# Patient Record
Sex: Female | Born: 1970 | Race: Black or African American | Hispanic: No | Marital: Single | Smoking: Current every day smoker
Health system: Southern US, Community
[De-identification: ages and names within clinical notes are randomized; demographics above are authoritative.]

## PROBLEM LIST (undated history)

## (undated) DIAGNOSIS — A539 Syphilis, unspecified: Secondary | ICD-10-CM | POA: Insufficient documentation

## (undated) DIAGNOSIS — M545 Low back pain, unspecified: Secondary | ICD-10-CM

## (undated) DIAGNOSIS — M1990 Unspecified osteoarthritis, unspecified site: Secondary | ICD-10-CM

## (undated) DIAGNOSIS — K439 Ventral hernia without obstruction or gangrene: Secondary | ICD-10-CM

## (undated) DIAGNOSIS — R569 Unspecified convulsions: Secondary | ICD-10-CM

## (undated) DIAGNOSIS — E119 Type 2 diabetes mellitus without complications: Secondary | ICD-10-CM

## (undated) DIAGNOSIS — G8929 Other chronic pain: Secondary | ICD-10-CM

## (undated) DIAGNOSIS — F1290 Cannabis use, unspecified, uncomplicated: Secondary | ICD-10-CM

## (undated) DIAGNOSIS — Z8659 Personal history of other mental and behavioral disorders: Secondary | ICD-10-CM

## (undated) DIAGNOSIS — D649 Anemia, unspecified: Secondary | ICD-10-CM

## (undated) DIAGNOSIS — F319 Bipolar disorder, unspecified: Secondary | ICD-10-CM

## (undated) DIAGNOSIS — G894 Chronic pain syndrome: Secondary | ICD-10-CM

## (undated) DIAGNOSIS — Z720 Tobacco use: Secondary | ICD-10-CM

## (undated) DIAGNOSIS — L732 Hidradenitis suppurativa: Secondary | ICD-10-CM

## (undated) DIAGNOSIS — Z9151 Personal history of suicidal behavior: Secondary | ICD-10-CM

## (undated) DIAGNOSIS — K219 Gastro-esophageal reflux disease without esophagitis: Secondary | ICD-10-CM

## (undated) DIAGNOSIS — Z87442 Personal history of urinary calculi: Secondary | ICD-10-CM

## (undated) DIAGNOSIS — Z046 Encounter for general psychiatric examination, requested by authority: Secondary | ICD-10-CM

## (undated) HISTORY — DX: Other chronic pain: G89.29

## (undated) HISTORY — PX: TUBAL LIGATION: SHX77

## (undated) HISTORY — PX: HYDRADENITIS EXCISION: SHX5243

## (undated) HISTORY — PX: DILATION AND CURETTAGE OF UTERUS: SHX78

## (undated) HISTORY — DX: Low back pain, unspecified: M54.50

---

## 2014-02-16 ENCOUNTER — Emergency Department: Payer: Self-pay | Admitting: Emergency Medicine

## 2014-02-20 ENCOUNTER — Emergency Department: Payer: Self-pay | Admitting: Emergency Medicine

## 2014-03-02 ENCOUNTER — Emergency Department: Payer: Self-pay | Admitting: Emergency Medicine

## 2014-03-16 ENCOUNTER — Emergency Department: Payer: Self-pay | Admitting: Emergency Medicine

## 2014-03-19 ENCOUNTER — Emergency Department: Payer: Self-pay | Admitting: Emergency Medicine

## 2014-03-21 ENCOUNTER — Emergency Department: Payer: Self-pay | Admitting: Emergency Medicine

## 2014-03-29 ENCOUNTER — Emergency Department: Payer: Self-pay | Admitting: Emergency Medicine

## 2014-04-04 ENCOUNTER — Emergency Department: Payer: Self-pay | Admitting: Emergency Medicine

## 2014-04-19 ENCOUNTER — Emergency Department: Payer: Self-pay | Admitting: Emergency Medicine

## 2014-04-26 ENCOUNTER — Emergency Department: Payer: Self-pay | Admitting: Internal Medicine

## 2014-05-06 ENCOUNTER — Emergency Department: Payer: Self-pay | Admitting: Emergency Medicine

## 2014-06-24 ENCOUNTER — Emergency Department: Payer: Self-pay | Admitting: Emergency Medicine

## 2014-07-09 ENCOUNTER — Emergency Department: Payer: Self-pay | Admitting: Student

## 2014-07-28 ENCOUNTER — Emergency Department: Payer: Self-pay | Admitting: Emergency Medicine

## 2014-08-04 ENCOUNTER — Emergency Department: Payer: Self-pay | Admitting: Emergency Medicine

## 2014-08-24 ENCOUNTER — Emergency Department: Payer: Self-pay | Admitting: Internal Medicine

## 2014-08-28 LAB — WOUND CULTURE

## 2014-11-11 ENCOUNTER — Emergency Department: Payer: Self-pay | Admitting: Emergency Medicine

## 2014-12-02 ENCOUNTER — Emergency Department
Admit: 2014-12-02 | Disposition: A | Discharge status: Home / Self Care | Payer: Self-pay | Admitting: Emergency Medicine

## 2014-12-08 LAB — WOUND CULTURE

## 2014-12-22 ENCOUNTER — Emergency Department
Admit: 2014-12-22 | Disposition: A | Discharge status: Home / Self Care | Payer: Self-pay | Admitting: Emergency Medicine

## 2015-01-22 ENCOUNTER — Other Ambulatory Visit: Payer: Self-pay

## 2015-01-22 ENCOUNTER — Emergency Department
Admission: EM | Admit: 2015-01-22 | Discharge: 2015-01-22 | Disposition: A | Discharge status: Home / Self Care | Payer: Medicaid Other | Attending: Emergency Medicine | Admitting: Emergency Medicine

## 2015-01-22 DIAGNOSIS — R079 Chest pain, unspecified: Secondary | ICD-10-CM | POA: Diagnosis present

## 2015-01-22 LAB — CBC
HCT: 36.8 % (ref 35.0–47.0)
Hemoglobin: 12.1 g/dL (ref 12.0–16.0)
MCH: 28.9 pg (ref 26.0–34.0)
MCHC: 32.8 g/dL (ref 32.0–36.0)
MCV: 87.9 fL (ref 80.0–100.0)
PLATELETS: 256 10*3/uL (ref 150–440)
RBC: 4.19 MIL/uL (ref 3.80–5.20)
RDW: 14.1 % (ref 11.5–14.5)
WBC: 4.6 10*3/uL (ref 3.6–11.0)

## 2015-01-22 LAB — BASIC METABOLIC PANEL
ANION GAP: 6 (ref 5–15)
BUN: 21 mg/dL — ABNORMAL HIGH (ref 6–20)
CHLORIDE: 108 mmol/L (ref 101–111)
CO2: 27 mmol/L (ref 22–32)
Calcium: 9.3 mg/dL (ref 8.9–10.3)
Creatinine, Ser: 0.92 mg/dL (ref 0.44–1.00)
GFR calc Af Amer: >60 mL/min (ref >60)
GLUCOSE: 86 mg/dL (ref 65–99)
Potassium: 3.3 mmol/L — ABNORMAL LOW (ref 3.5–5.1)
Sodium: 141 mmol/L (ref 135–145)

## 2015-01-22 LAB — TROPONIN I: Troponin I: <0.03 ng/mL (ref <0.031)

## 2015-01-22 MED ORDER — ACETAMINOPHEN 325 MG PO TABS: 650.0000 mg | ORAL_TABLET | Freq: Once | ORAL | Status: DC

## 2015-01-22 MED ORDER — GI COCKTAIL ~~LOC~~: 30.0000 mL | Freq: Once | ORAL | Status: DC

## 2015-01-22 MED ORDER — GI COCKTAIL ~~LOC~~
ORAL | Status: AC
  Filled 2015-01-22: qty 30

## 2015-01-22 MED ORDER — ACETAMINOPHEN 325 MG PO TABS
ORAL_TABLET | ORAL | Status: AC
  Filled 2015-01-22: qty 2

## 2015-01-22 NOTE — ED Provider Notes (Signed)
[facility] Emergency Department Provider Note   ____________________________________________  Time seen: EMS arrival  I have reviewed the triage vital signs and the nursing notes.   HISTORY  Chief Complaint Chest Pain   History limited by: Not Limited   HPI Karina Anderson is a 44 y.o. female presents to the emergency department via EMS today because of chest pain. Patient states that this pain started upon awakening this morning. She states that it has been constant since then. The pain is located retrosternally. It is sharp in nature. Patient rates the pain as a 10.5 on the 0-10 scale. Patient denies any radiation. Patient denies any shortness of breath. Patient denies any diaphoresis.Patient refused aspiring by EMS.     No past medical history on file.  There are no active problems to display for this patient.   No past surgical history on file.  No current outpatient prescriptions on file.  Allergies Review of patient's allergies indicates not on file.  No family history on file.  Social History History  Substance Use Topics  . Smoking status: Not on file  . Smokeless tobacco: Not on file  . Alcohol Use: Not on file    Review of Systems  Constitutional: Negative for fever. Cardiovascular: Positive for chest pain. Respiratory: Negative for shortness of breath. Gastrointestinal: Negative for abdominal pain, vomiting and diarrhea. Genitourinary: Negative for dysuria. Musculoskeletal: Negative for back pain. Skin: Negative for rash. Neurological: Negative for headaches, focal weakness or numbness.  10-point ROS otherwise negative.  ____________________________________________   PHYSICAL EXAM:   Constitutional: Alert and oriented. Well appearing and in no distress. Eyes: Conjunctivae are normal. PERRL. Normal extraocular movements. ENT   Head: Normocephalic and atraumatic.   Nose: No congestion/rhinnorhea.    Mouth/Throat: Mucous membranes are moist.   Neck: No stridor. Hematological/Lymphatic/Immunilogical: No cervical lymphadenopathy. Cardiovascular: Normal rate, regular rhythm.  No murmurs, rubs, or gallops. Respiratory: Normal respiratory effort without tachypnea nor retractions. Breath sounds are clear and equal bilaterally. No wheezes/rales/rhonchi. Gastrointestinal: Soft and nontender. No distention. Genitourinary: Deferred Musculoskeletal: Normal range of motion in all extremities. No joint effusions.  No lower extremity tenderness nor edema. Neurologic:  Normal speech and language. No gross focal neurologic deficits are appreciated. Speech is normal.  Skin:  Skin is warm, dry and intact. No rash noted. Psychiatric: Mood and affect are normal. Speech and behavior are normal. Patient exhibits appropriate insight and judgment.  ____________________________________________    LABS (pertinent positives/negatives)  Labs Reviewed  BASIC METABOLIC PANEL - Abnormal; Notable for the following:    Potassium 3.3 (*)    BUN 21 (*)    All other components within normal limits  CBC  TROPONIN I     ____________________________________________   EKG  I, Phineas Semen, attending physician, personally viewed and interpreted this EKG  EKG Time: 1625 Rate: 56 Rhythm: Sinus bradycardia Axis: Normal Intervals: QTc 405 QRS: Normal ST changes: No ST elevation   ____________________________________________    RADIOLOGY  None  ____________________________________________   PROCEDURES  Procedure(s) performed: None  Critical Care performed: No  ____________________________________________   INITIAL IMPRESSION / ASSESSMENT AND PLAN / ED COURSE  Pertinent labs & imaging results that were available during my care of the patient were reviewed by me and considered in my medical decision making (see chart for details).  She presented today from EMS because of substernal chest  pain. On exam patient. Very comfortable. EKG without any concerning findings. Additionally blood work and physical exam without any concerning findings.  Patient did refuse multiple medications both by EMS as well as by myself. I tried to discuss with patient that these medications were intended to make her feel better. However patient was insistent that it was not coming from her stomach and her sopping us and that it was her heart. I tried to explain to patient the reassuring nature of her EKG and blood work.  ____________________________________________   FINAL CLINICAL IMPRESSION(S) / ED DIAGNOSES  Final diagnoses:  Chest pain, unspecified chest pain type     Phineas SemenGraydon Kito Cuffe, MD 01/22/15 1842

## 2015-01-22 NOTE — ED Notes (Signed)
In to give pt meds. Pt refused meds at this time. Pt keeps stating that its chest pain.  It was explained to pt that sometimes when you have acid reflux it can make your chest hurt. Pt still refused to take meds and started yelling at staff that we were going to let her died. Provider made aware that pt was refusing meds.

## 2015-01-22 NOTE — ED Notes (Signed)
Pt here by EMS with CP that started this am when she got out of bed.  Pt states that has had some vomiting.Pt states that the pain is better when she lefts her right arm over her head. Pt was placed on the monitor and pt is in NAD at this time. EMS attempted to give the pt ASA and pt refused.

## 2015-01-22 NOTE — Discharge Instructions (Signed)
Please seek medical attention for any high fevers, chest pain, shortness of breath, change in behavior, persistent vomiting, bloody stool or any other new or concerning symptoms. ° °Chest Pain (Nonspecific) °It is often hard to give a specific diagnosis for the cause of chest pain. There is always a chance that your pain could be related to something serious, such as a heart attack or a blood clot in the lungs. You need to follow up with your health care provider for further evaluation. °CAUSES  °· Heartburn. °· Pneumonia or bronchitis. °· Anxiety or stress. °· Inflammation around your heart (pericarditis) or lung (pleuritis or pleurisy). °· A blood clot in the lung. °· A collapsed lung (pneumothorax). It can develop suddenly on its own (spontaneous pneumothorax) or from trauma to the chest. °· Shingles infection (herpes zoster virus). °The chest wall is composed of bones, muscles, and cartilage. Any of these can be the source of the pain. °· The bones can be bruised by injury. °· The muscles or cartilage can be strained by coughing or overwork. °· The cartilage can be affected by inflammation and become sore (costochondritis). °DIAGNOSIS  °Lab tests or other studies may be needed to find the cause of your pain. Your health care provider may have you take a test called an ambulatory electrocardiogram (ECG). An ECG records your heartbeat patterns over a 24-hour period. You may also have other tests, such as: °· Transthoracic echocardiogram (TTE). During echocardiography, sound waves are used to evaluate how blood flows through your heart. °· Transesophageal echocardiogram (TEE). °· Cardiac monitoring. This allows your health care provider to monitor your heart rate and rhythm in real time. °· Holter monitor. This is a portable device that records your heartbeat and can help diagnose heart arrhythmias. It allows your health care provider to track your heart activity for several days, if needed. °· Stress tests by  exercise or by giving medicine that makes the heart beat faster. °TREATMENT  °· Treatment depends on what may be causing your chest pain. Treatment may include: °¨ Acid blockers for heartburn. °¨ Anti-inflammatory medicine. °¨ Pain medicine for inflammatory conditions. °¨ Antibiotics if an infection is present. °· You may be advised to change lifestyle habits. This includes stopping smoking and avoiding alcohol, caffeine, and chocolate. °· You may be advised to keep your head raised (elevated) when sleeping. This reduces the chance of acid going backward from your stomach into your esophagus. °Most of the time, nonspecific chest pain will improve within 2-3 days with rest and mild pain medicine.  °HOME CARE INSTRUCTIONS  °· If antibiotics were prescribed, take them as directed. Finish them even if you start to feel better. °· For the next few days, avoid physical activities that bring on chest pain. Continue physical activities as directed. °· Do not use any tobacco products, including cigarettes, chewing tobacco, or electronic cigarettes. °· Avoid drinking alcohol. °· Only take medicine as directed by your health care provider. °· Follow your health care provider's suggestions for further testing if your chest pain does not go away. °· Keep any follow-up appointments you made. If you do not go to an appointment, you could develop lasting (chronic) problems with pain. If there is any problem keeping an appointment, call to reschedule. °SEEK MEDICAL CARE IF:  °· Your chest pain does not go away, even after treatment. °· You have a rash with blisters on your chest. °· You have a fever. °SEEK IMMEDIATE MEDICAL CARE IF:  °· You have increased chest   pain or pain that spreads to your arm, neck, jaw, back, or abdomen. °· You have shortness of breath. °· You have an increasing cough, or you cough up blood. °· You have severe back or abdominal pain. °· You feel nauseous or vomit. °· You have severe weakness. °· You  faint. °· You have chills. °This is an emergency. Do not wait to see if the pain will go away. Get medical help at once. Call your local emergency services (911 in [place] Do not drive yourself to the hospital. °MAKE SURE YOU:  °· Understand these instructions. °· Will watch your condition. °· Will get help right away if you are not doing well or get worse. °Document Released: 05/25/2005 Document Revised: 08/20/2013 Document Reviewed: 03/20/2008 °[employer]® Patient Information ©2015 [employer]. This information is not intended to replace advice given to you by your health care provider. Make sure you discuss any questions you have with your health care provider. ° °

## 2015-01-22 NOTE — ED Notes (Signed)
Pt here by EMS with midsternal CP, see triage for details

## 2015-09-30 ENCOUNTER — Encounter: Payer: Self-pay | Admitting: Emergency Medicine

## 2015-09-30 ENCOUNTER — Emergency Department
Admission: EM | Admit: 2015-09-30 | Discharge: 2015-09-30 | Disposition: A | Discharge status: Home / Self Care | Payer: Medicaid Other | Attending: Emergency Medicine | Admitting: Emergency Medicine

## 2015-09-30 DIAGNOSIS — Z3202 Encounter for pregnancy test, result negative: Secondary | ICD-10-CM | POA: Diagnosis not present

## 2015-09-30 DIAGNOSIS — R1031 Right lower quadrant pain: Secondary | ICD-10-CM | POA: Diagnosis not present

## 2015-09-30 DIAGNOSIS — R110 Nausea: Secondary | ICD-10-CM | POA: Insufficient documentation

## 2015-09-30 HISTORY — DX: Unspecified convulsions: R56.9

## 2015-09-30 LAB — COMPREHENSIVE METABOLIC PANEL
ALK PHOS: 75 U/L (ref 38–126)
ALT: 14 U/L (ref 14–54)
AST: 16 U/L (ref 15–41)
Albumin: 4.1 g/dL (ref 3.5–5.0)
Anion gap: 6 (ref 5–15)
BUN: 15 mg/dL (ref 6–20)
CALCIUM: 9.2 mg/dL (ref 8.9–10.3)
CHLORIDE: 108 mmol/L (ref 101–111)
CO2: 27 mmol/L (ref 22–32)
CREATININE: 0.71 mg/dL (ref 0.44–1.00)
GFR calc Af Amer: >60 mL/min (ref >60)
Glucose, Bld: 92 mg/dL (ref 65–99)
Potassium: 3.4 mmol/L — ABNORMAL LOW (ref 3.5–5.1)
Sodium: 141 mmol/L (ref 135–145)
Total Bilirubin: 0.7 mg/dL (ref 0.3–1.2)
Total Protein: 7.5 g/dL (ref 6.5–8.1)

## 2015-09-30 LAB — CBC
HEMATOCRIT: 36.4 % (ref 35.0–47.0)
HEMOGLOBIN: 12.3 g/dL (ref 12.0–16.0)
MCH: 30.1 pg (ref 26.0–34.0)
MCHC: 33.7 g/dL (ref 32.0–36.0)
MCV: 89.2 fL (ref 80.0–100.0)
Platelets: 231 10*3/uL (ref 150–440)
RBC: 4.08 MIL/uL (ref 3.80–5.20)
RDW: 13.9 % (ref 11.5–14.5)
WBC: 4.5 10*3/uL (ref 3.6–11.0)

## 2015-09-30 LAB — URINALYSIS COMPLETE WITH MICROSCOPIC (ARMC ONLY)
Bilirubin Urine: NEGATIVE
Glucose, UA: NEGATIVE mg/dL
Ketones, ur: NEGATIVE mg/dL
NITRITE: NEGATIVE
PH: 5 (ref 5.0–8.0)
PROTEIN: NEGATIVE mg/dL
SPECIFIC GRAVITY, URINE: 1.019 (ref 1.005–1.030)

## 2015-09-30 LAB — LIPASE, BLOOD: Lipase: 15 U/L (ref 11–51)

## 2015-09-30 LAB — POCT PREGNANCY, URINE: Preg Test, Ur: NEGATIVE

## 2015-09-30 NOTE — ED Notes (Signed)
Pt called twice, no answer, pt left after triage

## 2015-09-30 NOTE — ED Notes (Signed)
Pt to ed with c/o right lower abd pain x 2 weeks, reports nausea, denies n/d.  lmp 2 weeks ago, last bm was 2 days ago and wnl.

## 2015-10-01 ENCOUNTER — Encounter: Payer: Self-pay | Admitting: Emergency Medicine

## 2015-10-01 ENCOUNTER — Emergency Department: Payer: Medicaid Other

## 2015-10-01 ENCOUNTER — Emergency Department
Admission: EM | Admit: 2015-10-01 | Discharge: 2015-10-01 | Disposition: A | Discharge status: Home / Self Care | Payer: Medicaid Other | Attending: Emergency Medicine | Admitting: Emergency Medicine

## 2015-10-01 DIAGNOSIS — R1031 Right lower quadrant pain: Secondary | ICD-10-CM | POA: Diagnosis present

## 2015-10-01 DIAGNOSIS — N23 Unspecified renal colic: Secondary | ICD-10-CM | POA: Diagnosis not present

## 2015-10-01 DIAGNOSIS — F17210 Nicotine dependence, cigarettes, uncomplicated: Secondary | ICD-10-CM | POA: Insufficient documentation

## 2015-10-01 DIAGNOSIS — Z79899 Other long term (current) drug therapy: Secondary | ICD-10-CM | POA: Insufficient documentation

## 2015-10-01 DIAGNOSIS — R109 Unspecified abdominal pain: Secondary | ICD-10-CM

## 2015-10-01 LAB — URINALYSIS COMPLETE WITH MICROSCOPIC (ARMC ONLY)
Bilirubin Urine: NEGATIVE
Glucose, UA: NEGATIVE mg/dL
HGB URINE DIPSTICK: NEGATIVE
Ketones, ur: NEGATIVE mg/dL
Nitrite: NEGATIVE
PROTEIN: NEGATIVE mg/dL
Specific Gravity, Urine: 1.024 (ref 1.005–1.030)
pH: 5 (ref 5.0–8.0)

## 2015-10-01 LAB — COMPREHENSIVE METABOLIC PANEL
ALT: 19 U/L (ref 14–54)
AST: 23 U/L (ref 15–41)
Albumin: 4.1 g/dL (ref 3.5–5.0)
Alkaline Phosphatase: 81 U/L (ref 38–126)
Anion gap: 5 (ref 5–15)
BUN: 14 mg/dL (ref 6–20)
CHLORIDE: 107 mmol/L (ref 101–111)
CO2: 27 mmol/L (ref 22–32)
Calcium: 9 mg/dL (ref 8.9–10.3)
Creatinine, Ser: 0.66 mg/dL (ref 0.44–1.00)
GFR calc Af Amer: >60 mL/min (ref >60)
GFR calc non Af Amer: >60 mL/min (ref >60)
GLUCOSE: 85 mg/dL (ref 65–99)
POTASSIUM: 3.7 mmol/L (ref 3.5–5.1)
SODIUM: 139 mmol/L (ref 135–145)
Total Bilirubin: 0.7 mg/dL (ref 0.3–1.2)
Total Protein: 7.7 g/dL (ref 6.5–8.1)

## 2015-10-01 LAB — CBC
HEMATOCRIT: 36.7 % (ref 35.0–47.0)
HEMOGLOBIN: 12.2 g/dL (ref 12.0–16.0)
MCH: 29.8 pg (ref 26.0–34.0)
MCHC: 33.3 g/dL (ref 32.0–36.0)
MCV: 89.4 fL (ref 80.0–100.0)
Platelets: 242 10*3/uL (ref 150–440)
RBC: 4.11 MIL/uL (ref 3.80–5.20)
RDW: 13.6 % (ref 11.5–14.5)
WBC: 4.7 10*3/uL (ref 3.6–11.0)

## 2015-10-01 LAB — LIPASE, BLOOD: LIPASE: 19 U/L (ref 11–51)

## 2015-10-01 MED ORDER — CIPROFLOXACIN HCL 500 MG PO TABS: 500.0000 mg | ORAL_TABLET | Freq: Two times a day (BID) | ORAL | Status: AC

## 2015-10-01 MED ORDER — ONDANSETRON HCL 4 MG/2ML IJ SOLN
4.0000 mg | Freq: Once | INTRAMUSCULAR | Status: AC
  Administered 2015-10-01: 4 mg via INTRAVENOUS
  Filled 2015-10-01: qty 2

## 2015-10-01 MED ORDER — KETOROLAC TROMETHAMINE 30 MG/ML IJ SOLN
30.0000 mg | Freq: Once | INTRAMUSCULAR | Status: AC
  Administered 2015-10-01: 30 mg via INTRAVENOUS
  Filled 2015-10-01: qty 1

## 2015-10-01 MED ORDER — ONDANSETRON HCL 4 MG PO TABS: 4.0000 mg | ORAL_TABLET | Freq: Three times a day (TID) | ORAL | Status: DC | PRN

## 2015-10-01 MED ORDER — HYDROCODONE-ACETAMINOPHEN 5-325 MG PO TABS: 1.0000 | ORAL_TABLET | Freq: Four times a day (QID) | ORAL | Status: DC | PRN

## 2015-10-01 NOTE — ED Notes (Signed)
Pt ambulatory to room with no difficulty or distress noted.

## 2015-10-01 NOTE — ED Notes (Signed)
Pt here with c/o right flank pain and RLQ pain that began about 3 weeks ago and worsening the past few days. Kidney stones in her family per pt. Was triaged here yesterday and LWBS due to wait time. Pt appears in no distress.

## 2015-10-01 NOTE — ED Provider Notes (Signed)
Time Seen: Approximately 0 950  I have reviewed the triage notes  Chief Complaint: Flank Pain and Abdominal Pain   History of Present Illness: Karina Anderson is a 45 y.o. female who states that she's had some right lower quadrant pain now for the past 3 weeks. She states at home she has difficulty finding a comfortable position mainly lies on her right side. She describes some nausea with no persistent vomiting. She denies any vaginal discharge or bleeding. Patient was here yesterday and had laboratory work performed and a urinalysis, etc. and left AGAINST MEDICAL ADVICE. The patient states her pain really hasn't changed in location or intensity over the last 24 hours and pain will radiate toward her right flank area. She denies any melena, hematochezia, diarrhea, constipation.  Past Medical History  Diagnosis Date  . Seizures (HCC)     There are no active problems to display for this patient.   History reviewed. No pertinent past surgical history.  History reviewed. No pertinent past surgical history.  Current Outpatient Rx  Name  Route  Sig  Dispense  Refill  . phenytoin (DILANTIN) 100 MG ER capsule   Oral   Take 1 capsule by mouth 3 (three) times daily.         . ciprofloxacin (CIPRO) 500 MG tablet   Oral   Take 1 tablet (500 mg total) by mouth 2 (two) times daily.   14 tablet   0   . HYDROcodone-acetaminophen (NORCO) 5-325 MG tablet   Oral   Take 1 tablet by mouth every 6 (six) hours as needed for moderate pain.   20 tablet   0   . ondansetron (ZOFRAN) 4 MG tablet   Oral   Take 1 tablet (4 mg total) by mouth every 8 (eight) hours as needed for nausea or vomiting.   21 tablet   0     Allergies:  Bactrim; Butorphanol tartrate; Food; Ibuprofen; Ketorolac; Lorazepam; Naproxen; Nsaids; and Prochlorperazine  Family History: No family history on file.  Social History: Social History  Substance Use Topics  . Smoking status: Current Every Day Smoker -- 0.50  packs/day    Types: Cigarettes  . Smokeless tobacco: None  . Alcohol Use: No     Review of Systems:   10 point review of systems was performed and was otherwise negative:  Constitutional: No fever Eyes: No visual disturbances ENT: No sore throat, ear pain Cardiac: No chest pain Respiratory: No shortness of breath, wheezing, or stridor Abdomen: Patient points mainly to the right lower quadrant and states the pain will go to the right flank region Endocrine: No weight loss, No night sweats Extremities: No peripheral edema, cyanosis Skin: No rashes, easy bruising Neurologic: No focal weakness, trouble with speech or swollowing Urologic: No dysuria, Hematuria, or urinary frequency   Physical Exam:  ED Triage Vitals  Enc Vitals Group     BP 10/01/15 0859 106/79 mmHg     Pulse Rate 10/01/15 0859 79     Resp 10/01/15 0859 18     Temp 10/01/15 0859 98 F (36.7 C)     Temp Source 10/01/15 0859 Oral     SpO2 10/01/15 0859 99 %     Weight 10/01/15 0859 145 lb (65.772 kg)     Height 10/01/15 0859  (1.651 m)     Head Cir --      Peak Flow --      Pain Score 10/01/15 0859 10     Pain  Loc --      Pain Edu? --      Excl. in GC? --     General: Awake , Alert , and Oriented times 3; GCS 15 Head: Normal cephalic , atraumatic Eyes: Pupils equal , round, reactive to light Nose/Throat: No nasal drainage, patent upper airway without erythema or exudate.  Neck: Supple, Full range of motion, No anterior adenopathy or palpable thyroid masses Lungs: Clear to ascultation without wheezes , rhonchi, or rales Heart: Regular rate, regular rhythm without murmurs , gallops , or rubs Abdomen: Soft, non tender without rebound, guarding , or rigidity; bowel sounds positive and symmetric in all 4 quadrants. No organomegaly .      Negative tenderness over McBurney's point, negative Murphy's sign  Extremities: 2 plus symmetric pulses. No edema, clubbing or cyanosis Neurologic: normal ambulation,  Motor symmetric without deficits, sensory intact Skin: warm, dry, no rashes   Labs:   All laboratory work was reviewed including any pertinent negatives or positives listed below:  Labs Reviewed  URINALYSIS COMPLETEWITH MICROSCOPIC ([facility] ONLY) - Abnormal; Notable for the following:    Color, Urine YELLOW (*)    APPearance CLEAR (*)    Leukocytes, UA TRACE (*)    Bacteria, UA RARE (*)    Squamous Epithelial / LPF 0-5 (*)    All other components within normal limits  LIPASE, BLOOD  COMPREHENSIVE METABOLIC PANEL  CBC   review laboratory work showed some findings consistent with urinary tract infection   Radiology: EXAM: CT ABDOMEN AND PELVIS WITHOUT CONTRAST  TECHNIQUE: Multidetector CT imaging of the abdomen and pelvis was performed following the standard protocol without IV contrast.  COMPARISON: None.  FINDINGS: The lung bases are free of acute infiltrate or sizable effusion.  The liver, gallbladder, spleen, adrenal glands and pancreas are within normal limits. The kidneys are well visualized and within normal limits. No renal calculi or obstructive changes are seen.  Changes of prior tubal ligation are noted. The bladder is decompressed. In the expected region of the distal right ureter there is a 3 mm calcification identified on image number 73 of series 2. This likely represents an intermittently obstructing distal right ureteral stone. Retrograde pyelography may be helpful for further evaluation. No free pelvic fluid is seen. The uterus is within normal limits. The appendix is not well visualized. No inflammatory changes to suggest appendicitis are seen. No bony abnormality is noted.  IMPRESSION: Changes suggestive of a a nonobstructing distal right ureteral stone. This may cause intermittent symptoms and further evaluation is recommended.  Nonvisualization of the appendix. No inflammatory changes are seen.      I personally reviewed the radiologic  studies    ED Course: Differential diagnosis includes but is not exclusive to ovarian cyst, ovarian torsion, acute appendicitis, urinary tract infection, endometriosis, bowel obstruction, colitis, renal colic, gastroenteritis, etc. Given the patient's current clinical presentation and objective findings I felt that the patient's pain was most likely from renal colic based on her CAT scan evaluation the fact that her pain is not really reproducible on physical exam. She received Toradol along with Zofran with symptomatic improvement. She was referred to urology unassigned. He started on oral antibiotics secondary to the right blood cells seen in the urine at this time.    Assessment: * Acute renal colic   Final Clinical Impression:  Final diagnoses:  Right flank pain     Plan:  Outpatient management Patient was advised to return immediately if condition worsens. Patient was advised  to follow up with their primary care physician or other specialized physicians involved in their outpatient care            Jennye Moccasin, MD 10/01/15 1644

## 2015-10-01 NOTE — ED Notes (Signed)
Pt gave urine and blood here yesterday.

## 2015-10-01 NOTE — ED Notes (Signed)
Pt to CT

## 2015-11-24 ENCOUNTER — Emergency Department: Payer: Medicaid Other

## 2015-11-24 ENCOUNTER — Emergency Department
Admission: EM | Admit: 2015-11-24 | Discharge: 2015-11-24 | Disposition: A | Discharge status: Home / Self Care | Payer: Medicaid Other | Attending: Emergency Medicine | Admitting: Emergency Medicine

## 2015-11-24 ENCOUNTER — Encounter: Payer: Self-pay | Admitting: Emergency Medicine

## 2015-11-24 DIAGNOSIS — Z79899 Other long term (current) drug therapy: Secondary | ICD-10-CM | POA: Insufficient documentation

## 2015-11-24 DIAGNOSIS — R109 Unspecified abdominal pain: Secondary | ICD-10-CM | POA: Diagnosis not present

## 2015-11-24 DIAGNOSIS — F17210 Nicotine dependence, cigarettes, uncomplicated: Secondary | ICD-10-CM | POA: Insufficient documentation

## 2015-11-24 HISTORY — DX: Type 2 diabetes mellitus without complications: E11.9

## 2015-11-24 LAB — CBC
HCT: 37.4 % (ref 35.0–47.0)
Hemoglobin: 12.4 g/dL (ref 12.0–16.0)
MCH: 29.5 pg (ref 26.0–34.0)
MCHC: 33.2 g/dL (ref 32.0–36.0)
MCV: 88.9 fL (ref 80.0–100.0)
PLATELETS: 251 10*3/uL (ref 150–440)
RBC: 4.21 MIL/uL (ref 3.80–5.20)
RDW: 13.1 % (ref 11.5–14.5)
WBC: 4.6 10*3/uL (ref 3.6–11.0)

## 2015-11-24 LAB — URINALYSIS COMPLETE WITH MICROSCOPIC (ARMC ONLY)
BACTERIA UA: NONE SEEN
SPECIFIC GRAVITY, URINE: 1.027 (ref 1.005–1.030)

## 2015-11-24 LAB — COMPREHENSIVE METABOLIC PANEL
ALT: 9 U/L — ABNORMAL LOW (ref 14–54)
ANION GAP: 4 — AB (ref 5–15)
AST: 14 U/L — AB (ref 15–41)
Albumin: 4 g/dL (ref 3.5–5.0)
Alkaline Phosphatase: 65 U/L (ref 38–126)
BILIRUBIN TOTAL: 0.6 mg/dL (ref 0.3–1.2)
BUN: 18 mg/dL (ref 6–20)
CHLORIDE: 109 mmol/L (ref 101–111)
CO2: 27 mmol/L (ref 22–32)
Calcium: 9.1 mg/dL (ref 8.9–10.3)
Creatinine, Ser: 0.83 mg/dL (ref 0.44–1.00)
GFR calc Af Amer: >60 mL/min (ref >60)
Glucose, Bld: 86 mg/dL (ref 65–99)
POTASSIUM: 3.3 mmol/L — AB (ref 3.5–5.1)
Sodium: 140 mmol/L (ref 135–145)
Total Protein: 7.5 g/dL (ref 6.5–8.1)

## 2015-11-24 MED ORDER — TRAMADOL HCL 50 MG PO TABS: 50.0000 mg | ORAL_TABLET | Freq: Four times a day (QID) | ORAL | Status: DC | PRN

## 2015-11-24 MED ORDER — DIATRIZOATE MEGLUMINE & SODIUM 66-10 % PO SOLN
15.0000 mL | Freq: Once | ORAL | Status: AC
  Administered 2015-11-24: 15 mL via ORAL

## 2015-11-24 MED ORDER — IOPAMIDOL (ISOVUE-300) INJECTION 61%
100.0000 mL | Freq: Once | INTRAVENOUS | Status: AC | PRN
  Administered 2015-11-24: 100 mL via INTRAVENOUS

## 2015-11-24 MED ORDER — SODIUM CHLORIDE 0.9 % IV BOLUS (SEPSIS)
1000.0000 mL | Freq: Once | INTRAVENOUS | Status: AC
  Administered 2015-11-24: 1000 mL via INTRAVENOUS

## 2015-11-24 MED ORDER — ONDANSETRON HCL 4 MG/2ML IJ SOLN
4.0000 mg | Freq: Once | INTRAMUSCULAR | Status: AC
  Administered 2015-11-24: 4 mg via INTRAVENOUS
  Filled 2015-11-24: qty 2

## 2015-11-24 MED ORDER — MORPHINE SULFATE (PF) 4 MG/ML IV SOLN
4.0000 mg | Freq: Once | INTRAVENOUS | Status: AC
  Administered 2015-11-24: 4 mg via INTRAVENOUS
  Filled 2015-11-24: qty 1

## 2015-11-24 NOTE — Discharge Instructions (Signed)
Flank Pain °Flank pain refers to pain that is located on the side of the body between the upper abdomen and the back. The pain may occur over a short period of time (acute) or may be long-term or reoccurring (chronic). It may be mild or severe. Flank pain can be caused by many things. °CAUSES  °Some of the more common causes of flank pain include: °· Muscle strains.   °· Muscle spasms.   °· A disease of your spine (vertebral disk disease).   °· A lung infection (pneumonia).   °· Fluid around your lungs (pulmonary edema).   °· A kidney infection.   °· Kidney stones.   °· A very painful skin rash caused by the chickenpox virus (shingles).   °· Gallbladder disease.   °HOME CARE INSTRUCTIONS  °Home care will depend on the cause of your pain. In general, °· Rest as directed by your caregiver. °· Drink enough fluids to keep your urine clear or pale yellow. °· Only take over-the-counter or prescription medicines as directed by your caregiver. Some medicines may help relieve the pain. °· Tell your caregiver about any changes in your pain. °· Follow up with your caregiver as directed. °SEEK IMMEDIATE MEDICAL CARE IF:  °· Your pain is not controlled with medicine.   °· You have new or worsening symptoms. °· Your pain increases.   °· You have abdominal pain.   °· You have shortness of breath.   °· You have persistent nausea or vomiting.   °· You have swelling in your abdomen.   °· You feel faint or pass out.   °· You have blood in your urine. °· You have a fever or persistent symptoms for more than 2-3 days. °· You have a fever and your symptoms suddenly get worse. °MAKE SURE YOU:  °· Understand these instructions. °· Will watch your condition. °· Will get help right away if you are not doing well or get worse. °  °This information is not intended to replace advice given to you by your health care provider. Make sure you discuss any questions you have with your health care provider. °  °Document Released: 10/06/2005 Document  Revised: 05/09/2012 Document Reviewed: 03/29/2012 °[employer] Interactive Patient Education ©2016 [employer] ° °

## 2015-11-24 NOTE — ED Provider Notes (Signed)
[facility] Emergency Department Provider Note  ____________________________________________  Time seen: 5:00 AM  I have reviewed the triage vital signs and the nursing notes.   HISTORY  Chief Complaint Flank Pain     HPI Karina Anderson is a 45 y.o. female resents with 10 out of 10 left flank pain 4 days intermittently. Patient states that she was diagnosed with kidney stone on the right side a month ago here at [facility] however started experiencing left flank pain 4 days ago. Patient denies any fever. Patient admits to hematuria however she is currently on her menses and is unclear if that's the source of the hematuria.     Past Medical History  Diagnosis Date  . Seizures (HCC)     There are no active problems to display for this patient.   History reviewed. No pertinent past surgical history.  Current Outpatient Rx  Name  Route  Sig  Dispense  Refill  . HYDROcodone-acetaminophen (NORCO) 5-325 MG tablet   Oral   Take 1 tablet by mouth every 6 (six) hours as needed for moderate pain.   20 tablet   0   . ondansetron (ZOFRAN) 4 MG tablet   Oral   Take 1 tablet (4 mg total) by mouth every 8 (eight) hours as needed for nausea or vomiting.   21 tablet   0   . phenytoin (DILANTIN) 100 MG ER capsule   Oral   Take 1 capsule by mouth 3 (three) times daily.           Allergies Bactrim; Butorphanol tartrate; Food; Ibuprofen; Ketorolac; Lorazepam; Naproxen; Nsaids; and Prochlorperazine  No family history on file.  Social History Social History  Substance Use Topics  . Smoking status: Current Every Day Smoker -- 0.50 packs/day    Types: Cigarettes  . Smokeless tobacco: None  . Alcohol Use: No    Review of Systems  Constitutional: Negative for fever. Eyes: Negative for visual changes. ENT: Negative for sore throat. Cardiovascular: Negative for chest pain. Respiratory: Negative for shortness of breath. Gastrointestinal:  Negative for abdominal pain, vomiting and diarrhea. Genitourinary: Negative for dysuria. Musculoskeletal: Negative for back pain.Positive for left flank pain Skin: Negative for rash. Neurological: Negative for headaches, focal weakness or numbness.    10-point ROS otherwise negative.  ____________________________________________   PHYSICAL EXAM:  VITAL SIGNS: ED Triage Vitals  Enc Vitals Group     BP 11/24/15 0503 110/86 mmHg     Pulse Rate 11/24/15 0503 63     Resp 11/24/15 0503 18     Temp 11/24/15 0503 98.4 F (36.9 C)     Temp Source 11/24/15 0503 Oral     SpO2 11/24/15 0503 100 %     Weight 11/24/15 0515 155 lb (70.308 kg)     Height 11/24/15 0515 5\' 5"  (1.651 m)     Head Cir --      Peak Flow --      Pain Score 11/24/15 0504 10     Pain Loc --      Pain Edu? --      Excl. in GC? --      Constitutional: Alert and oriented. Apparent discomfort Eyes: Conjunctivae are normal. PERRL. Normal extraocular movements. ENT   Head: Normocephalic and atraumatic.   Nose: No congestion/rhinnorhea.   Mouth/Throat: Mucous membranes are moist.   Neck: No stridor. Hematological/Lymphatic/Immunilogical: No cervical lymphadenopathy. Cardiovascular: Normal rate, regular rhythm. Normal and symmetric distal pulses are present in all extremities. No murmurs,  rubs, or gallops. Respiratory: Normal respiratory effort without tachypnea nor retractions. Breath sounds are clear and equal bilaterally. No wheezes/rales/rhonchi. Gastrointestinal: Soft and nontender. No distention. There is no CVA tenderness. Genitourinary: deferred Musculoskeletal: Nontender with normal range of motion in all extremities. No joint effusions.  No lower extremity tenderness nor edema. Neurologic:  Normal speech and language. No gross focal neurologic deficits are appreciated. Speech is normal.  Skin:  Skin is warm, dry and intact. No rash noted. Psychiatric: Mood and affect are normal. Speech and  behavior are normal. Patient exhibits appropriate insight and judgment.  ____________________________________________    LABS (pertinent positives/negatives)  Labs Reviewed  COMPREHENSIVE METABOLIC PANEL - Abnormal; Notable for the following:    Potassium 3.3 (*)    AST 14 (*)    ALT 9 (*)    Anion gap 4 (*)    All other components within normal limits  URINALYSIS COMPLETEWITH MICROSCOPIC ([facility] ONLY) - Abnormal; Notable for the following:    Color, Urine RED (*)    APPearance CLOUDY (*)    Glucose, UA   (*)    Value: TEST NOT REPORTED DUE TO COLOR INTERFERENCE OF URINE PIGMENT   Bilirubin Urine   (*)    Value: TEST NOT REPORTED DUE TO COLOR INTERFERENCE OF URINE PIGMENT   Ketones, ur   (*)    Value: TEST NOT REPORTED DUE TO COLOR INTERFERENCE OF URINE PIGMENT   Hgb urine dipstick   (*)    Value: TEST NOT REPORTED DUE TO COLOR INTERFERENCE OF URINE PIGMENT   Protein, ur   (*)    Value: TEST NOT REPORTED DUE TO COLOR INTERFERENCE OF URINE PIGMENT   Nitrite   (*)    Value: TEST NOT REPORTED DUE TO COLOR INTERFERENCE OF URINE PIGMENT   Leukocytes, UA   (*)    Value: TEST NOT REPORTED DUE TO COLOR INTERFERENCE OF URINE PIGMENT   Squamous Epithelial / LPF 6-30 (*)    All other components within normal limits  CBC       RADIOLOGY  DG Abd 1 View (Final result) Result time: 11/24/15 05:13:33   Final result by Rad Results In Interface (11/24/15 05:13:33)   Narrative:   CLINICAL DATA: Acute onset of left flank pain and hematuria. Initial encounter.  EXAM: ABDOMEN - 1 VIEW  COMPARISON: CT of the abdomen and pelvis performed 10/01/2015  FINDINGS: No definite left-sided ureteral stones are seen. The renal shadows are difficult to fully assess due to overlying structures. The visualized bowel gas pattern is grossly unremarkable. No free intra-abdominal air is seen, though evaluation for free air is limited on supine views. Bilateral tubal ligation clips are noted. No  acute osseous abnormalities are identified.  IMPRESSION: No obstructing ureteral stones seen. Unremarkable bowel gas pattern. No free intra-abdominal air seen.   Electronically Signed By: Roanna Raider M.D. On: 11/24/2015 05:13          INITIAL IMPRESSION / ASSESSMENT AND PLAN / ED COURSE  Pertinent labs & imaging results that were available during my care of the patient were reviewed by me and considered in my medical decision making (see chart for details).  Patient received IV morphine and Zofran for pain and antiemetic emergency departmentWith resolution of pain.CT scan revealed no etiology for the patient's left flank pain.  ____________________________________________   FINAL CLINICAL IMPRESSION(S) / ED DIAGNOSES  Final diagnoses:  Left flank pain      Darci Current, MD 11/24/15 [phone]

## 2015-11-24 NOTE — ED Notes (Signed)
Assisted pt up to bathroom and back into bed. Pt ambulated with steady gait. 

## 2015-11-24 NOTE — ED Notes (Signed)
Pt back from CT

## 2015-11-24 NOTE — ED Notes (Signed)
Patient transported to CT via stretcher.

## 2015-11-24 NOTE — ED Notes (Addendum)
Pt presents to ED via [facility] from home c/o left sided flank pain x 4 days, pt reports has dx with kidney stone x 1 month. Pt denies fever or chest pain. Pt reports is currently on menses. Pt denies any urinary symptoms. Pt tender to left side flank upon palpation. Pt alert and oriented x 4, no increased work in breathing noted, skin warm and dry. Call bell within reach.

## 2015-11-24 NOTE — ED Notes (Signed)
Patient transported to X-ray via stretcher 

## 2015-11-24 NOTE — ED Notes (Signed)
Pt reports finished drinking contrast. CT tech notified.

## 2015-11-24 NOTE — ED Notes (Signed)
Pt reports unable to provide urine specimen and this time.

## 2016-03-04 ENCOUNTER — Encounter: Payer: Self-pay | Admitting: Emergency Medicine

## 2016-03-04 ENCOUNTER — Emergency Department
Admission: EM | Admit: 2016-03-04 | Discharge: 2016-03-04 | Disposition: A | Discharge status: Home / Self Care | Payer: Medicaid Other | Attending: Emergency Medicine | Admitting: Emergency Medicine

## 2016-03-04 DIAGNOSIS — E119 Type 2 diabetes mellitus without complications: Secondary | ICD-10-CM | POA: Diagnosis not present

## 2016-03-04 DIAGNOSIS — Z8669 Personal history of other diseases of the nervous system and sense organs: Secondary | ICD-10-CM | POA: Diagnosis not present

## 2016-03-04 DIAGNOSIS — F17210 Nicotine dependence, cigarettes, uncomplicated: Secondary | ICD-10-CM | POA: Insufficient documentation

## 2016-03-04 DIAGNOSIS — N898 Other specified noninflammatory disorders of vagina: Secondary | ICD-10-CM | POA: Diagnosis not present

## 2016-03-04 DIAGNOSIS — R102 Pelvic and perineal pain: Secondary | ICD-10-CM | POA: Diagnosis present

## 2016-03-04 LAB — CHLAMYDIA/NGC RT PCR (ARMC ONLY)
Chlamydia Tr: NOT DETECTED
N GONORRHOEAE: NOT DETECTED

## 2016-03-04 LAB — URINALYSIS COMPLETE WITH MICROSCOPIC (ARMC ONLY)
Glucose, UA: NEGATIVE mg/dL
Hgb urine dipstick: NEGATIVE
Ketones, ur: NEGATIVE mg/dL
Leukocytes, UA: NEGATIVE
Nitrite: NEGATIVE
PH: 5 (ref 5.0–8.0)
Protein, ur: 30 mg/dL — AB
Specific Gravity, Urine: 1.024 (ref 1.005–1.030)

## 2016-03-04 LAB — POCT PREGNANCY, URINE: PREG TEST UR: NEGATIVE

## 2016-03-04 LAB — WET PREP, GENITAL
Clue Cells Wet Prep HPF POC: NONE SEEN
SPERM: NONE SEEN
YEAST WET PREP: NONE SEEN

## 2016-03-04 LAB — ABO/RH: ABO/RH(D): O POS

## 2016-03-04 LAB — HCG, QUANTITATIVE, PREGNANCY: hCG, Beta Chain, Quant, S: <1 m[IU]/mL (ref <5)

## 2016-03-04 NOTE — ED Provider Notes (Signed)
[facility] Emergency Department Provider Note   ____________________________________________    I have reviewed the triage vital signs and the nursing notes.   HISTORY  Chief Complaint Vaginal Pain     HPI Karina Anderson is a 45 y.o. female who presents with complaints of discomfort in her vagina and discharge. She tells me that she is here because she put a stone in her vagina and she wants it removed. She told the triage nurse that she was concerned that she is pregnant. She denies abdominal pain. No fevers or chills. No nausea or vomiting. No dysuria.   Past Medical History  Diagnosis Date  . Seizures (HCC)   . Diabetes mellitus without complication (HCC)     There are no active problems to display for this patient.   History reviewed. No pertinent past surgical history.  Current Outpatient Rx  Name  Route  Sig  Dispense  Refill  . HYDROcodone-acetaminophen (NORCO) 5-325 MG tablet   Oral   Take 1 tablet by mouth every 6 (six) hours as needed for moderate pain.   20 tablet   0   . ondansetron (ZOFRAN) 4 MG tablet   Oral   Take 1 tablet (4 mg total) by mouth every 8 (eight) hours as needed for nausea or vomiting.   21 tablet   0   . phenytoin (DILANTIN) 100 MG ER capsule   Oral   Take 1 capsule by mouth 3 (three) times daily.         . traMADol (ULTRAM) 50 MG tablet   Oral   Take 1 tablet (50 mg total) by mouth every 6 (six) hours as needed.   20 tablet   0     Allergies Bactrim; Butorphanol tartrate; Food; Ibuprofen; Ketorolac; Lorazepam; Naproxen; Nsaids; and Prochlorperazine  No family history on file.  Social History Social History  Substance Use Topics  . Smoking status: Current Every Day Smoker -- 0.50 packs/day    Types: Cigarettes  . Smokeless tobacco: None  . Alcohol Use: No    Review of Systems  Constitutional: No fever/chills Eyes: No visual changes. No discharge ENT: No sore  throat. Cardiovascular: Denies chest pain. Respiratory: Denies shortness of breath. Gastrointestinal: No abdominal pain.  No nausea, no vomiting.   Genitourinary: As above Musculoskeletal: Negative for back pain. Skin: Negative for rash. Neurological: Negative for headaches   10-point ROS otherwise negative.  ____________________________________________   PHYSICAL EXAM:  VITAL SIGNS: ED Triage Vitals  Enc Vitals Group     BP 03/04/16 1338 96/56 mmHg     Pulse Rate 03/04/16 1338 120     Resp 03/04/16 1338 18     Temp 03/04/16 1338 98 F (36.7 C)     Temp Source 03/04/16 1338 Oral     SpO2 03/04/16 1338 98 %     Weight 03/04/16 1338 145 lb (65.772 kg)     Height 03/04/16 1338 5\' 5"  (1.651 m)     Head Cir --      Peak Flow --      Pain Score 03/04/16 1338 9     Pain Loc --      Pain Edu? --      Excl. in GC? --    Constitutional: Alert and oriented. No acute distress Eyes: Conjunctivae are normal.  Head: Atraumatic.Normocephalic Nose: No congestion/rhinnorhea. Mouth/Throat: Mucous membranes are moist.    Cardiovascular: Normal rate, regular rhythm.  Respiratory: Normal respiratory effort.  No retractions. Gastrointestinal:  Soft and nontender. No distention.  No CVA tenderness. Genitourinary: Thick white discharge, no CMT, no foreign body Musculoskeletal: No lower extremity tenderness nor edema.  Warm and well perfused Neurologic:  Normal speech and language. No gross focal neurologic deficits are appreciated.  Skin:  Skin is warm, dry and intact. No rash noted. Psychiatric: Mood and affect are normal. Speech normal  ____________________________________________   LABS (all labs ordered are listed, but only abnormal results are displayed)  Labs Reviewed  URINALYSIS COMPLETEWITH MICROSCOPIC ([facility] ONLY) - Abnormal; Notable for the following:    Color, Urine AMBER (*)    APPearance HAZY (*)    Bilirubin Urine 1+ (*)    Protein, ur 30 (*)    Bacteria, UA RARE (*)     Squamous Epithelial / LPF 6-30 (*)    All other components within normal limits  HCG, QUANTITATIVE, PREGNANCY  POC URINE PREG, ED  POCT PREGNANCY, URINE  ABO/RH   ____________________________________________  EKG  None ____________________________________________  RADIOLOGY  None ____________________________________________   PROCEDURES  Procedure(s) performed: No    Critical Care performed:No ____________________________________________   INITIAL IMPRESSION / ASSESSMENT AND PLAN / ED COURSE  Pertinent labs & imaging results that were available during my care of the patient were reviewed by me and considered in my medical decision making (see chart for details).  Patient presents with possible foreign body in the vagina. On exam no foreign body found. We did send GC and Chlamydia/wet prep and informed the patient those tests were pending. The patient abruptly told the nurse that she needed to smoke and left even though she was informed that she would not be able to come back in ____________________________________________   FINAL CLINICAL IMPRESSION(S) / ED DIAGNOSES  Final diagnoses:  Vaginal discharge      NEW MEDICATIONS STARTED DURING THIS VISIT:  New Prescriptions   No medications on file     Note:  This document was prepared using Dragon voice recognition software and may include unintentional dictation errors.    Jene Everyobert Ruth Kovich, MD 03/04/16 1539

## 2016-03-04 NOTE — ED Notes (Addendum)
After pelvic exam, pt stands up and puts on her clothes. States that she is going outside to smoke. Informed patient that she cannot leave the treatment room until disposition is made if she wants to receive treatment at this time. Pt states, "yes I can,  I'll be back to get my medicines". Pt informed by this RN again that she cannot leave treatment room to go outside and smoke and then come back to her original room to continue treatment.  MD informed. Pt eloped.

## 2016-03-04 NOTE — ED Notes (Addendum)
Patient to ER for c/o "cervix pain" x few days. States last period was 1.5 months ago. Patient tachycardic in triage, just finished smoking prior to coming in. Patient believes she is pregnant.

## 2016-03-11 ENCOUNTER — Emergency Department
Admission: EM | Admit: 2016-03-11 | Discharge: 2016-03-11 | Disposition: A | Discharge status: Other Acute Inpatient Hospital | Payer: MEDICAID | Attending: Emergency Medicine | Admitting: Emergency Medicine

## 2016-03-11 ENCOUNTER — Encounter: Payer: Self-pay | Admitting: Medical Oncology

## 2016-03-11 ENCOUNTER — Inpatient Hospital Stay
Admit: 2016-03-11 | Discharge: 2016-03-29 | DRG: 885 | Disposition: A | Discharge status: Home / Self Care | Payer: MEDICAID | Source: Intra-hospital | Attending: Psychiatry | Admitting: Psychiatry

## 2016-03-11 DIAGNOSIS — Z888 Allergy status to other drugs, medicaments and biological substances status: Secondary | ICD-10-CM | POA: Diagnosis not present

## 2016-03-11 DIAGNOSIS — G4700 Insomnia, unspecified: Secondary | ICD-10-CM | POA: Diagnosis present

## 2016-03-11 DIAGNOSIS — Z046 Encounter for general psychiatric examination, requested by authority: Secondary | ICD-10-CM

## 2016-03-11 DIAGNOSIS — F17210 Nicotine dependence, cigarettes, uncomplicated: Secondary | ICD-10-CM | POA: Insufficient documentation

## 2016-03-11 DIAGNOSIS — R569 Unspecified convulsions: Secondary | ICD-10-CM | POA: Diagnosis present

## 2016-03-11 DIAGNOSIS — F17200 Nicotine dependence, unspecified, uncomplicated: Secondary | ICD-10-CM | POA: Diagnosis present

## 2016-03-11 DIAGNOSIS — Z79899 Other long term (current) drug therapy: Secondary | ICD-10-CM | POA: Diagnosis not present

## 2016-03-11 DIAGNOSIS — F312 Bipolar disorder, current episode manic severe with psychotic features: Secondary | ICD-10-CM | POA: Diagnosis present

## 2016-03-11 DIAGNOSIS — A599 Trichomoniasis, unspecified: Secondary | ICD-10-CM | POA: Diagnosis present

## 2016-03-11 DIAGNOSIS — E119 Type 2 diabetes mellitus without complications: Secondary | ICD-10-CM | POA: Insufficient documentation

## 2016-03-11 DIAGNOSIS — F29 Unspecified psychosis not due to a substance or known physiological condition: Secondary | ICD-10-CM

## 2016-03-11 DIAGNOSIS — Z8669 Personal history of other diseases of the nervous system and sense organs: Secondary | ICD-10-CM | POA: Insufficient documentation

## 2016-03-11 DIAGNOSIS — F1220 Cannabis dependence, uncomplicated: Secondary | ICD-10-CM | POA: Diagnosis present

## 2016-03-11 LAB — COMPREHENSIVE METABOLIC PANEL
ALT: 22 U/L (ref 14–54)
AST: 27 U/L (ref 15–41)
Albumin: 4.7 g/dL (ref 3.5–5.0)
Alkaline Phosphatase: 76 U/L (ref 38–126)
Anion gap: 11 (ref 5–15)
BILIRUBIN TOTAL: 0.9 mg/dL (ref 0.3–1.2)
BUN: 30 mg/dL — AB (ref 6–20)
CALCIUM: 9.8 mg/dL (ref 8.9–10.3)
CO2: 27 mmol/L (ref 22–32)
CREATININE: 1 mg/dL (ref 0.44–1.00)
Chloride: 102 mmol/L (ref 101–111)
Glucose, Bld: 98 mg/dL (ref 65–99)
Potassium: 3.1 mmol/L — ABNORMAL LOW (ref 3.5–5.1)
Sodium: 140 mmol/L (ref 135–145)
TOTAL PROTEIN: 8.3 g/dL — AB (ref 6.5–8.1)

## 2016-03-11 LAB — URINE DRUG SCREEN, QUALITATIVE (ARMC ONLY)
Amphetamines, Ur Screen: NOT DETECTED
BARBITURATES, UR SCREEN: NOT DETECTED
BENZODIAZEPINE, UR SCRN: NOT DETECTED
CANNABINOID 50 NG, UR ~~LOC~~: POSITIVE — AB
Cocaine Metabolite,Ur ~~LOC~~: NOT DETECTED
MDMA (ECSTASY) UR SCREEN: NOT DETECTED
Methadone Scn, Ur: NOT DETECTED
Opiate, Ur Screen: NOT DETECTED
PHENCYCLIDINE (PCP) UR S: NOT DETECTED
Tricyclic, Ur Screen: POSITIVE — AB

## 2016-03-11 LAB — CBC
HCT: 40.3 % (ref 35.0–47.0)
Hemoglobin: 13.5 g/dL (ref 12.0–16.0)
MCH: 29.6 pg (ref 26.0–34.0)
MCHC: 33.5 g/dL (ref 32.0–36.0)
MCV: 88.4 fL (ref 80.0–100.0)
PLATELETS: 258 10*3/uL (ref 150–440)
RBC: 4.56 MIL/uL (ref 3.80–5.20)
RDW: 13.6 % (ref 11.5–14.5)
WBC: 6.3 10*3/uL (ref 3.6–11.0)

## 2016-03-11 LAB — PREGNANCY, URINE: PREG TEST UR: NEGATIVE

## 2016-03-11 LAB — ACETAMINOPHEN LEVEL: Acetaminophen (Tylenol), Serum: <10 ug/mL — ABNORMAL LOW (ref 10–30)

## 2016-03-11 LAB — SALICYLATE LEVEL: Salicylate Lvl: <4 mg/dL (ref 2.8–30.0)

## 2016-03-11 LAB — ETHANOL

## 2016-03-11 MED ORDER — LORAZEPAM 2 MG PO TABS
2.0000 mg | ORAL_TABLET | ORAL | Status: AC
  Administered 2016-03-11: 2 mg via ORAL

## 2016-03-11 MED ORDER — RISPERIDONE 1 MG PO TABS
ORAL_TABLET | ORAL | Status: AC
  Administered 2016-03-11: 1 mg via ORAL
  Filled 2016-03-11: qty 1

## 2016-03-11 MED ORDER — LORAZEPAM 2 MG PO TABS
ORAL_TABLET | ORAL | Status: AC
  Administered 2016-03-11: 2 mg via ORAL
  Filled 2016-03-11: qty 1

## 2016-03-11 MED ORDER — METRONIDAZOLE 500 MG PO TABS
2000.0000 mg | ORAL_TABLET | Freq: Once | ORAL | Status: AC
Start: 2016-03-11 — End: 2016-03-11
  Administered 2016-03-11: 2000 mg via ORAL
  Filled 2016-03-11: qty 4

## 2016-03-11 MED ORDER — QUETIAPINE FUMARATE 200 MG PO TABS
200.0000 mg | ORAL_TABLET | Freq: Every day | ORAL | Status: DC
  Administered 2016-03-11: 200 mg via ORAL
  Filled 2016-03-11: qty 1

## 2016-03-11 MED ORDER — LORAZEPAM 2 MG PO TABS
2.0000 mg | ORAL_TABLET | ORAL | Status: DC | PRN
  Administered 2016-03-11: 2 mg via ORAL
  Filled 2016-03-11: qty 1

## 2016-03-11 MED ORDER — PHENYTOIN SODIUM EXTENDED 100 MG PO CAPS
300.0000 mg | ORAL_CAPSULE | Freq: Once | ORAL | Status: DC
  Filled 2016-03-11: qty 3

## 2016-03-11 MED ORDER — RISPERIDONE 1 MG PO TABS
1.0000 mg | ORAL_TABLET | ORAL | Status: AC
  Administered 2016-03-11: 1 mg via ORAL

## 2016-03-11 NOTE — ED Notes (Signed)

## 2016-03-11 NOTE — ED Notes (Signed)
Per Triage RN pt was IVC'd by boyfriend bc pt has been out of her psych meds and threw the microwave from the second story appt stating there were demons in it. Per boyfriend pts mental health has been declining over past week since she has ran out of meds.

## 2016-03-11 NOTE — Consult Note (Signed)
[facility] Face-to-Face Psychiatry Consult   Reason for Consult:  Consult for this 45 year old woman brought in by EMS and law enforcement because of bizarre behavior. Referring Physician:  Joni Anderson Patient Identification: Karina Anderson MRN:  [id number] Principal Diagnosis: Bipolar disorder, current episode manic severe with psychotic features [facility]) Diagnosis:   Patient Active Problem List   Diagnosis Date Noted  . Seizures ([place]) [R56.9] 03/11/2016  . Bipolar disorder, current episode manic severe with psychotic features ([place]) [F31.2] 03/11/2016  . Involuntary commitment [Z04.6] 03/11/2016    Total Time spent with patient: 1 hour  Subjective:   Karina Anderson is a 45 y.o. female patient admitted with "I've been real stressed out".  HPI:  This is a 45 year old woman who was brought to the hospital under involuntary commitment. Apparently law enforcement picked her up because she was behaving bizarrely and dangerously in public. Most dramatically throwing a microwave oven out of a window into the Karina Anderson. She was taken to [facility] and evaluated and found to be psychotic. On interview today the patient says that she's been stressed out for many days. She says she hasn't been eating for several days because she is fasting. Patient is somewhat disorganized about her symptoms but does admit that she's been hearing voices talk to her. Mood is described as happy but her affect is labile and agitated. She denies that she's been taking any medicine recently particularly denying that she's been taking her Dilantin. Patient says a lot of her stress comes from her relationship with her boyfriend but she is not very specific about it. She denies that she's been drinking but says that she does use marijuana regularly although she claims she hasn't had any in over 2 weeks. Patient shaved hair off her head in a very disorganized way. She tells me she did this because her boyfriend wanted her to be submissive, but the way she  describes it sounds like it might of been a hallucination. She denies that he actually said anything to her about it. Patient admits that she had suicidal thoughts as recently as about 3 or 4 weeks ago and that she tried to take an overdose of Tylenol at that time.  Social history: Hard to pin her down. She tells me she has her own house but that is a house she's only lived in for a few days and that it has no furniture and it. Not clear if she might be squatting somewhere. She says her daughters are living somewhere else. She really can't give me a clear history about it. Evidently she is on long-term disability.  Medical history: Patient reportedly has a history of seizures. She is not able to really give me much detail or described them. She is supposed to be taking Dilantin 300 mg a day. Diabetes is listed as one of her problems as well although her blood sugars currently are normal and she didn't say anything about it. Patient has had several visits to our emergency room over the past few months always with some kind of complaint about a bad feeling in her abdomen. Most recently she was here actually explicitly complaining that she had a stone inside her vagina. His examined and did not actually have any foreign body.  Substance abuse history: Reports that she does not drink. Says that she smokes pot regularly but hasn't had any in a couple weeks.  Past Psychiatric History: She admits that she had psychiatric treatment earlier in her life. She said about age 45 she saw  a psychiatrist and was on lithium for a while. She can't remember any details about it. She had 1 previous psychiatric hospitalization at age 45 but claims that she was not having any symptoms at that time.  Risk to Self: Is patient at risk for suicide?: No Risk to Others:   Prior Inpatient Therapy:   Prior Outpatient Therapy:    Past Medical History:  Past Medical History  Diagnosis Date  . Seizures ([place])   . Diabetes mellitus  without complication ([place])    History reviewed. No pertinent past surgical history. Family History: No family history on file. Family Psychiatric  History: Patient says her mother has a history of nervous breakdowns Social History:  History  Alcohol Use No     History  Drug Use No    Social History   Social History  . Marital Status: Single    Spouse Name: N/A  . Number of Children: N/A  . Years of Education: N/A   Social History Main Topics  . Smoking status: Current Every Day Smoker -- 0.50 packs/day    Types: Cigarettes  . Smokeless tobacco: None  . Alcohol Use: No  . Drug Use: No  . Sexual Activity: Not Asked   Other Topics Concern  . None   Social History Narrative   Additional Social History:    Allergies:   Allergies  Allergen Reactions  . Bactrim [Sulfamethoxazole-Trimethoprim] Hives  . Butorphanol Tartrate   . Food Other (See Comments)    Allergic to Citrus Fruits.  . Ibuprofen   . Ketorolac   . Lorazepam   . Naproxen   . Nsaids   . Prochlorperazine     Labs:  Results for orders placed or performed during the hospital encounter of 03/11/16 (from the past 48 hour(s))  Comprehensive metabolic panel     Status: Abnormal   Collection Time: 03/11/16  2:25 PM  Result Value Ref Range   Sodium 140 135 - 145 mmol/L   Potassium 3.1 (L) 3.5 - 5.1 mmol/L   Chloride 102 101 - 111 mmol/L   CO2 27 22 - 32 mmol/L   Glucose, Bld 98 65 - 99 mg/dL   BUN 30 (H) 6 - 20 mg/dL   Creatinine, Ser 1.00 0.44 - 1.00 mg/dL   Calcium 9.8 8.9 - 10.3 mg/dL   Total Protein 8.3 (H) 6.5 - 8.1 g/dL   Albumin 4.7 3.5 - 5.0 g/dL   AST 27 15 - 41 U/L   ALT 22 14 - 54 U/L   Alkaline Phosphatase 76 38 - 126 U/L   Total Bilirubin 0.9 0.3 - 1.2 mg/dL   GFR calc non Af Amer >60 >60 mL/min   GFR calc Af Amer >60 >60 mL/min    Comment: (NOTE) The eGFR has been calculated using the CKD EPI equation. This calculation has not been validated in all clinical situations. eGFR's  persistently <60 mL/min signify possible Chronic Kidney Disease.    Anion gap 11 5 - 15  Ethanol     Status: None   Collection Time: 03/11/16  2:25 PM  Result Value Ref Range   Alcohol, Ethyl (B) <5 <5 mg/dL    Comment:        LOWEST DETECTABLE LIMIT FOR SERUM ALCOHOL IS 5 mg/dL FOR MEDICAL PURPOSES ONLY   Salicylate level     Status: None   Collection Time: 03/11/16  2:25 PM  Result Value Ref Range   Salicylate Lvl <1.0 2.8 - 30.0 mg/dL  Acetaminophen  level     Status: Abnormal   Collection Time: 03/11/16  2:25 PM  Result Value Ref Range   Acetaminophen (Tylenol), Serum <10 (L) 10 - 30 ug/mL    Comment:        THERAPEUTIC CONCENTRATIONS VARY SIGNIFICANTLY. A RANGE OF 10-30 ug/mL MAY BE AN EFFECTIVE CONCENTRATION FOR MANY PATIENTS. HOWEVER, SOME ARE BEST TREATED AT CONCENTRATIONS OUTSIDE THIS RANGE. ACETAMINOPHEN CONCENTRATIONS >150 ug/mL AT 4 HOURS AFTER INGESTION AND >50 ug/mL AT 12 HOURS AFTER INGESTION ARE OFTEN ASSOCIATED WITH TOXIC REACTIONS.   cbc     Status: None   Collection Time: 03/11/16  2:25 PM  Result Value Ref Range   WBC 6.3 3.6 - 11.0 K/uL   RBC 4.56 3.80 - 5.20 MIL/uL   Hemoglobin 13.5 12.0 - 16.0 g/dL   HCT 40.3 35.0 - 47.0 %   MCV 88.4 80.0 - 100.0 fL   MCH 29.6 26.0 - 34.0 pg   MCHC 33.5 32.0 - 36.0 g/dL   RDW 13.6 11.5 - 14.5 %   Platelets 258 150 - 440 K/uL  Urine Drug Screen, Qualitative     Status: Abnormal   Collection Time: 03/11/16  2:41 PM  Result Value Ref Range   Tricyclic, Ur Screen POSITIVE (A) NONE DETECTED   Amphetamines, Ur Screen NONE DETECTED NONE DETECTED   MDMA (Ecstasy)Ur Screen NONE DETECTED NONE DETECTED   Cocaine Metabolite,Ur [place] NONE DETECTED NONE DETECTED   Opiate, Ur Screen NONE DETECTED NONE DETECTED   Phencyclidine (PCP) Ur S NONE DETECTED NONE DETECTED   Cannabinoid 50 Ng, Ur [place] POSITIVE (A) NONE DETECTED   Barbiturates, Ur Screen NONE DETECTED NONE DETECTED   Benzodiazepine, Ur Scrn NONE DETECTED NONE  DETECTED   Methadone Scn, Ur NONE DETECTED NONE DETECTED    Comment: (NOTE) 308  Tricyclics, urine               Cutoff 1000 ng/mL 200  Amphetamines, urine             Cutoff 1000 ng/mL 300  MDMA (Ecstasy), urine           Cutoff 500 ng/mL 400  Cocaine Metabolite, urine       Cutoff 300 ng/mL 500  Opiate, urine                   Cutoff 300 ng/mL 600  Phencyclidine (PCP), urine      Cutoff 25 ng/mL 700  Cannabinoid, urine              Cutoff 50 ng/mL 800  Barbiturates, urine             Cutoff 200 ng/mL 900  Benzodiazepine, urine           Cutoff 200 ng/mL 1000 Methadone, urine                Cutoff 300 ng/mL 1100 1200 The urine drug screen provides only a preliminary, unconfirmed 1300 analytical test result and should not be used for non-medical 1400 purposes. Clinical consideration and professional judgment should 1500 be applied to any positive drug screen result due to possible 1600 interfering substances. A more specific alternate chemical method 1700 must be used in order to obtain a confirmed analytical result.  1800 Gas chromato graphy / mass spectrometry (GC/MS) is the preferred 1900 confirmatory method.     Current Facility-Administered Medications  Medication Dose Route Frequency Provider Last Rate Last Dose  . LORazepam (ATIVAN) tablet 2 mg  2  mg Oral Q4H PRN Gonzella Lex, MD      . phenytoin (DILANTIN) ER capsule 300 mg  300 mg Oral Once Gonzella Lex, MD      . QUEtiapine (SEROQUEL) tablet 200 mg  200 mg Oral QHS Gonzella Lex, MD       Current Outpatient Prescriptions  Medication Sig Dispense Refill  . phenytoin (DILANTIN) 100 MG ER capsule Take 1 capsule by mouth 3 (three) times daily.      Musculoskeletal: Strength & Muscle Tone: within normal limits Gait & Station: normal Patient leans: N/A  Psychiatric Specialty Exam: Physical Exam  Nursing note and vitals reviewed. Constitutional: She appears well-developed and well-nourished.  HENT:  Head:  Normocephalic and atraumatic.  Eyes: Conjunctivae are normal. Pupils are equal, round, and reactive to light.  Neck: Normal range of motion.  Cardiovascular: Regular rhythm and normal heart sounds.   Respiratory: Effort normal. No respiratory distress.  GI: Soft.  Musculoskeletal: Normal range of motion.  Neurological: She is alert.  Skin: Skin is warm and dry.  Psychiatric: Her affect is angry, labile and inappropriate. Her speech is rapid and/or pressured. She is agitated and aggressive. Thought content is paranoid and delusional. Cognition and memory are impaired. She expresses impulsivity.    Review of Systems  Constitutional: Negative.   HENT: Negative.   Eyes: Negative.   Respiratory: Negative.   Cardiovascular: Negative.   Gastrointestinal: Negative.   Musculoskeletal: Negative.   Skin: Negative.   Neurological: Negative.   Psychiatric/Behavioral: Positive for hallucinations and substance abuse. Negative for depression, suicidal ideas and memory loss. The patient is nervous/anxious and has insomnia.     Blood pressure 97/76, pulse 111, temperature 98 F (36.7 C), temperature source Oral, resp. rate 18, height 5' 3"  (1.6 m), weight 54.432 kg (120 lb), last menstrual period 01/20/2016, SpO2 99 %.Body mass index is 21.26 kg/(m^2).  General Appearance: Disheveled  Eye Contact:  Minimal  Speech:  Garbled and Pressured  Volume:  Increased  Mood:  Irritable  Affect:  Inappropriate and Labile  Thought Process:  Disorganized  Orientation:  Negative  Thought Content:  Illogical, Hallucinations: Auditory, Paranoid Ideation and Tangential  Suicidal Thoughts:  Yes.  without intent/plan  Homicidal Thoughts:  No  Memory:  Immediate;   Good Recent;   Poor Remote;   Fair  Judgement:  Impaired  Insight:  Shallow  Psychomotor Activity:  Increased and Restlessness  Concentration:  Concentration: Poor  Recall:  Poor  Fund of Knowledge:  Fair  Language:  Fair  Akathisia:  No  Handed:   Right  AIMS (if indicated):     Assets:  Physical Health Resilience  ADL's:  Intact  Cognition:  Impaired,  Mild  Sleep:        Treatment Plan Summary: Daily contact with patient to assess and evaluate symptoms and progress in treatment, Medication management and Plan 45 year old woman who presents very psychotic. Confused and agitated. Rapid speech. Euphoric angry labile affect and mood. Seems to be paranoid and admits to some hallucinations. Also admits that she had suicidal ideation as recently as a few weeks ago. Seems to be off of her baseline. Hard to know exactly what to make of this without more detail but it seems to me most consistent with a bipolar manic episode. Patient will be admitted to the psychiatric ward. When necessary Ativan. Start Seroquel for bipolar disorder. 15 minute checks.  Disposition: Recommend psychiatric Inpatient admission when medically cleared. Supportive therapy provided about ongoing stressors.  Alethia Berthold, MD 03/11/2016 4:42 PM

## 2016-03-11 NOTE — ED Notes (Signed)
Patient asleep in room.  Meal tray left at bedside.

## 2016-03-11 NOTE — ED Provider Notes (Signed)
Discussed with Dr. Toni Amendlapacs after his evaluation. Tentative plan to hospitalize for inpatient behavioral medicine treatment.  Sharman CheekPhillip Campbell Kray, MD 03/11/16 [phone]

## 2016-03-11 NOTE — ED Notes (Signed)
Patient asleep in room. No noted distress or abnormal behavior. Will continue 15 minute checks and observation by security cameras for safety. 

## 2016-03-11 NOTE — ED Provider Notes (Signed)
[facility] Emergency Department Provider Note  Time seen: 2:19 PM  I have reviewed the triage vital signs and the nursing notes.   HISTORY  Chief Complaint Psychiatric Evaluation    HPI Karina Anderson is a 45 y.o. female with a past medical history of seizures, diabetes who presents to the emergency department under involuntary commitment from her psychiatrist. According to the report the patient has been acting psychotic, through the microwave out of a second-story window because there is demons within it.Patient was seen by [facility] today, she does admit to attempted suicide with Tylenol overdose 6 weeks ago but did not get evaluated at that time. Patient's only medical concern is vaginal discharge. She was seen in the emergency department 1 week ago diagnosed with trichomoniasis but [facility]. Patient denies any other medical complaints at this time. She states she has had thoughts of hurting herself but nothing recent.     Past Medical History  Diagnosis Date  . Seizures ([facility])   . Diabetes mellitus without complication ([facility])     There are no active problems to display for this patient.   History reviewed. No pertinent past surgical history.  Current Outpatient Rx  Name  Route  Sig  Dispense  Refill  . HYDROcodone-acetaminophen (NORCO) 5-325 MG tablet   Oral   Take 1 tablet by mouth every 6 (six) hours as needed for moderate pain.   20 tablet   0   . ondansetron (ZOFRAN) 4 MG tablet   Oral   Take 1 tablet (4 mg total) by mouth every 8 (eight) hours as needed for nausea or vomiting.   21 tablet   0   . phenytoin (DILANTIN) 100 MG ER capsule   Oral   Take 1 capsule by mouth 3 (three) times daily.         . traMADol (ULTRAM) 50 MG tablet   Oral   Take 1 tablet (50 mg total) by mouth every 6 (six) hours as needed.   20 tablet   0     Allergies Bactrim; Butorphanol  tartrate; Food; Ibuprofen; Ketorolac; Lorazepam; Naproxen; Nsaids; and Prochlorperazine  No family history on file.  Social History Social History  Substance Use Topics  . Smoking status: Current Every Day Smoker -- 0.50 packs/day    Types: Cigarettes  . Smokeless tobacco: None  . Alcohol Use: No    Review of Systems Constitutional: Negative for fever. Cardiovascular: Negative for chest pain. Respiratory: Negative for shortness of breath. Gastrointestinal: Negative for abdominal pain Musculoskeletal: Negative for back pain. Neurological: Negative for headache Psychiatric:Attempted suicide 6 weeks ago by Tylenol overdose, did not seek medical help. They SI. Denies any active plan. 10-point ROS otherwise negative.  ____________________________________________   PHYSICAL EXAM:  VITAL SIGNS: ED Triage Vitals  Enc Vitals Group     BP 03/11/16 1302 97/76 mmHg     Pulse Rate 03/11/16 1302 111     Resp 03/11/16 1302 18     Temp 03/11/16 1301 98 F (36.7 C)     Temp Source 03/11/16 1301 Oral     SpO2 03/11/16 1302 99 %     Weight 03/11/16 1302 120 lb (54.432 kg)     Height 03/11/16 1302 5\' 3"  (1.6 m)     Head Cir --      Peak Flow --      Pain Score 03/11/16 1302 9     Pain  Loc --      Pain Edu? --      Excl. in GC? --     Constitutional: Alert and oriented. Well appearing and in no distress. Eyes: Normal exam ENT   Head: Normocephalic and atraumatic   Mouth/Throat: Mucous membranes are moist. Cardiovascular: Normal rate, regular rhythm. No murmur Respiratory: Normal respiratory effort without tachypnea nor retractions. Breath sounds are clear  Gastrointestinal: Soft and nontender. No distention.   Musculoskeletal: Nontender with normal range of motion in all extremities. Neurologic:  Normal speech and language. No gross focal neurologic deficits Skin:  Skin is warm, dry and intact.  Psychiatric: Denies any active SI or  HI. ____________________________________________     INITIAL IMPRESSION / ASSESSMENT AND PLAN / ED COURSE  Pertinent labs & imaging results that were available during my care of the patient were reviewed by me and considered in my medical decision making (see chart for details).  Patient presents under involuntary commitment from [facility]. We will check labs, have psychiatry see and evaluate the patient in the emergency department. Patient does state vaginal discharge, she had a wet prep performed 7 days ago positive for trichomoniasis for which she was never treated as she eloped from the emergency department. We'll start the patient on Flagyl.  ____________________________________________   FINAL CLINICAL IMPRESSION(S) / ED DIAGNOSES  Psychosis SI   Minna Antis, MD 03/11/16 1427

## 2016-03-11 NOTE — ED Notes (Signed)

## 2016-03-11 NOTE — ED Notes (Signed)
[facility]. Verbally threatening staff. Incoherent at times. Patient very psychotic, unable to respond to any reality orientation. Patient did take some of STAT meds orally but currently will not take ordered Dilantin. Will reattempt when patient calmer. Patient refused to be escorted to a room. She is in the dayroom at present.  Maintained on 15 minute checks and observation by security camera for safety.

## 2016-03-11 NOTE — [facility] Counselor (Signed)
Sp[facility] with Amy, RN at [facility].  Per nurse's report, Pt unable to be assessed at this time due to acute psychosis.  Also reported that physician is admitting Pt.  Asked [facility] to pull consult.  EN [id number] 03/11/16

## 2016-03-11 NOTE — ED Notes (Signed)
Report was received from Amy H., RN; Pt. Verbalizes no complaints or distress; denies S.I./Hi. Presents with psychosis; refusing medications; Continue to monitor with 15 min. Monitoring.

## 2016-03-11 NOTE — ED Notes (Signed)
Pt reports that she was sent over from [facility]. Pt keeps reporting to RN that she has a "stone in her stomach". Pt denies SI at this time. Reports that she has had depression in the past but does not feel depressed at this time.

## 2016-03-12 DIAGNOSIS — F312 Bipolar disorder, current episode manic severe with psychotic features: Principal | ICD-10-CM

## 2016-03-12 LAB — LIPID PANEL
Cholesterol: 174 mg/dL (ref 0–200)
HDL: 37 mg/dL — ABNORMAL LOW (ref >40)
LDL CALC: 122 mg/dL — AB (ref 0–99)
Total CHOL/HDL Ratio: 4.7 RATIO
Triglycerides: 75 mg/dL (ref <150)
VLDL: 15 mg/dL (ref 0–40)

## 2016-03-12 LAB — HEMOGLOBIN A1C: Hgb A1c MFr Bld: 4.9 % (ref 4.0–6.0)

## 2016-03-12 LAB — TSH: TSH: 0.896 u[IU]/mL (ref 0.350–4.500)

## 2016-03-12 MED ORDER — ALUM & MAG HYDROXIDE-SIMETH 200-200-20 MG/5ML PO SUSP: 30.0000 mL | ORAL | Status: DC | PRN

## 2016-03-12 MED ORDER — PHENYTOIN SODIUM EXTENDED 100 MG PO CAPS
100.0000 mg | ORAL_CAPSULE | Freq: Three times a day (TID) | ORAL | Status: DC
  Administered 2016-03-12 – 2016-03-29 (×50): 100 mg via ORAL
  Filled 2016-03-12 (×51): qty 1

## 2016-03-12 MED ORDER — QUETIAPINE FUMARATE 200 MG PO TABS
200.0000 mg | ORAL_TABLET | Freq: Every day | ORAL | Status: DC
  Filled 2016-03-12: qty 1

## 2016-03-12 MED ORDER — LORAZEPAM 2 MG PO TABS
2.0000 mg | ORAL_TABLET | ORAL | Status: DC | PRN
Start: 2016-03-12 — End: 2016-03-14
  Filled 2016-03-12: qty 1

## 2016-03-12 MED ORDER — ENSURE ENLIVE PO LIQD
237.0000 mL | Freq: Two times a day (BID) | ORAL | Status: DC
  Administered 2016-03-12 – 2016-03-28 (×27): 237 mL via ORAL

## 2016-03-12 MED ORDER — MAGNESIUM HYDROXIDE 400 MG/5ML PO SUSP
30.0000 mL | Freq: Every day | ORAL | Status: DC | PRN
  Administered 2016-03-23: 30 mL via ORAL
  Filled 2016-03-12: qty 30

## 2016-03-12 MED ORDER — ACETAMINOPHEN 325 MG PO TABS
650.0000 mg | ORAL_TABLET | Freq: Four times a day (QID) | ORAL | Status: DC | PRN
  Administered 2016-03-13 – 2016-03-16 (×5): 650 mg via ORAL
  Filled 2016-03-12 (×6): qty 2

## 2016-03-12 MED ORDER — NICOTINE 21 MG/24HR TD PT24
21.0000 mg | MEDICATED_PATCH | Freq: Every day | TRANSDERMAL | Status: DC
  Filled 2016-03-12 (×4): qty 1

## 2016-03-12 NOTE — Progress Notes (Signed)
Patient  Is labile.  Became upset with Child psychotherapistsocial worker, demanding a copy of her IVC papers.  Copy given.  Patient then  Sat in main hallway and cried.   When approached patient, patient states "I am going to sue all of yall because you know this is wrong.  Refused to eat lunch.  Isolates to self to room.  When ventures outs asks "can I leave now" When told that she will not be able to leave begins to cry. Refused all medications except for dilantin.

## 2016-03-12 NOTE — Plan of Care (Signed)
Problem: Coping: Goal: Ability to identify and develop effective coping behavior will improve Outcome: Not Progressing Labile and crying.  Verbalizes that she does not need to be here.  Refuses any medications to help with anxiety.

## 2016-03-12 NOTE — Progress Notes (Signed)
NUTRITION ASSESSMENT  Pt identified as at risk on the Malnutrition Screen Tool  INTERVENTION: 1. Monitor intake and cater to pt preferences within diet 2. Supplements: recommend Ensure Enlive po BID, each supplement provides 350 kcal and 20 grams of protein   NUTRITION DIAGNOSIS: Unintentional weight loss related to sub-optimal intake as evidenced by pt report.   Goal: Pt to meet >/= 90% of their estimated nutrition needs.  Monitor:  PO intake  Assessment:    45 y.o. female admitted with bipolar psychotic features  Height: Ht Readings from Last 1 Encounters:  03/11/16 5\' 4"  (1.626 m)    Weight: Wt Readings from Last 1 Encounters:  03/11/16 124 lb (56.246 kg)    Weight Hx: Wt Readings from Last 10 Encounters:  03/11/16 124 lb (56.246 kg)  03/11/16 120 lb (54.432 kg)  03/04/16 145 lb (65.772 kg)  11/24/15 155 lb (70.308 kg)  10/01/15 145 lb (65.772 kg)  09/30/15 130 lb (58.968 kg)    BMI:  Body mass index is 21.27 kg/(m^2).   Estimated Nutritional Needs: Kcal: 25-30 kcal/kg Protein: > 1 gram protein/kg Fluid: 1 ml/kcal  Diet Order: Diet regular Room service appropriate?: Yes; Fluid consistency:: Thin Pt is also offered choice of unit snacks. Pt is eating as desired.   Lab results and medications reviewed.   Demar Shad B. Freida BusmanAllen, RD, LDN [phone] (pager) Weekend/On-Call pager [phone])

## 2016-03-12 NOTE — Plan of Care (Signed)
Problem: Coping: Goal: Ability to verbalize frustrations and anger appropriately will improve Outcome: Not Progressing Patient not able to verbalizee frustrations at this time [employer] RN

## 2016-03-12 NOTE — [facility] Group Notes (Signed)
[facility] LCSW Group Therapy  03/12/2016 2:35 PM  Type of Therapy:  Group Therapy  Participation Level:  Did Not Attend but was invited to attend.   Summary of Progress/Problems:  Emotional Regulation: Patients will identify both negative and positive emotions. They will discuss emotions they have difficulty regulating and how they impact their lives. Patients will be asked to identify healthy coping skills to combat unhealthy reactions to negative emotions.   Karolina Zamor G. Garnette CzechSampson MSW, LCSWA  03/12/2016, 2:35 PM

## 2016-03-12 NOTE — Tx Team (Signed)
Initial Interdisciplinary Treatment Plan   PATIENT STRESSORS: Significant other issues Financial children  PATIENT STRENGTHS: Easily redirectable    PROBLEM LIST: Problem List/Patient Goals  Date to be addressed Date deferred Reason deferred Estimated date of resolution  anxiety 03/12/16   03/20/16  depression 03/12/16   03/20/16  "go home" 03/12/16   03/20/16  "control anxiety" 03/12/16   03/20/16                                 DISCHARGE CRITERIA:   anxiety controlled Medication management  PRELIMINARY DISCHARGE PLAN: Attend aftercare/continuing care group Return to previous living arrangement  PATIENT/FAMIILY INVOLVEMENT: This treatment plan has been presented to and reviewed with the patient, [employer] Kissinger, and/or family member,   The patient and family have been given the opportunity to ask questions and make suggestions.  Hillery JacksChristine W Kamren Heintzelman 03/12/2016, 2:18 AM

## 2016-03-12 NOTE — [facility] Suicide Risk Assessment (Signed)
[facility] Admission Suicide Risk Assessment   Nursing information obtained from:  Patient Demographic factors:  Low socioeconomic status, Unemployed Current Mental Status:  Thoughts of violence towards others Loss Factors:  Loss of significant relationship, Financial problems / change in socioeconomic status Historical Factors:  Impulsivity Risk Reduction Factors:  Positive social support, Positive therapeutic relationship, Positive coping skills or problem solving skills  Total Time spent with patient: 1 hour Principal Problem: <principal problem not specified> Diagnosis:   Patient Active Problem List   Diagnosis Date Noted  . Bipolar affective disorder, manic, severe, with psychotic behavior (HCC) [F31.2] 03/12/2016  . Seizures (HCC) [R56.9] 03/11/2016  . Bipolar disorder, current episode manic severe with psychotic features (HCC) [F31.2] 03/11/2016  . Involuntary commitment [Z04.6] 03/11/2016   Subjective Data: Patient presented to the hospital after displaying bizarre agitated destructive behavior in public. She is psychotic and having manic agitation. She is not voicing any suicidal ideation. She does not have a known past history of suicide attempts. Mood is irritable.  Continued Clinical Symptoms:  Alcohol Use Disorder Identification Test Final Score (AUDIT): 0 The "Alcohol Use Disorders Identification Test", Guidelines for Use in Primary Care, Second Edition.  World Science writer [facility]). Score between 0-7:  no or low risk or alcohol related problems. Score between 8-15:  moderate risk of alcohol related problems. Score between 16-19:  high risk of alcohol related problems. Score 20 or above:  warrants further diagnostic evaluation for alcohol dependence and treatment.   CLINICAL FACTORS:   Bipolar Disorder:   Mixed State   Musculoskeletal: Strength & Muscle Tone: within normal limits Gait & Station: normal Patient leans: N/A  Psychiatric Specialty Exam: Physical Exam   Nursing note and vitals reviewed. Constitutional: She appears well-developed and well-nourished.  HENT:  Head: Normocephalic and atraumatic.  Eyes: Conjunctivae are normal. Pupils are equal, round, and reactive to light.  Neck: Normal range of motion.  Cardiovascular: Regular rhythm and normal heart sounds.   Respiratory: Effort normal. No respiratory distress.  GI: Soft.  Musculoskeletal: Normal range of motion.  Neurological: She is alert.  Skin: Skin is warm and dry.  Psychiatric: Her affect is labile and inappropriate. Her speech is delayed. She is withdrawn. Thought content is paranoid. Cognition and memory are impaired. She expresses impulsivity.    Review of Systems  Constitutional: Negative.   HENT: Negative.   Eyes: Negative.   Respiratory: Negative.   Cardiovascular: Negative.   Gastrointestinal: Negative.   Musculoskeletal: Negative.   Skin: Negative.   Neurological: Negative.   Psychiatric/Behavioral: Positive for depression and memory loss. Negative for suicidal ideas, hallucinations and substance abuse. The patient is nervous/anxious. The patient does not have insomnia.     Blood pressure 113/66, pulse 104, temperature 97.5 F (36.4 C), temperature source Oral, resp. rate 16, height  (1.626 m), weight 56.246 kg (124 lb), last menstrual period 03/11/2016, SpO2 100 %.Body mass index is 21.27 kg/(m^2).  General Appearance: Fairly Groomed  Eye Contact:  Minimal  Speech:  Blocked  Volume:  Decreased  Mood:  Depressed and Irritable  Affect:  Constricted  Thought Process:  Disorganized  Orientation:  Full (Time, Place, and Person)  Thought Content:  Illogical and Paranoid Ideation  Suicidal Thoughts:  No  Homicidal Thoughts:  No  Memory:  Immediate;   Good Recent;   Fair Remote;   Fair  Judgement:  Impaired  Insight:  Lacking  Psychomotor Activity:  Decreased  Concentration:  Concentration: Poor  Recall:  [employer] of  Knowledge:  Fair  Language:  Fair   Akathisia:  No  Handed:  Right  AIMS (if indicated):     Assets:  Physical Health Resilience  ADL's:  Intact  Cognition:  WNL  Sleep:  Number of Hours: 8      COGNITIVE FEATURES THAT CONTRIBUTE TO RISK:  Closed-mindedness    SUICIDE RISK:   Mild:  Suicidal ideation of limited frequency, intensity, duration, and specificity.  There are no identifiable plans, no associated intent, mild dysphoria and related symptoms, good self-control (both objective and subjective assessment), few other risk factors, and identifiable protective factors, including available and accessible social support.  PLAN OF CARE: Patient with what appears to be bipolar disorder mixed episode. Denies suicidal ideation. Irritable however and somewhat disorganized in her thinking. Continue 15 minute checks. Continue medicine for bipolar disorder. Engage in groups and activities. Social work is working with the patient to establish rapport so that we can establish a good outpatient plan after discharge  I certify that inpatient services furnished can reasonably be expected to improve the patient's condition.   Mordecai RasmussenJohn Clapacs, MD 03/12/2016, 6:09 PM
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
## 2016-03-12 NOTE — [facility] Group Notes (Signed)
[facility] LCSW Group Therapy  03/12/2016 1:22 PM  Type of Therapy:  Group Therapy  Participation Level:  Did Not Attend but pt was invited to attend group.  Summary of Progress/Problems:  Emotional Regulation: Patients will identify both negative and positive emotions. They will discuss emotions they have difficulty regulating and how they impact their lives. Patients will be asked to identify healthy coping skills to combat unhealthy reactions to negative emotions.   Arelia LongestAmaris G Orvella Digiulio MSW, LCSWA 03/12/2016, 1:22 PM
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
## 2016-03-12 NOTE — Progress Notes (Signed)
Patient is alert and disorganized, and agitated, patient did come out of room for breakfast and she did fill out her self inventory sheet partially, she admits that her anxiety/ and feeling hopeless is at a 10 level. Patient also reports that she finds it difficult to concentrate. Patient denies Si/hi, Patient wants to stay in her room, would not come out to talk with social worker, patient to be monitored and encouraged to join groups and activities.

## 2016-03-12 NOTE — Progress Notes (Signed)
D: Patient is a 45 y.o.  year-old female admitted to [facility] ambulatory without difficulty. Patient is  drowsy  but oriented upon admission. A: Admission assessment completed without difficulty. Skin and contraband assessment completed with Heather  with no skin abnormalities nor contraband found. Q.15 minute safety checks were implemented at the time of admission. Patient was oriented to the unit and escorted to room   323                                                                 R: Patient was non receptive  But somewhat cooperative with admission assessment. Patient contracts for safety on the unit at this time

## 2016-03-12 NOTE — Progress Notes (Signed)
Patient ID: Karina Anderson, female   DOB: [birth year], 45 y.o.   MRN: [id number]  [facility] [place] [facility] [place] a wellness check for the pt's daughters on 03/12/16. CSW spoke with pt's friend Karina Anderson and he reports the patient lives with her daughters. He reports the pt's daughters are 3212 and 44 years old. Officer Karina Anderson informed CSW the children were not at the provided address. CSW completed CPS report on 03/12/16. Officer Karina Anderson informed CSW on 03/13/2016, the children have been found and are now with a family friend. CSW provided the name and address of the family friend to Karina Anderson, Karina Anderson.  Karina Anderson MSW, LCSWA  03/13/2016 9:45 AM

## 2016-03-13 ENCOUNTER — Encounter: Payer: Self-pay | Admitting: Psychiatry

## 2016-03-13 DIAGNOSIS — F17200 Nicotine dependence, unspecified, uncomplicated: Secondary | ICD-10-CM | POA: Diagnosis present

## 2016-03-13 DIAGNOSIS — F1220 Cannabis dependence, uncomplicated: Secondary | ICD-10-CM | POA: Diagnosis present

## 2016-03-13 NOTE — [facility] Group Notes (Signed)
[facility] LCSW Group Therapy  03/13/2016 1:37 PM  Type of Therapy: Group Therapy  Participation Level: Minimal  Participation Quality: Attentive  Affect: Appropriate  Cognitive: Alert  Insight: Limited insight   Engagement in Therapy: Limited insight   Modes of Intervention: Activity, Discussion, Education, Socialization and Support  Summary of Progress/Problems: Mindfulness: Patient discussed mindfulness and relaxing techniques and why they are beneficial. Pt discussed ways to incorporate mindfulness in their lives. Pt practiced a mindfulness techique and discussed how it made them feel. Pt attended group and stayed the entire time. She was supportive of other group members. She enjoys writing poetry.   Daisy Floroandace L Tru Leopard MSW, LCSWA  03/13/2016, 1:37 PM

## 2016-03-13 NOTE — Progress Notes (Signed)
Patient ID: Karina Anderson, female   DOB: 03/13/1971, 45 y.o.   MRN: [id number]  Pt asked to speak with CSW after group. Pt was delusional stating she had the ability to levitate and she had healing powers. Pt's thinking was disorganized and would become tearful repeatedly and then happy. Pt was focused on being discharged today and stated she did not need to be in the hospital any longer. CSW informed pt that csw's do not authorize discharge and that she would need to discuss her discharge date with assigned doctor. Pt verbalized understanding of this. Pt had no further questions for CSW at this time. Pt was later seen crying/pacing in the hallway.   Shantay Sonn G. Garnette CzechSampson MSW, LCSWA 03/13/2016, 4:33 PM

## 2016-03-13 NOTE — Progress Notes (Signed)
Patient presents to medication room.  Pleasant, calm and cooperative.  Request tylenol for rectal pain.  Sitter taken away for present.  Will continue to assess.

## 2016-03-13 NOTE — Consult Note (Signed)
[facility] Face-to-Face Psychiatry Consult   Reason for Consult:  Consult for this 45 year old woman brought in by EMS and law enforcement because of bizarre behavior. Referring Physician:  Scotty Court Patient Identification: Karina Anderson MRN:  [id number] Principal Diagnosis: Bipolar affective disorder, manic, severe, with psychotic behavior (HCC) Diagnosis:   Patient Active Problem List   Diagnosis Date Noted  . Tobacco use disorder [F17.200] 03/13/2016  . Cannabis use disorder, moderate, dependence (HCC) [F12.20] 03/13/2016  . Bipolar affective disorder, manic, severe, with psychotic behavior (HCC) [F31.2] 03/12/2016  . Seizures (HCC) [R56.9] 03/11/2016  . Involuntary commitment [Z04.6] 03/11/2016    Total Time spent with patient: 1 hour  Subjective:   Karina Anderson is a 45 y.o. female patient admitted with "I've been real stressed out".  Follow-up for this 45 year old woman with unclear past psychiatric history presenting with psychosis. Patient interviewed. Chart reviewed. Patient herself continues to be somewhat oppositional and irritable. She has been refusing antipsychotic medication. She remains disorganized with speech and statements that are very difficult to follow. She is not threatening or violent. Insight remains poor. At least she is taking her Dilantin.  HPI:  This is a 45 year old woman who was brought to the hospital under involuntary commitment. Apparently law enforcement picked her up because she was behaving bizarrely and dangerously in public. Most dramatically throwing a microwave oven out of a window into the [place]. She was taken to [facility] and evaluated and found to be psychotic. On interview today the patient says that she's been stressed out for many days. She says she hasn't been eating for several days because she is fasting. Patient is somewhat disorganized about her symptoms but does admit that she's been hearing voices talk to her. Mood is described as happy but her affect is  labile and agitated. She denies that she's been taking any medicine recently particularly denying that she's been taking her Dilantin. Patient says a lot of her stress comes from her relationship with her boyfriend but she is not very specific about it. She denies that she's been drinking but says that she does use marijuana regularly although she claims she hasn't had any in over 2 weeks. Patient shaved hair off her head in a very disorganized way. She tells me she did this because her boyfriend wanted her to be submissive, but the way she describes it sounds like it might of been a hallucination. She denies that he actually said anything to her about it. Patient admits that she had suicidal thoughts as recently as about 3 or 4 weeks ago and that she tried to take an overdose of Tylenol at that time.  Social history: Hard to pin her down. She tells me she has her own house but that is a house she's only lived in for a few days and that it has no furniture and it. Not clear if she might be squatting somewhere. She says her daughters are living somewhere else. She really can't give me a clear history about it. Evidently she is on long-term disability.  Medical history: Patient reportedly has a history of seizures. She is not able to really give me much detail or described them. She is supposed to be taking Dilantin 300 mg a day. Diabetes is listed as one of her problems as well although her blood sugars currently are normal and she didn't say anything about it. Patient has had several visits to our emergency room over the past few months always with some kind of complaint about a  bad feeling in her abdomen. Most recently she was here actually explicitly complaining that she had a stone inside her vagina. His examined and did not actually have any foreign body.  Substance abuse history: Reports that she does not drink. Says that she smokes pot regularly but hasn't had any in a couple weeks.  Past Psychiatric  History: She admits that she had psychiatric treatment earlier in her life. She said about age 46 she saw a psychiatrist and was on lithium for a while. She can't remember any details about it. She had 1 previous psychiatric hospitalization at age 42 but claims that she was not having any symptoms at that time.  Risk to Self: Is patient at risk for suicide?: No What has been your use of drugs/alcohol within the last 12 months?: Denies use.  Risk to Others:   Prior Inpatient Therapy:   Prior Outpatient Therapy:    Past Medical History:  Past Medical History  Diagnosis Date  . Seizures (HCC)   . Diabetes mellitus without complication (HCC)    History reviewed. No pertinent past surgical history. Family History: History reviewed. No pertinent family history. Family Psychiatric  History: Patient says her mother has a history of nervous breakdowns Social History:  History  Alcohol Use No     History  Drug Use No    Social History   Social History  . Marital Status: Single    Spouse Name: N/A  . Number of Children: N/A  . Years of Education: N/A   Social History Main Topics  . Smoking status: Current Every Day Smoker -- 0.50 packs/day    Types: Cigarettes  . Smokeless tobacco: None  . Alcohol Use: No  . Drug Use: No  . Sexual Activity: Not Asked   Other Topics Concern  . None   Social History Narrative   Additional Social History:    Allergies:   Allergies  Allergen Reactions  . Bactrim [Sulfamethoxazole-Trimethoprim] Hives  . Butorphanol Tartrate   . Food Other (See Comments)    Allergic to Citrus Fruits.  . Ibuprofen   . Ketorolac   . Lorazepam   . Naproxen   . Nsaids   . Prochlorperazine     Labs:  Results for orders placed or performed during the hospital encounter of 03/11/16 (from the past 48 hour(s))  Hemoglobin A1c     Status: None   Collection Time: 03/12/16  6:58 AM  Result Value Ref Range   Hgb A1c MFr Bld 4.9 4.0 - 6.0 %  Lipid panel, fasting      Status: Abnormal   Collection Time: 03/12/16  6:58 AM  Result Value Ref Range   Cholesterol 174 0 - 200 mg/dL   Triglycerides 75 <161 mg/dL   HDL 37 (L) >09 mg/dL   Total CHOL/HDL Ratio 4.7 RATIO   VLDL 15 0 - 40 mg/dL   LDL Cholesterol 604 (H) 0 - 99 mg/dL    Comment:        Total Cholesterol/HDL:CHD Risk Coronary Heart Disease Risk Table                     Men   Women  1/2 Average Risk   3.4   3.3  Average Risk       5.0   4.4  2 X Average Risk   9.6   7.1  3 X Average Risk  23.4   11.0        Use the calculated  Patient Ratio above and the CHD Risk Table to determine the patient's CHD Risk.        ATP III CLASSIFICATION (LDL):  <100     mg/dL   Optimal  161-096  mg/dL   Near or Above                    Optimal  130-159  mg/dL   Borderline  045-409  mg/dL   High  >811     mg/dL   Very High   Prolactin     Status: Abnormal   Collection Time: 03/12/16  6:58 AM  Result Value Ref Range   Prolactin 50.4 (H) 4.8 - 23.3 ng/mL    Comment: (NOTE) Performed At: [facility] [address] [place], [place] [id number] Mila Homer MD OZ:[id number]   TSH     Status: None   Collection Time: 03/12/16  6:58 AM  Result Value Ref Range   TSH 0.896 0.350 - 4.500 uIU/mL    Current Facility-Administered Medications  Medication Dose Route Frequency Provider Last Rate Last Dose  . acetaminophen (TYLENOL) tablet 650 mg  650 mg Oral Q6H PRN Audery Amel, MD   650 mg at 03/13/16 0818  . alum & mag hydroxide-simeth (MAALOX/MYLANTA) 200-200-20 MG/5ML suspension 30 mL  30 mL Oral Q4H PRN Audery Amel, MD      . feeding supplement (ENSURE ENLIVE) (ENSURE ENLIVE) liquid 237 mL  237 mL Oral BID BM Jolanta B Pucilowska, MD   237 mL at 03/13/16 1400  . LORazepam (ATIVAN) tablet 2 mg  2 mg Oral Q4H PRN Audery Amel, MD      . magnesium hydroxide (MILK OF MAGNESIA) suspension 30 mL  30 mL Oral Daily PRN Audery Amel, MD      . nicotine (NICODERM CQ - dosed in mg/24 hours)  patch 21 mg  21 mg Transdermal Daily Audery Amel, MD   21 mg at 03/12/16 1915  . phenytoin (DILANTIN) ER capsule 100 mg  100 mg Oral TID Audery Amel, MD   100 mg at 03/13/16 1615  . QUEtiapine (SEROQUEL) tablet 200 mg  200 mg Oral QHS Audery Amel, MD   200 mg at 03/12/16 2200    Musculoskeletal: Strength & Muscle Tone: within normal limits Gait & Station: normal Patient leans: N/A  Psychiatric Specialty Exam: Physical Exam  Nursing note and vitals reviewed. Constitutional: She appears well-developed and well-nourished.  HENT:  Head: Normocephalic and atraumatic.  Eyes: Conjunctivae are normal. Pupils are equal, round, and reactive to light.  Neck: Normal range of motion.  Cardiovascular: Regular rhythm and normal heart sounds.   Respiratory: Effort normal. No respiratory distress.  GI: Soft.  Musculoskeletal: Normal range of motion.  Neurological: She is alert.  Skin: Skin is warm and dry.  Psychiatric: Her affect is angry, labile and inappropriate. Her speech is rapid and/or pressured. She is agitated and aggressive. Thought content is paranoid and delusional. Cognition and memory are impaired. She expresses impulsivity.    Review of Systems  Constitutional: Negative.   HENT: Negative.   Eyes: Negative.   Respiratory: Negative.   Cardiovascular: Negative.   Gastrointestinal: Negative.   Musculoskeletal: Negative.   Skin: Negative.   Neurological: Negative.   Psychiatric/Behavioral: Positive for hallucinations and substance abuse. Negative for depression, suicidal ideas and memory loss. The patient is nervous/anxious and has insomnia.     Blood pressure 122/76, pulse 97, temperature 97.9 F (36.6 C), temperature  source Axillary, resp. rate 16, height 5\' 4"  (1.626 m), weight 56.246 kg (124 lb), last menstrual period 03/11/2016, SpO2 100 %.Body mass index is 21.27 kg/(m^2).  General Appearance: Disheveled  Eye Contact:  Minimal  Speech:  Garbled and Pressured   Volume:  Increased  Mood:  Irritable  Affect:  Inappropriate and Labile  Thought Process:  Disorganized  Orientation:  Negative  Thought Content:  Illogical, Hallucinations: Auditory, Paranoid Ideation and Tangential  Suicidal Thoughts:  Yes.  without intent/plan  Homicidal Thoughts:  No  Memory:  Immediate;   Good Recent;   Poor Remote;   Fair  Judgement:  Impaired  Insight:  Shallow  Psychomotor Activity:  Increased and Restlessness  Concentration:  Concentration: Poor  Recall:  Poor  Fund of Knowledge:  Fair  Language:  Fair  Akathisia:  No  Handed:  Right  AIMS (if indicated):     Assets:  Physical Health Resilience  ADL's:  Intact  Cognition:  Impaired,  Mild  Sleep:  Number of Hours: 7.45     Treatment Plan Summary: Daily contact with patient to assess and evaluate symptoms and progress in treatment, Medication management and Plan Minimal changes far as psychosis ultimately she is not nearly as agitated today. Has not required any interventions for sedation. The good news is that her children have been located and are safe. Talk with her about medication management strongly encouraged her to take Seroquel tonight. Continue Dilantin which apparently she is compliant with.  Disposition: Recommend psychiatric Inpatient admission when medically cleared. Supportive therapy provided about ongoing stressors.  Mordecai RasmussenJohn Lindalee Huizinga, MD 03/13/2016 6:53 PM

## 2016-03-13 NOTE — Progress Notes (Signed)
0720 Patient in bed thrashing around.   Will not respond to voice.  Just keeps kicking legs and rolling from side to side.  All side rails up and pillows placed around bed for safety.  Sitter left at bed side.   0730 Dr. Toni Amendlapacs notified.  States to place sitter at bedside for now. [id number] patient up pacing the halls.  Still refusing to respond.   [id number] Patient sitting down to eat breakfast and when this writer checks on her she looks up and gives a smirk.

## 2016-03-13 NOTE — [facility] Suicide Risk Assessment (Signed)
[facility] INPATIENT:  Family/Significant Other Suicide Prevention Education  Suicide Prevention Education:  Education Completed;Rodrigo RanBobby Williams (friend) has been identified by the patient as the family member/significant other with whom the patient will be residing, and identified as the person(s) who will aid the patient in the event of a mental health crisis (suicidal ideations/suicide attempt).  With written consent from the patient, the family member/significant other has been provided the following suicide prevention education, prior to the and/or following the discharge of the patient.  The suicide prevention education provided includes the following:  Suicide risk factors  Suicide prevention and interventions  National Suicide Hotline telephone number  [facility] assessment telephone number  [facility] Emergency Assistance 911  [facility] and/or Residential Mobile Crisis Unit telephone number  Request made of family/significant other to:  Remove weapons (e.g., guns, rifles, knives), all items previously/currently identified as safety concern.    Remove drugs/medications (over-the-counter, prescriptions, illicit drugs), all items previously/currently identified as a safety concern.  The family member/significant other verbalizes understanding of the suicide prevention education information provided.  The family member/significant other agrees to remove the items of safety concern listed above.  Sempra EnergyCandace L Josemaria Brining MSW, LCSWA  03/13/2016, 4:21 PM

## 2016-03-14 LAB — PHENYTOIN LEVEL, FREE AND TOTAL
Phenytoin, Free: NOT DETECTED ug/mL (ref 1.0–2.0)
Phenytoin, Total: 4.1 ug/mL — ABNORMAL LOW (ref 10.0–20.0)

## 2016-03-14 MED ORDER — CLONAZEPAM 1 MG PO TABS
1.0000 mg | ORAL_TABLET | Freq: Three times a day (TID) | ORAL | Status: DC | PRN
  Administered 2016-03-14 – 2016-03-15 (×2): 1 mg via ORAL
  Filled 2016-03-14 (×2): qty 1

## 2016-03-14 MED ORDER — QUETIAPINE FUMARATE 200 MG PO TABS
400.0000 mg | ORAL_TABLET | Freq: Every day | ORAL | Status: DC
  Administered 2016-03-14 – 2016-03-15 (×2): 400 mg via ORAL
  Filled 2016-03-14 (×2): qty 2

## 2016-03-14 MED ORDER — HYDROXYZINE HCL 50 MG PO TABS
50.0000 mg | ORAL_TABLET | Freq: Four times a day (QID) | ORAL | Status: DC | PRN
  Administered 2016-03-14 – 2016-03-18 (×4): 50 mg via ORAL
  Filled 2016-03-14 (×5): qty 1

## 2016-03-14 MED ORDER — METRONIDAZOLE 500 MG PO TABS
250.0000 mg | ORAL_TABLET | Freq: Four times a day (QID) | ORAL | Status: DC
  Administered 2016-03-14 – 2016-03-18 (×13): 250 mg via ORAL
  Filled 2016-03-14 (×8): qty 1
  Filled 2016-03-14: qty 0.5
  Filled 2016-03-14 (×5): qty 1
  Filled 2016-03-14: qty 0.5

## 2016-03-14 MED ORDER — HALOPERIDOL 5 MG PO TABS
5.0000 mg | ORAL_TABLET | Freq: Four times a day (QID) | ORAL | Status: DC | PRN
  Administered 2016-03-14 – 2016-03-18 (×2): 5 mg via ORAL
  Filled 2016-03-14 (×3): qty 1

## 2016-03-14 NOTE — [facility] Group Notes (Signed)
[facility] Group Notes:  (Nursing/MHT/Case Management/Adjunct)  Date:  03/14/2016  Time:  4:11 AM  Type of Therapy:  Psychoeducational Skills  Participation Level:  Active  Participation Quality:  Appropriate, Redirectable, Sharing and Supportive  Affect:  Appropriate  Cognitive:  Appropriate  Insight:  Appropriate and Good  Engagement in Group:  Engaged and Supportive  Modes of Intervention:  Discussion, Socialization and Support  Summary of Progress/Problems:  Karina MilroyLaquanda Y Josiyah Anderson 03/14/2016, 4:11 AM

## 2016-03-14 NOTE — [facility] Group Notes (Addendum)
[facility] LCSW Group Therapy   03/14/2016 9:30am Type of Therapy: Group Therapy   Participation Level: Active   Participation Quality: Attentive, Sharing and Supportive   Affect: Flat   Cognitive: Alert and Oriented   Insight: Developing/Improving and Engaged   Engagement in Therapy: Developing/Improving and Engaged   Modes of Intervention: Clarification, Confrontation, Discussion, Education, Exploration,  Limit-setting, Orientation, Problem-solving, Rapport Building, Dance movement psychotherapisteality Testing, Socialization and Support   Summary of Progress/Problems: Pt identified obstacles faced currently and processed barriers involved in overcoming these obstacles. Pt identified steps necessary for overcoming these obstacles and explored motivation (internal and external) for facing these difficulties head on. Pt further identified one area of concern in their lives and chose a goal to focus on for today.  Pt shared that the pt has chosen to seek to returning to her children as a goal and that the pt was concerned about never being able to accomplish this.  Pt further shared that one barrier to the pt's goal is [facility] that [facility] the pt "to leave".  Pt presented as labile and reported that this would allow her to get out of the house, as a result, pt displayed a tendency to dominate group sharing and presented as agitated.  Pt did not share at length as the session progressed, but did respond to prompts from the CSW with yes or no answers.

## 2016-03-14 NOTE — Progress Notes (Signed)
D: Patient has been showing some bizarre behaviors. Easily redirectable. Denies SI/HI/AVH. Avertive behaviors. States she's ready to discharge. Attended wrap up group.  A: Medication given with education. Encouragement provided.  R: Patient was compliant with medication. She has remained calm and cooperative. Safety maintained with 15 min checks.

## 2016-03-14 NOTE — Plan of Care (Signed)
Problem: Education: Goal: Mental status will improve Outcome: Not Progressing Patient irritable, flat affect, became loud and teary this afternoon. No insight.

## 2016-03-14 NOTE — [facility] Group Notes (Signed)
[facility] Group Notes:  (Nursing/MHT/Case Management/Adjunct)  Date:  03/14/2016  Time:  3:38 PM  Type of Therapy:  Psychoeducational Skills  Participation Level:  Did Not Attend    Karina Farberamela M Moses Anderson 03/14/2016, 3:38 PM

## 2016-03-14 NOTE — Plan of Care (Signed)
Problem: Safety: Goal: Periods of time without injury will increase Outcome: Progressing Patient has remained without injury during this shift.    

## 2016-03-14 NOTE — Progress Notes (Signed)
Patient noted to be irritable, angry and with a flat affect. She attended some groups. This afternoon patient demanded to be discharged. MD notified of her demands. She continued to state that she wanted to sign her "72hour"(Pt is IVC) and leave. Staff talked to patient, offered PRN and reassured. She has no insight, is disorganized in thought, suspicious and paranoid. She yelled/wailed loudly and became verbally aggressive. PRN Hydroxyzine was administered at 1542, At 1642, Pt. noted to be calmer, with no loud demands being noted. Patient denied SI, AVH and contracted for safety. Continues on Q15 min observation for safety.

## 2016-03-14 NOTE — Progress Notes (Addendum)
[facility]  03/14/2016 11:38 AM Zella BallRobin Jividen  MRN:  [id number]  Subjective: [name] remains very disorganized, unreasonable, and suspicious. She is unable to provide basic information about her situation. Child protective services were informed as the patient did not know whereabouts of her children. They seem to be accompanied by her godmother now. Apparently living condition in her house are very poor as well. The patient remains paranoid. She believes that she has a stone sitting somewhere in her body. It is not in her vagina is she was checked in the emergency room. She does not believe that she has mental illness and has no intention to follow up with a psychiatrist or taking any medications following discharge. Reportedly she was taking Seroquel prior to admission.   Principal Problem: Bipolar affective disorder, manic, severe, with psychotic behavior (HCC) Diagnosis:   Patient Active Problem List   Diagnosis Date Noted  . Tobacco use disorder [F17.200] 03/13/2016  . Cannabis use disorder, moderate, dependence (HCC) [F12.20] 03/13/2016  . Bipolar affective disorder, manic, severe, with psychotic behavior (HCC) [F31.2] 03/12/2016  . Seizures (HCC) [R56.9] 03/11/2016  . Involuntary commitment [Z04.6] 03/11/2016   Total Time spent with patient: 20 minutes  Past Psychiatric History: Bipolar disorder. Past Medical History:  Past Medical History  Diagnosis Date  . Seizures (HCC)   . Diabetes mellitus without complication (HCC)    History reviewed. No pertinent past surgical history. Family History: History reviewed. No pertinent family history. Family Psychiatric  HistorSee H&P. Social History:  History  Alcohol Use No     History  Drug Use No    Social History   Social History  . Marital Status: Single    Spouse Name: N/A  . Number of Children: N/A  . Years of Education: N/A   Social History Main Topics  . Smoking status: Current Every Day Smoker -- 0.50  packs/day    Types: Cigarettes  . Smokeless tobacco: None  . Alcohol Use: No  . Drug Use: No  . Sexual Activity: Not Asked   Other Topics Concern  . None   Social History Narrative   Additional Social History:                         Sleep: Fair  Appetite:  Fair  Current Medications: Current Facility-Administered Medications  Medication Dose Route Frequency Provider Last Rate Last Dose  . acetaminophen (TYLENOL) tablet 650 mg  650 mg Oral Q6H PRN Audery AmelJohn T Clapacs, MD   650 mg at 03/13/16 2116  . alum & mag hydroxide-simeth (MAALOX/MYLANTA) 200-200-20 MG/5ML suspension 30 mL  30 mL Oral Q4H PRN Audery AmelJohn T Clapacs, MD      . feeding supplement (ENSURE ENLIVE) (ENSURE ENLIVE) liquid 237 mL  237 mL Oral BID BM Shahmeer Bunn B Ellajane Stong, MD   237 mL at 03/14/16 1017  . magnesium hydroxide (MILK OF MAGNESIA) suspension 30 mL  30 mL Oral Daily PRN Audery AmelJohn T Clapacs, MD      . nicotine (NICODERM CQ - dosed in mg/24 hours) patch 21 mg  21 mg Transdermal Daily Audery AmelJohn T Clapacs, MD   Stopped at 03/14/16 [phone]  . phenytoin (DILANTIN) ER capsule 100 mg  100 mg Oral TID Audery AmelJohn T Clapacs, MD   100 mg at 03/14/16 0857  . QUEtiapine (SEROQUEL) tablet 200 mg  200 mg Oral QHS Audery AmelJohn T Clapacs, MD   200 mg at 03/12/16 2200    Lab Results: No results  found for this or any previous visit (from the past 48 hour(s)).  Blood Alcohol level:  Lab Results  Component Value Date   ETH <5 03/11/2016    Metabolic Disorder Labs: Lab Results  Component Value Date   HGBA1C 4.9 03/12/2016   Lab Results  Component Value Date   PROLACTIN 50.4* 03/12/2016   Lab Results  Component Value Date   CHOL 174 03/12/2016   TRIG 75 03/12/2016   HDL 37* 03/12/2016   CHOLHDL 4.7 03/12/2016   VLDL 15 03/12/2016   LDLCALC 122* 03/12/2016    Physical Findings: AIMS: Facial and Oral Movements Muscles of Facial Expression: None, normal Lips and Perioral Area: None, normal Jaw: None, normal Tongue: None, normal,Extremity  Movements Upper (arms, wrists, hands, fingers): None, normal Lower (legs, knees, ankles, toes): None, normal, Trunk Movements Neck, shoulders, hips: None, normal, Overall Severity Severity of abnormal movements (highest score from questions above): None, normal Incapacitation due to abnormal movements: None, normal Patient's awareness of abnormal movements (rate only patient's report): No Awareness, Dental Status Current problems with teeth and/or dentures?: Yes Does patient usually wear dentures?: No  CIWA:    COWS:     Musculoskeletal: Strength & Muscle Tone: within normal limits Gait & Station: normal Patient leans: N/A  Psychiatric Specialty Exam: Physical Exam  Nursing note and vitals reviewed.   Review of Systems  Neurological: Positive for seizures.  Psychiatric/Behavioral: Positive for hallucinations and substance abuse.  All other systems reviewed and are negative.   Blood pressure 113/81, pulse 107, temperature 97.9 F (36.6 C), temperature source Axillary, resp. rate 16, height 5\' 4"  (1.626 m), weight 56.246 kg (124 lb), last menstrual period 03/11/2016, SpO2 100 %.Body mass index is 21.27 kg/(m^2).  General Appearance: Fairly Groomed  Eye Contact:  Good  Speech:  Clear and Coherent  Volume:  Normal  Mood:  Anxious  Affect:  Congruent  Thought Process:  Disorganized  Orientation:  Full (Time, Place, and Person)  Thought Content:  Delusions and Paranoid Ideation  Suicidal Thoughts:  No  Homicidal Thoughts:  No  Memory:  Immediate;   Fair Recent;   Fair Remote;   Fair  Judgement:  Impaired  Insight:  Lacking  Psychomotor Activity:  Normal  Concentration:  Concentration: Poor and Attention Span: Poor  Recall:  Poor  Fund of Knowledge:  Poor  Language:  Poor  Akathisia:  No  Handed:  Right  AIMS (if indicated):     Assets:  Communication Skills Desire for Improvement Financial Resources/Insurance Resilience Social Support  ADL's:  Intact  Cognition:   WNL  Sleep:  Number of Hours: 8.25     Treatment Plan Summary: Daily contact with patient to assess and evaluate symptoms and progress in treatment and Medication management   Ms. Batt is a 45 year old female with a history of psychosis and mood instability admitted for worsening of symptoms and bizarre dangerous behavior.   1. Agitation. This has resolved.  2. Mood. The patient was restarted on Seroquel for presumed bipolar disorder. Will increase to 400 mg nightly.  3. Insomnia. She is on trazodone.  4. Seizures. The patient has been treated with Dilantin. Will check Dilantin level in the morning.   5. Smoking. She refuses contact.  6. Substance abuse. The patient minimizes her problems and declines treatment.  7. Metabolic syndrome monitoring. Lipid profile, TSH, hemoglobin A1c are normal. Prolactin 50.4.  8. Social. CPS referral was made over the weekend by the Child psychotherapist.  9. Trichomonas infection. Will  start Metronizadol.  10. Disposition. To be established. Reportedly the patient and her children are homeless. The children are in the care on their godmother.    Kristine Linea, MD 03/14/2016, 11:38 AM

## 2016-03-14 NOTE — [facility] Group Notes (Signed)
[facility]   03/14/2016 1 PM  ?  Participation Quality: Alert, Appropriate and Oriented   Mood/Affect: Depressed and Flat   Thoughts of Suicide: Pt denies SI/HI   Will you contract for safety? Yes   Current AVH: Pt denies   Plan for Discharge/Comments: Pt attended discharge planning group and actively participated in group. CSW provided pt with today's workbook. Pt reports her plan is to see and help her children, but pt soon bcame angry and left the room due to her anger at not being discharged.  Transportation Means: Pt reports access to transportation   Supports: No supports mentioned at this time     Dorothe PeaJonathan F. Shaye Lagace, MSW, LCSWA, LCAS

## 2016-03-15 MED ORDER — QUETIAPINE FUMARATE 200 MG PO TABS
200.0000 mg | ORAL_TABLET | Freq: Three times a day (TID) | ORAL | Status: DC | PRN
  Filled 2016-03-15: qty 1

## 2016-03-15 NOTE — [facility] Group Notes (Signed)
[facility] Group Notes:  (Nursing/MHT/Case Management/Adjunct)  Date:  03/15/2016  Time:  3:23 AM  Type of Therapy:  Group Therapy  Participation Level:  Did Not Attend   Summary of Progress/Problems:  Veva Holesshley Imani Mayrani Khamis 03/15/2016, 3:23 AM

## 2016-03-15 NOTE — Plan of Care (Signed)
Problem: Health Behavior/Discharge Planning: Goal: Ability to make decisions will improve Outcome: Progressing Patient has decided to start taking her medication.
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
## 2016-03-15 NOTE — [facility] Group Notes (Signed)
[facility] LCSW Group Therapy   03/15/2016 9:1693m  Type of Therapy: Group Therapy   Participation Level: Active   Participation Quality: Attentive, Sharing and Supportive   Affect: Appropriate  Cognitive: Alert and Oriented   Insight: Developing/Improving and Engaged   Engagement in Therapy: Developing/Improving and Engaged   Modes of Intervention: Clarification, Confrontation, Discussion, Education, Exploration,  Limit-setting, Orientation, Problem-solving, Rapport Building, Dance movement psychotherapisteality Testing, Socialization and Support  Summary of Progress/Problems: The topic for group therapy was feelings about diagnosis. Pt actively participated in group discussion on their past and current diagnosis and how they feel towards this. Pt also identified how society and family members judge them, based on their diagnosis as well as stereotypes and stigmas. Pt shared the pt's diagnosis is depression and that this was scary to the pt.  Pt shared the pt did not know what plan to take to treat the pt's diagnosis, except "just go home".  Pt was polite and cooperative with the CSW and other group members and focused and attentive to the topics discussed and the sharing of others, but occasionally became agitated and would raise her voice.    Dorothe PeaJonathan F. Kiyra Slaubaugh, LCSWA, LCAS

## 2016-03-15 NOTE — Plan of Care (Signed)
Problem: Health Behavior/Discharge Planning: Goal: Compliance with therapeutic regimen will improve Outcome: Progressing Patient was compliant with with Flagyl
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
## 2016-03-15 NOTE — Plan of Care (Signed)
Problem: Coping: Goal: Ability to cope will improve Outcome: Not Progressing Patient demanding to be discharged. Unable to utilize coping skills.

## 2016-03-15 NOTE — Progress Notes (Signed)
[facility] MD Progress Note  03/15/2016 10:27 AM Karina Anderson  MRN:  [id number]  Subjective:  Ms. Loberg is much more pleasant polite today. She was able to participate in the interview. Yesterday she was agitated loud and difficult to redirect. She was angry and demanding immediate discharge. Child protective services are involved in her case. Her children are in the care of the neighbor until she gets better. She accepts some medications and seems to tolerate them well. She refused Vistaril and metronidazole yesterday. She started participating in programming.  Principal Problem: Bipolar affective disorder, manic, severe, with psychotic behavior (HCC) Diagnosis:   Patient Active Problem List   Diagnosis Date Noted  . Tobacco use disorder [F17.200] 03/13/2016  . Cannabis use disorder, moderate, dependence (HCC) [F12.20] 03/13/2016  . Bipolar affective disorder, manic, severe, with psychotic behavior (HCC) [F31.2] 03/12/2016  . Seizures (HCC) [R56.9] 03/11/2016  . Involuntary commitment [Z04.6] 03/11/2016   Total Time spent with patient: 20 minutes  Past Psychiatric History: Bipolar disorder.  Past Medical History:  Past Medical History  Diagnosis Date  . Seizures (HCC)   . Diabetes mellitus without complication (HCC)    History reviewed. No pertinent past surgical history. Family History: History reviewed. No pertinent family history. Family Psychiatric  History: See H&P. Social History:  History  Alcohol Use No     History  Drug Use No    Social History   Social History  . Marital Status: Single    Spouse Name: N/A  . Number of Children: N/A  . Years of Education: N/A   Social History Main Topics  . Smoking status: Current Every Day Smoker -- 0.50 packs/day    Types: Cigarettes  . Smokeless tobacco: None  . Alcohol Use: No  . Drug Use: No  . Sexual Activity: Not Asked   Other Topics Concern  . None   Social History Narrative   Additional Social History:                          Sleep: Fair  Appetite:  Fair  Current Medications: Current Facility-Administered Medications  Medication Dose Route Frequency Provider Last Rate Last Dose  . acetaminophen (TYLENOL) tablet 650 mg  650 mg Oral Q6H PRN Audery Amel, MD   650 mg at 03/14/16 1542  . alum & mag hydroxide-simeth (MAALOX/MYLANTA) 200-200-20 MG/5ML suspension 30 mL  30 mL Oral Q4H PRN Audery Amel, MD      . clonazePAM (KLONOPIN) tablet 1 mg  1 mg Oral TID PRN Shari Prows, MD   1 mg at 03/14/16 1742  . feeding supplement (ENSURE ENLIVE) (ENSURE ENLIVE) liquid 237 mL  237 mL Oral BID BM Therman Hughlett B Rooney Gladwin, MD   237 mL at 03/14/16 1017  . haloperidol (HALDOL) tablet 5 mg  5 mg Oral Q6H PRN Shari Prows, MD   5 mg at 03/14/16 2024  . hydrOXYzine (ATARAX/VISTARIL) tablet 50 mg  50 mg Oral Q6H PRN Sharie Amorin B Tennyson Kallen, MD   50 mg at 03/14/16 1542  . magnesium hydroxide (MILK OF MAGNESIA) suspension 30 mL  30 mL Oral Daily PRN Audery Amel, MD      . metroNIDAZOLE (FLAGYL) tablet 250 mg  250 mg Oral Q6H Deztiny Sarra B Bret Vanessen, MD   250 mg at 03/15/16 0644  . nicotine (NICODERM CQ - dosed in mg/24 hours) patch 21 mg  21 mg Transdermal Daily Audery Amel, MD   Stopped at 03/14/16  [id number]  . phenytoin (DILANTIN) ER capsule 100 mg  100 mg Oral TID Audery AmelJohn T Clapacs, MD   100 mg at 03/15/16 0848  . QUEtiapine (SEROQUEL) tablet 400 mg  400 mg Oral QHS Shari ProwsJolanta B Amaura Authier, MD   400 mg at 03/14/16 2115    Lab Results: No results found for this or any previous visit (from the past 48 hour(s)).  Blood Alcohol level:  Lab Results  Component Value Date   ETH <5 03/11/2016    Metabolic Disorder Labs: Lab Results  Component Value Date   HGBA1C 4.9 03/12/2016   Lab Results  Component Value Date   PROLACTIN 50.4* 03/12/2016   Lab Results  Component Value Date   CHOL 174 03/12/2016   TRIG 75 03/12/2016   HDL 37* 03/12/2016   CHOLHDL 4.7 03/12/2016   VLDL 15 03/12/2016    LDLCALC 122* 03/12/2016    Physical Findings: AIMS: Facial and Oral Movements Muscles of Facial Expression: None, normal Lips and Perioral Area: None, normal Jaw: None, normal Tongue: None, normal,Extremity Movements Upper (arms, wrists, hands, fingers): None, normal Lower (legs, knees, ankles, toes): None, normal, Trunk Movements Neck, shoulders, hips: None, normal, Overall Severity Severity of abnormal movements (highest score from questions above): None, normal Incapacitation due to abnormal movements: None, normal Patient's awareness of abnormal movements (rate only patient's report): No Awareness, Dental Status Current problems with teeth and/or dentures?: Yes Does patient usually wear dentures?: No  CIWA:    COWS:     Musculoskeletal: Strength & Muscle Tone: within normal limits Gait & Station: normal Patient leans: N/A  Psychiatric Specialty Exam: Physical Exam  Nursing note and vitals reviewed.   Review of Systems  Psychiatric/Behavioral: Positive for hallucinations and substance abuse.  All other systems reviewed and are negative.   Blood pressure 85/66, pulse 105, temperature 98 F (36.7 C), temperature source Oral, resp. rate 16, height 5\' 4"  (1.626 m), weight 56.246 kg (124 lb), last menstrual period 03/11/2016, SpO2 100 %.Body mass index is 21.27 kg/(m^2).  General Appearance: Fairly Groomed  Eye Contact:  Minimal  Speech:  Clear and Coherent  Volume:  Normal  Mood:  Dysphoric  Affect:  Blunt  Thought Process:  Disorganized  Orientation:  Full (Time, Place, and Person)  Thought Content:  Delusions and Paranoid Ideation  Suicidal Thoughts:  No  Homicidal Thoughts:  No  Memory:  Immediate;   Fair Recent;   Fair Remote;   Fair  Judgement:  Impaired  Insight:  Lacking  Psychomotor Activity:  Decreased  Concentration:  Concentration: Fair and Attention Span: Fair  Recall:  [employer]Fair  Fund of Knowledge:  Fair  Language:  Fair  Akathisia:  No  Handed:  Right   AIMS (if indicated):     Assets:  Communication Skills Desire for Improvement Financial Resources/Insurance Housing Physical Health Resilience Social Support  ADL's:  Intact  Cognition:  WNL  Sleep:  Number of Hours: 7.5     Treatment Plan Summary: Daily contact with patient to assess and evaluate symptoms and progress in treatment and Medication management   Ms. Baskins is a 45 year old female with a history of psychosis and mood instability admitted for worsening of symptoms and bizarre dangerous behavior.   1. Agitation. This has resolved.  2. Mood. The patient was restarted on Seroquel for presumed bipolar disorder. We increased to 400 mg nightly.  3. Insomnia. She is on trazodone.  4. Seizures. The patient has been treated with Dilantin. Dilantin level is low at 5.3.  5. Smoking. She refuses a patch.   6. Substance abuse. The patient minimizes her problems and declines treatment.  7. Metabolic syndrome monitoring. Lipid profile, TSH, hemoglobin A1c are normal. Prolactin 50.4.  8. Social. CPS referral was made over the weekend by the Child psychotherapist. SW spoke with Stage manager.  9. Trichomonas infection. Will start Metronizadol.  10. Disposition. To be established. Reportedly the patient and her children are homeless. The children are in the care on their godmother.   Kristine Linea, MD 03/15/2016, 10:27 AM

## 2016-03-15 NOTE — Progress Notes (Signed)
Patient was quiet this morning and was seen in her room and milieu. She took her medications without any issues this morning. However at about, 3:15p.m, Patient began to get anxious, angry and kept following this writer behind demanding to be discharged. She began asking to get her Dilantin so that she could leave this place. "Give me my Dilantin so that I can go." At one point she sat on the floor outside the med room waiting. "Why are you doing this, you are doing me wrong, this place is making me crazy." Patient talked to, reassured, redirected, provided support and PRN medication(Klonopin 1mg ) PO administered at 1450 with minimal effect.

## 2016-03-15 NOTE — [facility] Group Notes (Signed)
[facility] Group Notes:  (Nursing/MHT/Case Management/Adjunct)  Date:  03/15/2016  Time:  11:20 PM  Type of Therapy:  Psychoeducational Skills  Participation Level:  Active  Participation Quality:  Appropriate  Affect:  Appropriate  Cognitive:  Appropriate  Insight:  Appropriate  Engagement in Group:  Engaged  Modes of Intervention:  Discussion, Education and Support  Summary of Progress/Problems:  Karina Anderson 03/15/2016, 11:20 PM

## 2016-03-15 NOTE — [facility] Group Notes (Signed)
Goals Group Date/Time: 03/15/16 9am Type of Therapy and Topic: Group Therapy: Goals Group: SMART Goals   Participation Level: Moderate  Description of Group:    The purpose of a daily goals group is to assist and guide patients in setting recovery/wellness-related goals. The objective is to set goals as they relate to the crisis in which they were admitted. Patients will be using SMART goal modalities to set measurable goals. Characteristics of realistic goals will be discussed and patients will be assisted in setting and processing how one will reach their goal. Facilitator will also assist patients in applying interventions and coping skills learned in psycho-education groups to the SMART goal and process how one will achieve defined goal.   Therapeutic Goals:   -Patients will develop and document one goal related to or their crisis in which brought them into treatment.  -Patients will be guided by LCSW using SMART goal setting modality in how to set a measurable, attainable, realistic and time sensitive goal.  -Patients will process barriers in reaching goal.  -Patients will process interventions in how to overcome and successful in reaching goal.   Patient's Goal: Pt shared that the pt's goal is to discharge home and spend time with her children.  Pt also repeated she wants to "teach her children right".  Pt was reporting she does hope to discharge sooner and soon became agitated and began to raise her voice. Pt was easily re-directable and cooperative with the CSW and other group members and focused and attentive to the topics discussed and the sharing of others.  Pt seems to have made great improvement during group, as evidenced by a pleasant affect when focusing on the sharing of others, answering when prompted and pt presents as increasingly energetic, as compared to previous sessions.    Therapeutic Modalities:  Motivational Interviewing  ChiropractorCognitive Behavioral Therapy  Crisis  Intervention Model  SMART goals setting   Dorothe PeaJonathan F. Zubair Lofton, LCSWA, LCAS

## 2016-03-15 NOTE — Progress Notes (Signed)
D: Patient has been somewhat irritable this evening. She is fixated on discharge. She did apologize though for irritability. Easily redirectable. Denies SI/HI/AVH. Attended group. Denies pain.  A: Medication given with education. Encouragement provided.  R: Patient was compliant with medication. She has remained calm and cooperative. Safety maintained with 15 min checks.

## 2016-03-16 MED ORDER — QUETIAPINE FUMARATE 200 MG PO TABS
600.0000 mg | ORAL_TABLET | Freq: Every day | ORAL | Status: DC
  Administered 2016-03-16: 600 mg via ORAL
  Filled 2016-03-16: qty 3

## 2016-03-16 NOTE — Tx Team (Signed)
Interdisciplinary Treatment Plan Update (Adult)  Date:  03/15/2016 Time Reviewed:  4:49 PM  Progress in Treatment: Attending groups: Yes. Participating in groups:  Yes. Taking medication as prescribed:  Yes. Tolerating medication:  Yes. Family/Significant othe contact made:  Yes, individual(s) contacted:   Reita ClicheBobby, God-father Patient understands diagnosis:  No. Discussing patient identified problems/goals with staff:  Yes. Medical problems stabilized or resolved:  Yes. Denies suicidal/homicidal ideation: Yes. Issues/concerns per patient self-inventory:  Yes. Other:  New problem(s) identified: No, Describe:     Discharge Plan or Barriers:TBD, CSW assessing.  Reason for Continuation of Hospitalization: Anxiety Delusions  Mania Medication stabilization Other; describe Labile Disorganization  Comments:45 year old woman presented to the emergency room after being picked up by law enforcement in bizarre behavior in public. She was throwing a microwave oven out into the [place]. She appeared to be disorganized and bizarre in her thinking. On evaluation in the emergency room she continued to be disorganized with labile mood and difficulty explaining her recent situation. Denied suicidal ideation. She was aggressive at times when she first presented. No clear specific known stress. Minimizes the use of drugs and alcohol saying she hasn't had any marijuana recently. Not currently getting any outpatient treatment.  Child Protective Services are now involved with her 2 daughters.  Estimated length of stay:3-4 days  New goal(s):  Review of initial/current patient goals per problem list:     Attendees: Patient:  Karina Anderson 7/19/20174:49 PM  Family:   7/19/20174:49 PM  Physician:  Kristine LineaJolanta Pucilowska 7/19/20174:49 PM  Nursing:   Hulan AmatoGwen Farrish, RN 7/19/20174:49 PM  Case Manager:   7/19/20174:49 PM  Counselor:  Jake SharkSara Sabina Beavers, LCSW 7/19/20174:49 PM  Other:   7/19/20174:49 PM  Other:    7/19/20174:49 PM  Other:   7/19/20174:49 PM  Other:  7/19/20174:49 PM  Other:  7/19/20174:49 PM  Other:  7/19/20174:49 PM  Other:  7/19/20174:49 PM  Other:  7/19/20174:49 PM  Other:  7/19/20174:49 PM  Other:   7/19/20174:49 PM   Scribe for Treatment Team:   Glennon MacLaws, Evelette Hollern P, 03/16/2016, 4:49 PM, MSW, LCSW

## 2016-03-16 NOTE — [facility] Group Notes (Signed)
[facility] Group Notes:  (Nursing/MHT/Case Management/Adjunct)  Date:  03/16/2016  Time:  4:06 PM  Type of Therapy:  Psychoeducational Skills  Participation Level:  Active  Participation Quality:  Attentive  Affect:  Angry  Cognitive:  Alert  Insight:  None  Engagement in Group:  Engaged  Modes of Intervention:  Exploration  Summary of Progress/Problems:  Mayra NeerJackie L Chavonne Sforza 03/16/2016, 4:06 PM

## 2016-03-16 NOTE — Progress Notes (Signed)
Patient is labile and delusional.  DSS came in to see her today to discuss her children.  Patient told the DSS worker that the staff harasses her in the evening and practices witchcraft and that is way she has outburst during the evening.  Reported to this writer "I just want to go home.  Dr. Jennet MaduroPucilowska informed and Dr. Demetrius CharityP went to talk with patient. After conversation with doctor patient could be heard at the nurses station crying.  Medication compliant.  Did not drink ensures today brought both back and asked that they be placed in the refrigerator.  Safety maintained.

## 2016-03-16 NOTE — Progress Notes (Signed)
[facility] MD Progress Note  03/16/2016 9:37 AM Karina Anderson  MRN:  [id number]  Subjective:  Karina Anderson remains paranoid and disorganized. She is usually pleasant in the morning but last night she was yelling, screaming, slamming doors. She refused when necessary Haldol or Seroquel. Agitation was not obtained with clonazepam or Vistaril. She accepts Dilantin and Seroquel at night. Today she is Ready to go. She seems to be unable to participate in discharge planning. Her 2 teenage children are in the custody of child protective services for the moment. The patient must show reasonable behavior before discharge.  Principal Problem: Bipolar affective disorder, manic, severe, with psychotic behavior (HCC) Diagnosis:   Patient Active Problem List   Diagnosis Date Noted  . Tobacco use disorder [F17.200] 03/13/2016  . Cannabis use disorder, moderate, dependence (HCC) [F12.20] 03/13/2016  . Bipolar affective disorder, manic, severe, with psychotic behavior (HCC) [F31.2] 03/12/2016  . Seizures (HCC) [R56.9] 03/11/2016  . Involuntary commitment [Z04.6] 03/11/2016   Total Time spent with patient: 20 minutes  Past Psychiatric History: Bipolar disorder.  Past Medical History:  Past Medical History  Diagnosis Date  . Seizures (HCC)   . Diabetes mellitus without complication (HCC)    History reviewed. No pertinent past surgical history. Family History: History reviewed. No pertinent family history. Family Psychiatric  History: See H&P. Social History:  History  Alcohol Use No     History  Drug Use No    Social History   Social History  . Marital Status: Single    Spouse Name: N/A  . Number of Children: N/A  . Years of Education: N/A   Social History Main Topics  . Smoking status: Current Every Day Smoker -- 0.50 packs/day    Types: Cigarettes  . Smokeless tobacco: None  . Alcohol Use: No  . Drug Use: No  . Sexual Activity: Not Asked   Other Topics Concern  . None   Social History  Narrative   Additional Social History:                         Sleep: Fair  Appetite:  Fair  Current Medications: Current Facility-Administered Medications  Medication Dose Route Frequency Provider Last Rate Last Dose  . acetaminophen (TYLENOL) tablet 650 mg  650 mg Oral Q6H PRN Audery Amel, MD   650 mg at 03/16/16 0158  . alum & mag hydroxide-simeth (MAALOX/MYLANTA) 200-200-20 MG/5ML suspension 30 mL  30 mL Oral Q4H PRN Audery Amel, MD      . clonazePAM (KLONOPIN) tablet 1 mg  1 mg Oral TID PRN Shari Prows, MD   1 mg at 03/15/16 1451  . feeding supplement (ENSURE ENLIVE) (ENSURE ENLIVE) liquid 237 mL  237 mL Oral BID BM Nadyne Gariepy B Etienne Mowers, MD   237 mL at 03/15/16 1450  . haloperidol (HALDOL) tablet 5 mg  5 mg Oral Q6H PRN Shari Prows, MD   5 mg at 03/14/16 2024  . hydrOXYzine (ATARAX/VISTARIL) tablet 50 mg  50 mg Oral Q6H PRN Shari Prows, MD   50 mg at 03/16/16 0157  . magnesium hydroxide (MILK OF MAGNESIA) suspension 30 mL  30 mL Oral Daily PRN Audery Amel, MD      . metroNIDAZOLE (FLAGYL) tablet 250 mg  250 mg Oral Q6H Zelia Yzaguirre B Rosa Wyly, MD   250 mg at 03/16/16 0603  . nicotine (NICODERM CQ - dosed in mg/24 hours) patch 21 mg  21 mg Transdermal Daily  Audery AmelJohn T Clapacs, MD   Stopped at 03/14/16 [phone]  . phenytoin (DILANTIN) ER capsule 100 mg  100 mg Oral TID Audery AmelJohn T Clapacs, MD   100 mg at 03/15/16 2144  . QUEtiapine (SEROQUEL) tablet 200 mg  200 mg Oral TID PRN Tierany Appleby B Shaasia Odle, MD      . QUEtiapine (SEROQUEL) tablet 400 mg  400 mg Oral QHS Cassy Sprowl B Romin Divita, MD   400 mg at 03/15/16 2144    Lab Results: No results found for this or any previous visit (from the past 48 hour(s)).  Blood Alcohol level:  Lab Results  Component Value Date   ETH <5 03/11/2016    Metabolic Disorder Labs: Lab Results  Component Value Date   HGBA1C 4.9 03/12/2016   Lab Results  Component Value Date   PROLACTIN 50.4* 03/12/2016   Lab Results   Component Value Date   CHOL 174 03/12/2016   TRIG 75 03/12/2016   HDL 37* 03/12/2016   CHOLHDL 4.7 03/12/2016   VLDL 15 03/12/2016   LDLCALC 122* 03/12/2016    Physical Findings: AIMS: Facial and Oral Movements Muscles of Facial Expression: None, normal Lips and Perioral Area: None, normal Jaw: None, normal Tongue: None, normal,Extremity Movements Upper (arms, wrists, hands, fingers): None, normal Lower (legs, knees, ankles, toes): None, normal, Trunk Movements Neck, shoulders, hips: None, normal, Overall Severity Severity of abnormal movements (highest score from questions above): None, normal Incapacitation due to abnormal movements: None, normal Patient's awareness of abnormal movements (rate only patient's report): No Awareness, Dental Status Current problems with teeth and/or dentures?: Yes Does patient usually wear dentures?: No  CIWA:    COWS:     Musculoskeletal: Strength & Muscle Tone: within normal limits Gait & Station: normal Patient leans: N/A  Psychiatric Specialty Exam: Physical Exam  Nursing note and vitals reviewed.   Review of Systems  Psychiatric/Behavioral: Positive for hallucinations.  All other systems reviewed and are negative.   Blood pressure 108/80, pulse 95, temperature 98 F (36.7 C), temperature source Oral, resp. rate 18, height 5\' 4"  (1.626 m), weight 56.246 kg (124 lb), last menstrual period 03/11/2016, SpO2 100 %.Body mass index is 21.27 kg/(m^2).  General Appearance: Casual  Eye Contact:  Good  Speech:  Clear and Coherent  Volume:  Normal  Mood:  Angry, Dysphoric and Irritable  Affect:  Congruent  Thought Process:  Disorganized  Orientation:  Other:  To person and place only.  Thought Content:  Illogical, Delusions and Paranoid Ideation  Suicidal Thoughts:  No  Homicidal Thoughts:  No  Memory:  Immediate;   Poor Recent;   Poor Remote;   Poor  Judgement:  Poor  Insight:  Lacking  Psychomotor Activity:  Increased   Concentration:  Concentration: Poor and Attention Span: Poor  Recall:  Poor  Fund of Knowledge:  Poor  Language:  Poor  Akathisia:  No  Handed:  Right  AIMS (if indicated):     Assets:  Communication Skills Desire for Improvement Financial Resources/Insurance Housing Physical Health Resilience Social Support  ADL's:  Intact  Cognition:  WNL  Sleep:  Number of Hours: 6.25     Treatment Plan Summary: Daily contact with patient to assess and evaluate symptoms and progress in treatment and Medication management   Ms. Furry is a 45 year old female with a history of psychosis and mood instability admitted for worsening of symptoms and bizarre dangerous behavior.   1. Agitation. Seroquel as needed is available.  2. Mood. The patient was restarted on  Seroquel for presumed bipolar disorder. We will further increase Seroquel to 600 mg nightly.  3. Insomnia. She is on trazodone.  4. Seizures. The patient has been treated with Dilantin. Dilantin level is low at 5.3.   5. Smoking. She refuses a patch.   6. Substance abuse. The patient minimizes her problems and declines treatment.  7. Metabolic syndrome monitoring. Lipid profile, TSH, hemoglobin A1c are normal. Prolactin 50.4.  8. Social. CPS referral was made over the weekend by the Child psychotherapist. SW spoke with Stage manager.  9. Trichomonas infection. Will start Metronizadol.  10. Disposition. To be established. Reportedly the patient and her children are homeless. The children are in the care on their godmother.   Kristine Linea, MD 03/16/2016, 9:37 AM

## 2016-03-17 MED ORDER — QUETIAPINE FUMARATE 200 MG PO TABS
800.0000 mg | ORAL_TABLET | Freq: Every day | ORAL | Status: DC
Start: 2016-03-17 — End: 2016-03-29
  Administered 2016-03-17 – 2016-03-28 (×12): 800 mg via ORAL
  Filled 2016-03-17 (×2): qty 4
  Filled 2016-03-17: qty 2
  Filled 2016-03-17 (×11): qty 4

## 2016-03-17 NOTE — [facility] Group Notes (Signed)
[facility] LCSW Group Therapy  03/17/2016 1:14 PM  Type of Therapy:  Group Therapy  Participation Level:  Active  Participation Quality:  Intrusive, Monopolizing.   Affect:  Irritable  Cognitive:  Alert  Insight:  Improving  Engagement in Therapy:  Improving  Modes of Intervention:  Discussion, Education, Socialization and Support  Summary of Progress/Problems: Balance in life: Patients will discuss the concept of balance and how it looks and feels to be unbalanced. Pt will identify areas in their life that is unbalanced and ways to become more balanced.  Pt attended group and stayed the entire time. She discussed wanting to be discharged and not understanding why she is in the hospital. She states "you are causing my baby to have panic attacks everyday. I can sue for that. I have a good memory." She describes balance as taking care of her children and having a career.   Karina Anderson L Yanai Hobson MSW, LCSWA  03/17/2016, 1:14 PM

## 2016-03-17 NOTE — Plan of Care (Signed)
Problem: Coping: Goal: Ability to verbalize feelings will improve Outcome: Not Progressing Pt forwards little in regards to feelings.

## 2016-03-17 NOTE — Progress Notes (Signed)
Labile, delusional, argumentative, demanding. Requires frequent redirection.  Fixated on going home.  Requested to sign 72 hour form. When informed that she could not sign because she was under IVC became agitated and started yelling that she needed to get out of here.  Support offered.  Safety maintained.

## 2016-03-17 NOTE — Progress Notes (Addendum)
[facility] MD Progress Note  03/17/2016 8:31 AM Karina Anderson  MRN:  [id number]  Subjective:  Karina Anderson is pleasant and polite today but was rather agitated yesterday during the meeting with the CPS social worker. We have learned that the patient had been evicted from her apartment and no longer may return there. Social services were unable to identify any family members who could take her children responsibility. The chances are that CPS will seek guardianship of the children. The patient was rather upset about it yesterday. She was also disorganized and accusing [place] of performing witchcraft in the evening. This morning she is placid but also unconcerned about the whole situation. She believes that she can talk to her landlord and get her apartment back. She was able to talk to one of her daughters on the phone yesterday. She does not believe that the kids will be taken away. She accepts Seroquel in the evening and seems to tolerate it well. I tried to have a discussion with her about injectable antipsychotic to improve compliance and increase her chances of keeping her family to get her but the patient does not have enough insight to worry about it. She refused injectable antipsychotic.  Principal Problem: Bipolar affective disorder, manic, severe, with psychotic behavior (HCC) Diagnosis:   Patient Active Problem List   Diagnosis Date Noted  . Tobacco use disorder [F17.200] 03/13/2016  . Cannabis use disorder, moderate, dependence (HCC) [F12.20] 03/13/2016  . Bipolar affective disorder, manic, severe, with psychotic behavior (HCC) [F31.2] 03/12/2016  . Seizures (HCC) [R56.9] 03/11/2016  . Involuntary commitment [Z04.6] 03/11/2016   Total Time spent with patient: 20 minutes  Past Psychiatric History: Bipolar disorder.  Past Medical History:  Past Medical History  Diagnosis Date  . Seizures (HCC)   . Diabetes mellitus without complication (HCC)    History reviewed. No pertinent past surgical  history. Family History: History reviewed. No pertinent family history. Family Psychiatric  History: None reported. Social History:  History  Alcohol Use No     History  Drug Use No    Social History   Social History  . Marital Status: Single    Spouse Name: N/A  . Number of Children: N/A  . Years of Education: N/A   Social History Main Topics  . Smoking status: Current Every Day Smoker -- 0.50 packs/day    Types: Cigarettes  . Smokeless tobacco: None  . Alcohol Use: No  . Drug Use: No  . Sexual Activity: Not Asked   Other Topics Concern  . None   Social History Narrative   Additional Social History:                         Sleep: Fair  Appetite:  Fair  Current Medications: Current Facility-Administered Medications  Medication Dose Route Frequency Provider Last Rate Last Dose  . acetaminophen (TYLENOL) tablet 650 mg  650 mg Oral Q6H PRN Audery Amel, MD   650 mg at 03/16/16 2035  . alum & mag hydroxide-simeth (MAALOX/MYLANTA) 200-200-20 MG/5ML suspension 30 mL  30 mL Oral Q4H PRN Audery Amel, MD      . clonazePAM (KLONOPIN) tablet 1 mg  1 mg Oral TID PRN Shari Prows, MD   1 mg at 03/15/16 1451  . feeding supplement (ENSURE ENLIVE) (ENSURE ENLIVE) liquid 237 mL  237 mL Oral BID BM Shanyla [name], MD   237 mL at 03/15/16 1450  . haloperidol (HALDOL) tablet 5 mg  5 mg Oral Q6H PRN Shari Prows, MD   5 mg at 03/14/16 2024  . hydrOXYzine (ATARAX/VISTARIL) tablet 50 mg  50 mg Oral Q6H PRN Shari Prows, MD   50 mg at 03/16/16 0157  . magnesium hydroxide (MILK OF MAGNESIA) suspension 30 mL  30 mL Oral Daily PRN Audery Amel, MD      . metroNIDAZOLE (FLAGYL) tablet 250 mg  250 mg Oral Q6H [name] B Bodee Lafoe, MD   250 mg at 03/17/16 0649  . nicotine (NICODERM CQ - dosed in mg/24 hours) patch 21 mg  21 mg Transdermal Daily Audery Amel, MD   Stopped at 03/14/16 [phone]  . phenytoin (DILANTIN) ER capsule 100 mg  100 mg Oral TID  Audery Amel, MD   100 mg at 03/16/16 2210  . QUEtiapine (SEROQUEL) tablet 200 mg  200 mg Oral TID PRN [name] B Tal Neer, MD      . QUEtiapine (SEROQUEL) tablet 600 mg  600 mg Oral QHS Bishop Vanderwerf B Orvella Digiulio, MD   600 mg at 03/16/16 2210    Lab Results: No results found for this or any previous visit (from the past 48 hour(s)).  Blood Alcohol level:  Lab Results  Component Value Date   ETH <5 03/11/2016    Metabolic Disorder Labs: Lab Results  Component Value Date   HGBA1C 4.9 03/12/2016   Lab Results  Component Value Date   PROLACTIN 50.4* 03/12/2016   Lab Results  Component Value Date   CHOL 174 03/12/2016   TRIG 75 03/12/2016   HDL 37* 03/12/2016   CHOLHDL 4.7 03/12/2016   VLDL 15 03/12/2016   LDLCALC 122* 03/12/2016    Physical Findings: AIMS: Facial and Oral Movements Muscles of Facial Expression: None, normal Lips and Perioral Area: None, normal Jaw: None, normal Tongue: None, normal,Extremity Movements Upper (arms, wrists, hands, fingers): None, normal Lower (legs, knees, ankles, toes): None, normal, Trunk Movements Neck, shoulders, hips: None, normal, Overall Severity Severity of abnormal movements (highest score from questions above): None, normal Incapacitation due to abnormal movements: None, normal Patient's awareness of abnormal movements (rate only patient's report): No Awareness, Dental Status Current problems with teeth and/or dentures?: Yes Does patient usually wear dentures?: No  CIWA:    COWS:     Musculoskeletal: Strength & Muscle Tone: within normal limits Gait & Station: normal Patient leans: N/A  Psychiatric Specialty Exam: Physical Exam  Nursing note and vitals reviewed.   Review of Systems  Psychiatric/Behavioral: Positive for hallucinations.  All other systems reviewed and are negative.   Blood pressure 108/85, pulse 104, temperature 98 F (36.7 C), temperature source Oral, resp. rate 18, height  (1.626 m), weight 56.246  kg (124 lb), last menstrual period 03/11/2016, SpO2 100 %.Body mass index is 21.27 kg/(m^2).  General Appearance: Casual  Eye Contact:  Good  Speech:  Pressured  Volume:  Increased  Mood:  Angry, Dysphoric and Irritable  Affect:  Labile  Thought Process:  Disorganized  Orientation:  Other:  To person and place only.  Thought Content:  Delusions and Paranoid Ideation  Suicidal Thoughts:  No  Homicidal Thoughts:  No  Memory:  Immediate;   Poor Recent;   Poor Remote;   Poor  Judgement:  Poor  Insight:  Lacking  Psychomotor Activity:  Increased  Concentration:  Concentration: Poor and Attention Span: Poor  Recall:  Poor  Fund of Knowledge:  Poor  Language:  Fair  Akathisia:  No  Handed:  Right  AIMS (if indicated):     Assets:  Communication Skills Desire for Improvement Financial Resources/Insurance Physical Health Resilience  ADL's:  Intact  Cognition:  WNL  Sleep:  Number of Hours: 7     Treatment Plan Summary: Daily contact with patient to assess and evaluate symptoms and progress in treatment and Medication management   Karina Anderson is a 45 year old female with a history of psychosis and mood instability admitted for worsening of symptoms and bizarre dangerous behavior.   1. Agitation. Seroquel as needed is available.  2. Mood. The patient was restarted on Seroquel for presumed bipolar disorder. We will continue to increase Seroquel to 800 mg nightly.  3. Insomnia. She is on trazodone.  4. Seizures. The patient has been treated with Dilantin. Dilantin level is low at 5.3.   5. Smoking. She refuses a patch and asks for a cigarette.  6. Substance abuse. The patient minimizes her problems and declines treatment.  7. Metabolic syndrome monitoring. Lipid profile, TSH, hemoglobin A1c are normal. Prolactin 50.4.  8. Social. CPS referral was made. CPS worker visited with the patient last night to inform her about eviction and kids situation. The patient is  unconcerend.  9. Trichomonas infection. Will start Metronizadol.  10. Disposition. To be established. Reportedly the patient and her children are homeless. The children are in the care of relatives.  [facility]  Kristine LineaJolanta Garek Schuneman, MD 03/17/2016, 8:31 AM

## 2016-03-17 NOTE — Progress Notes (Signed)
D: Observed pt walking the hallway. Patient alert and oriented x4. Patient denies SI/HI/AVH. Pt affect is labile. Pt stated her day was "ok." Pt at first overly pleasant, than got very irritable talking about going home and "they can't keep me here...it's illegal.", and then pt was very pleasant again. Pt denied feeling anxious and depressed. Pt had no complaints.  A: Offered active listening and support. Provided therapeutic communication. Administered scheduled medications.  R: Pt cooperative. Pt medication compliant. Pt did not want to get up for 12am flagyl dose. Will continue Q15 min. checks. Safety maintained.

## 2016-03-17 NOTE — Tx Team (Signed)
Interdisciplinary Treatment Plan Update (Adult)  Date:  03/17/2016 Time Reviewed:  3:51 PM  Progress in Treatment: Attending groups: Yes. Participating in groups:  Yes. Taking medication as prescribed:  No. Tolerating medication:    Family/Significant othe contact made:  Yes, individual(s) contacted:  Jearld Adjutant Patient understands diagnosis:  No. Discussing patient identified problems/goals with staff:  Yes. Medical problems stabilized or resolved:  Yes. Denies suicidal/homicidal ideation: No. Issues/concerns per patient self-inventory:  No. Other:  New problem(s) identified: N/A  Discharge Plan or Barriers: Pt is currently unstable and is unable to engage in discharge planning. CSW submitted application for [facility].   Reason for Continuation of Hospitalization: Aggression Anxiety Delusions  Depression Mania Medication stabilization  Comments: N/A  Estimated length of stay: 7 days  New goal(s): N/A  Review of initial/current patient goals per problem list:   1.? Goal(s): Patient will participate in aftercare plan o Met:? No o Target date: at discharge  o As evidenced by: Patient will participate within aftercare plan AEB aftercare provider and housing plan at discharge being identified.  ? 2.? Goal (s): Patient will exhibit decreased depressive symptoms and suicidal ideations. o Met:? No o ?Target date: at discharge  o As evidenced by: Patient will utilize self rating of depression at 3 or below and demonstrate decreased signs of depression or be deemed stable for discharge by MD.  ? 3.? Goal(s): Patient will demonstrate decreased signs and symptoms of anxiety. o Met:? No o Target date: at discharge  o As evidenced by: Patient will utilize self rating of anxiety at 3 or below and demonstrated decreased signs of anxiety, or be deemed stable for discharge by MD   5.? Goal (s): Patient will demonstrate decreased symptoms of Mania. o Met:?No  o ?Target date: at  discharge  o As evidenced by: Patient will not endorse signs of mania or be deemed stable for discharge by MD.    Attendees: Patient:  Karina Anderson 7/20/20173:51 PM  Family:   7/20/20173:51 PM  Physician:  Dr. Bary Leriche, MD 7/20/20173:51 PM  Nursing:   Victorino Sparrow. Belenda Cruise, RN 7/20/20173:51 PM  Case Manager:   7/20/20173:51 PM  Counselor:   7/20/20173:51 PM  Other:  Anthoney Harada, MSW, LCSWA 7/20/20173:51 PM  Other:   7/20/20173:51 PM  Other:   7/20/20173:51 PM  Other:  7/20/20173:51 PM  Other:  7/20/20173:51 PM  Other:  7/20/20173:51 PM  Other:  7/20/20173:51 PM  Other:  7/20/20173:51 PM  Other:  7/20/20173:51 PM  Other:   7/20/20173:51 PM   Scribe for Treatment Team:    Jolaine Click, MSW, LCSWA  03/17/2016, 3:51 PM

## 2016-03-18 NOTE — Progress Notes (Signed)
Pt denies any SI/HI/AVH. Attended evening group. Appropriate with staff and peers during interaction. Affect slightly irritable. Forwards little. States that she doesn't think it is right that "they are keeping me here". Denies pain. Voices no additional concerns at this time. Safety maintained. Will continue to monitor.

## 2016-03-18 NOTE — [facility] Group Notes (Signed)
[facility] Group Notes:  (Nursing/MHT/Case Management/Adjunct)  Date:  03/18/2016  Time:  2:51 AM  Type of Therapy:  Psychoeducational Skills  Participation Level:  Active  Participation Quality:  Appropriate, Attentive, Sharing and Supportive  Affect:  Appropriate  Cognitive:  Alert and Appropriate  Insight:  Appropriate  Engagement in Group:  Engaged and Supportive  Modes of Intervention:  Discussion, Socialization and Support  Summary of Progress/Problems:  Chancy MilroyLaquanda Y Maxyne Derocher 03/18/2016, 2:51 AM

## 2016-03-18 NOTE — Progress Notes (Signed)
D: Patient is alert and oriented on the unit this shift. Patient attended   groups today. Patient denies suicidal ideation, homicidal ideation, auditory or visual hallucinations at the present time.  A: Scheduled medications are administered to patient as per MD orders. Emotional support and encouragement are provided. Patient is maintained on q.15 minute safety checks. Patient is informed to notify staff with questions or concerns. R: No adverse medication reactions are noted. Patient is cooperative with medication administration and treatment plan today. Patient is receptive,  Anxious about d/c tomorrow and cooperative on the unit at this time. Patient does not  Interact  with others on the unit this shift. Patient contracts for safety at this time. Patient remains safe at this time. Anxiety 6/10, depression 2/10

## 2016-03-18 NOTE — Progress Notes (Signed)
Patient ID: Karina Anderson, female   DOB: 1971/05/23, 45 y.o.   MRN: [id number]  CSW spoke with Kar[facility] from [facility]. Pt was placed on wait list on 03/17/16.   Daisy FloroCandace L Katara Griner MSW, LCSWA  03/18/2016 10:37 AM

## 2016-03-18 NOTE — Progress Notes (Signed)
[facility] Progress Note  03/18/2016 12:13 PM Zella BallRobin Vitullo  MRN:  [id number]  Subjective:  Ms. Casper HarrisonStreet is a history of bipolar illness admitted in a manic psychotic episode. The patient reportedly lost her house and CPS is looking into taking over the guardianship of her 2 daughters. The patient has no insight into mental illness. Moreover, she does not believe that she is homeless or that she is about to lose her kids. She accepts medications and participates in programming but is very volatile, especially with her housing and parenting situation is discussed.  Today again the patient started very politely but quickly flew off the handle when the details of her situation were discussed. She believes that she is doing everything she needs to be discharged as she is compliant with medication in programming. She believes that she is here voluntarily  but she is incorrect. She wants to be discharged immediately to pick up her kids and go back home. This clearly a delusional.  Principal Problem: Bipolar affective disorder, manic, severe, with psychotic behavior (HCC) Diagnosis:   Patient Active Problem List   Diagnosis Date Noted  . Tobacco use disorder [F17.200] 03/13/2016  . Cannabis use disorder, moderate, dependence (HCC) [F12.20] 03/13/2016  . Bipolar affective disorder, manic, severe, with psychotic behavior (HCC) [F31.2] 03/12/2016  . Seizures (HCC) [R56.9] 03/11/2016  . Involuntary commitment [Z04.6] 03/11/2016   Total Time spent with patient: 20 minutes  Past Psychiatric History: Bipolar disorder.  Past Medical History:  Past Medical History  Diagnosis Date  . Seizures (HCC)   . Diabetes mellitus without complication (HCC)    History reviewed. No pertinent past surgical history. Family History: History reviewed. No pertinent family history. Family Psychiatric  History: Unknown. Social History:  History  Alcohol Use No     History  Drug Use No    Social History   Social History   . Marital Status: Single    Spouse Name: N/A  . Number of Children: N/A  . Years of Education: N/A   Social History Main Topics  . Smoking status: Current Every Day Smoker -- 0.50 packs/day    Types: Cigarettes  . Smokeless tobacco: None  . Alcohol Use: No  . Drug Use: No  . Sexual Activity: Not Asked   Other Topics Concern  . None   Social History Narrative   Additional Social History:                         Sleep: Fair  Appetite:  Fair  Current Medications: Current Facility-Administered Medications  Medication Dose Route Frequency Provider Last Rate Last Dose  . acetaminophen (TYLENOL) tablet 650 mg  650 mg Oral Q6H PRN Audery AmelJohn T Clapacs, MD   650 mg at 03/16/16 2035  . alum & mag hydroxide-simeth (MAALOX/MYLANTA) 200-200-20 MG/5ML suspension 30 mL  30 mL Oral Q4H PRN Audery AmelJohn T Clapacs, MD      . clonazePAM (KLONOPIN) tablet 1 mg  1 mg Oral TID PRN Shari ProwsJolanta B Lowanda Cashaw, MD   1 mg at 03/15/16 1451  . feeding supplement (ENSURE ENLIVE) (ENSURE ENLIVE) liquid 237 mL  237 mL Oral BID BM Avianah Pellman B Bearl Talarico, MD   237 mL at 03/18/16 1020  . haloperidol (HALDOL) tablet 5 mg  5 mg Oral Q6H PRN Shari ProwsJolanta B Leita Lindbloom, MD   5 mg at 03/14/16 2024  . hydrOXYzine (ATARAX/VISTARIL) tablet 50 mg  50 mg Oral Q6H PRN Orenthal Debski B Clydene Burack, MD   50 mg  at 03/16/16 0157  . magnesium hydroxide (MILK OF MAGNESIA) suspension 30 mL  30 mL Oral Daily PRN Audery Amel, MD      . metroNIDAZOLE (FLAGYL) tablet 250 mg  250 mg Oral Q6H Keasia Dubose B Gedalia Mcmillon, MD   250 mg at 03/18/16 0700  . phenytoin (DILANTIN) ER capsule 100 mg  100 mg Oral TID Audery Amel, MD   100 mg at 03/18/16 0811  . QUEtiapine (SEROQUEL) tablet 200 mg  200 mg Oral TID PRN Arval Brandstetter B Eryc Bodey, MD      . QUEtiapine (SEROQUEL) tablet 800 mg  800 mg Oral QHS Ernestine Langworthy B Pascal Stiggers, MD   800 mg at 03/17/16 2126    Lab Results: No results found for this or any previous visit (from the past 48 hour(s)).  Blood Alcohol level:   Lab Results  Component Value Date   ETH <5 03/11/2016    Metabolic Disorder Labs: Lab Results  Component Value Date   HGBA1C 4.9 03/12/2016   Lab Results  Component Value Date   PROLACTIN 50.4* 03/12/2016   Lab Results  Component Value Date   CHOL 174 03/12/2016   TRIG 75 03/12/2016   HDL 37* 03/12/2016   CHOLHDL 4.7 03/12/2016   VLDL 15 03/12/2016   LDLCALC 122* 03/12/2016    Physical Findings: AIMS: Facial and Oral Movements Muscles of Facial Expression: None, normal Lips and Perioral Area: None, normal Jaw: None, normal Tongue: None, normal,Extremity Movements Upper (arms, wrists, hands, fingers): None, normal Lower (legs, knees, ankles, toes): None, normal, Trunk Movements Neck, shoulders, hips: None, normal, Overall Severity Severity of abnormal movements (highest score from questions above): None, normal Incapacitation due to abnormal movements: None, normal Patient's awareness of abnormal movements (rate only patient's report): No Awareness, Dental Status Current problems with teeth and/or dentures?: Yes Does patient usually wear dentures?: No  CIWA:    COWS:     Musculoskeletal: Strength & Muscle Tone: within normal limits Gait & Station: normal Patient leans: N/A  Psychiatric Specialty Exam: Physical Exam  Nursing note and vitals reviewed.   Review of Systems  Psychiatric/Behavioral: Positive for hallucinations.  All other systems reviewed and are negative.   Blood pressure 100/74, pulse 106, temperature 98 F (36.7 C), temperature source Oral, resp. rate 18, height  (1.626 m), weight 56.246 kg (124 lb), last menstrual period 03/11/2016, SpO2 99 %.Body mass index is 21.27 kg/(m^2).  General Appearance: Casual  Eye Contact:  Good  Speech:  Pressured  Volume:  Increased  Mood:  Angry, Dysphoric and Irritable  Affect:  Labile  Thought Process:  Disorganized  Orientation:  Other:  To person and place only.  Thought Content:  Illogical,  Delusions and Paranoid Ideation  Suicidal Thoughts:  No  Homicidal Thoughts:  No  Memory:  Immediate;   Poor Recent;   Poor Remote;   Poor  Judgement:  Poor  Insight:  Lacking  Psychomotor Activity:  Increased  Concentration:  Concentration: Poor and Attention Span: Poor  Recall:  Poor  Fund of Knowledge:  Poor  Language:  Fair  Akathisia:  No  Handed:  Right  AIMS (if indicated):     Assets:  Communication Skills Desire for Improvement Financial Resources/Insurance Physical Health Resilience Social Support  ADL's:  Intact  Cognition:  WNL  Sleep:  Number of Hours: 7.15     Treatment Plan Summary: Daily contact with patient to assess and evaluate symptoms and progress in treatment and Medication management   Ms. Rybolt is  a 45 year old female with a history of psychosis and mood instability admitted for worsening of symptoms and bizarre dangerous behavior.   1. Agitation. Seroquel as needed is available.  2. Mood. The patient was restarted on Seroquel for presumed bipolar disorder. She is taking 800 mg of Seroquel nightly.  3. Insomnia. Resolved with Seroquel.  4. Seizures. The patient has been treated with Dilantin. Dilantin level is low at 5.3.   5. Smoking. She refuses a patch and asks for a cigarette.  6. Substance abuse. The patient minimizes her problems and declines treatment.  7. Metabolic syndrome monitoring. Lipid profile, TSH, hemoglobin A1c are normal. Prolactin 50.4.  8. Social. CPS referral was made. CPS worker visited with the patient in the hospital to inform her about eviction and kid's situation. The patient is unconcerend.  9. Trichomonas infection. She completed Metronizadol course.  10. Disposition. To be established. Reportedly the patient and her children are homeless. The children are in the care of relatives. We made referral to [facility].  Kristine Linea, MD 03/18/2016, 12:13 PM

## 2016-03-18 NOTE — Plan of Care (Signed)
Problem: Coping: Goal: Ability to verbalize frustrations and anger appropriately will improve Outcome: Progressing Patient able to verbalize frustrations and use coping skills learned here [place] RN

## 2016-03-18 NOTE — [facility] Group Notes (Signed)
[facility] LCSW Group Therapy  03/18/2016 2:57 PM  Type of Therapy:  Group Therapy  Participation Level:  Active  Participation Quality:  Appropriate  Affect:  Anxious  Cognitive:  Disorganized  Insight:  Lacking  Engagement in Therapy:  Engaged  Modes of Intervention:  Discussion, Education and Support  Summary of Progress/Problems:Feelings around Relapse. Group members discussed the meaning of relapse and shared personal stories of relapse, how it affected them and others, and how they perceived themselves during this time. Group members were encouraged to identify triggers, warning signs and coping skills used when facing the possibility of relapse. Social supports were discussed and explored in detail. Pt was disorganized and had difficulty providing insight related to the topic. Pt needed some redirection for disrupting peers while they were sharing.    Latifa Noble G. Garnette CzechSampson MSW, LCSWA 03/18/2016, 2:57 PM

## 2016-03-18 NOTE — Progress Notes (Signed)
D:  Pt has been guarded and isolative today, irritable during interaction, denies SI/HI/AVH, displays some bizarre behaviors such as only will go through one set of the double doors even if taking that route is a longer way, she will not walk through the double doors that are close to the medication room so she walks all the way around the nurses station to get to the yellow hall where her room is located.     A:  Emotional support provided, Encouraged pt to continue with treatment plan and attend all group activities, q15 min checks maintained for safety.  R:  Pt is minimizing her need to be here, focused on discharge, going to groups,irritable but med compliant and otherwise cooperative with staff and other patients on the unit.

## 2016-03-19 NOTE — [facility] Group Notes (Signed)
[facility] LCSW Group Therapy  03/19/2016 3:33 PM  Type of Therapy:  Group Therapy  Participation Level:  Minimal  Participation Quality:  Intrusive   Affect:  Appropriate  Cognitive:  Alert  Insight:  Limited  Engagement in Therapy:  Limited  Modes of Intervention:  Discussion, Education, Socialization and Support  Summary of Progress/Problems: Feelings around Relapse. Group members discussed the meaning of relapse and shared personal stories of relapse, how it affected them and others, and how they perceived themselves during this time. Group members were encouraged to identify triggers, warning signs and coping skills used when facing the possibility of relapse. Social supports were discussed and explored in detail. Pt attended group and stayed the entire time. She was preoccupied about returning to her house where she believes her children are. She states the friend who they were staying with "wispered things in CPS." She was easily redirectable.    Daisy Floro Lelaina Oatis MSW, LCSWA  03/19/2016, 3:33 PM

## 2016-03-19 NOTE — Progress Notes (Signed)
Karina Anderson cooperative with treatment, she interacted well with staff and peers. She was compliant on shift with medications. Patient did state that she wanted to be discharged. No behavioral issues or abnormal behaviors to report on shift at this time.

## 2016-03-19 NOTE — Progress Notes (Signed)
[facility] MD Progress Note  03/19/2016 5:40 PM Karina Anderson  MRN:  [id number]  Subjective:  Patient is a 45 year old female with history of bipolar disorder admitted in a manic psychotic episode. The patient reportedly lost her house and CPS is looking into taking over the guardianship of her 2 daughters. The patient has no insight into mental illness. She remains delusional and was manic during the interview. Her speech was running it was difficult to understand her. She was talking about her kids and continues to have bizarre behavior. She has been compliant with her medications. She derails very quickly when talking about her children and it becomes difficult to follow her during the interview. She remains focused on her discharge planning as well.   Principal Problem: Bipolar affective disorder, manic, severe, with psychotic behavior (HCC) Diagnosis:   Patient Active Problem List   Diagnosis Date Noted  . Tobacco use disorder [F17.200] 03/13/2016  . Cannabis use disorder, moderate, dependence (HCC) [F12.20] 03/13/2016  . Bipolar affective disorder, manic, severe, with psychotic behavior (HCC) [F31.2] 03/12/2016  . Seizures (HCC) [R56.9] 03/11/2016  . Involuntary commitment [Z04.6] 03/11/2016   Total Time spent with patient: 20 minutes  Past Psychiatric History: Bipolar disorder.  Past Medical History:  Past Medical History  Diagnosis Date  . Seizures (HCC)   . Diabetes mellitus without complication (HCC)    History reviewed. No pertinent past surgical history. Family History: History reviewed. No pertinent family history. Family Psychiatric  History: Unknown. Social History:  History  Alcohol Use No     History  Drug Use No    Social History   Social History  . Marital Status: Single    Spouse Name: N/A  . Number of Children: N/A  . Years of Education: N/A   Social History Main Topics  . Smoking status: Current Every Day Smoker -- 0.50 packs/day    Types: Cigarettes  .  Smokeless tobacco: None  . Alcohol Use: No  . Drug Use: No  . Sexual Activity: Not Asked   Other Topics Concern  . None   Social History Narrative   Additional Social History:                         Sleep: Fair  Appetite:  Fair  Current Medications: Current Facility-Administered Medications  Medication Dose Route Frequency Provider Last Rate Last Dose  . acetaminophen (TYLENOL) tablet 650 mg  650 mg Oral Q6H PRN Audery Amel, MD   650 mg at 03/16/16 2035  . alum & mag hydroxide-simeth (MAALOX/MYLANTA) 200-200-20 MG/5ML suspension 30 mL  30 mL Oral Q4H PRN Audery Amel, MD      . clonazePAM (KLONOPIN) tablet 1 mg  1 mg Oral TID PRN Shari Prows, MD   1 mg at 03/15/16 1451  . feeding supplement (ENSURE ENLIVE) (ENSURE ENLIVE) liquid 237 mL  237 mL Oral BID BM Jolanta B Pucilowska, MD   237 mL at 03/19/16 1327  . haloperidol (HALDOL) tablet 5 mg  5 mg Oral Q6H PRN Shari Prows, MD   5 mg at 03/18/16 2059  . hydrOXYzine (ATARAX/VISTARIL) tablet 50 mg  50 mg Oral Q6H PRN Shari Prows, MD   50 mg at 03/18/16 2059  . magnesium hydroxide (MILK OF MAGNESIA) suspension 30 mL  30 mL Oral Daily PRN Audery Amel, MD      . phenytoin (DILANTIN) ER capsule 100 mg  100 mg Oral TID Jackquline Denmark  Clapacs, MD   100 mg at 03/19/16 1556  . QUEtiapine (SEROQUEL) tablet 200 mg  200 mg Oral TID PRN Jolanta B Pucilowska, MD      . QUEtiapine (SEROQUEL) tablet 800 mg  800 mg Oral QHS Jolanta B Pucilowska, MD   800 mg at 03/18/16 2059    Lab Results: No results found for this or any previous visit (from the past 48 hour(s)).  Blood Alcohol level:  Lab Results  Component Value Date   ETH <5 03/11/2016    Metabolic Disorder Labs: Lab Results  Component Value Date   HGBA1C 4.9 03/12/2016   Lab Results  Component Value Date   PROLACTIN 50.4* 03/12/2016   Lab Results  Component Value Date   CHOL 174 03/12/2016   TRIG 75 03/12/2016   HDL 37* 03/12/2016   CHOLHDL  4.7 03/12/2016   VLDL 15 03/12/2016   LDLCALC 122* 03/12/2016    Physical Findings: AIMS: Facial and Oral Movements Muscles of Facial Expression: None, normal Lips and Perioral Area: None, normal Jaw: None, normal Tongue: None, normal,Extremity Movements Upper (arms, wrists, hands, fingers): None, normal Lower (legs, knees, ankles, toes): None, normal, Trunk Movements Neck, shoulders, hips: None, normal, Overall Severity Severity of abnormal movements (highest score from questions above): None, normal Incapacitation due to abnormal movements: None, normal Patient's awareness of abnormal movements (rate only patient's report): No Awareness, Dental Status Current problems with teeth and/or dentures?: No Does patient usually wear dentures?: No  CIWA:    COWS:     Musculoskeletal: Strength & Muscle Tone: within normal limits Gait & Station: normal Patient leans: N/A  Psychiatric Specialty Exam: Physical Exam  Nursing note and vitals reviewed.   Review of Systems  Psychiatric/Behavioral: Positive for depression and hallucinations.  All other systems reviewed and are negative.   Blood pressure 104/79, pulse 91, temperature 98.3 F (36.8 C), temperature source Oral, resp. rate 18, height 5\' 4"  (1.626 m), weight 124 lb (56.246 kg), last menstrual period 03/11/2016, SpO2 99 %.Body mass index is 21.27 kg/(m^2).  General Appearance: Casual  Eye Contact:  Good  Speech:  Pressured  Volume:  Increased  Mood:  Angry, Dysphoric and Irritable  Affect:  Labile  Thought Process:  Disorganized  Orientation:  Other:  To person and place only.  Thought Content:  Illogical, Delusions and Paranoid Ideation  Suicidal Thoughts:  No  Homicidal Thoughts:  No  Memory:  Immediate;   Poor Recent;   Poor Remote;   Poor  Judgement:  Poor  Insight:  Lacking  Psychomotor Activity:  Increased  Concentration:  Concentration: Poor and Attention Span: Poor  Recall:  Poor  Fund of Knowledge:  Poor   Language:  Fair  Akathisia:  No  Handed:  Right  AIMS (if indicated):     Assets:  Communication Skills Desire for Improvement Financial Resources/Insurance Physical Health Resilience Social Support  ADL's:  Intact  Cognition:  WNL  Sleep:  Number of Hours: 7.45     Treatment Plan Summary: Daily contact with patient to assess and evaluate symptoms and progress in treatment and Medication management   Ms. Harten is a 45 year old female with a history of psychosis and mood instability admitted for worsening of symptoms and bizarre dangerous behavior.   1. Agitation. Seroquel as needed is available.  2. Mood. The patient was restarted on Seroquel for presumed bipolar disorder. She is taking 800 mg of Seroquel nightly.  3. Insomnia. Resolved with Seroquel.  4. Seizures. The patient has been  treated with Dilantin. Dilantin level is low at 5.3.   5. Smoking. She refuses a patch and asks for a cigarette.  6. Substance abuse. The patient minimizes her problems and declines treatment.  7. Metabolic syndrome monitoring. Lipid profile, TSH, hemoglobin A1c are normal. Prolactin 50.4.  8. Social. CPS referral was made. CPS worker visited with the patient in the hospital to inform her about eviction and kid's situation. The patient is unconcerend.  9. Trichomonas infection. She completed Metronizadol course.  10. Disposition. To be established. Reportedly the patient and her children are homeless. The children are in the care of relatives. We made referral to [facility].  Brandy Hale, MD 03/19/2016, 5:40 PM

## 2016-03-19 NOTE — Progress Notes (Signed)
D:  Pt did not fill out the self inventory sheet today, isolates in room, forwards little, irritable affect, going to some groups with minimal participation, minimizing need to be here, med compliant.   A:  Emotional support provided, Encouraged pt to continue with treatment plan and attend all group activities, q15 min checks maintained for safety.  R:  Pt is not receptive, going to some groups.

## 2016-03-19 NOTE — Plan of Care (Signed)
Problem: Coping: Goal: Ability to identify and develop effective coping behavior will improve Outcome: Progressing Patient able to verbalize frustrations and use coping skills at this time [facility] RN

## 2016-03-20 NOTE — Progress Notes (Signed)
Karina Anderson  03/20/2016 7:10 PM Karina Anderson  MRN:  [id number]  Subjective:  Patient is a 45 year old female with history of bipolar disorder admitted in a manic psychotic episode.Pt  was seen for follow-up. She continues to have poor insight into her mental illness. She remains delusional .The patient reportedly lost her house and CPS is looking into taking over the guardianship of her 2 daughters. The patient has no insight into mental illness. She remains delusional and was manic during the interview. Her speech was running it was difficult to understand her. She was talking about her kids and continues to have bizarre behavior. She has been compliant with her medications. She derails very quickly when talking about her children and it becomes difficult to follow her during the interview. She remains focused on her discharge planning as well.   Principal Problem: Bipolar affective disorder, manic, severe, with psychotic behavior (HCC) Diagnosis:   Patient Active Problem List   Diagnosis Date Noted  . Tobacco use disorder [F17.200] 03/13/2016  . Cannabis use disorder, moderate, dependence (HCC) [F12.20] 03/13/2016  . Bipolar affective disorder, manic, severe, with psychotic behavior (HCC) [F31.2] 03/12/2016  . Seizures (HCC) [R56.9] 03/11/2016  . Involuntary commitment [Z04.6] 03/11/2016   Total Time spent with patient: 20 minutes  Past Psychiatric History: Bipolar disorder.  Past Medical History:  Past Medical History:  Diagnosis Date  . Diabetes mellitus without complication (HCC)   . Seizures (HCC)    History reviewed. No pertinent surgical history. Family History: History reviewed. No pertinent family history. Family Psychiatric  History: Unknown. Social History:  History  Alcohol Use No     History  Drug Use No    Social History   Social History  . Marital status: Single    Spouse name: N/A  . Number of children: N/A  . Years of education: N/A   Social  History Main Topics  . Smoking status: Current Every Day Smoker    Packs/day: 0.50    Types: Cigarettes  . Smokeless tobacco: None  . Alcohol use No  . Drug use: No  . Sexual activity: Not Asked   Other Topics Concern  . None   Social History Narrative  . None   Additional Social History:                         Sleep: Fair  Appetite:  Fair  Current Medications: Current Facility-Administered Medications  Medication Dose Route Frequency Provider Last Rate Last Dose  . acetaminophen (TYLENOL) tablet 650 mg  650 mg Oral Q6H PRN Audery Amel, MD   650 mg at 03/16/16 2035  . alum & mag hydroxide-simeth (MAALOX/MYLANTA) 200-200-20 MG/5ML suspension 30 mL  30 mL Oral Q4H PRN Audery Amel, MD      . clonazePAM (KLONOPIN) tablet 1 mg  1 mg Oral TID PRN Shari Prows, MD   1 mg at 03/15/16 1451  . feeding supplement (ENSURE ENLIVE) (ENSURE ENLIVE) liquid 237 mL  237 mL Oral BID BM Jolanta B Pucilowska, MD   237 mL at 03/20/16 1400  . haloperidol (HALDOL) tablet 5 mg  5 mg Oral Q6H PRN Shari Prows, MD   5 mg at 03/18/16 2059  . hydrOXYzine (ATARAX/VISTARIL) tablet 50 mg  50 mg Oral Q6H PRN Shari Prows, MD   50 mg at 03/18/16 2059  . magnesium hydroxide (MILK OF MAGNESIA) suspension 30 mL  30 mL Oral Daily PRN Jonny Ruiz  T Clapacs, MD      . phenytoin (DILANTIN) ER capsule 100 mg  100 mg Oral TID Audery Amel, MD   100 mg at 03/20/16 1601  . QUEtiapine (SEROQUEL) tablet 200 mg  200 mg Oral TID PRN Jolanta B Pucilowska, MD      . QUEtiapine (SEROQUEL) tablet 800 mg  800 mg Oral QHS Jolanta B Pucilowska, MD   800 mg at 03/19/16 2137    Lab Results: No results found for this or any previous visit (from the past 48 hour(s)).  Blood Alcohol level:  Lab Results  Component Value Date   ETH <5 03/11/2016    Metabolic Disorder Labs: Lab Results  Component Value Date   HGBA1C 4.9 03/12/2016   Lab Results  Component Value Date   PROLACTIN 50.4 (H)  03/12/2016   Lab Results  Component Value Date   CHOL 174 03/12/2016   TRIG 75 03/12/2016   HDL 37 (L) 03/12/2016   CHOLHDL 4.7 03/12/2016   VLDL 15 03/12/2016   LDLCALC 122 (H) 03/12/2016    Physical Findings: AIMS: Facial and Oral Movements Muscles of Facial Expression: None, normal Lips and Perioral Area: None, normal Jaw: None, normal Tongue: None, normal,Extremity Movements Upper (arms, wrists, hands, fingers): None, normal Lower (legs, knees, ankles, toes): None, normal, Trunk Movements Neck, shoulders, hips: None, normal, Overall Severity Severity of abnormal movements (highest score from questions above): None, normal Incapacitation due to abnormal movements: None, normal Patient's awareness of abnormal movements (rate only patient's report): No Awareness, Dental Status Current problems with teeth and/or dentures?: No Does patient usually wear dentures?: No  CIWA:    COWS:     Musculoskeletal: Strength & Muscle Tone: within normal limits Gait & Station: normal Patient leans: N/A  Psychiatric Specialty Exam: Physical Exam  Nursing Anderson and vitals reviewed.   Review of Systems  Psychiatric/Behavioral: Positive for depression and hallucinations.  All other systems reviewed and are negative.   Blood pressure 106/78, pulse 96, temperature 97.8 F (36.6 C), temperature source Oral, resp. rate 18, height 5\' 4"  (1.626 m), weight 124 lb (56.2 kg), last menstrual period 03/11/2016, SpO2 99 %.Body mass index is 21.28 kg/m.  General Appearance: Casual  Eye Contact:  Good  Speech:  Pressured  Volume:  Increased  Mood:  Angry, Dysphoric and Irritable  Affect:  Labile  Thought Process:  Disorganized  Orientation:  Other:  To person and place only.  Thought Content:  Illogical, Delusions and Paranoid Ideation  Suicidal Thoughts:  No  Homicidal Thoughts:  No  Memory:  Immediate;   Poor Recent;   Poor Remote;   Poor  Judgement:  Poor  Insight:  Lacking  Psychomotor  Activity:  Increased  Concentration:  Concentration: Poor and Attention Span: Poor  Recall:  Poor  Fund of Knowledge:  Poor  Language:  Fair  Akathisia:  No  Handed:  Right  AIMS (if indicated):     Assets:  Communication Skills Desire for Improvement Financial Resources/Insurance Physical Health Resilience Social Support  ADL's:  Intact  Cognition:  WNL  Sleep:  Number of Hours: 6.75     Treatment Plan Summary: Daily contact with patient to assess and evaluate symptoms and progress in treatment and Medication management   Ms. Kirby is a 45 year old female with a history of psychosis and mood instability admitted for worsening of symptoms and bizarre dangerous behavior.   1. Agitation. Seroquel as needed is available.  2. Mood. The patient was restarted on Seroquel  for presumed bipolar disorder. She is taking 800 mg of Seroquel nightly.  3. Insomnia. Resolved with Seroquel.  4. Seizures. The patient has been treated with Dilantin. Dilantin level is low at 5.3.   5. Smoking. She refuses a patch and asks for a cigarette.  6. Substance abuse. The patient minimizes her problems and declines treatment.  7. Metabolic syndrome monitoring. Lipid profile, TSH, hemoglobin A1c are normal. Prolactin 50.4.  8. Social. CPS referral was made. CPS worker visited with the patient in the hospital to inform her about eviction and kid's situation. The patient is unconcerend.  9. Trichomonas infection. She completed Metronizadol course.  10. Disposition. To be established. Reportedly the patient and her children are homeless. The children are in the care of relatives. We made referral to [facility].  Brandy Hale, MD 03/20/2016, 7:10 PM

## 2016-03-20 NOTE — Plan of Care (Signed)
Problem: Education: Goal: Emotional status will improve Outcome: Progressing Pt pleasant and cooperative. No irritability noted.

## 2016-03-20 NOTE — [facility] Group Notes (Signed)
[facility] LCSW Group Therapy  03/20/2016 4:03 PM  Type of Therapy:  Group Therapy  Participation Level:  Active  Participation Quality:  Attentive and Sharing  Affect:  Labile and Tearful  Cognitive:  Disorganized  Insight:  Limited  Engagement in Therapy:  Engaged  Modes of Intervention:  Discussion, Education and Support  Summary of Progress/Problems: Patients defined and discussed healthy coping skills. Patients identified healthy coping skills they would like to try during hospitalization and after discharge. CSW offered insight to varying coping skills that may have been new to patients such as practicing mindfulness. Pt was sharing during group and engaged in the discussion. Pt became disorganized in her thinking after group and became extremely angry with both CSW's in the room concerning her discharge, returning to her home (although pt is evicted), and seeing her children. CSW explained to pt that CSW's do not authorize/confirm discharge dates and that she would need to discuss her specific discharge date with her doctor. Pt threaten CSW and starting yelling stating she wanted to see her doctor immediately or something bad was going to happen. CSW attempted to redirect conversation and provide validation to pt about her feelings. Pt became more angry when security stood by the door. Pt stated she had a court date on the 27th and that her brother is the one who needs help not her and that her being involuntary is illegal. Pt was unwilling to listen to CSW and left the room during the conversation and was heard loudly crying in her room.   Aliannah Holstrom G. Garnette Czech MSW, LCSWA 03/20/2016, 4:17 PM

## 2016-03-20 NOTE — Progress Notes (Signed)
Patient ID: Oretha Ellis, female   DOB: 05/13/1971, 45 y.o.   MRN: [id number]  Pt became disorganized in her thinking after group and became extremely angry with both CSW's in the room concerning her discharge, returning to her home (although pt is evicted), and seeing her children. CSW explained to pt that CSW's do not authorize/confirm discharge dates and that she would need to discuss her specific discharge date with her doctor. Pt threaten CSW and starting yelling stating she wanted to see her doctor immediately or something bad was going to happen. CSW attempted to redirect conversation and provide validation to pt about her feelings. Pt became more angry when security stood by the door. Pt stated she had a court date on the 27th and that her brother is the one who needs help not her and that her being involuntary is illegal. Pt was unwilling to listen to CSW and left the room during the conversation and was heard loudly crying in her room.   Luie Laneve G. Garnette Czech MSW, [facility] 03/20/2016 4:19 PM

## 2016-03-20 NOTE — Progress Notes (Signed)
D: Pt was pleasant this morning during med pass. No irritability noted. Reports wants to go home to get her children. Per SW, pt reports that  Dr told her to see SW about discharge plans. Pt later came looking for phone book to look up lawyers. A: Encouragement and support offered. Encouraged patient to attend group and verbalize feelings. q 15 min checks maintained for safety. R: Pt receptive and remains safe on unit with q 15 min checks.

## 2016-03-20 NOTE — [facility] Group Notes (Signed)
[facility] Group Notes:  (Nursing/MHT/Case Management/Adjunct)  Date:  03/20/2016  Time:  11:53 PM  Type of Therapy:  Group Therapy  Participation Level:  Active  Participation Quality:  Appropriate  Affect:  Appropriate  Cognitive:  Appropriate  Insight:  Appropriate  Engagement in Group:  Engaged  Modes of Intervention:  Discussion  Summary of Progress/Problems: Pt is very concerned about her issues with custody of her children. Pt stated that she needs a lawyer and will be talking to her social worker about this issue.   Veva Holes 03/20/2016, 11:53 PM

## 2016-03-21 NOTE — [facility] Group Notes (Signed)
[facility] Group Notes:  (Nursing/MHT/Case Management/Adjunct)  Date:  03/21/2016  Time:  11:27 PM  Type of Therapy:  Evening Wrap-up Group  Participation Level:  Active  Participation Quality:  Appropriate and Attentive  Affect:  Appropriate  Cognitive:  Alert and Appropriate  Insight:  Appropriate, Good and Improving  Engagement in Group:  Developing/Improving and Engaged  Modes of Intervention:  Activity  Summary of Progress/Problems: Patient very pleasant and engaged in outside activity.  Nikitas Davtyan Nanta Claritza July 03/21/2016, 11:27 PM

## 2016-03-21 NOTE — Progress Notes (Signed)
[facility] MD Progress Note  03/21/2016 10:24 AM [place]  MRN:  [id number]  Subjective:  Ms. Lins denies any symptoms of depression, anxiety, or psychosis and demands to be discharged. Her children are now safe with a family member. They visited this weekend and apparently the visit went well. The patient is able to hold a superficial conversation but has not been able as of yet to participate in discharge planning. She insists that she will move back to the house that we know she had been evicted from. She gets agitated and threatening when her plan is challenged. She disclosed that she has a court date on the 27th which we were unaware of. We will check with child protective services social worker. Today for the first time she did agree to follow-up with mental health provider N met with Sherrian Divers, [facility] representative. She accepts medications and tolerates it well. She participates in programming and in her own view she is doing everything that is required of her to be discharged. She believes that we keep her in the hospital illegally  Principal Problem: Bipolar affective disorder, manic, severe, with psychotic behavior ([place]) Diagnosis:   Patient Active Problem List   Diagnosis Date Noted  . Tobacco use disorder [F17.200] 03/13/2016  . Cannabis use disorder, moderate, dependence ([place]) [F12.20] 03/13/2016  . Bipolar affective disorder, manic, severe, with psychotic behavior ([place]) [F31.2] 03/12/2016  . Seizures ([place]) [R56.9] 03/11/2016  . Involuntary commitment [Z04.6] 03/11/2016   Total Time spent with patient: 20 minutes  Past Psychiatric History: Bipolar disorder.  Past Medical History:  Past Medical History:  Diagnosis Date  . Diabetes mellitus without complication ([place])   . Seizures ([place])    History reviewed. No pertinent surgical history. Family History: History reviewed. No pertinent family history. Family Psychiatric  History: Unknown. Social History:  History  Alcohol Use  No     History  Drug Use No    Social History   Social History  . Marital status: Single    Spouse name: N/A  . Number of children: N/A  . Years of education: N/A   Social History Main Topics  . Smoking status: Current Every Day Smoker    Packs/day: 0.50    Types: Cigarettes  . Smokeless tobacco: None  . Alcohol use No  . Drug use: No  . Sexual activity: Not Asked   Other Topics Concern  . None   Social History Narrative  . None   Additional Social History:                         Sleep: Fair  Appetite:  Fair  Current Medications: Current Facility-Administered Medications  Medication Dose Route Frequency Provider Last Rate Last Dose  . acetaminophen (TYLENOL) tablet 650 mg  650 mg Oral Q6H PRN Gonzella Lex, MD   650 mg at 03/16/16 2035  . alum & mag hydroxide-simeth (MAALOX/MYLANTA) 200-200-20 MG/5ML suspension 30 mL  30 mL Oral Q4H PRN Gonzella Lex, MD      . clonazePAM (KLONOPIN) tablet 1 mg  1 mg Oral TID PRN Clovis Fredrickson, MD   1 mg at 03/15/16 1451  . feeding supplement (ENSURE ENLIVE) (ENSURE ENLIVE) liquid 237 mL  237 mL Oral BID BM Kynzi Levay B Dalilah Curlin, MD   237 mL at 03/21/16 0819  . haloperidol (HALDOL) tablet 5 mg  5 mg Oral Q6H PRN Clovis Fredrickson, MD   5 mg at 03/18/16 2059  .  hydrOXYzine (ATARAX/VISTARIL) tablet 50 mg  50 mg Oral Q6H PRN Clovis Fredrickson, MD   50 mg at 03/18/16 2059  . magnesium hydroxide (MILK OF MAGNESIA) suspension 30 mL  30 mL Oral Daily PRN Gonzella Lex, MD      . phenytoin (DILANTIN) ER capsule 100 mg  100 mg Oral TID Gonzella Lex, MD   100 mg at 03/21/16 0817  . QUEtiapine (SEROQUEL) tablet 200 mg  200 mg Oral TID PRN Alem Fahl B Antoinne Spadaccini, MD      . QUEtiapine (SEROQUEL) tablet 800 mg  800 mg Oral QHS Debrina Kizer B Carmine Carrozza, MD   800 mg at 03/20/16 2149    Lab Results: No results found for this or any previous visit (from the past 15 hour(s)).  Blood Alcohol level:  Lab Results  Component Value  Date   ETH <5 67/61/9509    Metabolic Disorder Labs: Lab Results  Component Value Date   HGBA1C 4.9 03/12/2016   Lab Results  Component Value Date   PROLACTIN 50.4 (H) 03/12/2016   Lab Results  Component Value Date   CHOL 174 03/12/2016   TRIG 75 03/12/2016   HDL 37 (L) 03/12/2016   CHOLHDL 4.7 03/12/2016   VLDL 15 03/12/2016   LDLCALC 122 (H) 03/12/2016    Physical Findings: AIMS: Facial and Oral Movements Muscles of Facial Expression: None, normal Lips and Perioral Area: None, normal Jaw: None, normal Tongue: None, normal,Extremity Movements Upper (arms, wrists, hands, fingers): None, normal Lower (legs, knees, ankles, toes): None, normal, Trunk Movements Neck, shoulders, hips: None, normal, Overall Severity Severity of abnormal movements (highest score from questions above): None, normal Incapacitation due to abnormal movements: None, normal Patient's awareness of abnormal movements (rate only patient's report): No Awareness, Dental Status Current problems with teeth and/or dentures?: No Does patient usually wear dentures?: No  CIWA:    COWS:     Musculoskeletal: Strength & Muscle Tone: within normal limits Gait & Station: normal Patient leans: N/A  Psychiatric Specialty Exam: Physical Exam  Nursing note and vitals reviewed.   Review of Systems  Psychiatric/Behavioral: Positive for hallucinations.  All other systems reviewed and are negative.   Blood pressure 106/81, pulse 98, temperature 98.1 F (36.7 C), temperature source Oral, resp. rate 18, height _0  (1.626 m), weight 56.2 kg (124 lb), last menstrual period 03/11/2016, SpO2 99 %.Body mass index is 21.28 kg/m.  General Appearance: Casual  Eye Contact:  Good  Speech:  Clear and Coherent  Volume:  Increased  Mood:  Angry, Dysphoric and Irritable  Affect:  Inappropriate and Labile  Thought Process:  Disorganized  Orientation:  Full (Time, Place, and Person)  Thought Content:  Delusions and  Paranoid Ideation  Suicidal Thoughts:  No  Homicidal Thoughts:  No  Memory:  Immediate;   Fair Recent;   Fair Remote;   Fair  Judgement:  Poor  Insight:  Lacking  Psychomotor Activity:  Normal  Concentration:  Concentration: Fair and Attention Span: Fair  Recall:  [employer] of Knowledge:  Fair  Language:  Fair  Akathisia:  No  Handed:  Right  AIMS (if indicated):     Assets:  Communication Skills Desire for Improvement Financial Resources/Insurance Physical Health Resilience Social Support  ADL's:  Intact  Cognition:  WNL  Sleep:  Number of Hours: 7.15     Treatment Plan Summary: Daily contact with patient to assess and evaluate symptoms and progress in treatment and Medication management   Ms. Falkner is  a 45 year old female with a history of psychosis and mood instability admitted for worsening of symptoms and bizarre dangerous behavior.   1. Agitation. Seroquel as needed is available.  2. Mood. The patient was restarted on Seroquel for mood stabilization and psychosis.   3. Insomnia. Resolved with Seroquel.  4. Seizures. The patient has been treated with Dilantin. Dilantin level is low at 5.3.   5. Smoking. She refuses a patch and asks for a cigarette.  6. Substance abuse. The patient minimizes her problems and declines treatment.  7. Metabolic syndrome monitoring. Lipid profile, TSH, hemoglobin A1c are normal. Prolactin 50.4.  8. Social. CPS referral was made. CPS worker visited with the patient in the hospital to inform her about eviction and kid's situation. The patient is unconcerend.  9. Trichomonas infection. She completed Metronizadol course.  10. Disposition. To be established. Reportedly the patient and her children are homeless. The children are in the care of relatives. We made referral to [facility].  Orson Slick, MD 03/21/2016, 10:24 AM

## 2016-03-21 NOTE — Progress Notes (Signed)
D: Pt denies SI/HI/AVH. Pt is pleasant and cooperative. Pt stated she feels better after visitation with her children. Patient appears less anxious, thoughts are less disorganized, no bizarre behavior noted. Patient  is interacting with peers and staff appropriately.  A: Pt was offered support and encouragement. Pt was given scheduled medications. Pt was encouraged to attend groups. Q 15 minute checks were done for safety.  R:Pt attends groups and interacts well with peers and staff. Pt is taking medication. Pt has no complaints.Pt receptive to treatment and safety maintained on unit.

## 2016-03-21 NOTE — Progress Notes (Signed)
D:  Denies SI/HI/AVH, pleasant during interaction, guarded and minimal interaction, brightens on approach     A:  Emotional support provided, Encouraged pt to continue with treatment plan and attend all group activities, q15 min checks maintained for safety.  R:  Pt is going to groups, pleasant and cooperative with staff and other patients on the unit.

## 2016-03-21 NOTE — Plan of Care (Signed)
Problem: Coping: Goal: Ability to verbalize frustrations and anger appropriately will improve Outcome: Progressing Patient verbalize anger to staff.

## 2016-03-21 NOTE — [facility] Group Notes (Signed)
[facility] LCSW Group Therapy   03/21/2016 9:30am Type of Therapy: Group Therapy   Participation Level: Active   Participation Quality: Attentive, Sharing and Supportive   Affect: Depressed and Flat   Cognitive: Alert and Oriented   Insight: Developing/Improving and Engaged   Engagement in Therapy: Developing/Improving and Engaged   Modes of Intervention: Clarification, Confrontation, Discussion, Education, Exploration,  Limit-setting, Orientation, Problem-solving, Rapport Building, Dance movement psychotherapist, Socialization and Support   Summary of Progress/Problems: Pt identified obstacles faced currently and processed barriers involved in overcoming these obstacles. Pt identified steps necessary for overcoming these obstacles and explored motivation (internal and external) for facing these difficulties head on. Pt further identified one area of concern in their lives and chose a goal to focus on for today. Pt identified unregulated emotions as an obstacle. She stated that not many people take the time out to learn her for who she really is and sometimes causes her to look like the "bad" person. She believes that building a positive relationship with her children and getting her responsibilities together will help her overcome obstacles she faces on a daily basis.  Hampton Abbot, LCSWA

## 2016-03-22 NOTE — Plan of Care (Signed)
Problem: Coping: Goal: Ability to identify and develop effective coping behavior will improve Outcome: Progressing Patient cooperative with plan of care. Therapy groups encouraged to learn and initiate coping skills.

## 2016-03-22 NOTE — Tx Team (Signed)
Interdisciplinary Treatment Plan Update (Adult)  Date:  03/22/2016 Time Reviewed:  2:26 PM Progress in Treatment: Attending groups: Yes. Participating in groups:  Yes. Taking medication as prescribed:  No. Tolerating medication:    Family/Significant othe contact made:  Yes, individual(s) contacted:  Jearld Adjutant Patient understands diagnosis:  No. Discussing patient identified problems/goals with staff:  Yes. Medical problems stabilized or resolved:  Yes. Denies suicidal/homicidal ideation: No. Issues/concerns per patient self-inventory:  No. Other:  New problem(s) identified: N/A  Discharge Plan or Barriers: Pt insight into discharge planning is improving some. She has agreed to follow up with [facility] in [place].  Her living situation is unclear at this time.  She will also be working with CPS in regards to the wellbeing of her children.  Reason for Continuation of Hospitalization: Aggression Anxiety Delusions  Depression Mania Medication stabilization  Comments: N/A  Estimated length of stay: 3-5 days  New goal(s): N/A  Review of initial/current patient goals per problem list:   1.? Goal(s): Patient will participate in aftercare plan ? Met:? No ? Target date: at discharge  ? As evidenced by: Patient will participate within aftercare plan AEB aftercare provider and housing plan at discharge being identified.  ? 2.? Goal (s): Patient will exhibit decreased depressive symptoms and suicidal ideations. ? Met:? No ? ?Target date: at discharge  ? As evidenced by: Patient will utilize self rating of depression at 3 or below and demonstrate decreased signs of depression or be deemed stable for discharge by MD.  ? 3.? Goal(s): Patient will demonstrate decreased signs and symptoms of anxiety. ? Met:? No ? Target date: at discharge  ? As evidenced by: Patient will utilize self rating of anxiety at 3 or below and demonstrated decreased signs of anxiety, or be deemed  stable for discharge by MD   5.? Goal (s): Patient will demonstrate decreased symptoms of Mania. ? Met:?No  ? ?Target date: at discharge  ? As evidenced by: Patient will not endorse signs of mania or be deemed stable for discharge by MD.     Attendees: Patient:  Karina Anderson 7/25/20172:26 PM  Family:   7/25/20172:26 PM  Physician:  Orson Slick 7/25/20172:26 PM  Nursing:   Polly Cobia, RN 7/25/20172:26 PM  Case Manager:   7/25/20172:26 PM  Counselor:  Dossie Arbour, LCSW 7/25/20172:26 PM  Other:   7/25/20172:26 PM  Other:   7/25/20172:26 PM  Other:   7/25/20172:26 PM  Other:  7/25/20172:26 PM  Other:  7/25/20172:26 PM  Other:  7/25/20172:26 PM  Other:  7/25/20172:26 PM  Other:  7/25/20172:26 PM  Other:  7/25/20172:26 PM  Other:   7/25/20172:26 PM   Scribe for Treatment Team:   August Saucer, 03/22/2016, 2:26 PM, MSW, LCSW

## 2016-03-22 NOTE — Progress Notes (Signed)
Patient ID: Karina Anderson, female   DOB: 04/21/1971, 45 y.o.   MRN: [id number] Talked with Pt's godmother- Karina Anderson, [phone] she doesn't want Pt to live with her at this time.  CPS Social worker WARD said that she would have to be connected to outpatient Mental health services before she could be in same house with kids anyway.  She needs to find another place to live.  She has agreed to follow up with Lorella Nimrod at [facility] where she would likely qualify for [employer].

## 2016-03-22 NOTE — Plan of Care (Signed)
Problem: Coping: Goal: Ability to demonstrate self-control will improve Outcome: Progressing Patient able  To  demonstrate self control at this time on unit [place]

## 2016-03-22 NOTE — Progress Notes (Signed)
Patient with appropriate affect, cooperative behavior with meals, meds and plan of care. No SI/HI/AVH at this time. Patient states she is interested in working on her discharge plan of care. MD into assess and to discuss ideas patient may have rt discharge plan of care. Patient is interested in speaking with Lorella Nimrod rt discharge. Patient with few resources and limited support people rt discharge. Therapy groups encouraged to learn and initiate coping skills. Safety maintained.

## 2016-03-22 NOTE — Progress Notes (Addendum)
[facility] MD Progress Note  03/22/2016 1:58 PM [place]  MRN:  [id number]  Subjective:  Ms. Maenza met with the treatment team today. She denies any symptoms of depression, anxiety, or psychosis. She is compliant with medications in programming on the unit. She is however still unable to participate in discharge planning. She is still delusional and insists that she will go back to her apartment for which she takes $700. According to information provided by the police and child protective services, the patient was evicted from the apartment. We are concerned that if discharged too soon and unreasonable, the patient will loose custody of her children to DSS.  Principal Problem: Bipolar affective disorder, manic, severe, with psychotic behavior ([place]) Diagnosis:   Patient Active Problem List   Diagnosis Date Noted  . Tobacco use disorder [F17.200] 03/13/2016  . Cannabis use disorder, moderate, dependence ([place]) [F12.20] 03/13/2016  . Bipolar affective disorder, manic, severe, with psychotic behavior ([place]) [F31.2] 03/12/2016  . Seizures ([place]) [R56.9] 03/11/2016  . Involuntary commitment [Z04.6] 03/11/2016   Total Time spent with patient: 20 minutes  Past Psychiatric History: Bipolar disorder.  Past Medical History:  Past Medical History:  Diagnosis Date  . Diabetes mellitus without complication ([place])   . Seizures ([place])    History reviewed. No pertinent surgical history. Family History: History reviewed. No pertinent family history. Family Psychiatric  History: See H&P. Social History:  History  Alcohol Use No     History  Drug Use No    Social History   Social History  . Marital status: Single    Spouse name: N/A  . Number of children: N/A  . Years of education: N/A   Social History Main Topics  . Smoking status: Current Every Day Smoker    Packs/day: 0.50    Types: Cigarettes  . Smokeless tobacco: None  . Alcohol use No  . Drug use: No  . Sexual activity: Not Asked    Other Topics Concern  . None   Social History Narrative  . None   Additional Social History:                         Sleep: Fair  Appetite:  Fair  Current Medications: Current Facility-Administered Medications  Medication Dose Route Frequency Provider Last Rate Last Dose  . acetaminophen (TYLENOL) tablet 650 mg  650 mg Oral Q6H PRN Gonzella Lex, MD   650 mg at 03/16/16 2035  . alum & mag hydroxide-simeth (MAALOX/MYLANTA) 200-200-20 MG/5ML suspension 30 mL  30 mL Oral Q4H PRN Gonzella Lex, MD      . clonazePAM (KLONOPIN) tablet 1 mg  1 mg Oral TID PRN Clovis Fredrickson, MD   1 mg at 03/15/16 1451  . feeding supplement (ENSURE ENLIVE) (ENSURE ENLIVE) liquid 237 mL  237 mL Oral BID BM Jolanta B Pucilowska, MD   237 mL at 03/22/16 1029  . haloperidol (HALDOL) tablet 5 mg  5 mg Oral Q6H PRN Clovis Fredrickson, MD   5 mg at 03/18/16 2059  . hydrOXYzine (ATARAX/VISTARIL) tablet 50 mg  50 mg Oral Q6H PRN Clovis Fredrickson, MD   50 mg at 03/18/16 2059  . magnesium hydroxide (MILK OF MAGNESIA) suspension 30 mL  30 mL Oral Daily PRN Gonzella Lex, MD      . phenytoin (DILANTIN) ER capsule 100 mg  100 mg Oral TID Gonzella Lex, MD   100 mg at 03/22/16 0841  .  QUEtiapine (SEROQUEL) tablet 200 mg  200 mg Oral TID PRN Jolanta B Pucilowska, MD      . QUEtiapine (SEROQUEL) tablet 800 mg  800 mg Oral QHS Jolanta B Pucilowska, MD   800 mg at 03/21/16 2204    Lab Results: No results found for this or any previous visit (from the past 2 hour(s)).  Blood Alcohol level:  Lab Results  Component Value Date   ETH <5 19/62/2297    Metabolic Disorder Labs: Lab Results  Component Value Date   HGBA1C 4.9 03/12/2016   Lab Results  Component Value Date   PROLACTIN 50.4 (H) 03/12/2016   Lab Results  Component Value Date   CHOL 174 03/12/2016   TRIG 75 03/12/2016   HDL 37 (L) 03/12/2016   CHOLHDL 4.7 03/12/2016   VLDL 15 03/12/2016   LDLCALC 122 (H) 03/12/2016     Physical Findings: AIMS: Facial and Oral Movements Muscles of Facial Expression: None, normal Lips and Perioral Area: None, normal Jaw: None, normal Tongue: None, normal,Extremity Movements Upper (arms, wrists, hands, fingers): None, normal Lower (legs, knees, ankles, toes): None, normal, Trunk Movements Neck, shoulders, hips: None, normal, Overall Severity Severity of abnormal movements (highest score from questions above): None, normal Incapacitation due to abnormal movements: None, normal Patient's awareness of abnormal movements (rate only patient's report): No Awareness, Dental Status Current problems with teeth and/or dentures?: No Does patient usually wear dentures?: No  CIWA:    COWS:     Musculoskeletal: Strength & Muscle Tone: within normal limits Gait & Station: normal Patient leans: N/A  Psychiatric Specialty Exam: Physical Exam  Nursing note and vitals reviewed.   Review of Systems  Psychiatric/Behavioral: Positive for hallucinations.  All other systems reviewed and are negative.   Blood pressure 110/79, pulse 87, temperature 98 F (36.7 C), temperature source Oral, resp. rate 18, height '5\' 4"'$  (1.626 m), weight 56.2 kg (124 lb), last menstrual period 03/11/2016, SpO2 99 %.Body mass index is 21.28 kg/m.  General Appearance: Casual  Eye Contact:  Good  Speech:  Clear and Coherent  Volume:  Normal  Mood:  Anxious  Affect:  Appropriate  Thought Process:  Disorganized  Orientation:  Full (Time, Place, and Person)  Thought Content:  Delusions and Paranoid Ideation  Suicidal Thoughts:  No  Homicidal Thoughts:  No  Memory:  Immediate;   Fair Recent;   Fair Remote;   Fair  Judgement:  Poor  Insight:  Lacking  Psychomotor Activity:  Normal  Concentration:  Concentration: Fair and Attention Span: Fair  Recall:  [employer] of Knowledge:  Fair  Language:  Fair  Akathisia:  No  Handed:  Right  AIMS (if indicated):     Assets:  Communication Skills Desire  for Improvement Financial Resources/Insurance Physical Health Resilience Social Support  ADL's:  Intact  Cognition:  WNL  Sleep:  Number of Hours: 7.15     Treatment Plan Summary: Daily contact with patient to assess and evaluate symptoms and progress in treatment and Medication management   Ms. Balles is a 45 year old female with a history of psychosis and mood instability admitted for worsening of symptoms and bizarre, dangerous behavior.   1. Agitation. Seroquel as needed is available.  2. Mood. The patient was restarted on Seroquel for mood stabilization and psychosis.   3. Insomnia. Resolved with Seroquel.  4. Seizures. The patient has been treated with Dilantin. Dilantin level is low at 5.3.   5. Smoking. She refuses a patch and asks for  a cigarette.  6. Substance abuse. The patient minimizes her problems and declines treatment.  7. Metabolic syndrome monitoring. Lipid profile, TSH, hemoglobin A1c are normal. Prolactin 50.4.  8. Social. CPS referral was made. CPS worker visited with the patient in the hospital to inform her about eviction and kid's situation. The patient still does not appreciate the severity of her situation.  9. Trichomonas infection. She completed Metronizadol course.  10. Disposition. To be established. Reportedly the patient and her children are homeless. The children are in the care of relatives with CPS involvement. [facility].  Orson Slick, MD 03/22/2016, 2:36 PM

## 2016-03-22 NOTE — Progress Notes (Signed)
D: Patient is alert and oriented on the unit this shift. Patient attended and   participated in groups today. Patient denies suicidal ideation, homicidal ideation, auditory or visual hallucinations at the present time.  A: Scheduled medications are administered to patient as per MD orders. Emotional support and encouragement are provided. Patient is maintained on q.15 minute safety checks. Patient is informed to notify staff with questions or concerns. R: No adverse medication reactions are noted. Patient is cooperative with medication administration and treatment plan today. Patient is  calm and cooperative on the unit at this time. Patient interacts  with others on the unit this shift. Patient contracts for safety at this time. Patient remains safe at this time. ANXIETY 3/10 DEPRESSION 4/10

## 2016-03-23 NOTE — Progress Notes (Addendum)
Patient ID: Karina Anderson, female   DOB: 12/11/1970, 45 y.o.   MRN: [id number]  Child Protective Service (CPS) worker Kira Ward met with pt to discuss updates on her active case with CPS. Mrs. Ward informed pt that there was two options as of now that CPS will explore for placement for her and her kids. The residence  that the children currently are staying ([name] (Godmother)) will not allow mother to stay in the home. Pt expressed understanding of this.    G.  MSW, LCSWA 03/23/2016 3:47 PM    CSW met with pt to discuss discharge options and if pt would like follow-up care for mental health. Pt became argumentative with CSW as evidenced by yelling, making threatening statements "If I am still in here on my dead mother's birthday, everyone is going to get it!". CSW attempted to redirect pt however pt became tearful and stated "I am going to sue everyone in this place". CSW told pt she would speak with her again at another time when pt was more calm. Pt walked out of the room. CSW will continue to work with  to determine safe discharge plan for pt.     G.  MSW, LCSWA 03/23/2016 3:59 PM 

## 2016-03-23 NOTE — [facility] Group Notes (Signed)
[facility] Group Notes:  (Nursing/MHT/Case Management/Adjunct)  Date:  03/23/2016  Time:  6:58 AM  Type of Therapy:  Group Therapy  Participation Level:  Active  Participation Quality:  Appropriate  Affect:  Appropriate  Cognitive:  Appropriate  Insight:  Appropriate  Engagement in Group:  Engaged  Modes of Intervention:  Activity  Summary of Progress/Problems:  Karina Anderson 03/23/2016, 6:58 AM

## 2016-03-23 NOTE — [facility] Group Notes (Signed)
[facility] LCSW Group Therapy   03/23/2016  9:30 am   Type of Therapy: Group Therapy   Participation Level: Active   Participation Quality: Attentive, Sharing and Supportive   Affect: Appropriate  Cognitive: Alert and Oriented   Insight: Developing/Improving and Engaged   Engagement in Therapy: Developing/Improving and Engaged   Modes of Intervention: Clarification, Confrontation, Discussion, Education, Exploration, Limit-setting, Orientation, Problem-solving, Rapport Building, Dance movement psychotherapist, Socialization and Support   Summary of Progress/Problems: The topic for group today was emotional regulation. This group focused on both positive and negative emotion identification and allowed  group members to process ways to identify feelings, regulate negative emotions, and find healthy ways to manage internal/external emotions. Group members were asked to reflect on a time when their reaction to an emotion led to a negative outcome and explored how alternative responses using emotion regulation would have benefited them. Group members were also asked to discuss a time when emotion regulation was utilized when a negative emotion was experienced. Pt reported a healthy coping mechanism the pt uses to cope in a crisis is dancing to music that the pt can enjoy the words to at the time of the pt's crisis.  The pt reported that using the pt's primary coping mechanism assists the pt in getting her "emotions out".  Pt reported that the pt's primary goal is successfully begin a "spiritual journey as an evangelical".  Pt seems to have made great improvement during group, as evidenced by sharing properly, sharing at length only when prompted to and pt presents as happy and increasingly energetic, as compared to previous sessions. Pt was polite and cooperative with the CSW and other group members and focused and attentive to the topics discussed and the sharing of others.       Dorothe Pea. Annaelle Kasel, MSW, LCSWA,  LCAS

## 2016-03-23 NOTE — Progress Notes (Addendum)
[facility] MD Progress Note  03/23/2016 2:24 PM [place]  MRN:  [id number]  Subjective:  Ms. Dromgoole is rather upset today. She met with her CPS social worker will inform her that she may not go back to her apartment or Ms. Jimmye Norman currently keeps the children. No satisfactory discharge plan has been arranged for. The patient still insists on going back to her apartment that she was evicted from an is no longer available. She accepts medications and tolerates them well. She is able to hold a brief conversation but when asked about details of her discharge plan she loses her composure and becomes disorganized.  Principal Problem: Bipolar affective disorder, manic, severe, with psychotic behavior ([place]) Diagnosis:   Patient Active Problem List   Diagnosis Date Noted  . Cannabis use disorder, moderate, dependence ([place]) [F12.20] 03/13/2016    Priority: Medium  . Involuntary commitment [Z04.6] 03/11/2016    Priority: Medium  . Tobacco use disorder [F17.200] 03/13/2016  . Bipolar affective disorder, manic, severe, with psychotic behavior ([place]) [F31.2] 03/12/2016  . Seizures ([place]) [R56.9] 03/11/2016   Total Time spent with patient: 20 minutes  Past Psychiatric History: Bipolar disorder.  Past Medical History:  Past Medical History:  Diagnosis Date  . Diabetes mellitus without complication ([place])   . Seizures ([place])    History reviewed. No pertinent surgical history. Family History: History reviewed. No pertinent family history. Family Psychiatric  History: See H&P. Social History:  History  Alcohol Use No     History  Drug Use No    Social History   Social History  . Marital status: Single    Spouse name: N/A  . Number of children: N/A  . Years of education: N/A   Social History Main Topics  . Smoking status: Current Every Day Smoker    Packs/day: 0.50    Types: Cigarettes  . Smokeless tobacco: None  . Alcohol use No  . Drug use: No  . Sexual activity: Not Asked   Other  Topics Concern  . None   Social History Narrative  . None   Additional Social History:                         Sleep: Fair  Appetite:  Fair  Current Medications: Current Facility-Administered Medications  Medication Dose Route Frequency Provider Last Rate Last Dose  . acetaminophen (TYLENOL) tablet 650 mg  650 mg Oral Q6H PRN Gonzella Lex, MD   650 mg at 03/16/16 2035  . alum & mag hydroxide-simeth (MAALOX/MYLANTA) 200-200-20 MG/5ML suspension 30 mL  30 mL Oral Q4H PRN Gonzella Lex, MD      . clonazePAM (KLONOPIN) tablet 1 mg  1 mg Oral TID PRN Clovis Fredrickson, MD   1 mg at 03/15/16 1451  . feeding supplement (ENSURE ENLIVE) (ENSURE ENLIVE) liquid 237 mL  237 mL Oral BID BM Wynema Garoutte B Ameka Krigbaum, MD   237 mL at 03/23/16 1400  . haloperidol (HALDOL) tablet 5 mg  5 mg Oral Q6H PRN Clovis Fredrickson, MD   5 mg at 03/18/16 2059  . hydrOXYzine (ATARAX/VISTARIL) tablet 50 mg  50 mg Oral Q6H PRN Clovis Fredrickson, MD   50 mg at 03/18/16 2059  . magnesium hydroxide (MILK OF MAGNESIA) suspension 30 mL  30 mL Oral Daily PRN Gonzella Lex, MD      . phenytoin (DILANTIN) ER capsule 100 mg  100 mg Oral TID Gonzella Lex, MD  100 mg at 03/23/16 0934  . QUEtiapine (SEROQUEL) tablet 200 mg  200 mg Oral TID PRN Jacalyn Biggs B Jaquise Faux, MD      . QUEtiapine (SEROQUEL) tablet 800 mg  800 mg Oral QHS Atari Novick B Lynisha Osuch, MD   800 mg at 03/22/16 2158    Lab Results: No results found for this or any previous visit (from the past 48 hour(s)).  Blood Alcohol level:  Lab Results  Component Value Date   ETH <5 21/22/4825    Metabolic Disorder Labs: Lab Results  Component Value Date   HGBA1C 4.9 03/12/2016   Lab Results  Component Value Date   PROLACTIN 50.4 (H) 03/12/2016   Lab Results  Component Value Date   CHOL 174 03/12/2016   TRIG 75 03/12/2016   HDL 37 (L) 03/12/2016   CHOLHDL 4.7 03/12/2016   VLDL 15 03/12/2016   LDLCALC 122 (H) 03/12/2016    Physical  Findings: AIMS: Facial and Oral Movements Muscles of Facial Expression: None, normal Lips and Perioral Area: None, normal Jaw: None, normal Tongue: None, normal,Extremity Movements Upper (arms, wrists, hands, fingers): None, normal Lower (legs, knees, ankles, toes): None, normal, Trunk Movements Neck, shoulders, hips: None, normal, Overall Severity Severity of abnormal movements (highest score from questions above): None, normal Incapacitation due to abnormal movements: None, normal Patient's awareness of abnormal movements (rate only patient's report): No Awareness, Dental Status Current problems with teeth and/or dentures?: No Does patient usually wear dentures?: No  CIWA:    COWS:     Musculoskeletal: Strength & Muscle Tone: within normal limits Gait & Station: normal Patient leans: N/A  Psychiatric Specialty Exam: Physical Exam  Nursing note and vitals reviewed.   Review of Systems  Psychiatric/Behavioral: Positive for hallucinations.  All other systems reviewed and are negative.   Blood pressure 110/82, pulse 94, temperature 98.4 F (36.9 C), resp. rate 18, height 5' 4"  (1.626 m), weight 56.2 kg (124 lb), last menstrual period 03/11/2016, SpO2 99 %.Body mass index is 21.28 kg/m.  General Appearance: Casual  Eye Contact:  Good  Speech:  Clear and Coherent  Volume:  Normal  Mood:  Anxious  Affect:  Labile  Thought Process:  Disorganized  Orientation:  Full (Time, Place, and Person)  Thought Content:  Delusions and Paranoid Ideation  Suicidal Thoughts:  No  Homicidal Thoughts:  No  Memory:  Immediate;   Fair Recent;   Fair Remote;   Fair  Judgement:  Poor  Insight:  Lacking  Psychomotor Activity:  Normal  Concentration:  Concentration: Fair and Attention Span: Fair  Recall:  [employer] of Knowledge:  Fair  Language:  Fair  Akathisia:  No  Handed:  Right  AIMS (if indicated):     Assets:  Communication Skills Desire for Improvement Financial  Resources/Insurance Physical Health Resilience Social Support  ADL's:  Intact  Cognition:  WNL  Sleep:  Number of Hours: 7.3     Treatment Plan Summary: Daily contact with patient to assess and evaluate symptoms and progress in treatment and Medication management   Ms. Helfman is a 45 year old female with a history of psychosis and mood instability admitted for worsening of symptoms and bizarre, dangerous behavior.   1. Agitation. Seroquel as needed is available.  2. Mood. The patient was restarted on Seroquel for mood stabilization and psychosis.   3. Insomnia. Resolved with Seroquel.  4. Seizures. The patient has been treated with Dilantin. Dilantin level is low at 5.3.   5. Smoking. She refuses a  patch and asks for a cigarette.  6. Substance abuse. The patient minimizes her problems and declines treatment.  7. Metabolic syndrome monitoring. Lipid profile, TSH, hemoglobin A1c are normal. Prolactin 50.4.  8. Social. CPS referral was made. Another meeting with [place] worker completed. CPS will try to contact family/friends to try to find housing for the patient and her 2 children. The patient still does not appreciate the severity of her situation.  9. Trichomonas infection. She completed Metronizadol course.  10. Disposition. To be established. Reportedly the patient and her children are homeless. The children are in the care of relatives with CPS involvement. We made referral to [facility].  I certify that the services received since the previous certification/recertification were and continue to be medically necessary as the treatment provided can be reasonably expected to improve the patient's condition; the medical record documents that the services furnished were intensive treatment services or their equivalent services, and this patient continues to need, on a daily basis, active treatment furnished directly by or requiring the supervision of inpatient psychiatric  personnel.    Orson Slick, MD 03/23/2016, 2:24 PM

## 2016-03-23 NOTE — Progress Notes (Signed)
D:  Pleasant and cooperative this shift.  Denies SI/HI/AVH.  Noted at times to be responding to internal stimuli.  Up in dayroom dancing with peers.  A: Support and encouragement offered.  Safety maintained.  Scheduled medications given.  R:  Medication and group compliant. Safety maintained.

## 2016-03-23 NOTE — Progress Notes (Signed)
D: Patient is alert and oriented on the unit this shift. Patient attended  in groups today. Patient denies suicidal ideation, homicidal ideation, auditory or visual hallucinations at the present time.  A: Scheduled medications are administered to patient as per MD orders. Emotional support and encouragement are provided. Patient is maintained on q.15 minute safety checks. Patient is informed to notify staff with questions or concerns. R: No adverse medication reactions are noted. Patient is cooperative with medication administration and treatment plan today. Patient is angry on phone with relatives but cooperative on the unit at this time. Patient interacts   with others on the unit this shift. Patient contracts for safety at this time. Patient remains safe at this time. Anxiety 7/10 Depression 2/10

## 2016-03-23 NOTE — Plan of Care (Signed)
Problem: Coping: Goal: Ability to verbalize frustrations and anger appropriately will improve Outcome: Progressing Patient able to verbalize frustrations at this time [place] RN   

## 2016-03-23 NOTE — Progress Notes (Signed)
D: Pt denies SI/HI/AVH. Pt is pleasant and cooperative. Pt forwards little, but is very cooperative upon approach. Pt will talk if engaged.   A: Pt was offered support and encouragement. Pt was given scheduled medications. Pt was encourage to attend groups. Q 15 minute checks were done for safety.   R:Pt attends groups and interacts well with peers and staff. Pt is taking medication. Pt has no complaints.Pt receptive to treatment and safety maintained on unit.

## 2016-03-24 NOTE — Tx Team (Signed)
Interdisciplinary Treatment Plan Update (Adult)  Date:  03/24/2016 Time Reviewed:  3:12 PM  Progress in Treatment: Attending groups: Yes. Participating in groups:  Yes. Taking medication as prescribed:  Yes. Tolerating medication:  Yes. Family/Significant othe contact made:  Yes, individual(s) contacted:  Jearld Adjutant [phone] Patient understands diagnosis:  No. Discussing patient identified problems/goals with staff:  No. Medical problems stabilized or resolved:  Yes. Denies suicidal/homicidal ideation: Yes. Issues/concerns per patient self-inventory:  No. Other:  New problem(s) identified: .n/a  Discharge Plan or Barriers: Pt has not been able to participate in her discharge planning, as evidenced by, threatening staff, becoming argumentative, and no insight into her current living situation. Pt still thinks she can return to a home that she was evicted from prior to discharge.   Reason for Continuation of Hospitalization: Anxiety Delusions  Depression Medication stabilization  Comments: n/a  Estimated length of stay: 3 to 5 days  New goal(s):  n/a  Review of initial/current patient goals per problem list:   1.? Goal(s): Patient will participate in aftercare plan o Met: No?  o Target date: at discharge  o As evidenced by: Patient will participate within aftercare plan AEB aftercare provider and housing plan at discharge being identified.  ? 2.? Goal (s): Patient will exhibit decreased depressive symptoms and suicidal ideations. o Met: No?  o ?Target date: at discharge  o As evidenced by: Patient will utilize self rating of depression at 3 or below and demonstrate decreased signs of depression or be deemed stable for discharge by MD.  ? 3.? Goal(s): Patient will demonstrate decreased signs and symptoms of anxiety. o Met: No?  o Target date: at discharge  o As evidenced by: Patient will utilize self rating of anxiety at 3 or below and demonstrated decreased signs  of anxiety, or be deemed stable for discharge by MD   4.? Goal (s): Patient will demonstrate decreased symptoms of Mania. o Met:?No  o ?Target date: at discharge  o As evidenced by: Patient will not endorse signs of mania or be deemed stable for discharge by MD.    Attendees: Patient:  Karina Anderson 7/27/20173:12 PM  Family:   7/27/20173:12 PM  Physician:  Dr. Bary Leriche, MD 7/27/20173:12 PM  Nursing:   Elige Radon, RN 7/27/20173:12 PM  Case Manager:   7/27/20173:12 PM  Counselor:   7/27/20173:12 PM  Other:  Lear Ng. Claybon Jabs MSW, [place] 7/27/20173:12 PM  Other:   7/27/20173:12 PM  Other:   7/27/20173:12 PM  Other:  7/27/20173:12 PM  Other:  7/27/20173:12 PM  Other:  7/27/20173:12 PM  Other:  7/27/20173:12 PM  Other:  7/27/20173:12 PM  Other:  7/27/20173:12 PM  Other:   7/27/20173:12 PM   Scribe for Treatment Team:   Lear Ng. [place], [place]  03/24/2016, 3:19 PM

## 2016-03-24 NOTE — Progress Notes (Signed)
[facility] MD Progress Note  03/24/2016 2:37 PM [place]  MRN:  [id number]  Subjective:  Ms. Knights immensely immediate discharge but she is still disorganized, paranoid and delusional. She met with CPS caseworker yesterday but is not really able to absorb all the information provided. She will be allowed to be with her children and stay at her godfather's place after the first of the month and when CPS approves. There is no other safe discharge plan. The patient demands to be discharge immediately so she can visit her mother's grave on Sunday. This is very noble but rather unrealistic as she has no way to get there. She accepts medications, tolerates them well, and participates in programming. She is still rather volatile and irritable.  Principal Problem: Bipolar affective disorder, manic, severe, with psychotic behavior ([place]) Diagnosis:   Patient Active Problem List   Diagnosis Date Noted  . Cannabis use disorder, moderate, dependence ([place]) [F12.20] 03/13/2016    Priority: Medium  . Involuntary commitment [Z04.6] 03/11/2016    Priority: Medium  . Tobacco use disorder [F17.200] 03/13/2016  . Bipolar affective disorder, manic, severe, with psychotic behavior ([place]) [F31.2] 03/12/2016  . Seizures ([place]) [R56.9] 03/11/2016   Total Time spent with patient: 20 minutes  Past Psychiatric History: Bipolar disorder.  Past Medical History:  Past Medical History:  Diagnosis Date  . Diabetes mellitus without complication ([place])   . Seizures ([place])    History reviewed. No pertinent surgical history. Family History: History reviewed. No pertinent family history. Family Psychiatric  History: See H&P. Social History:  History  Alcohol Use No     History  Drug Use No    Social History   Social History  . Marital status: Single    Spouse name: N/A  . Number of children: N/A  . Years of education: N/A   Social History Main Topics  . Smoking status: Current Every Day Smoker    Packs/day: 0.50     Types: Cigarettes  . Smokeless tobacco: None  . Alcohol use No  . Drug use: No  . Sexual activity: Not Asked   Other Topics Concern  . None   Social History Narrative  . None   Additional Social History:                         Sleep: Fair  Appetite:  Fair  Current Medications: Current Facility-Administered Medications  Medication Dose Route Frequency Provider Last Rate Last Dose  . acetaminophen (TYLENOL) tablet 650 mg  650 mg Oral Q6H PRN Gonzella Lex, MD   650 mg at 03/16/16 2035  . alum & mag hydroxide-simeth (MAALOX/MYLANTA) 200-200-20 MG/5ML suspension 30 mL  30 mL Oral Q4H PRN Gonzella Lex, MD      . clonazePAM (KLONOPIN) tablet 1 mg  1 mg Oral TID PRN Clovis Fredrickson, MD   1 mg at 03/15/16 1451  . feeding supplement (ENSURE ENLIVE) (ENSURE ENLIVE) liquid 237 mL  237 mL Oral BID BM Jolanta B Pucilowska, MD   237 mL at 03/24/16 1000  . haloperidol (HALDOL) tablet 5 mg  5 mg Oral Q6H PRN Clovis Fredrickson, MD   5 mg at 03/18/16 2059  . hydrOXYzine (ATARAX/VISTARIL) tablet 50 mg  50 mg Oral Q6H PRN Clovis Fredrickson, MD   50 mg at 03/18/16 2059  . magnesium hydroxide (MILK OF MAGNESIA) suspension 30 mL  30 mL Oral Daily PRN Gonzella Lex, MD   30 mL  at 03/23/16 2200  . phenytoin (DILANTIN) ER capsule 100 mg  100 mg Oral TID Gonzella Lex, MD   100 mg at 03/24/16 0902  . QUEtiapine (SEROQUEL) tablet 200 mg  200 mg Oral TID PRN Jolanta B Pucilowska, MD      . QUEtiapine (SEROQUEL) tablet 800 mg  800 mg Oral QHS Jolanta B Pucilowska, MD   800 mg at 03/23/16 2158    Lab Results: No results found for this or any previous visit (from the past 48 hour(s)).  Blood Alcohol level:  Lab Results  Component Value Date   ETH <5 71/01/2693    Metabolic Disorder Labs: Lab Results  Component Value Date   HGBA1C 4.9 03/12/2016   Lab Results  Component Value Date   PROLACTIN 50.4 (H) 03/12/2016   Lab Results  Component Value Date   CHOL 174  03/12/2016   TRIG 75 03/12/2016   HDL 37 (L) 03/12/2016   CHOLHDL 4.7 03/12/2016   VLDL 15 03/12/2016   LDLCALC 122 (H) 03/12/2016    Physical Findings: AIMS: Facial and Oral Movements Muscles of Facial Expression: None, normal Lips and Perioral Area: None, normal Jaw: None, normal Tongue: None, normal,Extremity Movements Upper (arms, wrists, hands, fingers): None, normal Lower (legs, knees, ankles, toes): None, normal, Trunk Movements Neck, shoulders, hips: None, normal, Overall Severity Severity of abnormal movements (highest score from questions above): None, normal Incapacitation due to abnormal movements: None, normal Patient's awareness of abnormal movements (rate only patient's report): No Awareness, Dental Status Current problems with teeth and/or dentures?: No Does patient usually wear dentures?: No  CIWA:    COWS:     Musculoskeletal: Strength & Muscle Tone: within normal limits Gait & Station: normal Patient leans: N/A  Psychiatric Specialty Exam: Physical Exam  Nursing note and vitals reviewed.   Review of Systems  Psychiatric/Behavioral: Positive for hallucinations.  All other systems reviewed and are negative.   Blood pressure 109/76, pulse 95, temperature 97.9 F (36.6 C), temperature source Oral, resp. rate 18, height 5' 4"  (1.626 m), weight 56.2 kg (124 lb), last menstrual period 03/11/2016, SpO2 99 %.Body mass index is 21.28 kg/m.  General Appearance: Casual  Eye Contact:  Good  Speech:  Pressured  Volume:  Increased  Mood:  Angry, Anxious, Dysphoric and Irritable  Affect:  Congruent  Thought Process:  Disorganized  Orientation:  Full (Time, Place, and Person)  Thought Content:  Delusions and Paranoid Ideation  Suicidal Thoughts:  No  Homicidal Thoughts:  No  Memory:  Immediate;   Fair Recent;   Fair Remote;   Fair  Judgement:  Poor  Insight:  Lacking  Psychomotor Activity:  Increased  Concentration:  Concentration: Fair and Attention Span:  Fair  Recall:  [employer] of Knowledge:  Fair  Language:  Fair  Akathisia:  No  Handed:  Right  AIMS (if indicated):     Assets:  Communication Skills Desire for Improvement Financial Resources/Insurance Physical Health Resilience Social Support  ADL's:  Intact  Cognition:  WNL  Sleep:  Number of Hours: 6.45     Treatment Plan Summary: Daily contact with patient to assess and evaluate symptoms and progress in treatment and Medication management   Ms. Mcenery is a 45 year old female with a history of psychosis and mood instability admitted for worsening of symptoms and bizarre,dangerous behavior.   1. Agitation. Seroquel as needed is available.  2. Mood. The patient was restarted on Seroquel for mood stabilization and psychosis.   3. Insomnia.  Resolved with Seroquel.  4. Seizures. The patient has been treated with Dilantin. Dilantin level is low at 5.3.   5. Smoking. She refuses a patch and asks for a cigarette.  6. Substance abuse. The patient minimizes her problems and declines treatment.  7. Metabolic syndrome monitoring. Lipid profile, TSH, hemoglobin A1c are normal. Prolactin 50.4.  8. Social. CPS referral was made. Another meeting with [place] worker completed. CPS will try to contact family/friends to try to find housing for the patient and her 2 children. The patient still does not appreciate the severity of her situation.  9. Trichomonas infection. She completed Metronizadol course.  10. Disposition. To be established. She might be discharged to [facility] with Mr. Jearld Adjutant with CPS approval no sooner then beginning of August.  Orson Slick, MD 03/24/2016, 2:37 PM

## 2016-03-24 NOTE — [facility] Group Notes (Signed)
Goals Group Date/Time: 03/24/2016 9:00 AM Type of Therapy and Topic: Group Therapy: Goals Group: SMART Goals   Participation Level: Moderate  Description of Group:    The purpose of a daily goals group is to assist and guide patients in setting recovery/wellness-related goals. The objective is to set goals as they relate to the crisis in which they were admitted. Patients will be using SMART goal modalities to set measurable goals. Characteristics of realistic goals will be discussed and patients will be assisted in setting and processing how one will reach their goal. Facilitator will also assist patients in applying interventions and coping skills learned in psycho-education groups to the SMART goal and process how one will achieve defined goal.   Therapeutic Goals:   -Patients will develop and document one goal related to or their crisis in which brought them into treatment.  -Patients will be guided by LCSW using SMART goal setting modality in how to set a measurable, attainable, realistic and time sensitive goal.  -Patients will process barriers in reaching goal.  -Patients will process interventions in how to overcome and successful in reaching goal.   Patient's Goal:  Pt shared the pt's goal is to return to school.  Pt shared the pt's primary motivation for achieving the pt's goal is the welfare of the pt's children.  Pt was polite and cooperative with the CSW and other group members and focused and attentive to the topics discussed and the sharing of others.  Pt did, at times, begin extensive monologues with thoughts that were disconnected from one another and did begin to present as angry as evidenced by the pt's voice rising to higher pitches and tempos, but the pt was easily redirectable.  Otherwise the pt was polite and cooperative with the CSW and other group members and focused and attentive to the topics discussed and the sharing of others.    Therapeutic Modalities:  Motivational  Interviewing  Research officer, political party  SMART goals setting   Dorothe Pea. Ashiah Karpowicz, LCSWA, LCAS

## 2016-03-24 NOTE — Progress Notes (Signed)
D:  Pleasant and cooperative.  Denies SI/HI/AVH.  Fixated on talking to her landlord.  Verbalizes that he is doing is not right and that he owes her some money back.   A:  Support and encouragement offered.  Q 15 minute rounding done.  Scheduled medications given. R:  Medication and group compliant.  Safety maintained.

## 2016-03-24 NOTE — [facility] Group Notes (Signed)
[facility] LCSW Group Therapy   03/24/2016 9:30 am   Type of Therapy: Group Therapy   Participation Level: Active   Participation Quality: Attentive, Sharing and Supportive   Affect: Appropriate   Cognitive: Alert and Oriented   Insight: Developing/Improving and Engaged   Engagement in Therapy: Developing/Improving and Engaged   Modes of Intervention: Clarification, Confrontation, Discussion, Education, Exploration, Limit-setting, Orientation, Problem-solving, Rapport Building, Dance movement psychotherapist, Socialization and Support   Summary of Progress/Problems: The topic for group was balance in life. Today's group focused on defining balance in one's own words, identifying things that can knock one off balance, and exploring healthy ways to maintain balance in life. Group members were asked to provide an example of a time when they felt off balance, describe how they handled that situation, and process healthier ways to regain balance in the future. Group members were asked to share the most important tool for maintaining balance that they learned while at [facility] and how they plan to apply this method after discharge. Pt reported that for the pt attaining balance in the pt's life meant showing compassion and finding yourself in service, at the present.  Pt shared that on one occasion the pt was hospitalized by her father and as a result, the pt felt "out of balance".  Pt shared that the pt's primary tool for achieving balance is to focus on the welfare of her children.  Pt was polite and cooperative with the CSW and other group members and focused and attentive to the topics discussed and the sharing of others.  Pt seems to have made great improvement during group, as evidenced by sharing in moderation sharing on the topics discussed, and only sharing at length when prompted and pt presents a happy and increasingly energetic, as compared to previous sessions.  CSW actively validated the pt's opinion and provided  feedback. Pt did present as agitated at times, but was easily redirectable.

## 2016-03-24 NOTE — [facility] Group Notes (Signed)
[facility] Group Notes:  (Nursing/MHT/Case Management/Adjunct)  Date:  03/24/2016  Time:  3:30 AM  Type of Therapy:  Group Therapy  Participation Level:  Active  Participation Quality:  Appropriate  Affect:  Appropriate  Cognitive:  Appropriate  Insight:  Appropriate  Engagement in Group:  Engaged  Modes of Intervention:  n/a  Summary of Progress/Problems:  Karina Anderson 03/24/2016, 3:30 AM

## 2016-03-25 NOTE — Progress Notes (Signed)
[facility] MD Progress Note  03/25/2016 5:58 PM Karina Anderson  MRN:  [id number]  Subjective:    Karina Anderson is a 45 year old female with history of bipolar disorder admitted in a manic psychotic, disorganized episode. During current episode she lost her housing and boyfriend. CPS is involved as her kids became homeless when she came to the hospital. They are now in the family member. The patient is a low to get her kids back if she finding adequate living arrangement. Per CPS worker report the patient will be able to move with her friend Reggie in August as long as she agrees to his terms.   Today the patient has been very pleasant with me and understands that she will not be discharged on Sunday in order to go to her mother's grave on her mother's birthday. She denies any symptoms of depression anxiety or psychosis. She has no somatic complaints. She takes medications as prescribed and participates in programming. However when this CPS worker came by again to meet with the patient she seemed not to understand the conditions of her discharge and getting her children back. She became very upset, loud, and argumentative. Her thoughts were disorganized again and she was unable to keep her cool. We worry that if this behavior continues in the community the patient will lose custody of her children.  Principal Problem: Bipolar affective disorder, manic, severe, with psychotic behavior (HCC) Diagnosis:   Patient Active Problem List   Diagnosis Date Noted  . Cannabis use disorder, moderate, dependence (HCC) [F12.20] 03/13/2016    Priority: Medium  . Involuntary commitment [Z04.6] 03/11/2016    Priority: Medium  . Tobacco use disorder [F17.200] 03/13/2016  . Bipolar affective disorder, manic, severe, with psychotic behavior (HCC) [F31.2] 03/12/2016  . Seizures (HCC) [R56.9] 03/11/2016   Total Time spent with patient: 20 minutes  Past Psychiatric History: Bipolar disorder.  Past Medical History:  Past  Medical History:  Diagnosis Date  . Diabetes mellitus without complication (HCC)   . Seizures (HCC)    History reviewed. No pertinent surgical history. Family History: History reviewed. No pertinent family history. Family Psychiatric  History: See H&P. Social History:  History  Alcohol Use No     History  Drug Use No    Social History   Social History  . Marital status: Single    Spouse name: N/A  . Number of children: N/A  . Years of education: N/A   Social History Main Topics  . Smoking status: Current Every Day Smoker    Packs/day: 0.50    Types: Cigarettes  . Smokeless tobacco: None  . Alcohol use No  . Drug use: No  . Sexual activity: Not Asked   Other Topics Concern  . None   Social History Narrative  . None   Additional Social History:                         Sleep: Fair  Appetite:  Fair  Current Medications: Current Facility-Administered Medications  Medication Dose Route Frequency Provider Last Rate Last Dose  . acetaminophen (TYLENOL) tablet 650 mg  650 mg Oral Q6H PRN Audery Amel, MD   650 mg at 03/16/16 2035  . alum & mag hydroxide-simeth (MAALOX/MYLANTA) 200-200-20 MG/5ML suspension 30 mL  30 mL Oral Q4H PRN Audery Amel, MD      . clonazePAM (KLONOPIN) tablet 1 mg  1 mg Oral TID PRN Shari Prows, MD   1 mg  at 03/15/16 1451  . feeding supplement (ENSURE ENLIVE) (ENSURE ENLIVE) liquid 237 mL  237 mL Oral BID BM Consetta Cosner B Elma Shands, MD   237 mL at 03/25/16 1400  . haloperidol (HALDOL) tablet 5 mg  5 mg Oral Q6H PRN Shari Prows, MD   5 mg at 03/18/16 2059  . hydrOXYzine (ATARAX/VISTARIL) tablet 50 mg  50 mg Oral Q6H PRN Shari Prows, MD   50 mg at 03/18/16 2059  . magnesium hydroxide (MILK OF MAGNESIA) suspension 30 mL  30 mL Oral Daily PRN Audery Amel, MD   30 mL at 03/23/16 2200  . phenytoin (DILANTIN) ER capsule 100 mg  100 mg Oral TID Audery Amel, MD   100 mg at 03/25/16 1741  . QUEtiapine (SEROQUEL)  tablet 200 mg  200 mg Oral TID PRN Marijayne Rauth B Kyren Knick, MD      . QUEtiapine (SEROQUEL) tablet 800 mg  800 mg Oral QHS Corinthia Helmers B Remy Voiles, MD   800 mg at 03/24/16 2131    Lab Results: No results found for this or any previous visit (from the past 48 hour(s)).  Blood Alcohol level:  Lab Results  Component Value Date   ETH <5 03/11/2016    Metabolic Disorder Labs: Lab Results  Component Value Date   HGBA1C 4.9 03/12/2016   Lab Results  Component Value Date   PROLACTIN 50.4 (H) 03/12/2016   Lab Results  Component Value Date   CHOL 174 03/12/2016   TRIG 75 03/12/2016   HDL 37 (L) 03/12/2016   CHOLHDL 4.7 03/12/2016   VLDL 15 03/12/2016   LDLCALC 122 (H) 03/12/2016    Physical Findings: AIMS: Facial and Oral Movements Muscles of Facial Expression: None, normal Lips and Perioral Area: None, normal Jaw: None, normal Tongue: None, normal,Extremity Movements Upper (arms, wrists, hands, fingers): None, normal Lower (legs, knees, ankles, toes): None, normal, Trunk Movements Neck, shoulders, hips: None, normal, Overall Severity Severity of abnormal movements (highest score from questions above): None, normal Incapacitation due to abnormal movements: None, normal Patient's awareness of abnormal movements (rate only patient's report): No Awareness, Dental Status Current problems with teeth and/or dentures?: No Does patient usually wear dentures?: No  CIWA:    COWS:     Musculoskeletal: Strength & Muscle Tone: within normal limits Gait & Station: normal Patient leans: N/A  Psychiatric Specialty Exam: Physical Exam  Nursing note and vitals reviewed.   Review of Systems  Psychiatric/Behavioral: Positive for hallucinations.  All other systems reviewed and are negative.   Blood pressure 101/71, pulse 81, temperature 98.2 F (36.8 C), resp. rate 18, height  (1.626 m), weight 56.2 kg (124 lb), last menstrual period 03/11/2016, SpO2 99 %.Body mass index is 21.28 kg/m.   General Appearance: Casual  Eye Contact:  Good  Speech:  Pressured  Volume:  Increased  Mood:  Angry, Anxious, Dysphoric and Irritable  Affect:  Congruent  Thought Process:  Disorganized  Orientation:  Full (Time, Place, and Person)  Thought Content:  Delusions and Paranoid Ideation  Suicidal Thoughts:  No  Homicidal Thoughts:  No  Memory:  Immediate;   Fair Recent;   Fair Remote;   Fair  Judgement:  Poor  Insight:  Lacking  Psychomotor Activity:  Increased  Concentration:  Concentration: Fair and Attention Span: Fair  Recall:  [employer] of Knowledge:  Fair  Language:  Fair  Akathisia:  No  Handed:  Right  AIMS (if indicated):     Assets:  Communication  Skills Desire for Improvement Financial Resources/Insurance Physical Health Resilience Social Support  ADL's:  Intact  Cognition:  WNL  Sleep:  Number of Hours: 6     Treatment Plan Summary: Daily contact with patient to assess and evaluate symptoms and progress in treatment and Medication management   Karina Anderson is a 45 year old female with a history of psychosis and mood instability admitted for worsening of symptoms and bizarre,dangerous behavior.   1. Agitation. Seroquel as needed is available.  2. Mood. The patient was restarted on Seroquel for mood stabilization and psychosis.   3. Insomnia. Resolved with Seroquel.  4. Seizures. The patient has been treated with Dilantin. Dilantin level was low at 5.3.   5. Smoking. She refuses a patch and asks for a cigarette.  6. Substance abuse. The patient minimizes her problems and declines treatment.  7. Metabolic syndrome monitoring. Lipid profile, TSH, hemoglobin A1c are normal. Prolactin 50.4.  8. Social. CPS referral was made. Another meeting with CPS worker completed. CPS identified a friends who could provide housing for the patient and her 2 children. The patient still does not appreciate the severity of her situation.  9. Trichomonas infection.  She completed Metronizadol course.  10. Disposition. To be established. She might be discharged with Reggie with CPS approval no sooner then beginning of August.  Kristine Linea, MD 03/25/2016, 5:58 PM

## 2016-03-25 NOTE — Progress Notes (Signed)
Patient easily irritated.  Labile.   Became upset when her room was accidentally cleaned as a d/c room.  Refuses to listen to what she does not want to hear.   Safety maintained.

## 2016-03-25 NOTE — Progress Notes (Signed)
Patient ID: Karina Anderson, female   DOB: 12/22/1970, 45 y.o.   MRN: [id number]  CSW met with pt and CPS case worker Karina Anderson [phone]. Karina Anderson attempted to explain to pt that Mr. Karina Anderson (friend of Karina Anderson) would agree to take both pt and her kids in his home if pt agreed to take medications and not to damage property. Pt stated "I refuse to stay anywhere without my damn kids". Both Karina Anderson and CSW attempted to redirect pt to inform her that this was good news. Pt became increasingly argumentative as evidenced by stating "I'm not agreeing to nothing, I'm about to sue everybody!" Pt would not let CSW or Karina Anderson explain anything further and did not have insight to the situation. Pt stated to Karina Anderson, "I need another damn social worker because this heifer ain't doing nothing, she don't even have kids". CSW attempted to explain to pt the process of planning a safe discharge for pt and have appropriate follow-up for aftercare. Pt stated to Karina Anderson "I want to get the keys to my apartment so my kids can stay with me". Karina Anderson informed pt that her keys were in her belongings on the unit". Karina Anderson also explained that due to pt not having any lights, food, running water, furniture, pt would not be able to stay there with her kids. Pt began to yell and stated "I do what the hell I want, yall can't tell me what to do!" CSW and Karina Anderson attempted to redirect pt and talk about planning a safe discharge however pt became increasingly loud and began clenching her fist. CSW determined at that time to end the conversation and let Karina Anderson off the unit for safety. CSW reported situation to nursing staff.   Karina Anderson G. [place], [place] 03/25/2016 2:42 PM

## 2016-03-25 NOTE — [facility] Group Notes (Signed)
[facility] LCSW Group Therapy   03/25/2016 9:30 AM   Type of Therapy: Group Therapy   Participation Level: Active   Participation Quality: Attentive, Sharing and Supportive   Affect: Appropriate   Cognitive: Alert and Oriented   Insight: Developing/Improving and Engaged   Engagement in Therapy: Developing/Improving and Engaged   Modes of Intervention: Clarification, Confrontation, Discussion, Education, Exploration, Limit-setting, Orientation, Problem-solving, Rapport Building, Dance movement psychotherapist, Socialization and Support   Summary of Progress/Problems: The topic for today was feelings about relapse. Pt discussed what relapse prevention is to them and identified triggers that they are on the path to relapse. Pt processed their feeling towards relapse and was able to relate to peers. Pt discussed coping skills that can be used for relapse prevention. Pt reported that for the pt relapse means "moving backwards", or "same as before". Pt shared that one means she uses to prevent relapse is to stay away from "disrespectful people" and at times to "write poetry" in order to cope with thoughts of "being hurt by another person'.  Pt shares that she feels better about relapse after sharing about it in group.  Pt seems to have made great improvement during group, as evidenced by increased moderate sharing, sharing at length only when prompted and pt presents as more happy and positive, as compared to previous sessions.  CSW actively validated the pt's opinion and provided feedback.      Dorothe Pea. Eternity Dexter, MSW, LCSWA, LCAS

## 2016-03-25 NOTE — Plan of Care (Signed)
Problem: Education: Goal: Emotional status will improve Outcome: Progressing D: Observed pt in dayroom interacting with peers. Patient alert and oriented x4. Patient denies SI/HI/AVH. Pt affect is appropriate. Pt stated her mood is "very good." Pt rated depression 0/10 and anxiety 0/10. Pt talked about being hopeful, stating "tomorrow will be a better day...looking forward to seeing my girls." Pt had no complaints. A: Offered active listening and support. Provided therapeutic communication. Administered scheduled medications.  R: Pt pleasant and cooperative. Pt medication compliant. Will continue Q15 min. checks. Safety maintained.

## 2016-03-26 NOTE — Progress Notes (Signed)
[facility] MD Progress Note  03/26/2016 4:58 PM Christie Ernster  MRN:  [id number]  Subjective:    Ms. Reuter is a 45 year old female with history of bipolar disorder admitted in a manic psychotic, disorganized episode. During current episode she lost her housing and boyfriend. CPS is involved as her kids became homeless when she came to the hospital. They are now in the family member. The patient is a low to get her kids back if she finding adequate living arrangement.  Patient was focus on having the SW here help her to get furniture for her house.  She says she feels the SW here does not want to help her.  Other than that she denies depression, anxiety, problems with appetite, energy or concentration.  Denies SI, HI or hallucinations.  Denies SE from meds or physical complaints.    Principal Problem: Bipolar affective disorder, manic, severe, with psychotic behavior (HCC) Diagnosis:   Patient Active Problem List   Diagnosis Date Noted  . Tobacco use disorder [F17.200] 03/13/2016  . Cannabis use disorder, moderate, dependence (HCC) [F12.20] 03/13/2016  . Bipolar affective disorder, manic, severe, with psychotic behavior (HCC) [F31.2] 03/12/2016  . Seizures (HCC) [R56.9] 03/11/2016  . Involuntary commitment [Z04.6] 03/11/2016   Total Time spent with patient: 20 minutes  Past Psychiatric History: Bipolar disorder.  Past Medical History:  Past Medical History:  Diagnosis Date  . Diabetes mellitus without complication (HCC)   . Seizures (HCC)    History reviewed. No pertinent surgical history. Family History: History reviewed. No pertinent family history. Family Psychiatric  History: See H&P. Social History:  History  Alcohol Use No     History  Drug Use No    Social History   Social History  . Marital status: Single    Spouse name: N/A  . Number of children: N/A  . Years of education: N/A   Social History Main Topics  . Smoking status: Current Every Day Smoker    Packs/day:  0.50    Types: Cigarettes  . Smokeless tobacco: None  . Alcohol use No  . Drug use: No  . Sexual activity: Not Asked   Other Topics Concern  . None   Social History Narrative  . None   Additional Social History:                         Sleep: Fair  Appetite:  Fair  Current Medications: Current Facility-Administered Medications  Medication Dose Route Frequency Provider Last Rate Last Dose  . acetaminophen (TYLENOL) tablet 650 mg  650 mg Oral Q6H PRN Audery Amel, MD   650 mg at 03/16/16 2035  . alum & mag hydroxide-simeth (MAALOX/MYLANTA) 200-200-20 MG/5ML suspension 30 mL  30 mL Oral Q4H PRN Audery Amel, MD      . clonazePAM (KLONOPIN) tablet 1 mg  1 mg Oral TID PRN Shari Prows, MD   1 mg at 03/15/16 1451  . feeding supplement (ENSURE ENLIVE) (ENSURE ENLIVE) liquid 237 mL  237 mL Oral BID BM Jolanta B Pucilowska, MD   237 mL at 03/26/16 1432  . haloperidol (HALDOL) tablet 5 mg  5 mg Oral Q6H PRN Shari Prows, MD   5 mg at 03/18/16 2059  . hydrOXYzine (ATARAX/VISTARIL) tablet 50 mg  50 mg Oral Q6H PRN Shari Prows, MD   50 mg at 03/18/16 2059  . magnesium hydroxide (MILK OF MAGNESIA) suspension 30 mL  30 mL Oral Daily PRN Jonny Ruiz  T Clapacs, MD   30 mL at 03/23/16 2200  . phenytoin (DILANTIN) ER capsule 100 mg  100 mg Oral TID Audery Amel, MD   100 mg at 03/26/16 1558  . QUEtiapine (SEROQUEL) tablet 200 mg  200 mg Oral TID PRN Jolanta B Pucilowska, MD      . QUEtiapine (SEROQUEL) tablet 800 mg  800 mg Oral QHS Jolanta B Pucilowska, MD   800 mg at 03/25/16 2210    Lab Results: No results found for this or any previous visit (from the past 48 hour(s)).  Blood Alcohol level:  Lab Results  Component Value Date   ETH <5 03/11/2016    Metabolic Disorder Labs: Lab Results  Component Value Date   HGBA1C 4.9 03/12/2016   Lab Results  Component Value Date   PROLACTIN 50.4 (H) 03/12/2016   Lab Results  Component Value Date   CHOL 174  03/12/2016   TRIG 75 03/12/2016   HDL 37 (L) 03/12/2016   CHOLHDL 4.7 03/12/2016   VLDL 15 03/12/2016   LDLCALC 122 (H) 03/12/2016    Physical Findings: AIMS: Facial and Oral Movements Muscles of Facial Expression: None, normal Lips and Perioral Area: None, normal Jaw: None, normal Tongue: None, normal,Extremity Movements Upper (arms, wrists, hands, fingers): None, normal Lower (legs, knees, ankles, toes): None, normal, Trunk Movements Neck, shoulders, hips: None, normal, Overall Severity Severity of abnormal movements (highest score from questions above): None, normal Incapacitation due to abnormal movements: None, normal Patient's awareness of abnormal movements (rate only patient's report): No Awareness, Dental Status Current problems with teeth and/or dentures?: No Does patient usually wear dentures?: No  CIWA:    COWS:     Musculoskeletal: Strength & Muscle Tone: within normal limits Gait & Station: normal Patient leans: N/A  Psychiatric Specialty Exam: Physical Exam  Nursing note and vitals reviewed. Constitutional: She is oriented to person, place, and time. She appears well-developed and well-nourished.  HENT:  Head: Normocephalic and atraumatic.  Eyes: EOM are normal.  Neck: Normal range of motion.  Respiratory: Effort normal.  Neurological: She is alert and oriented to person, place, and time.    Review of Systems  Constitutional: Negative.   HENT: Negative.   Eyes: Negative.   Respiratory: Negative.   Cardiovascular: Negative.   Gastrointestinal: Negative.   Genitourinary: Negative.   Musculoskeletal: Negative.   Skin: Negative.   Neurological: Negative.   Endo/Heme/Allergies: Negative.   Psychiatric/Behavioral: Negative.   All other systems reviewed and are negative.   Blood pressure 105/73, pulse 89, temperature 98 F (36.7 C), resp. rate 18, height 5\' 4"  (1.626 m), weight 56.2 kg (124 lb), last menstrual period 03/11/2016, SpO2 99 %.Body mass  index is 21.28 kg/m.  General Appearance: Casual  Eye Contact:  Good  Speech:  Pressured  Volume:  Increased  Mood:  Angry, Anxious, Dysphoric and Irritable  Affect:  Congruent  Thought Process:  Disorganized  Orientation:  Full (Time, Place, and Person)  Thought Content:  Delusions and Paranoid Ideation  Suicidal Thoughts:  No  Homicidal Thoughts:  No  Memory:  Immediate;   Fair Recent;   Fair Remote;   Fair  Judgement:  Poor  Insight:  Lacking  Psychomotor Activity:  Increased  Concentration:  Concentration: Fair and Attention Span: Fair  Recall:  [employer] of Knowledge:  Fair  Language:  Fair  Akathisia:  No  Handed:  Right  AIMS (if indicated):     Assets:  Communication Skills Desire for Improvement  Financial Resources/Insurance Physical Health Resilience Social Support  ADL's:  Intact  Cognition:  WNL  Sleep:  Number of Hours: 5.5     Treatment Plan Summary: Daily contact with patient to assess and evaluate symptoms and progress in treatment and Medication management   Ms. Moorhouse is a 45 year old female with a history of psychosis and mood instability admitted for worsening of symptoms and bizarre,dangerous behavior.   Improving.  No changes will be made today.  Stable.  1. Agitation. Seroquel as needed is available.  2. Mood. The patient was restarted on Seroquel for mood stabilization and psychosis.   3. Insomnia. Resolved with Seroquel.  4. Seizures. The patient has been treated with Dilantin. Dilantin level was low at 5.3.   5. Smoking. She refuses a patch and asks for a cigarette.  6. Substance abuse. The patient minimizes her problems and declines treatment.  7. Metabolic syndrome monitoring. Lipid profile, TSH, hemoglobin A1c are normal. Prolactin 50.4.  8. Social. CPS referral was made. Another meeting with CPS worker completed. CPS identified a friends who could provide housing for the patient and her 2 children. The patient still  does not appreciate the severity of her situation.  9. Trichomonas infection. She completed Metronizadol course.  10. Disposition. To be established. She might be discharged with Reggie with CPS approval no sooner then beginning of August.  Jimmy Footman, MD 03/26/2016, 4:58 PM

## 2016-03-26 NOTE — [facility] Group Notes (Signed)
[facility] Group Notes:  (Nursing/MHT/Case Management/Adjunct)  Date:  03/26/2016  Time:  9:28 PM  Type of Therapy:  Evening Wrap-up Group  Participation Level:  Active  Participation Quality:  Appropriate and Attentive  Affect:  Appropriate  Cognitive:  Alert and Appropriate  Insight:  Appropriate, Good and Improving  Engagement in Group:  Engaged and Improving  Modes of Intervention:  Activity  Summary of Progress/Problems:  Tomasita Morrow 03/26/2016, 9:28 PM

## 2016-03-26 NOTE — Progress Notes (Signed)
Patient ID: Karina Anderson, female   DOB: December 06, 1970, 45 y.o.   MRN: [id number]   CSW met with pt after group on today. Pt stated "we" intentionally are keeping her in the hospital for her mother's birthday on tomorrow. CSW stated to pt that she is still in the hospital for her safety and to ensure pt has a safe discharge plan. Pt stated she did not understand why she was still here. CSW explained to pt that pt would need to work with her doctor and CSW to determine discharge plan which up until now, pt has been defensive when talking about aftercare and discharge planning. Pt stated she needs to improve her listening skills. Pt stated she wanted to return to her home with no lights, furniture, or food. CSW reviewed with pt the plan that pt discussed with CPS worker Karina Anderson on 03/25/16. Pt stated she would be willing to live with Mr. Karina Anderson until her housing is situated. Pt will be meeting with CPS worker, Mr. Karina Anderson, and CSW to discuss living arrangement with pt and kids.CSW informed pt that the CSW role is to help pt and connect her with the resources and services that she needs. Pt stated understanding and had no further questions for CSW at this time.   Koi Yarbro G. Claybon Jabs MSW, [place] 03/26/2016 4:08 PM

## 2016-03-26 NOTE — [facility] Group Notes (Signed)
[facility] LCSW Group Therapy  03/26/2016 3:25 PM  Type of Therapy:  Group Therapy  Participation Level:  Active  Participation Quality:  Sharing  Affect:  Resistant  Cognitive:  Disorganized  Insight:  Defensive  Engagement in Therapy:  Defensive, Engaged and Off Topic  Modes of Intervention:  Activity, Discussion, Education, Reality Testing and Support  Summary of Progress/Problems: Self care: Patients discussed self care and how it impacts them. Patients were asked to define self care in their own words. They discussed what aspects in their lives has influenced their self care. Patients also discussed self care in the areas of self regulation/control, hygiene/appearance, sleep/relaxation, healthy leisure, healthy eating habits, exercise, inner peace/spirituality, self improvement, sobriety, and health management. They were challenged to identify changes that are needed in order to improve self care. Pt was sharing during group but needed constant redirection to let other speak without constant interruption from her. Pt was redirectable but would continue to interrupt others. Pt monopolized the group and would not let others speak towards the end of group. Pt became disorganized and start talking about different stories unrelated to group discussion. CSW attempted to redirect pt however pt continued to talk over CSW. CSW told pt that if she needed to discuss anything further with CSW she could do so after group. Pt stated understanding of this. CSW spoke with pt after group. Please see following progress note.   Kwabena Strutz G. Garnette Czech MSW, LCSWA 03/26/2016, 3:32 PM

## 2016-03-26 NOTE — Progress Notes (Signed)
Pt has been pleasant and cooperative. Pt talked about events that lead to hospitalization. Pt states she is looking forward to being discharged Mon. Or Tue.. Pt denies SI and A/V hallucinations. Pt has been active on the unit.

## 2016-03-26 NOTE — Plan of Care (Signed)
Problem: Coping: Goal: Ability to verbalize feelings will improve Outcome: Progressing Patient is verbalizing feelings.

## 2016-03-26 NOTE — Progress Notes (Signed)
D: Pt denies SI/HI/AVH. Pt is pleasant and cooperative, less irritable. Pt appears less anxious and she is interacting with peers and staff appropriately. Patient's  thoughts are less disorganized, no bizarre behavior or loud out A: Pt was offered support and encouragement. Pt was given scheduled medications. Pt was encouraged to attend groups. Q 15 minute checks were done for safety.  R:Pt attends groups and interacts well with peers and staff. Pt is taking medication. Pt has no complaints.Pt receptive to treatment and safety maintained on unit.

## 2016-03-27 NOTE — [facility] Group Notes (Signed)
[facility] LCSW Group Therapy  03/27/2016 3:07 PM  Type of Therapy:  Group Therapy  Participation Level:  Active  Participation Quality:  Appropriate, Sharing and Supportive  Affect:  Appropriate  Cognitive:  Alert  Insight:  Developing/Improving and Improving  Engagement in Therapy:  Engaged  Modes of Intervention:  Activity, Discussion, Education, Reality Testing and Support  Summary of Progress/Problems: Self-responsibility/accountability- Patients discussed self responsibility/accountability  and how it impacts them. Patients were asked to define these concepts in their own words. They discussed taking ownership of their actions and the challenges they have with taking accountability for their self. CSW introduced "YOU vs. I" statements and explained how "I" statements identifies how they feel about the situation without being threatening or offensive to others and could help improve their communication with others. Examples were provided of each. They were challenged to identify changes that are needed in order to improve self responsibility/accountability. CSW provided inspirational quotes that focused on the patients taking accountability for actions both good and bad. Patients were asked to read the quotes out loud and share their thoughts with the group about their quote. Pt was more calm than yesterday during group. Pt did not disrupt her peers and was supportive to peers who shared difficult experiences. Pt was willing to read her quote out loud and share her thoughts on the quote. Pt's thoughts were more clear and was able to have insight on not blaming othersfor her mistakes and decisions. CSW provided support and encouragement to pt to continue making progress towards her goals.   Garvis Downum G. Garnette Czech MSW, LCSWA 03/27/2016, 3:12 PM

## 2016-03-27 NOTE — Plan of Care (Signed)
Problem: Activity: Goal: Interest or engagement in leisure activities will improve Outcome: Progressing Pt socialized in dayroom and attended evening wrap up group.

## 2016-03-27 NOTE — Progress Notes (Signed)
Pt denies any SI/HI/AVH. Attended evening group. Pt guarded but pleasant with Clinical research associate during interaction. Appropriate with staff and peers. Medication compliant. Visible socializing in milieu. Denies pain and voices no additional concerns at this time. Will continue to monitor.

## 2016-03-27 NOTE — Plan of Care (Signed)
Problem: Coping: Goal: Ability to verbalize frustrations and anger appropriately will improve Pt has been less verbal this period

## 2016-03-27 NOTE — [facility] Group Notes (Signed)
[facility] Group Notes:  (Nursing/MHT/Case Management/Adjunct)  Date:  03/27/2016  Time:  10:34 PM  Type of Therapy:  Psychoeducational Skills  Participation Level:  Active  Participation Quality:  Appropriate, Attentive, Sharing and Supportive  Affect:  Appropriate  Cognitive:  Appropriate  Insight:  Appropriate  Engagement in Group:  Engaged and Supportive  Modes of Intervention:  Discussion, Socialization and Support  Summary of Progress/Problems:  Karina Anderson 03/27/2016, 10:34 PM

## 2016-03-27 NOTE — Progress Notes (Signed)
[facility] MD Progress Note  03/27/2016 11:02 AM [place]  MRN:  [id number]  Subjective:    Karina Anderson is a 45 year old female with history of bipolar disorder admitted in a manic psychotic, disorganized episode. During current episode she lost her housing and boyfriend. CPS is involved as her kids became homeless when she came to the hospital. They are now in the family member. The patient is a low to get her kids back if she finding adequate living arrangement.  7/29 Patient was focus on having the SW here help her to get furniture for her house.  She says she feels the SW here does not want to help her.  Other than that she denies depression, anxiety, problems with appetite, energy or concentration.  Denies SI, HI or hallucinations.  Denies SE from meds or physical complaints.  7/30 today the patient says she is doing well. She denies problems with mood, appetite, energy, and sleep or concentration, denies suicidality, homicidality or having auditory or visual hallucinations. She denies side effects from medications. She denies any physical complaints. Says that HER-2 children 16 and 12 are coming to visit her today. She says today's is sad today because is her mother's birthday who passed away a few years ago. Patient says that she was planning on not leaving her room as she wanted to be on her own.    Principal Problem: Bipolar affective disorder, manic, severe, with psychotic behavior ([place]) Diagnosis:   Patient Active Problem List   Diagnosis Date Noted  . Tobacco use disorder [F17.200] 03/13/2016  . Cannabis use disorder, moderate, dependence ([place]) [F12.20] 03/13/2016  . Bipolar affective disorder, manic, severe, with psychotic behavior ([place]) [F31.2] 03/12/2016  . Seizures ([place]) [R56.9] 03/11/2016  . Involuntary commitment [Z04.6] 03/11/2016   Total Time spent with patient: 20 minutes  Past Psychiatric History: Bipolar disorder.  Past Medical History:  Past Medical History:   Diagnosis Date  . Diabetes mellitus without complication ([place])   . Seizures ([place])    History reviewed. No pertinent surgical history. Family History: History reviewed. No pertinent family history. Family Psychiatric  History: See H&P. Social History:  History  Alcohol Use No     History  Drug Use No    Social History   Social History  . Marital status: Single    Spouse name: N/A  . Number of children: N/A  . Years of education: N/A   Social History Main Topics  . Smoking status: Current Every Day Smoker    Packs/day: 0.50    Types: Cigarettes  . Smokeless tobacco: None  . Alcohol use No  . Drug use: No  . Sexual activity: Not Asked   Other Topics Concern  . None   Social History Narrative  . None   Additional Social History:                         Sleep: Fair  Appetite:  Fair  Current Medications: Current Facility-Administered Medications  Medication Dose Route Frequency Provider Last Rate Last Dose  . acetaminophen (TYLENOL) tablet 650 mg  650 mg Oral Q6H PRN Gonzella Lex, MD   650 mg at 03/16/16 2035  . alum & mag hydroxide-simeth (MAALOX/MYLANTA) 200-200-20 MG/5ML suspension 30 mL  30 mL Oral Q4H PRN Gonzella Lex, MD      . clonazePAM (KLONOPIN) tablet 1 mg  1 mg Oral TID PRN Clovis Fredrickson, MD   1 mg at 03/15/16 1451  .  feeding supplement (ENSURE ENLIVE) (ENSURE ENLIVE) liquid 237 mL  237 mL Oral BID BM Jolanta B Pucilowska, MD   237 mL at 03/27/16 0906  . haloperidol (HALDOL) tablet 5 mg  5 mg Oral Q6H PRN Clovis Fredrickson, MD   5 mg at 03/18/16 2059  . hydrOXYzine (ATARAX/VISTARIL) tablet 50 mg  50 mg Oral Q6H PRN Clovis Fredrickson, MD   50 mg at 03/18/16 2059  . magnesium hydroxide (MILK OF MAGNESIA) suspension 30 mL  30 mL Oral Daily PRN Gonzella Lex, MD   30 mL at 03/23/16 2200  . phenytoin (DILANTIN) ER capsule 100 mg  100 mg Oral TID Gonzella Lex, MD   100 mg at 03/27/16 0907  . QUEtiapine (SEROQUEL) tablet 200 mg  200  mg Oral TID PRN Jolanta B Pucilowska, MD      . QUEtiapine (SEROQUEL) tablet 800 mg  800 mg Oral QHS Jolanta B Pucilowska, MD   800 mg at 03/26/16 2146    Lab Results: No results found for this or any previous visit (from the past 48 hour(s)).  Blood Alcohol level:  Lab Results  Component Value Date   ETH <5 78/24/2353    Metabolic Disorder Labs: Lab Results  Component Value Date   HGBA1C 4.9 03/12/2016   Lab Results  Component Value Date   PROLACTIN 50.4 (H) 03/12/2016   Lab Results  Component Value Date   CHOL 174 03/12/2016   TRIG 75 03/12/2016   HDL 37 (L) 03/12/2016   CHOLHDL 4.7 03/12/2016   VLDL 15 03/12/2016   LDLCALC 122 (H) 03/12/2016    Physical Findings: AIMS: Facial and Oral Movements Muscles of Facial Expression: None, normal Lips and Perioral Area: None, normal Jaw: None, normal Tongue: None, normal,Extremity Movements Upper (arms, wrists, hands, fingers): None, normal Lower (legs, knees, ankles, toes): None, normal, Trunk Movements Neck, shoulders, hips: None, normal, Overall Severity Severity of abnormal movements (highest score from questions above): None, normal Incapacitation due to abnormal movements: None, normal Patient's awareness of abnormal movements (rate only patient's report): No Awareness, Dental Status Current problems with teeth and/or dentures?: No Does patient usually wear dentures?: No  CIWA:    COWS:     Musculoskeletal: Strength & Muscle Tone: within normal limits Gait & Station: normal Patient leans: N/A  Psychiatric Specialty Exam: Physical Exam  Nursing note and vitals reviewed. Constitutional: She is oriented to person, place, and time. She appears well-developed and well-nourished.  HENT:  Head: Normocephalic and atraumatic.  Eyes: EOM are normal.  Neck: Normal range of motion.  Respiratory: Effort normal.  Neurological: She is alert and oriented to person, place, and time.    Review of Systems  Constitutional:  Negative.   HENT: Negative.   Eyes: Negative.   Respiratory: Negative.   Cardiovascular: Negative.   Gastrointestinal: Negative.   Genitourinary: Negative.   Musculoskeletal: Negative.   Skin: Negative.   Neurological: Negative.   Endo/Heme/Allergies: Negative.   Psychiatric/Behavioral: Negative.   All other systems reviewed and are negative.   Blood pressure 106/81, pulse 87, temperature 98 F (36.7 C), temperature source Oral, resp. rate 18, height _0  (1.626 m), weight 56.2 kg (124 lb), last menstrual period 03/11/2016, SpO2 99 %.Body mass index is 21.28 kg/m.  General Appearance: Casual  Eye Contact:  Good  Speech:  Pressured  Volume:  Increased  Mood:  Angry, Anxious, Dysphoric and Irritable  Affect:  Congruent  Thought Process:  Disorganized  Orientation:  Full (Time, Place,  and Person)  Thought Content:  Delusions and Paranoid Ideation  Suicidal Thoughts:  No  Homicidal Thoughts:  No  Memory:  Immediate;   Fair Recent;   Fair Remote;   Fair  Judgement:  Poor  Insight:  Lacking  Psychomotor Activity:  Increased  Concentration:  Concentration: Fair and Attention Span: Fair  Recall:  [employer] of Knowledge:  Fair  Language:  Fair  Akathisia:  No  Handed:  Right  AIMS (if indicated):     Assets:  Communication Skills Desire for Improvement Financial Resources/Insurance Physical Health Resilience Social Support  ADL's:  Intact  Cognition:  WNL  Sleep:  Number of Hours: 6.3     Treatment Plan Summary: Daily contact with patient to assess and evaluate symptoms and progress in treatment and Medication management   Karina Anderson is a 45 year old female with a history of psychosis and mood instability admitted for worsening of symptoms and bizarre,dangerous behavior.   Per nursing staff there has been no events overnight. She continues to comply with medications and has not displayed aggression or agitation. I will not make any changes of her medications  today.  1. Agitation. Seroquel as needed is available.  2. Mood. The patient was restarted on Seroquel for mood stabilization and psychosis.   3. Insomnia. Resolved with Seroquel.  4. Seizures. The patient has been treated with Dilantin. Dilantin level was low at 5.3.   5. Smoking. She refuses a patch and asks for a cigarette.  6. Substance abuse. The patient minimizes her problems and declines treatment.  7. Metabolic syndrome monitoring. Lipid profile, TSH, hemoglobin A1c are normal. Prolactin 50.4.  8. Social. CPS referral was made. Another meeting with [place] worker completed. CPS identified a friends who could provide housing for the patient and her 2 children. The patient still does not appreciate the severity of her situation.  9. Trichomonas infection. She completed Metronizadol course.  10. Disposition. To be established. She might be discharged with [facility]gie with CPS approval no sooner then beginning of August.  Hildred Priest, MD 03/27/2016, 11:02 AM

## 2016-03-28 MED ORDER — QUETIAPINE FUMARATE 400 MG PO TABS: 800.0000 mg | ORAL_TABLET | Freq: Every day | ORAL | 1 refills | Status: DC

## 2016-03-28 MED ORDER — HYDROXYZINE HCL 50 MG PO TABS: 50.0000 mg | ORAL_TABLET | Freq: Four times a day (QID) | ORAL | 1 refills | Status: DC | PRN

## 2016-03-28 MED ORDER — PHENYTOIN SODIUM EXTENDED 100 MG PO CAPS: 100.0000 mg | ORAL_CAPSULE | Freq: Three times a day (TID) | ORAL | 1 refills | Status: DC

## 2016-03-28 NOTE — Progress Notes (Signed)
Pt denies SI/HI/AVH. Stated her daughter got upset with her during her visit tonight and she felt like the person who was watching her was "trying to turn my kids against me". States she is looking forward to discharge on Monday or Tuesday. Medication compliant and attended evening group.  Voices no additional concerns at this time. Safety maintained. Will continue to monitor.

## 2016-03-28 NOTE — Progress Notes (Signed)
D: Pleasant and cooperative with care. Depressed, sad, flat affect. Denies all.  A: Encouragement and support offered.  R:Pt receptive and remains safe on unit with q 15 min checks.

## 2016-03-28 NOTE — Progress Notes (Signed)
D:Patient has been pleasant in the milieu. Denies SI/HI/AVH. Attended group. Denies pain.  A: Medication given with education. Encouragement provided.  R: Patient was compliant with medication. She has remained calm and cooperative. Safety maintained with 15 min checks

## 2016-03-28 NOTE — Plan of Care (Signed)
Problem: Safety: Goal: Ability to remain free from injury will improve Outcome: Progressing Patient has remained free from injury during this shift.   

## 2016-03-28 NOTE — [facility] Group Notes (Signed)
[facility] Group Notes:  (Nursing/MHT/Case Management/Adjunct)  Date:  03/28/2016  Time:  4:11 PM  Type of Therapy:  Psychoeducational Skills  Participation Level:  Active  Participation Quality:  Appropriate  Affect:  Appropriate  Cognitive:  Appropriate  Insight:  Appropriate  Engagement in Group:  Engaged and Supportive  Modes of Intervention:  Discussion, Socialization and Support  Summary of Progress/Problems:  Lynelle Smoke Manjinder Breau 03/28/2016, 4:11 PM

## 2016-03-28 NOTE — [facility] Group Notes (Signed)
[facility] LCSW Group Therapy   03/28/2016 1pm Type of Therapy: Group Therapy   Participation Level: Active   Participation Quality: Attentive, Sharing and Supportive   Affect: Depressed and Flat   Cognitive: Alert and Oriented   Insight: Developing/Improving and Engaged   Engagement in Therapy: Developing/Improving and Engaged   Modes of Intervention: Clarification, Confrontation, Discussion, Education, Exploration,  Limit-setting, Orientation, Problem-solving, Rapport Building, Dance movement psychotherapist, Socialization and Support   Summary of Progress/Problems: Pt identified obstacles faced currently and processed barriers involved in overcoming these obstacles. Pt identified steps necessary for overcoming these obstacles and explored motivation (internal and external) for facing these difficulties head on. Pt further identified one area of concern in their lives and chose a goal to focus on for today. Pt at times dominated group sharing and did not stay focused on the topic when sharing, but was easily redirectable.  Pt identified that the pt's primary goal was to decide her future for her and her children.  Pt identified a barrier to that goal was conflict with others.  Pt shared that the pt intended to talk about important issues and practice meditation.  Pt was polite and cooperative with the CSW and other group members and focused and attentive to the topics discussed and the sharing of others.   Dorothe Pea. Kamalani Mastro, LCSWA, LCAS

## 2016-03-28 NOTE — Plan of Care (Signed)
Problem: Nutrition: Goal: Adequate nutrition will be maintained Outcome: Progressing Patient eating meals and drinking ensure

## 2016-03-28 NOTE — Progress Notes (Signed)
[facility] MD Progress Note  03/28/2016 11:34 AM Kristian Hildebrant  MRN:  [id number]  Subjective:  Ms. Dawkins reports no problems today. She is anxiously awaiting discharge tomorrow following a meeting with CPS worker and her new landlord Reggie. Unfortunately the patient is also telling me that she is in the process of decorating her rented house from which she was evicted. She denies any symptoms of depression, anxiety, or psychosis. She is not suicidal or homicidal. She's been talking to her children over the phone. There are no somatic complaints. She accepts Seroquel and tolerates it well. She will follow up with [facility] for medication management and therapy.  Principal Problem: Bipolar affective disorder, manic, severe, with psychotic behavior ([facility]) Diagnosis:   Patient Active Problem List   Diagnosis Date Noted  . Cannabis use disorder, moderate, dependence ([facility]) [F12.20] 03/13/2016    Priority: Medium  . Involuntary commitment [Z04.6] 03/11/2016    Priority: Medium  . Tobacco use disorder [F17.200] 03/13/2016  . Bipolar affective disorder, manic, severe, with psychotic behavior ([facility]) [F31.2] 03/12/2016  . Seizures ([facility]) [R56.9] 03/11/2016   Total Time spent with patient: 20 minutes  Past Psychiatric History: Bipolar disorder.  Past Medical History:  Past Medical History:  Diagnosis Date  . Diabetes mellitus without complication ([facility])   . Seizures ([facility])    History reviewed. No pertinent surgical history. Family History: History reviewed. No pertinent family history. Family Psychiatric  History: None reported. Social History:  History  Alcohol Use No     History  Drug Use No    Social History   Social History  . Marital status: Single    Spouse name: N/A  . Number of children: N/A  . Years of education: N/A   Social History Main Topics  . Smoking status: Current Every Day Smoker    Packs/day: 0.50    Types: Cigarettes  . Smokeless tobacco: None  . Alcohol use No  . Drug use:  No  . Sexual activity: Not Asked   Other Topics Concern  . None   Social History Narrative  . None   Additional Social History:                         Sleep: Fair  Appetite:  Fair  Current Medications: Current Facility-Administered Medications  Medication Dose Route Frequency Provider Last Rate Last Dose  . acetaminophen (TYLENOL) tablet 650 mg  650 mg Oral Q6H PRN Audery Amel, MD   650 mg at 03/16/16 2035  . alum & mag hydroxide-simeth (MAALOX/MYLANTA) 200-200-20 MG/5ML suspension 30 mL  30 mL Oral Q4H PRN Audery Amel, MD      . clonazePAM (KLONOPIN) tablet 1 mg  1 mg Oral TID PRN Shari Prows, MD   1 mg at 03/15/16 1451  . feeding supplement (ENSURE ENLIVE) (ENSURE ENLIVE) liquid 237 mL  237 mL Oral BID BM Camdon Saetern B Tida Saner, MD   237 mL at 03/28/16 1000  . haloperidol (HALDOL) tablet 5 mg  5 mg Oral Q6H PRN Shari Prows, MD   5 mg at 03/18/16 2059  . hydrOXYzine (ATARAX/VISTARIL) tablet 50 mg  50 mg Oral Q6H PRN Shari Prows, MD   50 mg at 03/18/16 2059  . magnesium hydroxide (MILK OF MAGNESIA) suspension 30 mL  30 mL Oral Daily PRN Audery Amel, MD   30 mL at 03/23/16 2200  . phenytoin (DILANTIN) ER capsule 100 mg  100 mg Oral TID Jonny Ruiz  T Clapacs, MD   100 mg at 03/28/16 0847  . QUEtiapine (SEROQUEL) tablet 200 mg  200 mg Oral TID PRN Shari Prows, MD      . QUEtiapine (SEROQUEL) tablet 800 mg  800 mg Oral QHS Charlena Haub B Gautham Hewins, MD   800 mg at 03/27/16 2155    Lab Results: No results found for this or any previous visit (from the past 48 hour(s)).  Blood Alcohol level:  Lab Results  Component Value Date   ETH <5 03/11/2016    Metabolic Disorder Labs: Lab Results  Component Value Date   HGBA1C 4.9 03/12/2016   Lab Results  Component Value Date   PROLACTIN 50.4 (H) 03/12/2016   Lab Results  Component Value Date   CHOL 174 03/12/2016   TRIG 75 03/12/2016   HDL 37 (L) 03/12/2016   CHOLHDL 4.7 03/12/2016    VLDL 15 03/12/2016   LDLCALC 122 (H) 03/12/2016    Physical Findings: AIMS: Facial and Oral Movements Muscles of Facial Expression: None, normal Lips and Perioral Area: None, normal Jaw: None, normal Tongue: None, normal,Extremity Movements Upper (arms, wrists, hands, fingers): None, normal Lower (legs, knees, ankles, toes): None, normal, Trunk Movements Neck, shoulders, hips: None, normal, Overall Severity Severity of abnormal movements (highest score from questions above): None, normal Incapacitation due to abnormal movements: None, normal Patient's awareness of abnormal movements (rate only patient's report): No Awareness, Dental Status Current problems with teeth and/or dentures?: No Does patient usually wear dentures?: No  CIWA:    COWS:     Musculoskeletal: Strength & Muscle Tone: within normal limits Gait & Station: normal Patient leans: N/A  Psychiatric Specialty Exam: Physical Exam  Nursing note and vitals reviewed.   Review of Systems  Psychiatric/Behavioral: The patient is nervous/anxious.   All other systems reviewed and are negative.   Blood pressure 101/76, pulse 87, temperature 98 F (36.7 C), temperature source Oral, resp. rate 18, height  (1.626 m), weight 56.2 kg (124 lb), last menstrual period 03/11/2016, SpO2 99 %.Body mass index is 21.28 kg/m.  General Appearance: Casual  Eye Contact:  Good  Speech:  Clear and Coherent  Volume:  Normal  Mood:  Anxious  Affect:  Appropriate  Thought Process:  Goal Directed  Orientation:  Full (Time, Place, and Person)  Thought Content:  WDL  Suicidal Thoughts:  No  Homicidal Thoughts:  No  Memory:  Immediate;   Fair Recent;   Fair Remote;   Fair  Judgement:  Impaired  Insight:  Shallow  Psychomotor Activity:  Normal  Concentration:  Concentration: Fair and Attention Span: Fair  Recall:  [employer] of Knowledge:  Fair  Language:  Fair  Akathisia:  No  Handed:  Right  AIMS (if indicated):     Assets:   Communication Skills Desire for Improvement Financial Resources/Insurance Physical Health Resilience Social Support  ADL's:  Intact  Cognition:  WNL  Sleep:  Number of Hours: 7     Treatment Plan Summary: Daily contact with patient to assess and evaluate symptoms and progress in treatment and Medication management   Ms. Glaze is a 45 year old female with a history of psychosis and mood instability admitted for worsening of symptoms and bizarre,dangerous behavior.   Per nursing staff there has been no events overnight. She continues to comply with medications and has not displayed aggression or agitation. I will not make any changes of her medications today.  1. Agitation. Seroquel as needed is available.  2. Mood. The  patient was restarted on Seroquel for mood stabilization and psychosis.   3. Insomnia. Resolved with Seroquel.  4. Seizures. The patient has been treated with Dilantin. Dilantin level was low at 5.3.   5. Smoking. She refuses a patch and asks for a cigarette.  6. Substance abuse. The patient minimizes her problems and declines treatment.  7. Metabolic syndrome monitoring. Lipid profile, TSH, hemoglobin A1c are normal. Prolactin 50.4.  8. Social. CPS referral was made. Another meeting with CPS worker completed. CPS identified a friend who could provide housing for the patient and her 2 children.  9. Trichomonas infection. She completed Metronizadol course.  10. Disposition. To be established. She will be discharged tomorrow with Reggie with CPS approval. She will follow up with [facility].    Kristine Linea, MD 03/28/2016, 11:34 AM

## 2016-03-28 NOTE — [facility] Group Notes (Signed)
[facility] Group Notes:  (Nursing/MHT/Case Management/Adjunct)  Date:  03/28/2016  Time:  9:11 PM  Type of Therapy:  Group Therapy  Participation Level:  Active  Participation Quality:  Appropriate  Affect:  Appropriate  Cognitive:  Appropriate  Insight:  Appropriate  Engagement in Group:  Engaged  Modes of Intervention:  Discussion  Summary of Progress/Problems:  Karina Anderson 03/28/2016, 9:11 PM

## 2016-03-29 MED ORDER — PHENYTOIN SODIUM EXTENDED 100 MG PO CAPS: 100.0000 mg | ORAL_CAPSULE | Freq: Three times a day (TID) | ORAL | 1 refills | Status: DC

## 2016-03-29 MED ORDER — QUETIAPINE FUMARATE 400 MG PO TABS: 800.0000 mg | ORAL_TABLET | Freq: Every day | ORAL | 1 refills | Status: DC

## 2016-03-29 MED ORDER — HYDROXYZINE HCL 50 MG PO TABS: 50.0000 mg | ORAL_TABLET | Freq: Four times a day (QID) | ORAL | 1 refills | Status: DC | PRN

## 2016-03-29 NOTE — Progress Notes (Signed)
Patient with appropriate affect, cooperative behavior with meals, meds and plan of care. No SI/HI at this time. Minimal interaction with peers. Verbalizes needs appropriately with staff. Patient working with SW rt today's discharge and to discharge when discharge plan and transportation. Safety maintained.One on one with patient to discuss coping skills rt managing stressors. Patient verbalizes understanding of use of coping skills and of importance of taking meds and attending follow up care.

## 2016-03-29 NOTE — Progress Notes (Signed)
  [facility] Adult Case Management Discharge Plan :  Will you be returning to the same living situation after discharge:  No. At discharge, do you have transportation home?: Yes,    Do you have the ability to pay for your medications: Yes,     Release of information consent forms completed and in the chart;  Patient's signature needed at discharge.  Patient to Follow up at: Follow-up Information    [facility]. Go on 04/06/2016.   Why:  7:00 am.  Meet Peer Support Specialist Unk Pinto at [facility].  Keep in touch with him at [phone]  for assistance with initial visit to begin medication management and appropriate therapy or community support team. Contact information: [address] [place] [address] [phone] FAX          Next level of care provider has access to [facility] Link:no  Safety Planning and Suicide Prevention discussed: Yes,     Have you used any form of tobacco in the last 30 days? (Cigarettes, Smokeless Tobacco, Cigars, and/or Pipes): No  Has patient been referred to the Quitline?: N/A patient is not a smoker  Patient has been referred for addiction treatment: Yes  Sakina Briones, Cleda Daub, MSW, LCSW 03/29/2016, 4:34 PM

## 2016-03-29 NOTE — Discharge Summary (Signed)
Physician Discharge Summary Note  Patient:  Karina Anderson is an 45 y.o., female MRN:  [id number] DOB:  [birth year] Patient phone:  [phone] (home)  Patient address:   [address]  [place] [address],  Total Time spent with patient: 30 minutes  Date of Admission:  03/11/2016 Date of Discharge: 03/29/2016  Reason for Admission:  Psychotic break.  History of Present Illness: 45 year old woman presented to the emergency room after being picked up by law enforcement in bizarre behavior in public. She was throwing a microwave oven out into the [place]. She appeared to be disorganized and bizarre in her thinking. On evaluation in the emergency room she continued to be disorganized with labile mood and difficulty explaining her recent situation. Denied suicidal ideation. She was aggressive at times when she first presented. No clear specific known stress. Minimizes the use of drugs and alcohol saying she hasn't had any marijuana recently. Not currently getting any outpatient treatment  Associated Signs/Symptoms: Depression Symptoms:  anhedonia, psychomotor agitation, difficulty concentrating, (Hypo) Manic Symptoms:  Distractibility, Impulsivity, Irritable Mood, Anxiety Symptoms:  Nonspecific Psychotic Symptoms:  Ideas of Reference, Paranoia, PTSD Symptoms: Negative  Past Psychiatric History: Patient will admit to having had mental health treatment years ago as a youngster. Some hint that she may have had psychiatric treatment intermittently. Patient has not apparently been in any outpatient treatment anytime recently.  Family psychistric family. None reported.  Social history. The patient was apparently evicted from the house she rented recently for herself and her two children. CPS became involved and the children are sfe with a family member. The patient and one of her daughters receive disability. The patient has medicaid.  Principal Problem: Bipolar affective disorder, manic, severe, with  psychotic behavior [facility]) Discharge Diagnoses: Patient Active Problem List   Diagnosis Date Noted  . Cannabis use disorder, moderate, dependence (HCC) [F12.20] 03/13/2016    Priority: Medium  . Involuntary commitment [Z04.6] 03/11/2016    Priority: Medium  . Tobacco use disorder [F17.200] 03/13/2016  . Bipolar affective disorder, manic, severe, with psychotic behavior (HCC) [F31.2] 03/12/2016  . Seizures (HCC) [R56.9] 03/11/2016    Past Medical History:  Past Medical History:  Diagnosis Date  . Diabetes mellitus without complication (HCC)   . Seizures (HCC)    History reviewed. No pertinent surgical history. Family History: History reviewed. No pertinent family history.  Social History:  History  Alcohol Use No     History  Drug Use No    Social History   Social History  . Marital status: Single    Spouse name: N/A  . Number of children: N/A  . Years of education: N/A   Social History Main Topics  . Smoking status: Current Every Day Smoker    Packs/day: 0.50    Types: Cigarettes  . Smokeless tobacco: None  . Alcohol use No  . Drug use: No  . Sexual activity: Not Asked   Other Topics Concern  . None   Social History Narrative  . None    Hospital Course:    Ms. Mcdougall is a 45 year old female with a history of psychosis and mood instability admitted for worsening of symptoms and bizarre,dangerous behavior.   1. Agitation. Resolved.   2. Mood. The patient was restarted on Seroquel for mood stabilization and psychosis.   3. Insomnia. Resolved with Seroquel.  4. Seizures. The patient was restarted on Dilantin. Dilantin level was initially low at 5.3.   5. Smoking. She refuses a patch and asks for a  cigarette.  6. Substance abuse. The patient minimizes her problems and declines treatment.  7. Metabolic syndrome monitoring. Lipid profile, TSH, hemoglobin A1c are normal. Prolactin 50.4.  8. Social. CPS referral was made. Several meetings with CPS  worker were completed. CPS identified a friend who could provide housing for the patient and her 2 children.  9. Trichomonas infection. She completed Metronizadol course.  10. Disposition. She was discharged with a friend, Reggie, with CPS approval. She will follow up with [facility].   Physical Findings: AIMS: Facial and Oral Movements Muscles of Facial Expression: None, normal Lips and Perioral Area: None, normal Jaw: None, normal Tongue: None, normal,Extremity Movements Upper (arms, wrists, hands, fingers): None, normal Lower (legs, knees, ankles, toes): None, normal, Trunk Movements Neck, shoulders, hips: None, normal, Overall Severity Severity of abnormal movements (highest score from questions above): None, normal Incapacitation due to abnormal movements: None, normal Patient's awareness of abnormal movements (rate only patient's report): No Awareness, Dental Status Current problems with teeth and/or dentures?: No Does patient usually wear dentures?: No  CIWA:    COWS:     Musculoskeletal: Strength & Muscle Tone: within normal limits Gait & Station: normal Patient leans: N/A  Psychiatric Specialty Exam: Physical Exam  Nursing note and vitals reviewed.   Review of Systems  Psychiatric/Behavioral: The patient is nervous/anxious.     Blood pressure 97/69, pulse 89, temperature 98.1 F (36.7 C), temperature source Oral, resp. rate 18, height 5\' 4"  (1.626 m), weight 56.2 kg (124 lb), last menstrual period 03/11/2016, SpO2 99 %.Body mass index is 21.28 kg/m.  See SRA.                                                  Sleep:  Number of Hours: 6     Have you used any form of tobacco in the last 30 days? (Cigarettes, Smokeless Tobacco, Cigars, and/or Pipes): No  Has this patient used any form of tobacco in the last 30 days? (Cigarettes, Smokeless Tobacco, Cigars, and/or Pipes) Yes, Yes, A prescription for an FDA-approved tobacco cessation medication was  offered at discharge and the patient refused  Blood Alcohol level:  Lab Results  Component Value Date   [facility] <5 03/11/2016    Metabolic Disorder Labs:  Lab Results  Component Value Date   HGBA1C 4.9 03/12/2016   Lab Results  Component Value Date   PROLACTIN 50.4 (H) 03/12/2016   Lab Results  Component Value Date   CHOL 174 03/12/2016   TRIG 75 03/12/2016   HDL 37 (L) 03/12/2016   CHOLHDL 4.7 03/12/2016   VLDL 15 03/12/2016   LDLCALC 122 (H) 03/12/2016    See Psychiatric Specialty Exam and Suicide Risk Assessment completed by Attending Physician prior to discharge.  Discharge destination:  Home  Is patient on multiple antipsychotic therapies at discharge:  No   Has Patient had three or more failed trials of antipsychotic monotherapy by history:  No  Recommended Plan for Multiple Antipsychotic Therapies: NA  Discharge Instructions    Diet - low sodium heart healthy    Complete by:  As directed   Increase activity slowly    Complete by:  As directed       Medication List    TAKE these medications     Indication  hydrOXYzine 50 MG tablet Commonly known as:  ATARAX/VISTARIL Take 1  tablet (50 mg total) by mouth every 6 (six) hours as needed for anxiety.  Indication:  Anxiety Neurosis   phenytoin 100 MG ER capsule Commonly known as:  DILANTIN Take 1 capsule (100 mg total) by mouth 3 (three) times daily.  Indication:  Seizure   QUEtiapine 400 MG tablet Commonly known as:  SEROQUEL Take 2 tablets (800 mg total) by mouth at bedtime.  Indication:  Manic Phase of Manic-Depression        Follow-up recommendations:  Activity:  as tolerated. Diet:  low sodium heart healthy. Other:  keep follow up appointment.  Comments:    Signed: Kristine Linea, MD 03/29/2016, 9:06 AM

## 2016-03-29 NOTE — Progress Notes (Signed)
Recreation Therapy Notes  Date: 08.01.17 Time: 9:30 am Location: Craft Room  Group Topic: Self-expression  Goal Area(s) Addresses:  Patient will identify one color per emotion listed on wheel. Patient will verbalize benefit of using art as a means of self-expression. Patient will verbalize one emotion experienced during session. Patient will be educated on other forms of self-expression.  Behavioral Response: Attentive, Interactive  Intervention: Emotion Wheel  Activity: Patients were given an Emotion Wheel worksheet and instructed to pick a color for each emotion that was listed on the wheel.  Education:LRT educated patients on other forms of self-expression.  Education Outcome: Acknowledges education/In group clarification offered  Clinical Observations/Feedback: Patient completed activity by picking a color for each emotion. Patient contributed to group discussion by stating what colors she picked for each emotion and why, how it felt to see her emotions in color, and what emotions she felt while she was participating in the activity. Patient talked about her daughters and how she is training them to help them and how one has more marks than the other.  Jacquelynn Cree, LRT/CTRS 03/29/2016 10:32 AM

## 2016-03-29 NOTE — Tx Team (Signed)
Interdisciplinary Treatment Plan Update (Adult)  Date:  03/29/2016 Time Reviewed:  4:35 PM  Review of initial/current patient goals per problem list:   1.? Goal(s): Patient will participate in aftercare plan ? Met: YES ? Target date: at discharge  ? As evidenced by: Patient will participate within aftercare plan AEB aftercare provider and housing plan at discharge being identified.  ? 2.? Goal (s): Patient will exhibit decreased depressive symptoms and suicidal ideations. ? Met: YES ? ?Target date: at discharge  ? As evidenced by: Patient will utilize self rating of depression at 3 or below and demonstrate decreased signs of depression or be deemed stable for discharge by MD.  ? 3.? Goal(s): Patient will demonstrate decreased signs and symptoms of anxiety. ? Met: YES  ? Target date: at discharge  ? As evidenced by: Patient will utilize self rating of anxiety at 3 or below and demonstrated decreased signs of anxiety, or be deemed stable for discharge by MD   4.? Goal (s): Patient will demonstrate decreased symptoms of Mania. ? Met:YES ? ?Target date: at discharge  ? As evidenced by: Patient will not endorse signs of mania or be deemed stable for discharge by MD.   Attendees: Patient:  Karina Anderson 8/1/20174:35 PM  Family:   8/1/20174:35 PM  Physician:  Orson Slick 8/1/20174:35 PM  Nursing:   Carolynn Sayers, RN 8/1/20174:35 PM  Case Manager:   8/1/20174:35 PM  Counselor:  Dossie Arbour, LCSW 8/1/20174:35 PM  Other:  Everitt Amber, LRT 8/1/20174:35 PM  Other:   8/1/20174:35 PM  Other:   8/1/20174:35 PM  Other:  8/1/20174:35 PM  Other:  8/1/20174:35 PM  Other:  8/1/20174:35 PM  Other:  8/1/20174:35 PM  Other:  8/1/20174:35 PM  Other:  8/1/20174:35 PM  Other:   8/1/20174:35 PM   Scribe for Treatment Team:   August Saucer, 03/29/2016, 4:35 PM, MSW, LCSW

## 2016-03-29 NOTE — Progress Notes (Signed)
Patient discharged at this time to friend Reggie for transport in private car. Verbalizes all belongings returned. Acknowledges all belongings, Lorrin Mais, have been returned. Safety maintained.

## 2016-03-29 NOTE — [facility] Group Notes (Signed)
Goals Group Date/Time: 03/29/2016 9:00 AM Type of Therapy and Topic: Group Therapy: Goals Group: SMART Goals   Participation Level: Moderate  Description of Group:    The purpose of a daily goals group is to assist and guide patients in setting recovery/wellness-related goals. The objective is to set goals as they relate to the crisis in which they were admitted. Patients will be using SMART goal modalities to set measurable goals. Characteristics of realistic goals will be discussed and patients will be assisted in setting and processing how one will reach their goal. Facilitator will also assist patients in applying interventions and coping skills learned in psycho-education groups to the SMART goal and process how one will achieve defined goal.   Therapeutic Goals:   -Patients will develop and document one goal related to or their crisis in which brought them into treatment.  -Patients will be guided by LCSW using SMART goal setting modality in how to set a measurable, attainable, realistic and time sensitive goal.  -Patients will process barriers in reaching goal.  -Patients will process interventions in how to overcome and successful in reaching goal.   Patient's Goal :Pt shared that the pt's primary goal is to "get my children with their situation right".  Pt presented as agitated as evidenced by the pt's voice rising in pitch and tempo when the pt began to discuss the pt's children.  The pt displayed a tendency to share off topic and her sharing always returned to her voicing her frustration in situations regarding her children and a "significant other".  After the pt's initial share the pt did not share at length, only due to redirection from the CSW.  Pt was a vague historian. Pt was polite and cooperative with the CSW and other group members, however and remained focused and attentive to the topics discussed and the sharing of others, despite multiple attempts to dominate group sharing.    CSW actively validated the pt's opinion and provided feedback.   Therapeutic Modalities:  Motivational Interviewing  Research officer, political party  SMART goals setting   Dorothe Pea. Maylani Embree, LCSWA, LCAS

## 2016-03-29 NOTE — [facility] Suicide Risk Assessment (Signed)
[facility] Discharge Suicide Risk Assessment   Principal Problem: Bipolar affective disorder, manic, severe, with psychotic behavior [facility]) Discharge Diagnoses:  Patient Active Problem List   Diagnosis Date Noted  . Cannabis use disorder, moderate, dependence (HCC) [F12.20] 03/13/2016    Priority: Medium  . Involuntary commitment [Z04.6] 03/11/2016    Priority: Medium  . Tobacco use disorder [F17.200] 03/13/2016  . Bipolar affective disorder, manic, severe, with psychotic behavior (HCC) [F31.2] 03/12/2016  . Seizures (HCC) [R56.9] 03/11/2016    Total Time spent with patient: 30 minutes  Musculoskeletal: Strength & Muscle Tone: within normal limits Gait & Station: normal Patient leans: N/A  Psychiatric Specialty Exam: Review of Systems  Psychiatric/Behavioral: The patient is nervous/anxious.   All other systems reviewed and are negative.   Blood pressure 97/69, pulse 89, temperature 98.1 F (36.7 C), temperature source Oral, resp. rate 18, height 5\' 4"  (1.626 m), weight 56.2 kg (124 lb), last menstrual period 03/11/2016, SpO2 99 %.Body mass index is 21.28 kg/m.  General Appearance: Casual  Eye Contact::  Good  Speech:  Clear and Coherent409  Volume:  Normal  Mood:  Anxious  Affect:  Appropriate  Thought Process:  Goal Directed  Orientation:  Full (Time, Place, and Person)  Thought Content:  WDL  Suicidal Thoughts:  No  Homicidal Thoughts:  No  Memory:  Immediate;   Fair Recent;   Fair Remote;   Fair  Judgement:  Impaired  Insight:  Shallow  Psychomotor Activity:  Normal  Concentration:  Fair  Recall:  [employer] of Knowledge:Fair  Language: Fair  Akathisia:  No  Handed:  Right  AIMS (if indicated):     Assets:  Communication Skills Desire for Improvement Financial Resources/Insurance Physical Health Resilience Social Support  Sleep:  Number of Hours: 6  Cognition: WNL  ADL's:  Intact   Mental Status Per Nursing Assessment::   On Admission:  Thoughts of  violence towards others  Demographic Factors:  Low socioeconomic status  Loss Factors: Financial problems/change in socioeconomic status  Historical Factors: Impulsivity  Risk Reduction Factors:   Responsible for children under 29 years of age, Sense of responsibility to family, Living with another person, especially a relative, Positive social support and Positive therapeutic relationship  Continued Clinical Symptoms:  Bipolar Disorder:   Depressive phase  Cognitive Features That Contribute To Risk:  None    Suicide Risk:  Minimal: No identifiable suicidal ideation.  Patients presenting with no risk factors but with morbid ruminations; may be classified as minimal risk based on the severity of the depressive symptoms    Plan Of Care/Follow-up recommendations:  Activity:  as tolerated. Diet:  low sodium heart healthy. Other:  kep follow up appointments.  Kristine Linea, MD 03/29/2016, 9:06 AM

## 2016-04-26 LAB — PROLACTIN: PROLACTIN: 50.4 ng/mL — AB (ref 4.8–23.3)

## 2016-05-19 ENCOUNTER — Emergency Department
Admission: EM | Admit: 2016-05-19 | Discharge: 2016-05-19 | Disposition: A | Discharge status: Home / Self Care | Payer: Medicaid Other | Attending: Emergency Medicine | Admitting: Emergency Medicine

## 2016-05-19 ENCOUNTER — Encounter: Payer: Self-pay | Admitting: Emergency Medicine

## 2016-05-19 DIAGNOSIS — N390 Urinary tract infection, site not specified: Secondary | ICD-10-CM | POA: Diagnosis not present

## 2016-05-19 DIAGNOSIS — F17210 Nicotine dependence, cigarettes, uncomplicated: Secondary | ICD-10-CM | POA: Insufficient documentation

## 2016-05-19 DIAGNOSIS — E119 Type 2 diabetes mellitus without complications: Secondary | ICD-10-CM | POA: Insufficient documentation

## 2016-05-19 DIAGNOSIS — R300 Dysuria: Secondary | ICD-10-CM | POA: Diagnosis present

## 2016-05-19 LAB — URINALYSIS COMPLETE WITH MICROSCOPIC (ARMC ONLY)
BILIRUBIN URINE: NEGATIVE
GLUCOSE, UA: NEGATIVE mg/dL
KETONES UR: NEGATIVE mg/dL
Nitrite: NEGATIVE
PROTEIN: 30 mg/dL — AB
Specific Gravity, Urine: 1.02 (ref 1.005–1.030)
pH: 6 (ref 5.0–8.0)

## 2016-05-19 MED ORDER — CEPHALEXIN 500 MG PO CAPS: 500.0000 mg | ORAL_CAPSULE | Freq: Two times a day (BID) | ORAL | 0 refills | Status: AC

## 2016-05-19 MED ORDER — PHENAZOPYRIDINE HCL 200 MG PO TABS: 200.0000 mg | ORAL_TABLET | Freq: Three times a day (TID) | ORAL | 0 refills | Status: DC | PRN

## 2016-05-19 MED ORDER — CEPHALEXIN 500 MG PO CAPS
500.0000 mg | ORAL_CAPSULE | Freq: Once | ORAL | Status: AC
  Administered 2016-05-19: 500 mg via ORAL
  Filled 2016-05-19: qty 1

## 2016-05-19 MED ORDER — PHENAZOPYRIDINE HCL 200 MG PO TABS
200.0000 mg | ORAL_TABLET | Freq: Once | ORAL | Status: AC
  Administered 2016-05-19: 200 mg via ORAL
  Filled 2016-05-19: qty 1

## 2016-05-19 NOTE — ED Triage Notes (Signed)
Pt with burning on urination with frequency and abdominal cramping with some diarrhea.  Pt states history of UTI's.

## 2016-05-19 NOTE — ED Notes (Addendum)
Pt in via triage, states, "I have a UTI."  Pt with hx of the same, reports burning and urgency with urination x one week.  Pt denies any blood in urine.  Pt also reports lower abdominal pain and diarrhea x one week.  Pt A/Ox4, no immediate distress noted at this time.

## 2016-05-19 NOTE — Discharge Instructions (Signed)
Take the prescription antibiotic as directed until all pills are gone. Take the pain medicine for bladder spasm as needed. Continue to increase fluid intake including cranberry juice and water. [facility]

## 2016-05-19 NOTE — ED Provider Notes (Signed)
[facility] Emergency Department Provider Note ____________________________________________  Time seen: 1258  I have reviewed the triage vital signs and the nursing notes.  HISTORY  Chief Complaint  Dysuria and Abdominal Pain  HPI Karina Anderson is a 45 y.o. female presents to the ED for evaluation of dysuria and urinary frequency. The patient gives a history of recurrent UTIs. She denies any frank hematuria, retention, or flank pain. She describes some mild pelvic discomfort associated with urination. She denies any nausea, vomiting, or severe abdominal pain.   Past Medical History:  Diagnosis Date  . Diabetes mellitus without complication (HCC)   . Seizures [facility])     Patient Active Problem List   Diagnosis Date Noted  . Tobacco use disorder 03/13/2016  . Cannabis use disorder, moderate, dependence (HCC) 03/13/2016  . Bipolar affective disorder, manic, severe, with psychotic behavior (HCC) 03/12/2016  . Seizures (HCC) 03/11/2016  . Involuntary commitment 03/11/2016    History reviewed. No pertinent surgical history.  Prior to Admission medications   Medication Sig Start Date End Date Taking? Authorizing Provider  cephALEXin (KEFLEX) 500 MG capsule Take 1 capsule (500 mg total) by mouth 2 (two) times daily. 05/19/16 05/26/16  Ksean Vale V Bacon Noely Kuhnle, PA-C  hydrOXYzine (ATARAX/VISTARIL) 50 MG tablet Take 1 tablet (50 mg total) by mouth every 6 (six) hours as needed for anxiety. 03/29/16   Shari Prows, MD  phenazopyridine (PYRIDIUM) 200 MG tablet Take 1 tablet (200 mg total) by mouth 3 (three) times daily as needed for pain. 05/19/16   Arlie Posch V Bacon Safiyyah Vasconez, PA-C  phenytoin (DILANTIN) 100 MG ER capsule Take 1 capsule (100 mg total) by mouth 3 (three) times daily. 03/29/16   Shari Prows, MD  QUEtiapine (SEROQUEL) 400 MG tablet Take 2 tablets (800 mg total) by mouth at bedtime. 03/29/16   Shari Prows, MD    Allergies Bactrim  [sulfamethoxazole-trimethoprim]; Butorphanol tartrate; Food; Ibuprofen; Ketorolac; Lorazepam; Naproxen; Nsaids; and Prochlorperazine  No family history on file.  Social History Social History  Substance Use Topics  . Smoking status: Current Every Day Smoker    Packs/day: 0.50    Types: Cigarettes  . Smokeless tobacco: Not on file  . Alcohol use No   Review of Systems  Constitutional: Negative for fever. Gastrointestinal: Negative for abdominal pain, vomiting and diarrhea. Genitourinary: Positive for dysuria & frequency. Denies hematuria, retention. Musculoskeletal: Negative for back pain. Skin: Negative for rash. Neurological: Negative for headaches, focal weakness or numbness. ____________________________________________  PHYSICAL EXAM:  VITAL SIGNS: ED Triage Vitals  Enc Vitals Group     BP 05/19/16 1055 102/78     Pulse Rate 05/19/16 1055 90     Resp 05/19/16 1055 17     Temp 05/19/16 1055 98.2 F (36.8 C)     Temp Source 05/19/16 1055 Oral     SpO2 05/19/16 1055 96 %     Weight 05/19/16 1056 155 lb (70.3 kg)     Height 05/19/16 1056 5\' 5"  (1.651 m)     Head Circumference --      Peak Flow --      Pain Score 05/19/16 1113 10     Pain Loc --      Pain Edu? --      Excl. in GC? --     Constitutional: Alert and oriented. Well appearing and in no distress. Head: Normocephalic and atraumatic. Cardiovascular: Normal rate, regular rhythm.  Respiratory: Normal respiratory effort. No wheezes/rales/rhonchi. Gastrointestinal: Soft and nontender. No distention,  rebound, guarding, or CVA tenderness. GU: defered Musculoskeletal: Nontender with normal range of motion in all extremities.  Psychiatric: Mood and affect are normal. Patient exhibits appropriate insight and judgment. ____________________________________________   LABS (pertinent positives/negatives) Labs Reviewed  URINALYSIS COMPLETEWITH MICROSCOPIC ([facility] ONLY) - Abnormal; Notable for the following:        Result Value   Color, Urine YELLOW (*)    APPearance CLOUDY (*)    Hgb urine dipstick 1+ (*)    Protein, ur 30 (*)    Leukocytes, UA 1+ (*)    Bacteria, UA RARE (*)    Squamous Epithelial / LPF 6-30 (*)    All other components within normal limits  URINE CULTURE  ____________________________________________  PROCEDURES  Keflex 500 mg PO Pyridium 200 mg PO ____________________________________________  INITIAL IMPRESSION / ASSESSMENT AND PLAN / ED COURSE  Patient with an acute lower urinary tract infection. She is discharged with a prescription for Pyridium and Keflex dose as directed. She will continue to increase fluid intake including cranberry juice and water, and dosing about as prescribed. She will [facility] for [facility] management. Urine culture is pending at the time of discharge.  Clinical Course   ____________________________________________  FINAL CLINICAL IMPRESSION(S) / ED DIAGNOSES  Final diagnoses:  UTI (lower urinary tract infection)      Lissa HoardJenise V Bacon Juliane Guest, PA-C 05/19/16 1423    Jene Everyobert Kinner, MD 05/19/16 1500

## 2016-05-20 LAB — URINE CULTURE: SPECIAL REQUESTS: NORMAL

## 2016-05-31 LAB — PHENYTOIN LEVEL, FREE AND TOTAL
Phenytoin, Free: 0.6 ug/mL — ABNORMAL LOW (ref 1.0–2.0)
Phenytoin, Total: 5.3 ug/mL — ABNORMAL LOW (ref 10.0–20.0)

## 2016-10-25 ENCOUNTER — Other Ambulatory Visit: Payer: Self-pay | Admitting: Psychiatry

## 2016-10-27 ENCOUNTER — Encounter: Payer: Self-pay | Admitting: Emergency Medicine

## 2016-10-27 ENCOUNTER — Emergency Department
Admission: EM | Admit: 2016-10-27 | Discharge: 2016-10-27 | Disposition: A | Discharge status: Home / Self Care | Payer: Medicaid Other | Attending: Emergency Medicine | Admitting: Emergency Medicine

## 2016-10-27 ENCOUNTER — Emergency Department: Payer: Medicaid Other

## 2016-10-27 DIAGNOSIS — E119 Type 2 diabetes mellitus without complications: Secondary | ICD-10-CM | POA: Insufficient documentation

## 2016-10-27 DIAGNOSIS — M79674 Pain in right toe(s): Secondary | ICD-10-CM | POA: Diagnosis not present

## 2016-10-27 DIAGNOSIS — F17210 Nicotine dependence, cigarettes, uncomplicated: Secondary | ICD-10-CM | POA: Insufficient documentation

## 2016-10-27 MED ORDER — ONDANSETRON HCL 4 MG PO TABS: 4.0000 mg | ORAL_TABLET | Freq: Two times a day (BID) | ORAL | 1 refills | Status: AC

## 2016-10-27 MED ORDER — NAPROXEN 500 MG PO TBEC: 500.0000 mg | DELAYED_RELEASE_TABLET | Freq: Two times a day (BID) | ORAL | 0 refills | Status: AC

## 2016-10-27 MED ORDER — NAPROXEN 500 MG PO TABS
500.0000 mg | ORAL_TABLET | Freq: Once | ORAL | Status: AC
  Administered 2016-10-27: 500 mg via ORAL
  Filled 2016-10-27: qty 1

## 2016-10-27 MED ORDER — ONDANSETRON 4 MG PO TBDP
4.0000 mg | ORAL_TABLET | Freq: Once | ORAL | Status: AC
  Administered 2016-10-27: 4 mg via ORAL
  Filled 2016-10-27: qty 1

## 2016-10-27 NOTE — ED Notes (Signed)

## 2016-10-27 NOTE — ED Notes (Signed)
Pt reports having right big toe pain for the past week. Swelling noted. Warmth noted. Pt able to move foot and toe but reports increased pain when doing so. No Hx of Gout.

## 2016-10-27 NOTE — ED Triage Notes (Signed)
Started having right great toe pain a week ago. Denies injury. Ambulatory to triage without difficulty.

## 2016-10-27 NOTE — ED Provider Notes (Signed)
[facility] Emergency Department Provider Note  ____________________________________________  Time seen: Approximately 4:40 PM  I have reviewed the triage vital signs and the nursing notes.   HISTORY  Chief Complaint Toe Pain    HPI Karina Anderson is a 46 y.o. female with a history of diabetes presents to the emergency department with right great toe pain for the past week. Patient denies trauma to the right great toe. Patient denies prior surgeries to the right great toe. Patient states that right great toe pain is 10 out of 10 in intensity and throbbing, worse with ambulation. Patient has not been diagnosed with gout in the past. Patient states that she does not drink alcohol but she smokes daily. Patient's diet is not high in red meat. Patient states that she has been soaking her right foot in hot water. Patient states that pain returns as soon as "the water dries". No other alleviating measures have been attempted. Patient denies a history of septic joints. She has been afebrile. Patient denies chest pain, chest tightness, shortness of breath, abdominal pain, nausea and vomiting.      Past Medical History:  Diagnosis Date  . Diabetes mellitus without complication (HCC)   . Seizures [facility](HCC)     Patient Active Problem List   Diagnosis Date Noted  . Tobacco use disorder 03/13/2016  . Cannabis use disorder, moderate, dependence (HCC) 03/13/2016  . Bipolar affective disorder, manic, severe, with psychotic behavior (HCC) 03/12/2016  . Seizures (HCC) 03/11/2016  . Involuntary commitment 03/11/2016    History reviewed. No pertinent surgical history.  Prior to Admission medications   Medication Sig Start Date End Date Taking? Authorizing Provider  hydrOXYzine (ATARAX/VISTARIL) 50 MG tablet Take 1 tablet (50 mg total) by mouth every 6 (six) hours as needed for anxiety. 03/29/16   Shari ProwsJolanta B Pucilowska, MD  naproxen (EC NAPROSYN) 500 MG EC tablet Take 1 tablet (500  mg total) by mouth 2 (two) times daily with a meal. 10/27/16 11/03/16  Orvil FeilJaclyn M Woods, PA-C  ondansetron (ZOFRAN) 4 MG tablet Take 1 tablet (4 mg total) by mouth 2 (two) times daily. 10/27/16 11/03/16  Orvil FeilJaclyn M Woods, PA-C  phenazopyridine (PYRIDIUM) 200 MG tablet Take 1 tablet (200 mg total) by mouth 3 (three) times daily as needed for pain. 05/19/16   Jenise V Bacon Menshew, PA-C  phenytoin (DILANTIN) 100 MG ER capsule Take 1 capsule (100 mg total) by mouth 3 (three) times daily. 03/29/16   Shari ProwsJolanta B Pucilowska, MD  QUEtiapine (SEROQUEL) 400 MG tablet Take 2 tablets (800 mg total) by mouth at bedtime. 03/29/16   Shari ProwsJolanta B Pucilowska, MD    Allergies Bactrim [sulfamethoxazole-trimethoprim]; Butorphanol tartrate; Food; Ibuprofen; Ketorolac; Lorazepam; Naproxen; Nsaids; and Prochlorperazine  History reviewed. No pertinent family history.  Social History Social History  Substance Use Topics  . Smoking status: Current Every Day Smoker    Packs/day: 0.50    Types: Cigarettes  . Smokeless tobacco: Not on file  . Alcohol use No     Review of Systems  Constitutional: No fever/chills Eyes: No visual changes. No discharge ENT: No upper respiratory complaints. Cardiovascular: no chest pain. Respiratory: no cough. No SOB. Gastrointestinal: No abdominal pain.  No nausea, no vomiting.  No diarrhea.  No constipation. Musculoskeletal: Patient has right great toe pain.  Skin: Negative for erythema of the skin overlying the right great toe. Neurological: Negative for headaches, focal weakness or numbness. ____________________________________________   PHYSICAL EXAM:  VITAL SIGNS: ED Triage Vitals  Enc Vitals  Group     BP 10/27/16 1610 129/88     Pulse Rate 10/27/16 1610 93     Resp 10/27/16 1610 18     Temp 10/27/16 1610 98.1 F (36.7 C)     Temp Source 10/27/16 1610 Oral     SpO2 10/27/16 1610 100 %     Weight 10/27/16 1608 165 lb (74.8 kg)     Height --      Head Circumference --      Peak  Flow --      Pain Score 10/27/16 1608 10     Pain Loc --      Pain Edu? --      Excl. in GC? --    Constitutional: Alert and oriented. Well appearing and in no acute distress. Cardiovascular: Regular rate and rhythm without murmurs gallops or rubs. Respiratory: No adventitious lung sounds auscultated. Musculoskeletal: Patient has 5 out of 5 strength in the lower extremities bilaterally. To inspection, feet appear symmetric. Patient has full range of motion at the hip, knee and ankle bilaterally. Patient is able to move all 5 right toes. Patient has exquisite tenderness to palpation along the right great toe. Patient has pronation of great toe. No erythema or edema is visualized. Palpable dorsalis pedis pulse bilaterally and symmetrically. Neurologic:  Normal speech and language. No gross focal neurologic deficits are appreciated. Reflexes are 2+ and symmetric in the lower extremities bilaterally. Psychiatric: Mood and affect are normal. Speech and behavior are normal. Patient exhibits appropriate insight and judgement. ____________________________________________   LABS (all labs ordered are listed, but only abnormal results are displayed)  Labs Reviewed - No data to display ____________________________________________  EKG   ____________________________________________  RADIOLOGY Geraldo Pitter, personally viewed and evaluated these images (plain radiographs) as part of my medical decision making, as well as reviewing the written report by the radiologist.    Dg Foot Complete Right  Result Date: 10/27/2016 CLINICAL DATA:  Right great toe pain times 2-3 days EXAM: RIGHT FOOT COMPLETE - 3+ VIEW COMPARISON:  None. FINDINGS: There is no evidence of fracture or dislocation. Hallux valgus noted. Mild soft tissue thickening adjacent to the first metatarsal head without abnormal soft tissue mineralization or extra articular erosions. IMPRESSION: Hallux valgus with mild soft tissue  thickening and prominence adjacent to the first metatarsal head low without evidence of extra-articular erosive change. Findings may represent reactive thickening due to the hallux valgus configuration of the great toe. Clinical correlation is however recommended to exclude changes of early crystalline arthropathy. Electronically Signed   By: Tollie Eth M.D.   On: 10/27/2016 17:06    ____________________________________________    PROCEDURES  Procedure(s) performed:    Procedures    Medications  naproxen (NAPROSYN) tablet 500 mg (500 mg Oral Given 10/27/16 1644)  ondansetron (ZOFRAN-ODT) disintegrating tablet 4 mg (4 mg Oral Given 10/27/16 1644)     ____________________________________________   INITIAL IMPRESSION / ASSESSMENT AND PLAN / ED COURSE  Pertinent labs & imaging results that were available during my care of the patient were reviewed by me and considered in my medical decision making (see chart for details).  Review of the [place] CSRS was performed in accordance of the [facility] prior to dispensing any controlled drugs.    Assessment and Plan:  Right Great Toe Pain:  Patient presents to the emergency department with right great toe pain for the past week. On physical exam, patient has exquisite tenderness over the right great toe. DG right  foot reveals findings consistent with right hallux valgus. I suspect the patient is experiencing an early gout flare. Patient was given naproxen and Zofran in the emergency department. She was discharged with naproxen. A referral was made to podiatry. Patient was advised to make an appointment in one week if right great toe pain persists. Vital signs are reassuring at this time. All patient questions were answered. ____________________________________________  FINAL CLINICAL IMPRESSION(S) / ED DIAGNOSES  Final diagnoses:  Great toe pain, right      NEW MEDICATIONS STARTED DURING THIS VISIT:  Discharge Medication List as of 10/27/2016   5:16 PM    START taking these medications   Details  naproxen (EC NAPROSYN) 500 MG EC tablet Take 1 tablet (500 mg total) by mouth 2 (two) times daily with a meal., Starting Thu 10/27/2016, Until Thu 11/03/2016, Print    ondansetron (ZOFRAN) 4 MG tablet Take 1 tablet (4 mg total) by mouth 2 (two) times daily., Starting Thu 10/27/2016, Until Thu 11/03/2016, Print            This chart was dictated using voice recognition software/Dragon. Despite best efforts to proofread, errors can occur which can change the meaning. Any change was purely unintentional.    Orvil Feil, PA-C 10/27/16 1726    Minna Antis, MD 10/29/16 0530

## 2016-12-30 IMAGING — CT CT RENAL STONE PROTOCOL
1 of 2 series · 5 of 32 positions shown, 10 images · non-contrast
Comparison: None.

CLINICAL DATA: Right-sided abdominal pain for 3 weeks

EXAM:
CT ABDOMEN AND PELVIS WITHOUT CONTRAST
TECHNIQUE: Multidetector CT imaging of the abdomen and pelvis was performed
following the standard protocol without IV contrast.

[Series 4: lung windows · axial · 0.65mm/px · z∈[+98,+178]mm · 5 of 25 slices shown, 10 images]
[im 5/25  soft-tissue]
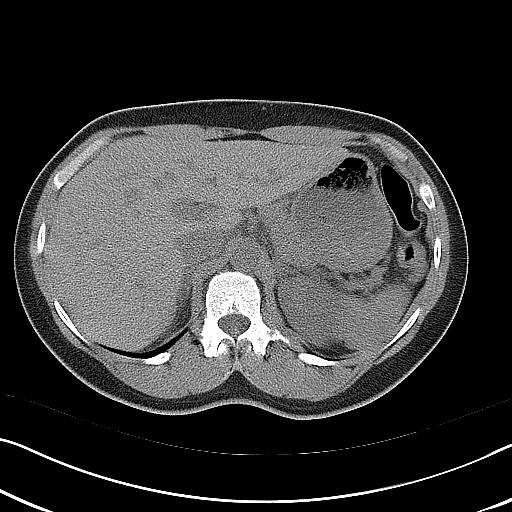
[im 5/25  bone]
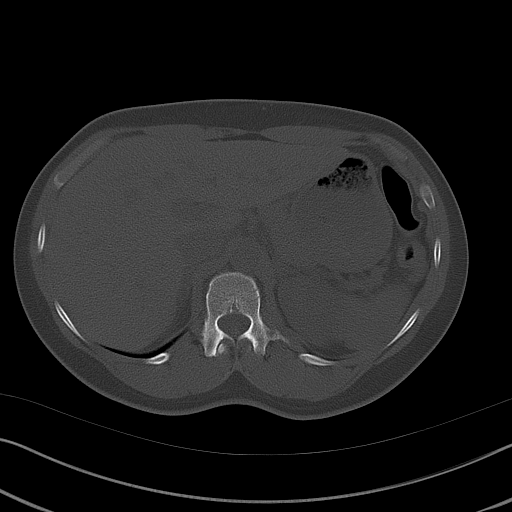
[im 9/25  soft-tissue]
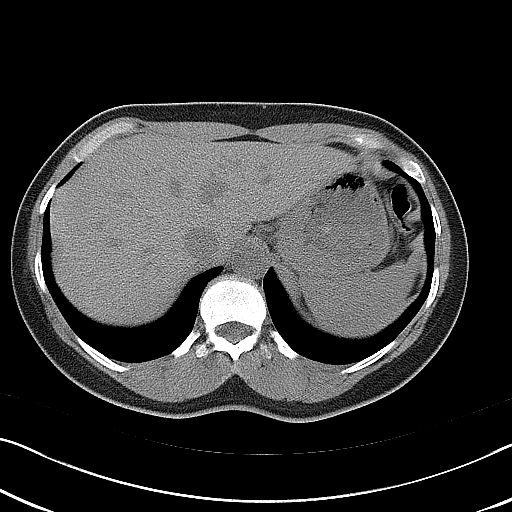
[im 9/25  lung]
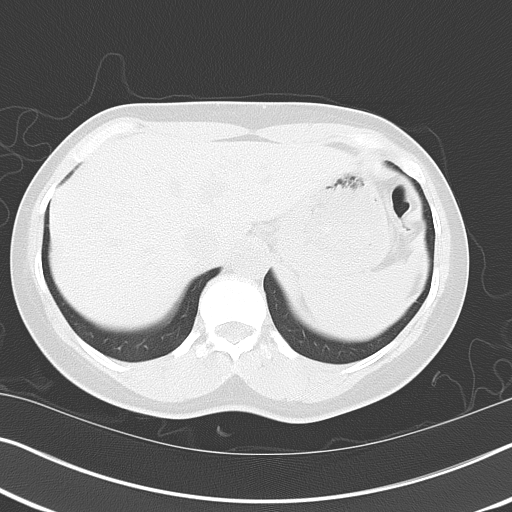
[im 13/25  soft-tissue]
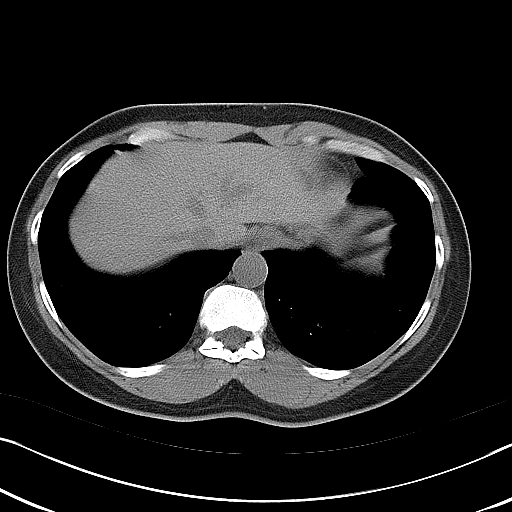
[im 13/25  lung]
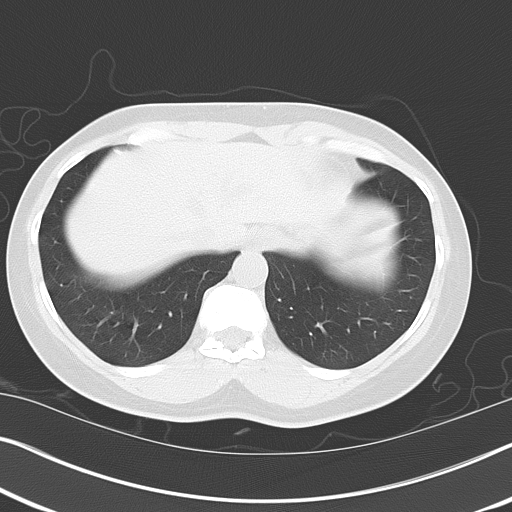
[im 17/25  soft-tissue]
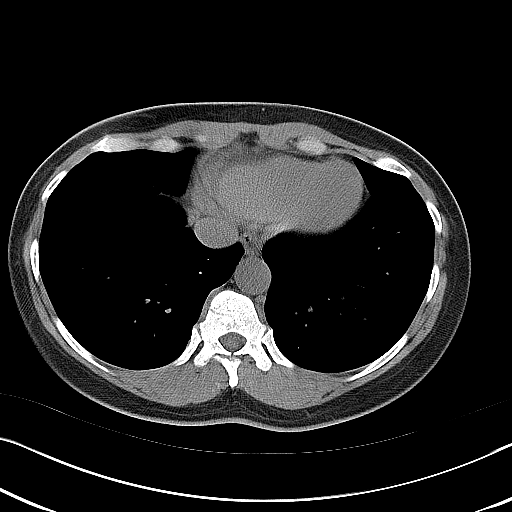
[im 17/25  lung]
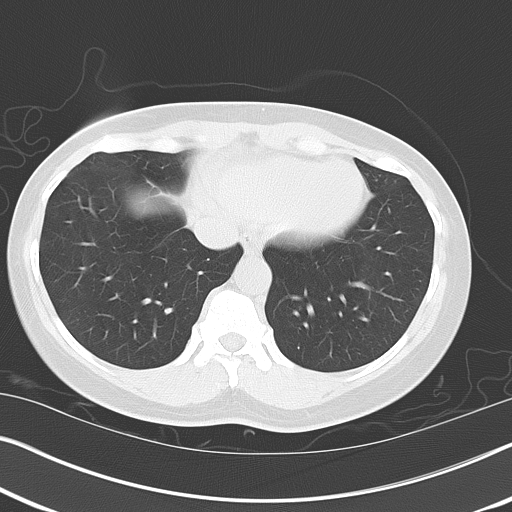
[im 21/25  soft-tissue]
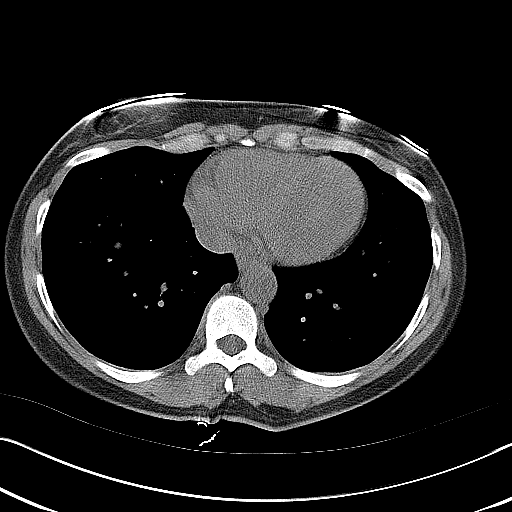
[im 21/25  lung]
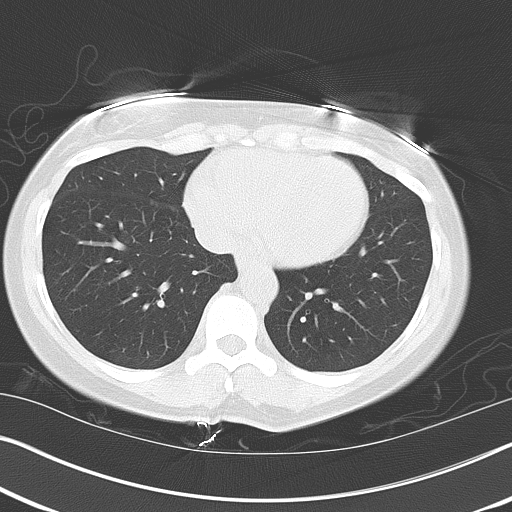

[5 of 32 positions shown; findings below may reference images not displayed]

FINDINGS: The lung bases are free of acute infiltrate or sizable effusion.

The liver, gallbladder, spleen, adrenal glands and pancreas are
within normal limits. The kidneys are well visualized and within
normal limits. No renal calculi or obstructive changes are seen.

Changes of prior tubal ligation are noted. The bladder is
decompressed. In the expected region of the distal right ureter
there is a 3 mm calcification identified on image number 73 of
series 2. This likely represents an intermittently obstructing
distal right ureteral stone. Retrograde pyelography may be helpful
for further evaluation. No free pelvic fluid is seen. The uterus is
within normal limits. The appendix is not well visualized. No
inflammatory changes to suggest appendicitis are seen. No bony
abnormality is noted.
IMPRESSION: Changes suggestive of a a nonobstructing distal right ureteral
stone. This may cause intermittent symptoms and further evaluation
is recommended.

Nonvisualization of the appendix.  No inflammatory changes are seen.

## 2017-02-28 ENCOUNTER — Emergency Department: Payer: Medicaid Other

## 2017-02-28 ENCOUNTER — Encounter: Payer: Self-pay | Admitting: Emergency Medicine

## 2017-02-28 ENCOUNTER — Emergency Department
Admission: EM | Admit: 2017-02-28 | Discharge: 2017-02-28 | Disposition: A | Discharge status: Home / Self Care | Payer: Medicaid Other | Attending: Emergency Medicine | Admitting: Emergency Medicine

## 2017-02-28 DIAGNOSIS — Z79899 Other long term (current) drug therapy: Secondary | ICD-10-CM | POA: Insufficient documentation

## 2017-02-28 DIAGNOSIS — Y92017 Garden or yard in single-family (private) house as the place of occurrence of the external cause: Secondary | ICD-10-CM | POA: Insufficient documentation

## 2017-02-28 DIAGNOSIS — Y93H2 Activity, gardening and landscaping: Secondary | ICD-10-CM | POA: Diagnosis not present

## 2017-02-28 DIAGNOSIS — Y998 Other external cause status: Secondary | ICD-10-CM | POA: Diagnosis not present

## 2017-02-28 DIAGNOSIS — W208XXA Other cause of strike by thrown, projected or falling object, initial encounter: Secondary | ICD-10-CM | POA: Insufficient documentation

## 2017-02-28 DIAGNOSIS — E119 Type 2 diabetes mellitus without complications: Secondary | ICD-10-CM | POA: Diagnosis not present

## 2017-02-28 DIAGNOSIS — S9031XA Contusion of right foot, initial encounter: Secondary | ICD-10-CM | POA: Diagnosis not present

## 2017-02-28 DIAGNOSIS — F17210 Nicotine dependence, cigarettes, uncomplicated: Secondary | ICD-10-CM | POA: Diagnosis not present

## 2017-02-28 DIAGNOSIS — S99821A Other specified injuries of right foot, initial encounter: Secondary | ICD-10-CM | POA: Diagnosis present

## 2017-02-28 MED ORDER — TRAMADOL HCL 50 MG PO TABS: 50.0000 mg | ORAL_TABLET | Freq: Two times a day (BID) | ORAL | 0 refills | Status: DC

## 2017-02-28 MED ORDER — TRAMADOL HCL 50 MG PO TABS
50.0000 mg | ORAL_TABLET | Freq: Once | ORAL | Status: AC
  Administered 2017-02-28: 50 mg via ORAL
  Filled 2017-02-28: qty 1

## 2017-02-28 NOTE — Discharge Instructions (Signed)
Your exam and x-ray is normal today. Apply ice to reduce swelling. Follow-up with Dr. Ether GriffinsFowler for continued symptoms. Take the pain medicine as needed.

## 2017-02-28 NOTE — ED Triage Notes (Signed)
States she dropped a cinder block on to right foot today

## 2017-02-28 NOTE — ED Provider Notes (Signed)
[facility] Emergency Department Provider Note ____________________________________________  Time seen: 1546  I have reviewed the triage vital signs and the nursing notes.  HISTORY  Chief Complaint  Foot Pain  HPI Karina Anderson is a 46 y.o. female presents to the ED for evaluation of dorsal right foot pain after she actually dropped a cinder block on top of her foot. 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 She has only dropped a cinderblock from shoulder level to the ground,but it bounced off the top of her foot. She presents now with pain, swelling, disability to the right foot. She denies any other injury, accident, or trauma.  Past Medical History:  Diagnosis Date  . Diabetes mellitus without complication (HCC)   . Seizures [facility](HCC)     Patient Active Problem List   Diagnosis Date Noted  . Tobacco use disorder 03/13/2016  . Cannabis use disorder, moderate, dependence (HCC) 03/13/2016  . Bipolar affective disorder, manic, severe, with psychotic behavior (HCC) 03/12/2016  . Seizures (HCC) 03/11/2016  . Involuntary commitment 03/11/2016    History reviewed. No pertinent surgical history.  Prior to Admission medications   Medication Sig Start Date End Date Taking? Authorizing Provider  hydrOXYzine (ATARAX/VISTARIL) 50 MG tablet Take 1 tablet (50 mg total) by mouth every 6 (six) hours as needed for anxiety. 03/29/16   Pucilowska, Braulio ConteJolanta B, MD  phenazopyridine (PYRIDIUM) 200 MG tablet Take 1 tablet (200 mg total) by mouth 3 (three) times daily as needed for pain. 05/19/16   Kenslei Hearty, Charlesetta IvoryJenise V Bacon, PA-C  phenytoin (DILANTIN) 100 MG ER capsule Take 1 capsule (100 mg total) by mouth 3 (three) times daily. 03/29/16   Pucilowska, Jolanta B, MD  QUEtiapine (SEROQUEL) 400 MG tablet Take 2 tablets (800 mg total) by mouth at bedtime. 03/29/16   Pucilowska, Braulio ConteJolanta B, MD  traMADol (ULTRAM) 50 MG tablet Take 1 tablet (50 mg  total) by mouth 2 (two) times daily. 02/28/17   Jamiah Homeyer, Charlesetta IvoryJenise V Bacon, PA-C    Allergies Bactrim [sulfamethoxazole-trimethoprim]; Butorphanol tartrate; Food; Ibuprofen; Ketorolac; Lorazepam; Naproxen; Nsaids; and Prochlorperazine  No family history on file.  Social History Social History  Substance Use Topics  . Smoking status: Current Every Day Smoker    Packs/day: 0.50    Types: Cigarettes  . Smokeless tobacco: Never Used  . Alcohol use No    Review of Systems  Constitutional: Negative for fever. Cardiovascular: Negative for chest pain. Respiratory: Negative for shortness of breath. Musculoskeletal: Negative for back pain. Right foot pain as above Skin: Negative for rash. Neurological: Negative for headaches, focal weakness or numbness. ____________________________________________  PHYSICAL EXAM:  VITAL SIGNS: ED Triage Vitals  Enc Vitals Group     BP      Pulse      Resp      Temp      Temp src      SpO2      Weight      Height      Head Circumference      Peak Flow      Pain Score      Pain Loc      Pain Edu?      Excl. in GC?     Constitutional: Alert and oriented. Well appearing and in no distress. Head: Normocephalic and atraumatic. Cardiovascular: Normal rate, regular rhythm. Normal distal pulses. Respiratory: Normal respiratory effort.  Musculoskeletal: Right foot without any obvious deformity, dislocation, or effusion. Patient with some subtle  soft tissue swelling to the mid foot dorsally. Superficial abrasions noted there. Nontender with normal range of motion in all extremities.  Neurologic:  Mildly antalgic gait without ataxia. Normal gross sensation. No gross focal neurologic deficits are appreciated. Skin:  Skin is warm, dry and intact. No rash noted. ____________________________   RADIOLOGY  Right Foot  IMPRESSION: There is no acute bony abnormality of the right foot. There is mild overlying soft tissue swelling in the metatarsal region.  Stable hallux valgus contour of the first ray ____________________________________________  PROCEDURES  Ace bandage  Ultram 50 mg PO ____________________________________________  INITIAL IMPRESSION / ASSESSMENT AND PLAN / ED COURSE  Patient with a right foot contusion without evidence of fracture or dislocation. Apply ice and dose the prescription pain medicine as directed. Dose Benadryl for any itching that develops. Follow-up with Dr. Ether Griffins for continued symptoms.  ____________________________________________  FINAL CLINICAL IMPRESSION(S) / ED DIAGNOSES  Final diagnoses:  Contusion of right foot, initial encounter      Lissa Hoard, PA-C 02/28/17 1623    Arnaldo Natal, MD 02/28/17 2032

## 2017-04-08 ENCOUNTER — Encounter: Payer: Self-pay | Admitting: Emergency Medicine

## 2017-04-08 ENCOUNTER — Emergency Department
Admission: EM | Admit: 2017-04-08 | Discharge: 2017-04-08 | Disposition: A | Discharge status: Home / Self Care | Payer: Medicaid Other | Attending: Emergency Medicine | Admitting: Emergency Medicine

## 2017-04-08 ENCOUNTER — Emergency Department: Payer: Medicaid Other

## 2017-04-08 DIAGNOSIS — F17210 Nicotine dependence, cigarettes, uncomplicated: Secondary | ICD-10-CM | POA: Insufficient documentation

## 2017-04-08 DIAGNOSIS — M545 Low back pain, unspecified: Secondary | ICD-10-CM

## 2017-04-08 DIAGNOSIS — E119 Type 2 diabetes mellitus without complications: Secondary | ICD-10-CM | POA: Insufficient documentation

## 2017-04-08 DIAGNOSIS — Z79899 Other long term (current) drug therapy: Secondary | ICD-10-CM | POA: Insufficient documentation

## 2017-04-08 MED ORDER — CYCLOBENZAPRINE HCL 10 MG PO TABS: 10.0000 mg | ORAL_TABLET | Freq: Three times a day (TID) | ORAL | 0 refills | Status: AC | PRN

## 2017-04-08 NOTE — ED Triage Notes (Signed)
Pt reports low back pain for three days. Pt reports history of degenerative disc disease. Pt ambulatory to triage.

## 2017-04-08 NOTE — ED Provider Notes (Signed)
[facility] Emergency Department Provider Note  ____________________________________________  Time seen: Approximately 7:35 PM  I have reviewed the triage vital signs and the nursing notes.   HISTORY  Chief Complaint Back Pain    HPI Karina Anderson is a 46 y.o. female presenting to the emergency department with 10/10 bilateral low back pain for the past 3 days. She states that she has a history of DJD. Patient denies symptoms consistent with radiculopathy. She denies dysuria, increased urinary frequency or hematuria. Patient denies weakness and has been ambulating without difficulty. Patient denies bowel or bladder incontinence or incidences of recent trauma.   Past Medical History:  Diagnosis Date  . Diabetes mellitus without complication (HCC)   . Seizures [facility](HCC)     Patient Active Problem List   Diagnosis Date Noted  . Tobacco use disorder 03/13/2016  . Cannabis use disorder, moderate, dependence (HCC) 03/13/2016  . Bipolar affective disorder, manic, severe, with psychotic behavior (HCC) 03/12/2016  . Seizures (HCC) 03/11/2016  . Involuntary commitment 03/11/2016    No past surgical history on file.  Prior to Admission medications   Medication Sig Start Date End Date Taking? Authorizing Provider  cyclobenzaprine (FLEXERIL) 10 MG tablet Take 1 tablet (10 mg total) by mouth 3 (three) times daily as needed for muscle spasms. 04/08/17 04/13/17  Orvil FeilWoods, Blaire Hodsdon M, PA-C  hydrOXYzine (ATARAX/VISTARIL) 50 MG tablet Take 1 tablet (50 mg total) by mouth every 6 (six) hours as needed for anxiety. 03/29/16   Pucilowska, Braulio ConteJolanta B, MD  phenazopyridine (PYRIDIUM) 200 MG tablet Take 1 tablet (200 mg total) by mouth 3 (three) times daily as needed for pain. 05/19/16   Menshew, Charlesetta IvoryJenise V Bacon, PA-C  phenytoin (DILANTIN) 100 MG ER capsule Take 1 capsule (100 mg total) by mouth 3 (three) times daily. 03/29/16   Pucilowska, Jolanta B, MD  QUEtiapine (SEROQUEL) 400 MG tablet Take  2 tablets (800 mg total) by mouth at bedtime. 03/29/16   Pucilowska, Braulio ConteJolanta B, MD  traMADol (ULTRAM) 50 MG tablet Take 1 tablet (50 mg total) by mouth 2 (two) times daily. 02/28/17   Menshew, Charlesetta IvoryJenise V Bacon, PA-C    Allergies Bactrim [sulfamethoxazole-trimethoprim]; Butorphanol tartrate; Food; Ibuprofen; Ketorolac; Lorazepam; Naproxen; Nsaids; and Prochlorperazine  No family history on file.  Social History Social History  Substance Use Topics  . Smoking status: Current Every Day Smoker    Packs/day: 0.50    Types: Cigarettes  . Smokeless tobacco: Never Used  . Alcohol use No     Review of Systems  Constitutional: No fever/chills Eyes: No visual changes. No discharge ENT: No upper respiratory complaints. Cardiovascular: no chest pain. Respiratory: no cough. No SOB. Gastrointestinal: No abdominal pain.  No nausea, no vomiting.  No diarrhea.  No constipation. Genitourinary: Negative for dysuria. No hematuria Musculoskeletal: She has low back pain. Skin: Negative for rash, abrasions, lacerations, ecchymosis. Neurological: Negative for headaches, focal weakness or numbness.  ____________________________________________   PHYSICAL EXAM:  VITAL SIGNS: ED Triage Vitals [04/08/17 1835]  Enc Vitals Group     BP (!) 127/104     Pulse Rate 95     Resp 18     Temp 98.4 F (36.9 C)     Temp Source Oral     SpO2 100 %     Weight 165 lb (74.8 kg)     Height 5\' 5"  (1.651 m)     Head Circumference      Peak Flow      Pain Score 10  Pain Loc      Pain Edu?      Excl. in GC?      Constitutional: Alert and oriented. Well appearing and in no acute distress. Eyes: Conjunctivae are normal. PERRL. EOMI. Head: Atraumatic. Cardiovascular: Normal rate, regular rhythm. Normal S1 and S2.  Good peripheral circulation. Respiratory: Normal respiratory effort without tachypnea or retractions. Lungs CTAB. Good air entry to the bases with no decreased or absent breath  sounds. Gastrointestinal: Bowel sounds 4 quadrants. Soft and nontender to palpation. No guarding or rigidity. No palpable masses. No distention. No CVA tenderness. Musculoskeletal: Patient has 5/5 strength in the upper and lower extremities bilaterally. Full range of motion at the shoulder, elbow and wrist bilaterally. Full range of motion at the hip, knee and ankle bilaterally. No changes in gait. No pain was elicited with palpation along the lumbar spine. Neurologic:  Normal speech and language. No gross focal neurologic deficits are appreciated. Negative sraight leg raise test bilaterally. Skin:  Skin is warm, dry and intact. No rash noted. Psychiatric: Mood and affect are normal. Speech and behavior are normal. Patient exhibits appropriate insight and judgement.   ____________________________________________   LABS (all labs ordered are listed, but only abnormal results are displayed)  Labs Reviewed - No data to display ____________________________________________  EKG   ____________________________________________  RADIOLOGY Geraldo Pitter, personally viewed and evaluated these images (plain radiographs) as part of my medical decision making, as well as reviewing the written report by the radiologist.   Dg Lumbar Spine Complete  Result Date: 04/08/2017 CLINICAL DATA:  46 year old female with pressure in the low back for the past 4 months. EXAM: LUMBAR SPINE - COMPLETE 4+ VIEW COMPARISON:  No priors. FINDINGS: There is no evidence of lumbar spine fracture. Alignment is normal. Intervertebral disc spaces are maintained. Tubal ligation clips noted in the low anatomic pelvis. IMPRESSION: Negative. Electronically Signed   By: Trudie Reed M.D.   On: 04/08/2017 19:34    ____________________________________________    PROCEDURES  Procedure(s) performed:    Procedures    Medications - No data to display   ____________________________________________   INITIAL  IMPRESSION / ASSESSMENT AND PLAN / ED COURSE  Pertinent labs & imaging results that were available during my care of the patient were reviewed by me and considered in my medical decision making (see chart for details).  Review of the [place] CSRS was performed in accordance of the [facility] prior to dispensing any controlled drugs.     Assessment and plan Low back pain Patient presents to the emergency department with bilateral low back pain without radiculopathy. Patient was prescribed Flexeril at discharge. Patient was advised to follow-up with primary care as needed. Vital signs are reassuring prior to discharge.  ____________________________________________  FINAL CLINICAL IMPRESSION(S) / ED DIAGNOSES  Final diagnoses:  Acute bilateral low back pain without sciatica      NEW MEDICATIONS STARTED DURING THIS VISIT:  New Prescriptions   CYCLOBENZAPRINE (FLEXERIL) 10 MG TABLET    Take 1 tablet (10 mg total) by mouth 3 (three) times daily as needed for muscle spasms.        This chart was dictated using voice recognition software/Dragon. Despite best efforts to proofread, errors can occur which can change the meaning. Any change was purely unintentional.    Orvil Feil, PA-C 04/08/17 1947    Sharyn Creamer, MD 04/09/17 [phone]

## 2017-05-10 ENCOUNTER — Other Ambulatory Visit: Payer: Self-pay | Admitting: Family Medicine

## 2017-05-10 DIAGNOSIS — N9489 Other specified conditions associated with female genital organs and menstrual cycle: Secondary | ICD-10-CM

## 2017-05-17 ENCOUNTER — Ambulatory Visit
Admission: RE | Admit: 2017-05-17 | Discharge: 2017-05-17 | Disposition: A | Discharge status: Home / Self Care | Payer: Medicaid Other | Source: Ambulatory Visit | Attending: Family Medicine | Admitting: Family Medicine

## 2017-05-17 DIAGNOSIS — N9489 Other specified conditions associated with female genital organs and menstrual cycle: Secondary | ICD-10-CM | POA: Insufficient documentation

## 2017-07-23 ENCOUNTER — Emergency Department: Payer: Medicaid Other

## 2017-07-23 ENCOUNTER — Other Ambulatory Visit: Payer: Self-pay

## 2017-07-23 ENCOUNTER — Encounter: Payer: Self-pay | Admitting: Emergency Medicine

## 2017-07-23 ENCOUNTER — Emergency Department
Admission: EM | Admit: 2017-07-23 | Discharge: 2017-07-23 | Disposition: A | Discharge status: Home / Self Care | Payer: Medicaid Other | Attending: Emergency Medicine | Admitting: Emergency Medicine

## 2017-07-23 DIAGNOSIS — N9489 Other specified conditions associated with female genital organs and menstrual cycle: Secondary | ICD-10-CM

## 2017-07-23 DIAGNOSIS — F17210 Nicotine dependence, cigarettes, uncomplicated: Secondary | ICD-10-CM | POA: Insufficient documentation

## 2017-07-23 DIAGNOSIS — R1031 Right lower quadrant pain: Secondary | ICD-10-CM | POA: Diagnosis present

## 2017-07-23 DIAGNOSIS — E119 Type 2 diabetes mellitus without complications: Secondary | ICD-10-CM | POA: Insufficient documentation

## 2017-07-23 DIAGNOSIS — Z79899 Other long term (current) drug therapy: Secondary | ICD-10-CM | POA: Diagnosis not present

## 2017-07-23 LAB — COMPREHENSIVE METABOLIC PANEL
ALBUMIN: 3.9 g/dL (ref 3.5–5.0)
ALT: 11 U/L — ABNORMAL LOW (ref 14–54)
AST: 17 U/L (ref 15–41)
Alkaline Phosphatase: 75 U/L (ref 38–126)
Anion gap: 8 (ref 5–15)
BUN: 17 mg/dL (ref 6–20)
CO2: 25 mmol/L (ref 22–32)
Calcium: 9.4 mg/dL (ref 8.9–10.3)
Chloride: 106 mmol/L (ref 101–111)
Creatinine, Ser: 0.77 mg/dL (ref 0.44–1.00)
GFR calc Af Amer: >60 mL/min (ref >60)
GFR calc non Af Amer: >60 mL/min (ref >60)
Glucose, Bld: 92 mg/dL (ref 65–99)
POTASSIUM: 3.4 mmol/L — AB (ref 3.5–5.1)
SODIUM: 139 mmol/L (ref 135–145)
Total Bilirubin: 1 mg/dL (ref 0.3–1.2)
Total Protein: 7.7 g/dL (ref 6.5–8.1)

## 2017-07-23 LAB — URINALYSIS, COMPLETE (UACMP) WITH MICROSCOPIC
BACTERIA UA: NONE SEEN
Bilirubin Urine: NEGATIVE
Glucose, UA: NEGATIVE mg/dL
Hgb urine dipstick: NEGATIVE
KETONES UR: NEGATIVE mg/dL
Leukocytes, UA: NEGATIVE
NITRITE: NEGATIVE
PROTEIN: NEGATIVE mg/dL
Specific Gravity, Urine: 1.027 (ref 1.005–1.030)
pH: 5 (ref 5.0–8.0)

## 2017-07-23 LAB — CBC
HEMATOCRIT: 39.8 % (ref 35.0–47.0)
HEMOGLOBIN: 13.1 g/dL (ref 12.0–16.0)
MCH: 28.5 pg (ref 26.0–34.0)
MCHC: 33 g/dL (ref 32.0–36.0)
MCV: 86.6 fL (ref 80.0–100.0)
Platelets: 304 10*3/uL (ref 150–440)
RBC: 4.6 MIL/uL (ref 3.80–5.20)
RDW: 13.8 % (ref 11.5–14.5)
WBC: 4.8 10*3/uL (ref 3.6–11.0)

## 2017-07-23 LAB — CHLAMYDIA/NGC RT PCR (ARMC ONLY)
CHLAMYDIA TR: NOT DETECTED
N gonorrhoeae: NOT DETECTED

## 2017-07-23 LAB — WET PREP, GENITAL
CLUE CELLS WET PREP: NONE SEEN
Sperm: NONE SEEN
Trich, Wet Prep: NONE SEEN
WBC, Wet Prep HPF POC: NONE SEEN
Yeast Wet Prep HPF POC: NONE SEEN

## 2017-07-23 LAB — POCT PREGNANCY, URINE: Preg Test, Ur: NEGATIVE

## 2017-07-23 LAB — LIPASE, BLOOD: Lipase: 18 U/L (ref 11–51)

## 2017-07-23 MED ORDER — TRAMADOL HCL 50 MG PO TABS
ORAL_TABLET | ORAL | Status: AC
  Filled 2017-07-23: qty 2

## 2017-07-23 MED ORDER — MORPHINE SULFATE (PF) 4 MG/ML IV SOLN
INTRAVENOUS | Status: AC
  Administered 2017-07-23: 4 mg
  Filled 2017-07-23: qty 1

## 2017-07-23 MED ORDER — IOPAMIDOL (ISOVUE-300) INJECTION 61%
30.0000 mL | Freq: Once | INTRAVENOUS | Status: AC
  Administered 2017-07-23: 30 mL via ORAL

## 2017-07-23 MED ORDER — MORPHINE SULFATE (PF) 4 MG/ML IV SOLN
4.0000 mg | Freq: Once | INTRAVENOUS | Status: AC
  Administered 2017-07-23: 4 mg via INTRAVENOUS
  Filled 2017-07-23: qty 1

## 2017-07-23 MED ORDER — ONDANSETRON HCL 4 MG/2ML IJ SOLN
4.0000 mg | Freq: Once | INTRAMUSCULAR | Status: AC
Start: 2017-07-23 — End: 2017-07-23
  Administered 2017-07-23: 4 mg via INTRAVENOUS
  Filled 2017-07-23: qty 2

## 2017-07-23 MED ORDER — IOPAMIDOL (ISOVUE-300) INJECTION 61%: 15.0000 mL | INTRAVENOUS | Status: DC

## 2017-07-23 MED ORDER — IOPAMIDOL (ISOVUE-300) INJECTION 61%
100.0000 mL | Freq: Once | INTRAVENOUS | Status: AC | PRN
  Administered 2017-07-23: 100 mL via INTRAVENOUS

## 2017-07-23 NOTE — ED Notes (Signed)
Pt returned from US at this time.

## 2017-07-23 NOTE — ED Notes (Signed)
Patient transported to CT 

## 2017-07-23 NOTE — ED Notes (Signed)
Pt asked for pain medications, went to give her two tramadol, pt refused wants something stronger. She is also asking for crackers and apple juice. Explained we have to wait for US results before she can have anything to eat or drink.

## 2017-07-23 NOTE — ED Triage Notes (Signed)
Pt ambulatory to front desk c/o right hip pain x 1 week. Pt denies any injury to the area. Pt in NAD at this time.

## 2017-07-23 NOTE — Discharge Instructions (Signed)
You are evaluated for right lower abdominal pain, and although no certain cause was found, your exam and evaluation are overall reassuring in the your ultrasound and CT scan showed enlarged veins which may be associated with "pelvic congestion syndrome." You should make a follow-up appointment with the OB/GYN as well as your primary care doctor.  Return to the ER for any new or worsening pain, black or bloody stool, fevers, or any other symptoms concerning to you.

## 2017-07-23 NOTE — ED Notes (Signed)
Patient transported to Ultrasound 

## 2017-07-23 NOTE — ED Provider Notes (Signed)
[facility] Emergency Department Provider Note ____________________________________________   I have reviewed the triage vital signs and the triage nursing note.  HISTORY  Chief Complaint Abdominal Pain   Historian Patient  HPI Karina Anderson is a 46 y.o. female reports history of prior ovarian cyst, presents for evaluation to the ED due to right lower quadrant pain.  It sounds like it started yesterday, fairly constant.  Nothing makes it worse or better.  Moderate.  Denies frequency of urination but states she has dark urine without blood.  No back pain.  She has a mild vaginal discharge.  No vaginal bleeding.  Reports she had constipation yesterday and took a laxative.  No chest pain or coughing or trouble breathing.  No fevers.   Past Medical History:  Diagnosis Date  . Diabetes mellitus without complication (HCC)   . Seizures [facility])     Patient Active Problem List   Diagnosis Date Noted  . Tobacco use disorder 03/13/2016  . Cannabis use disorder, moderate, dependence (HCC) 03/13/2016  . Bipolar affective disorder, manic, severe, with psychotic behavior (HCC) 03/12/2016  . Seizures (HCC) 03/11/2016  . Involuntary commitment 03/11/2016    History reviewed. No pertinent surgical history.  Prior to Admission medications   Medication Sig Start Date End Date Taking? Authorizing Provider  doxylamine, Sleep, (UNISOM) 25 MG tablet Take 50 mg by mouth at bedtime as needed.   Yes [provider]  hydrOXYzine (ATARAX/VISTARIL) 50 MG tablet Take 1 tablet (50 mg total) by mouth every 6 (six) hours as needed for anxiety. Patient not taking: Reported on 07/23/2017 03/29/16   Pucilowska, Ellin Goodie, MD  phenazopyridine (PYRIDIUM) 200 MG tablet Take 1 tablet (200 mg total) by mouth 3 (three) times daily as needed for pain. Patient not taking: Reported on 07/23/2017 05/19/16   Menshew, Charlesetta Ivory, PA-C  phenytoin (DILANTIN) 100 MG ER capsule Take 1 capsule  (100 mg total) by mouth 3 (three) times daily. Patient not taking: Reported on 07/23/2017 03/29/16   Pucilowska, Ellin Goodie, MD  QUEtiapine (SEROQUEL) 400 MG tablet Take 2 tablets (800 mg total) by mouth at bedtime. Patient not taking: Reported on 07/23/2017 03/29/16   Pucilowska, Ellin Goodie, MD  traMADol (ULTRAM) 50 MG tablet Take 1 tablet (50 mg total) by mouth 2 (two) times daily. Patient not taking: Reported on 07/23/2017 02/28/17   Menshew, Charlesetta Ivory, PA-C    Allergies  Allergen Reactions  . Bactrim [Sulfamethoxazole-Trimethoprim] Hives  . Butorphanol Tartrate   . Food Other (See Comments)    Allergic to Citrus Fruits.  . Ibuprofen   . Ketorolac   . Lorazepam   . Naproxen   . Nsaids   . Prochlorperazine     No family history on file.  Social History Social History   Tobacco Use  . Smoking status: Current Every Day Smoker    Packs/day: 0.50    Types: Cigarettes  . Smokeless tobacco: Never Used  Substance Use Topics  . Alcohol use: No  . Drug use: No    Review of Systems  Constitutional: Negative for fever. Eyes: Negative for visual changes. ENT: Negative for sore throat. Cardiovascular: Negative for chest pain. Respiratory: Negative for shortness of breath. Gastrointestinal: Negative for vomiting and diarrhea. Genitourinary: Negative for dysuria. Musculoskeletal: Negative for back pain. Skin: Negative for rash. Neurological: Negative for headache.  ____________________________________________   PHYSICAL EXAM:  VITAL SIGNS: ED Triage Vitals  Enc Vitals Group     BP 07/23/17 0836 117/75  Pulse Rate 07/23/17 0836 (!) 101     Resp 07/23/17 0836 16     Temp 07/23/17 0836 98.4 F (36.9 C)     Temp Source 07/23/17 0836 Oral     SpO2 07/23/17 0836 100 %     Weight --      Height --      Head Circumference --      Peak Flow --      Pain Score 07/23/17 0834 10     Pain Loc --      Pain Edu? --      Excl. in GC? --      Constitutional: Alert and  oriented. Well appearing and in no distress. HEENT   Head: Normocephalic and atraumatic.      Eyes: Conjunctivae are normal. Pupils equal and round.       Ears:         Nose: No congestion/rhinnorhea.   Mouth/Throat: Mucous membranes are moist.   Neck: No stridor. Cardiovascular/Chest: Normal rate, regular rhythm.  No murmurs, rubs, or gallops. Respiratory: Normal respiratory effort without tachypnea nor retractions. Breath sounds are clear and equal bilaterally. No wheezes/rales/rhonchi. Gastrointestinal: Soft. No distention, no guarding, no rebound.  Moderate tenderness right lower quadrant.  Otherwise upper abdomen nontender palpation left side abdomen nontender to palpation. Genitourinary/rectal: No vaginal bleeding.  Small vaginal discharge, no cervicitis or adnexal mass or pain. Musculoskeletal: Nontender with normal range of motion in all extremities. No joint effusions.  No lower extremity tenderness.  No edema. Neurologic:  Normal speech and language. No gross or focal neurologic deficits are appreciated. Skin:  Skin is warm, dry and intact. No rash noted. Psychiatric: Mood and affect are normal. Speech and behavior are normal. Patient exhibits appropriate insight and judgment.   ____________________________________________  LABS (pertinent positives/negatives) I, Governor Rooksebecca Tali Coster, MD the attending physician have reviewed the labs noted below.  Labs Reviewed  COMPREHENSIVE METABOLIC PANEL - Abnormal; Notable for the following components:      Result Value   Potassium 3.4 (*)    ALT 11 (*)    All other components within normal limits  URINALYSIS, COMPLETE (UACMP) WITH MICROSCOPIC - Abnormal; Notable for the following components:   Color, Urine YELLOW (*)    APPearance CLOUDY (*)    Squamous Epithelial / LPF 6-30 (*)    All other components within normal limits  CHLAMYDIA/NGC RT PCR ([facility] ONLY)  WET PREP, GENITAL  LIPASE, BLOOD  CBC  POC URINE PREG, ED   POCT PREGNANCY, URINE    ____________________________________________    EKG I, Governor Rooksebecca Aero Drummonds, MD, the attending physician have personally viewed and interpreted all ECGs.  None ____________________________________________  RADIOLOGY All Xrays were viewed by me.  Imaging interpreted by Radiologist, and I, Governor Rooksebecca Ivanna Kocak, MD the attending physician have reviewed the radiologist interpretation noted below.  Ultrasound pelvic and transvaginal:  IMPRESSION: Uterus and ovaries appear normal. Enlarged pelvic varices are noted in the left adnexal region suggesting pelvic congestion syndrome.  CT abd pel:  IMPRESSION: 1. No acute findings are noted in the abdomen or pelvis to account for the patient's symptoms. 2. The appendix is not confidently identified may be surgically absent. Regardless, there are no inflammatory changes noted adjacent to the cecum to suggest the presence of an acute appendicitis at this time. 3. Marked prominence of the left gonadal vein, markedly asymmetric with the contralateral side. This is nonspecific, but can be seen in the setting of pelvic congestion syndrome. __________________________________________  PROCEDURES  Procedure(s) performed: None  Critical Care performed: None   ____________________________________________  ED COURSE / ASSESSMENT AND PLAN  Pertinent labs & imaging results that were available during my care of the patient were reviewed by me and considered in my medical decision making (see chart for details).   Patient is overall well-appearing but complaining of right lower quadrant pain.  She states it feels similar to prior ovarian cyst.  Laboratory studies are reassuring with no elevated white blood cell count, not anemic.  Ultrasound without torsion or cyst, there was indicators for possibly increased pelvic varices.  I will refer her to outpatient nonemergent follow-up with OB/GYN.  I discussed with patient risk versus  benefit of obtaining CT to rule out appendicitis given persistent right lower quadrant pain, although despite reassuring laboratory studies.  She wanted to proceed, this is reasonable.  CT scan which was overall reassuring with no findings for acute mental status or other cause for her acute pain.  Again it showed findings of pelvic congestion, I again am not sure if this could be giving her her right lower quadrant pain in any case I am to have her recommended to follow with OB/GYN as well as her primary care doctor.  DIFFERENTIAL DIAGNOSIS: Differential diagnosis includes, but is not limited to, ovarian cyst, ovarian torsion, acute appendicitis, diverticulitis, urinary tract infection/pyelonephritis, endometriosis, bowel obstruction, colitis, renal colic, gastroenteritis, hernia, fibroids, endometriosis, pregnancy related pain including ectopic pregnancy, etc.  CONSULTATIONS:  None   Patient / Family / Caregiver informed of clinical course, medical decision-making process, and agree with plan.   I discussed return precautions, follow-up instructions, and discharge instructions with patient and/or family.  Discharge Instructions : You are evaluated for right lower abdominal pain, and although no certain cause was found, your exam and evaluation are overall reassuring in the your ultrasound and CT scan showed enlarged veins which may be associated with "pelvic congestion syndrome." You should make a follow-up appointment with the OB/GYN as well as your primary care doctor.  Return to the ER for any new or worsening pain, black or bloody stool, fevers, or any other symptoms concerning to you.    ___________________________________________   FINAL CLINICAL IMPRESSION(S) / ED DIAGNOSES   Final diagnoses:  Pelvic congestion syndrome  RLQ abdominal pain      ___________________________________________  ED Discharge Orders    None            Note: This dictation was prepared  with Dragon dictation. Any transcriptional errors that result from this process are unintentional    Governor RooksLord, Shyna Duignan, MD 07/23/17 1458

## 2017-07-23 NOTE — ED Notes (Signed)
Pt back from CT at this time 

## 2017-07-23 NOTE — ED Provider Notes (Signed)
    Tommi RumpsSummers, Sohil Timko L, PA-C 07/27/17 1538    Dionne BucySiadecki, Sebastian, MD 07/29/17 1534

## 2017-07-23 NOTE — ED Notes (Signed)
Pt vomited all over floor. Clear emesis.

## 2017-09-25 DIAGNOSIS — G5602 Carpal tunnel syndrome, left upper limb: Secondary | ICD-10-CM | POA: Diagnosis not present

## 2017-09-27 ENCOUNTER — Encounter: Payer: Self-pay | Admitting: Emergency Medicine

## 2017-09-27 ENCOUNTER — Emergency Department
Admission: EM | Admit: 2017-09-27 | Discharge: 2017-09-27 | Disposition: A | Discharge status: Home / Self Care | Payer: Medicaid Other | Attending: Emergency Medicine | Admitting: Emergency Medicine

## 2017-09-27 ENCOUNTER — Emergency Department: Payer: Medicaid Other

## 2017-09-27 DIAGNOSIS — R1013 Epigastric pain: Secondary | ICD-10-CM | POA: Diagnosis not present

## 2017-09-27 DIAGNOSIS — E119 Type 2 diabetes mellitus without complications: Secondary | ICD-10-CM | POA: Diagnosis not present

## 2017-09-27 DIAGNOSIS — F1220 Cannabis dependence, uncomplicated: Secondary | ICD-10-CM | POA: Insufficient documentation

## 2017-09-27 DIAGNOSIS — F17210 Nicotine dependence, cigarettes, uncomplicated: Secondary | ICD-10-CM | POA: Insufficient documentation

## 2017-09-27 DIAGNOSIS — F319 Bipolar disorder, unspecified: Secondary | ICD-10-CM | POA: Diagnosis not present

## 2017-09-27 DIAGNOSIS — R1010 Upper abdominal pain, unspecified: Secondary | ICD-10-CM | POA: Diagnosis present

## 2017-09-27 DIAGNOSIS — R079 Chest pain, unspecified: Secondary | ICD-10-CM | POA: Diagnosis not present

## 2017-09-27 LAB — CBC
HCT: 37.1 % (ref 35.0–47.0)
Hemoglobin: 12.3 g/dL (ref 12.0–16.0)
MCH: 28.7 pg (ref 26.0–34.0)
MCHC: 33.1 g/dL (ref 32.0–36.0)
MCV: 86.8 fL (ref 80.0–100.0)
PLATELETS: 315 10*3/uL (ref 150–440)
RBC: 4.28 MIL/uL (ref 3.80–5.20)
RDW: 15 % — ABNORMAL HIGH (ref 11.5–14.5)
WBC: 7.2 10*3/uL (ref 3.6–11.0)

## 2017-09-27 LAB — HEPATIC FUNCTION PANEL
ALBUMIN: 3.7 g/dL (ref 3.5–5.0)
ALK PHOS: 67 U/L (ref 38–126)
ALT: 37 U/L (ref 14–54)
AST: 34 U/L (ref 15–41)
Bilirubin, Direct: <0.1 mg/dL — ABNORMAL LOW (ref 0.1–0.5)
TOTAL PROTEIN: 7.4 g/dL (ref 6.5–8.1)
Total Bilirubin: 0.6 mg/dL (ref 0.3–1.2)

## 2017-09-27 LAB — URINALYSIS, COMPLETE (UACMP) WITH MICROSCOPIC
Bacteria, UA: NONE SEEN
Bilirubin Urine: NEGATIVE
Glucose, UA: NEGATIVE mg/dL
Hgb urine dipstick: NEGATIVE
KETONES UR: NEGATIVE mg/dL
Leukocytes, UA: NEGATIVE
Nitrite: NEGATIVE
PH: 6 (ref 5.0–8.0)
Protein, ur: NEGATIVE mg/dL
Specific Gravity, Urine: 1.024 (ref 1.005–1.030)

## 2017-09-27 LAB — BASIC METABOLIC PANEL
Anion gap: 7 (ref 5–15)
BUN: 19 mg/dL (ref 6–20)
CALCIUM: 8.9 mg/dL (ref 8.9–10.3)
CO2: 25 mmol/L (ref 22–32)
CREATININE: 0.73 mg/dL (ref 0.44–1.00)
Chloride: 104 mmol/L (ref 101–111)
GFR calc Af Amer: >60 mL/min (ref >60)
GFR calc non Af Amer: >60 mL/min (ref >60)
GLUCOSE: 104 mg/dL — AB (ref 65–99)
Potassium: 3.4 mmol/L — ABNORMAL LOW (ref 3.5–5.1)
Sodium: 136 mmol/L (ref 135–145)

## 2017-09-27 LAB — LIPASE, BLOOD: Lipase: 22 U/L (ref 11–51)

## 2017-09-27 LAB — POCT PREGNANCY, URINE: Preg Test, Ur: NEGATIVE

## 2017-09-27 LAB — TROPONIN I: Troponin I: <0.03 ng/mL (ref <0.03)

## 2017-09-27 MED ORDER — SODIUM CHLORIDE 0.9 % IV BOLUS (SEPSIS)
1000.0000 mL | Freq: Once | INTRAVENOUS | Status: AC
  Administered 2017-09-27: 1000 mL via INTRAVENOUS

## 2017-09-27 MED ORDER — DICYCLOMINE HCL 20 MG PO TABS: 20.0000 mg | ORAL_TABLET | Freq: Three times a day (TID) | ORAL | 0 refills | Status: DC | PRN

## 2017-09-27 MED ORDER — ONDANSETRON HCL 4 MG/2ML IJ SOLN
4.0000 mg | Freq: Once | INTRAMUSCULAR | Status: AC
  Administered 2017-09-27: 4 mg via INTRAVENOUS
  Filled 2017-09-27: qty 2

## 2017-09-27 MED ORDER — MORPHINE SULFATE (PF) 4 MG/ML IV SOLN
4.0000 mg | Freq: Once | INTRAVENOUS | Status: AC
  Administered 2017-09-27: 4 mg via INTRAVENOUS
  Filled 2017-09-27: qty 1

## 2017-09-27 MED ORDER — FAMOTIDINE 40 MG PO TABS: 40.0000 mg | ORAL_TABLET | Freq: Every evening | ORAL | 0 refills | Status: DC

## 2017-09-27 NOTE — ED Triage Notes (Signed)
Pt to ED with c/o of chest pain between breasts that radiates to back. Pt states nausea but no vomiting. Pt states has been ongoing for 3 weeks but increased this morning.

## 2017-09-27 NOTE — ED Notes (Signed)
Urine Preg POCT was negative.

## 2017-09-27 NOTE — ED Provider Notes (Addendum)
[facility] Emergency Department Provider Note  ____________________________________________   First MD Initiated Contact with Patient 09/27/17 1042     (approximate)  I have reviewed the triage vital signs and the nursing notes.   HISTORY  Chief Complaint Chest Pain   HPI Karina Anderson is a 47 y.o. female with a history of diabetes as well as a seizure disorder who is presenting to the emergency department with 3 weeks of upper abdominal pain.  Says that the pain is sharp and a 10 out of 10 at this time.  Says that it radiates through to her back.  She denies drinking or drug use.  Says that she has been nauseous but has not had any vomiting.  Denies any diarrhea.  Says that the pain is more to the upper abdomen into the lower chest.  Says that she has been intermittently short of breath.  However, she says the pain is been constant.  She says that it is worsening over the past 3 weeks which is why she presented to the emergency department today for further evaluation.  She denies any heavy lifting or injury.   Past Medical History:  Diagnosis Date  . Diabetes mellitus without complication (HCC)   . Seizures [facility])     Patient Active Problem List   Diagnosis Date Noted  . Tobacco use disorder 03/13/2016  . Cannabis use disorder, moderate, dependence (HCC) 03/13/2016  . Bipolar affective disorder, manic, severe, with psychotic behavior (HCC) 03/12/2016  . Seizures (HCC) 03/11/2016  . Involuntary commitment 03/11/2016    History reviewed. No pertinent surgical history.  Prior to Admission medications   Medication Sig Start Date End Date Taking? Authorizing Provider  doxylamine, Sleep, (UNISOM) 25 MG tablet Take 50 mg by mouth at bedtime as needed.    [provider]  hydrOXYzine (ATARAX/VISTARIL) 50 MG tablet Take 1 tablet (50 mg total) by mouth every 6 (six) hours as needed for anxiety. Patient not taking: Reported on 07/23/2017 03/29/16    Pucilowska, Ellin Goodie, MD  phenazopyridine (PYRIDIUM) 200 MG tablet Take 1 tablet (200 mg total) by mouth 3 (three) times daily as needed for pain. Patient not taking: Reported on 07/23/2017 05/19/16   Menshew, Charlesetta Ivory, PA-C  phenytoin (DILANTIN) 100 MG ER capsule Take 1 capsule (100 mg total) by mouth 3 (three) times daily. Patient not taking: Reported on 07/23/2017 03/29/16   Pucilowska, Ellin Goodie, MD  QUEtiapine (SEROQUEL) 400 MG tablet Take 2 tablets (800 mg total) by mouth at bedtime. Patient not taking: Reported on 07/23/2017 03/29/16   Pucilowska, Ellin Goodie, MD  traMADol (ULTRAM) 50 MG tablet Take 1 tablet (50 mg total) by mouth 2 (two) times daily. Patient not taking: Reported on 07/23/2017 02/28/17   Menshew, Charlesetta Ivory, PA-C    Allergies Bactrim [sulfamethoxazole-trimethoprim]; Butorphanol tartrate; Food; Ibuprofen; Ketorolac; Lorazepam; Naproxen; Nsaids; and Prochlorperazine  History reviewed. No pertinent family history.  Social History Social History   Tobacco Use  . Smoking status: Current Every Day Smoker    Packs/day: 0.50    Types: Cigarettes  . Smokeless tobacco: Never Used  Substance Use Topics  . Alcohol use: No  . Drug use: No    Review of Systems  Constitutional: No fever/chills Eyes: No visual changes. ENT: No sore throat. Cardiovascular: As above Respiratory: As above Gastrointestinal: no vomiting.  No diarrhea.  No constipation. Genitourinary: Negative for dysuria. Musculoskeletal: Negative for back pain. Skin: Negative for rash. Neurological: Negative for headaches, focal  weakness or numbness.   ____________________________________________   PHYSICAL EXAM:  VITAL SIGNS: ED Triage Vitals  Enc Vitals Group     BP 09/27/17 1027 (!) 156/101     Pulse Rate 09/27/17 1027 89     Resp --      Temp 09/27/17 1027 98.3 F (36.8 C)     Temp Source 09/27/17 1027 Oral     SpO2 09/27/17 1027 100 %     Weight 09/27/17 1019 183 lb (83 kg)      Height 09/27/17 1019 5\' 5"  (1.651 m)     Head Circumference --      Peak Flow --      Pain Score 09/27/17 1018 10     Pain Loc --      Pain Edu? --      Excl. in GC? --     Constitutional: Alert and oriented. Well appearing and in no acute distress. Eyes: Conjunctivae are normal.  Head: Atraumatic. Nose: No congestion/rhinnorhea. Mouth/Throat: Mucous membranes are moist.  Neck: No stridor.   Cardiovascular: Normal rate, regular rhythm. Grossly normal heart sounds.  Mild tenderness to palpation over the distal sternum. Respiratory: Normal respiratory effort.  No retractions. Lungs CTAB. Gastrointestinal: Soft with mild epigastric tenderness without any right upper quadrant tenderness to palpation. No distention. No CVA tenderness. Musculoskeletal: No lower extremity tenderness nor edema.  No joint effusions. Neurologic:  Normal speech and language. No gross focal neurologic deficits are appreciated. Skin:  Skin is warm, dry and intact. No rash noted. Psychiatric: Mood and affect are normal. Speech and behavior are normal.  ____________________________________________   LABS (all labs ordered are listed, but only abnormal results are displayed)  Labs Reviewed  BASIC METABOLIC PANEL - Abnormal; Notable for the following components:      Result Value   Potassium 3.4 (*)    Glucose, Bld 104 (*)    All other components within normal limits  CBC - Abnormal; Notable for the following components:   RDW 15.0 (*)    All other components within normal limits  URINALYSIS, COMPLETE (UACMP) WITH MICROSCOPIC - Abnormal; Notable for the following components:   Color, Urine YELLOW (*)    APPearance HAZY (*)    Squamous Epithelial / LPF 0-5 (*)    All other components within normal limits  HEPATIC FUNCTION PANEL - Abnormal; Notable for the following components:   Bilirubin, Direct <0.1 (*)    All other components within normal limits  TROPONIN I  LIPASE, BLOOD  POC URINE PREG, ED    ____________________________________________  EKG  ED ECG REPORT I, Arelia Longest, the attending physician, personally viewed and interpreted this ECG.   Date: 09/27/2017  EKG Time: 1024  Rate: 82  Rhythm: normal sinus rhythm  Axis: Normal  Intervals:none  ST&T Change: No ST segment elevation or depression.  No abnormal T wave inversion.  ____________________________________________  RADIOLOGY No acute finding on the chest x-ray  ____________________________________________   PROCEDURES  Procedure(s) performed:   Procedures  Critical Care performed:   ____________________________________________   INITIAL IMPRESSION / ASSESSMENT AND PLAN / ED COURSE  Pertinent labs & imaging results that were available during my care of the patient were reviewed by me and considered in my medical decision making (see chart for details).  Differential diagnosis includes, but is not limited to, biliary disease (biliary colic, acute cholecystitis, cholangitis, choledocholithiasis, etc), intrathoracic causes for epigastric abdominal pain including ACS, gastritis, duodenitis, pancreatitis, small bowel or large bowel obstruction, abdominal  aortic aneurysm, hernia, and gastritis. Differential diagnosis includes, but is not limited to, ACS, aortic dissection, pulmonary embolism, cardiac tamponade, pneumothorax, pneumonia, pericarditis, myocarditis, GI-related causes including esophagitis/gastritis, and musculoskeletal chest wall pain.   As part of my medical decision making, I reviewed the following data within the electronic MEDICAL RECORD NUMBER Notes from prior ED visits  ----------------------------------------- 1:30 PM on 09/27/2017 -----------------------------------------  Patient does not report any relief of the pain with morphine.  However, she is without any distress.  Very minimal to no tenderness to palpation at this time to the upper abdomen.  Patient is continuously asking for  food and a sandwich.  She has not had any vomiting here in the emergency department.  Her overall workup including labs as well as imaging and exam have been reassuring.  We discussed follow-up with primary care as well as trying a course of Bentyl as well as an acid reducer.  She is understanding of this plan willing to comply people be discharged at this time. PERC negative.  Unlikely to be cardiac etiology after several weeks with a negative workup.      ____________________________________________   FINAL CLINICAL IMPRESSION(S) / ED DIAGNOSES   Epigastric pain.   NEW MEDICATIONS STARTED DURING THIS VISIT:  New Prescriptions   No medications on file     Note:  This document was prepared using Dragon voice recognition software and may include unintentional dictation errors.     Myrna BlazerSchaevitz, Cregg Jutte Matthew, MD 09/27/17 1331    Jersee Winiarski, Myra Rudeavid Matthew, MD 09/27/17 [phone]

## 2017-09-27 NOTE — ED Notes (Signed)
FIRST NURSE NOTE: pt comes into the ED via EMS from home with c/o LUQ pain that radiates around to her back with nausea for the past 2 weeks. States she has a hx of seizures and is suppose to be on keppra but ran out of her meds a while ago. Pt is in NAD on arrival...Marland Kitchen

## 2017-09-29 ENCOUNTER — Telehealth: Payer: Self-pay

## 2017-09-29 NOTE — Telephone Encounter (Signed)
Lm with patient father to have patient call us   She was seen in ED on 09/27/17  She will need a follow up appointment   Will try again at a later time

## 2017-10-03 NOTE — Telephone Encounter (Signed)
l msg with father to have pt call and schedule ED f/u

## 2017-10-05 NOTE — Telephone Encounter (Addendum)
lmov to call office for an appt mailed letter

## 2017-10-21 ENCOUNTER — Emergency Department
Admission: EM | Admit: 2017-10-21 | Discharge: 2017-10-21 | Disposition: A | Discharge status: Home / Self Care | Payer: Medicaid Other | Attending: Emergency Medicine | Admitting: Emergency Medicine

## 2017-10-21 ENCOUNTER — Other Ambulatory Visit: Payer: Self-pay

## 2017-10-21 ENCOUNTER — Encounter: Payer: Self-pay | Admitting: Emergency Medicine

## 2017-10-21 DIAGNOSIS — L089 Local infection of the skin and subcutaneous tissue, unspecified: Secondary | ICD-10-CM

## 2017-10-21 DIAGNOSIS — E119 Type 2 diabetes mellitus without complications: Secondary | ICD-10-CM | POA: Diagnosis not present

## 2017-10-21 DIAGNOSIS — Z79899 Other long term (current) drug therapy: Secondary | ICD-10-CM | POA: Diagnosis not present

## 2017-10-21 DIAGNOSIS — F17210 Nicotine dependence, cigarettes, uncomplicated: Secondary | ICD-10-CM | POA: Diagnosis not present

## 2017-10-21 DIAGNOSIS — R2241 Localized swelling, mass and lump, right lower limb: Secondary | ICD-10-CM | POA: Diagnosis present

## 2017-10-21 HISTORY — DX: Hidradenitis suppurativa: L73.2

## 2017-10-21 MED ORDER — TRAMADOL HCL 50 MG PO TABS: 50.0000 mg | ORAL_TABLET | Freq: Four times a day (QID) | ORAL | 0 refills | Status: DC | PRN

## 2017-10-21 MED ORDER — CLINDAMYCIN HCL 300 MG PO CAPS: 300.0000 mg | ORAL_CAPSULE | Freq: Four times a day (QID) | ORAL | 0 refills | Status: AC

## 2017-10-21 NOTE — ED Notes (Signed)
Pt given discharge instructions with f/u to her dr.

## 2017-10-21 NOTE — ED Provider Notes (Signed)
[facility] Emergency Department Provider Note  ____________________________________________  Time seen: Approximately 1:51 PM  I have reviewed the triage vital signs and the nursing notes.   HISTORY  Chief Complaint Cyst    HPI Karina Anderson is a 47 y.o. female that presents to the  emergency department for evaluation of painful mass to right groin for 2 days.  No drainage. Patient has a history of abscesses but usually gets them under her arms.  No fevers, chills, nausea, vomiting.   Past Medical History:  Diagnosis Date  . Diabetes mellitus without complication (HCC)   . Hydradenitis   . Seizures [facility])     Patient Active Problem List   Diagnosis Date Noted  . Tobacco use disorder 03/13/2016  . Cannabis use disorder, moderate, dependence (HCC) 03/13/2016  . Bipolar affective disorder, manic, severe, with psychotic behavior (HCC) 03/12/2016  . Seizures (HCC) 03/11/2016  . Involuntary commitment 03/11/2016    History reviewed. No pertinent surgical history.  Prior to Admission medications   Medication Sig Start Date End Date Taking? Authorizing Provider  clindamycin (CLEOCIN) 300 MG capsule Take 1 capsule (300 mg total) by mouth 4 (four) times daily for 10 days. 10/21/17 10/31/17  Enid Derry, PA-C  dicyclomine (BENTYL) 20 MG tablet Take 1 tablet (20 mg total) by mouth 3 (three) times daily as needed for spasms. 09/27/17 09/27/18  Myrna Blazer, MD  doxylamine, Sleep, (UNISOM) 25 MG tablet Take 50 mg by mouth at bedtime as needed.    [provider]  famotidine (PEPCID) 40 MG tablet Take 1 tablet (40 mg total) by mouth every evening. 09/27/17 09/27/18  Myrna Blazer, MD  hydrOXYzine (ATARAX/VISTARIL) 50 MG tablet Take 1 tablet (50 mg total) by mouth every 6 (six) hours as needed for anxiety. Patient not taking: Reported on 07/23/2017 03/29/16   Pucilowska, Ellin Goodie, MD  phenazopyridine (PYRIDIUM) 200 MG tablet Take 1 tablet  (200 mg total) by mouth 3 (three) times daily as needed for pain. Patient not taking: Reported on 07/23/2017 05/19/16   Menshew, Charlesetta Ivory, PA-C  phenytoin (DILANTIN) 100 MG ER capsule Take 1 capsule (100 mg total) by mouth 3 (three) times daily. Patient not taking: Reported on 07/23/2017 03/29/16   Pucilowska, Ellin Goodie, MD  QUEtiapine (SEROQUEL) 400 MG tablet Take 2 tablets (800 mg total) by mouth at bedtime. Patient not taking: Reported on 07/23/2017 03/29/16   Pucilowska, Ellin Goodie, MD  traMADol (ULTRAM) 50 MG tablet Take 1 tablet (50 mg total) by mouth every 6 (six) hours as needed. 10/21/17 10/21/18  Enid Derry, PA-C    Allergies Bactrim [sulfamethoxazole-trimethoprim]; Butorphanol tartrate; Food; Ibuprofen; Ketorolac; Lorazepam; Naproxen; Prochlorperazine; and Nsaids  History reviewed. No pertinent family history.  Social History Social History   Tobacco Use  . Smoking status: Current Every Day Smoker    Packs/day: 0.50    Types: Cigarettes  . Smokeless tobacco: Never Used  Substance Use Topics  . Alcohol use: No  . Drug use: No     Review of Systems  Constitutional: No fever/chills. Cardiovascular: No chest pain. Respiratory: No SOB. Gastrointestinal: No abdominal pain.  No nausea, no vomiting.   Musculoskeletal: Negative for musculoskeletal pain. Skin: Negative for abrasions, lacerations, ecchymosis.   ____________________________________________   PHYSICAL EXAM:  VITAL SIGNS: ED Triage Vitals  Enc Vitals Group     BP 10/21/17 1215 107/79     Pulse Rate 10/21/17 1215 100     Resp 10/21/17 1215 20  Temp 10/21/17 1215 98.5 F (36.9 C)     Temp Source 10/21/17 1215 Oral     SpO2 10/21/17 1215 100 %     Weight 10/21/17 1215 183 lb (83 kg)     Height 10/21/17 1215 5\' 5"  (1.651 m)     Head Circumference --      Peak Flow --      Pain Score 10/21/17 1216 10     Pain Loc --      Pain Edu? --      Excl. in GC? --      Constitutional: Alert and  oriented. Well appearing and in no acute distress. Eyes: Conjunctivae are normal. PERRL. EOMI. Head: Atraumatic. ENT:      Ears:      Nose: No congestion/rhinnorhea.      Mouth/Throat: Mucous membranes are moist.  Neck: No stridor.  Cardiovascular: Normal rate, regular rhythm.  Good peripheral circulation. Respiratory: Normal respiratory effort without tachypnea or retractions. Lungs CTAB. Good air entry to the bases with no decreased or absent breath sounds. Gastrointestinal: Bowel sounds 4 quadrants. Soft and nontender to palpation. No guarding or rigidity. No palpable masses. No distention. No Musculoskeletal: Full range of motion to all extremities. No gross deformities appreciated. Neurologic:  Normal speech and language. No gross focal neurologic deficits are appreciated.  Skin:  Skin is warm, dry.  1 cm x 1 cm area of induration to right groin.  No fluctuance.  No surrounding erythema.   ____________________________________________   LABS (all labs ordered are listed, but only abnormal results are displayed)  Labs Reviewed - No data to display ____________________________________________  EKG   ____________________________________________  RADIOLOGY   No results found.  ____________________________________________    PROCEDURES  Procedure(s) performed:    Procedures    Medications - No data to display   ____________________________________________   INITIAL IMPRESSION / ASSESSMENT AND PLAN / ED COURSE  Pertinent labs & imaging results that were available during my care of the patient were reviewed by me and considered in my medical decision making (see chart for details).  Review of the [place] CSRS was performed in accordance of the [facility] prior to dispensing any controlled drugs.   Patient's diagnosis is consistent with skin infection.  Vital signs and exam are reassuring.  Patient has a painful indurated area with no fluctuance.  We discussed attempting  incision and drainage and patient would like to begin antibiotics and follow-up with her PCP this week.  Patient will be discharged home with prescriptions for clindamycin and a short course of tramadol. Patient is to follow up with PCP as directed. Patient is given ED precautions to return to the ED for any worsening or new symptoms.     ____________________________________________  FINAL CLINICAL IMPRESSION(S) / ED DIAGNOSES  Final diagnoses:  Skin infection      NEW MEDICATIONS STARTED DURING THIS VISIT:  ED Discharge Orders        Ordered    traMADol (ULTRAM) 50 MG tablet  Every 6 hours PRN     10/21/17 1409    clindamycin (CLEOCIN) 300 MG capsule  4 times daily     10/21/17 1409          This chart was dictated using voice recognition software/Dragon. Despite best efforts to proofread, errors can occur which can change the meaning. Any change was purely unintentional.    Enid DerryWagner, Leslye Puccini, PA-C 10/21/17 1817    Sharyn CreamerQuale, Mark, MD 10/22/17 [phone]

## 2017-10-21 NOTE — ED Notes (Signed)
Pt states has a hx of abscesses, usually under her arms. This one is in the pelvic region. Pt given a gown to change into.

## 2017-11-18 ENCOUNTER — Encounter: Payer: Self-pay | Admitting: Emergency Medicine

## 2017-11-18 ENCOUNTER — Other Ambulatory Visit: Payer: Self-pay

## 2017-11-18 ENCOUNTER — Emergency Department
Admission: EM | Admit: 2017-11-18 | Discharge: 2017-11-18 | Disposition: A | Discharge status: Home / Self Care | Payer: Medicaid Other | Attending: Emergency Medicine | Admitting: Emergency Medicine

## 2017-11-18 DIAGNOSIS — F17210 Nicotine dependence, cigarettes, uncomplicated: Secondary | ICD-10-CM | POA: Insufficient documentation

## 2017-11-18 DIAGNOSIS — W57XXXA Bitten or stung by nonvenomous insect and other nonvenomous arthropods, initial encounter: Secondary | ICD-10-CM | POA: Insufficient documentation

## 2017-11-18 DIAGNOSIS — E119 Type 2 diabetes mellitus without complications: Secondary | ICD-10-CM | POA: Insufficient documentation

## 2017-11-18 DIAGNOSIS — M62830 Muscle spasm of back: Secondary | ICD-10-CM | POA: Insufficient documentation

## 2017-11-18 DIAGNOSIS — M549 Dorsalgia, unspecified: Secondary | ICD-10-CM | POA: Diagnosis present

## 2017-11-18 MED ORDER — PREDNISONE 50 MG PO TABS: 50.0000 mg | ORAL_TABLET | Freq: Every day | ORAL | 0 refills | Status: DC

## 2017-11-18 MED ORDER — METHOCARBAMOL 500 MG PO TABS: 500.0000 mg | ORAL_TABLET | Freq: Four times a day (QID) | ORAL | 0 refills | Status: DC

## 2017-11-18 MED ORDER — ORPHENADRINE CITRATE 30 MG/ML IJ SOLN
60.0000 mg | Freq: Once | INTRAMUSCULAR | Status: AC
  Administered 2017-11-18: 60 mg via INTRAMUSCULAR
  Filled 2017-11-18: qty 2

## 2017-11-18 MED ORDER — DEXAMETHASONE SODIUM PHOSPHATE 10 MG/ML IJ SOLN
10.0000 mg | Freq: Once | INTRAMUSCULAR | Status: AC
  Administered 2017-11-18: 10 mg via INTRAMUSCULAR
  Filled 2017-11-18: qty 1

## 2017-11-18 NOTE — ED Notes (Signed)
Patient c/o insect bite to right upper arm. No swelling or redness noted to area.

## 2017-11-18 NOTE — ED Triage Notes (Signed)
Pt to ED via POV, pt states that she stayed at a friends house last night and this morning she woke up with a bug bite on her right arm. Pt in NAD at this time.

## 2017-11-18 NOTE — ED Provider Notes (Signed)
[facility] Emergency Department Provider Note  ____________________________________________  Time seen: Approximately 6:09 PM  I have reviewed the triage vital signs and the nursing notes.   HISTORY  Chief Complaint Insect Bite    HPI Karina Anderson is a 47 y.o. female resents emergency department reporting being bit by a spider.  Patient reports that she has stayed at a friend's house and was bitten by spider.  She did not actually see a spider.  She states that this occurred 4 days ago.  Initially, no symptoms however patient is developed diffuse back "cramps" patient is not taking any medication for this complaint.  She denies any other injury or complaint.  Patient denies any fevers or chills, nausea vomiting, chest pain, shortness of breath, abdominal pain, diarrhea or constipation.  No medications for this complaint prior to arrival.  Up-to-date on tetanus.  No other complaints.   Past Medical History:  Diagnosis Date  . Diabetes mellitus without complication (HCC)   . Hydradenitis   . Seizures [facility])     Patient Active Problem List   Diagnosis Date Noted  . Tobacco use disorder 03/13/2016  . Cannabis use disorder, moderate, dependence (HCC) 03/13/2016  . Bipolar affective disorder, manic, severe, with psychotic behavior (HCC) 03/12/2016  . Seizures (HCC) 03/11/2016  . Involuntary commitment 03/11/2016    History reviewed. No pertinent surgical history.  Prior to Admission medications   Medication Sig Start Date End Date Taking? Authorizing Provider  dicyclomine (BENTYL) 20 MG tablet Take 1 tablet (20 mg total) by mouth 3 (three) times daily as needed for spasms. 09/27/17 09/27/18  Myrna Blazer, MD  doxylamine, Sleep, (UNISOM) 25 MG tablet Take 50 mg by mouth at bedtime as needed.    [provider]  famotidine (PEPCID) 40 MG tablet Take 1 tablet (40 mg total) by mouth every evening. 09/27/17 09/27/18  Myrna Blazer, MD   hydrOXYzine (ATARAX/VISTARIL) 50 MG tablet Take 1 tablet (50 mg total) by mouth every 6 (six) hours as needed for anxiety. Patient not taking: Reported on 07/23/2017 03/29/16   Pucilowska, Ellin Goodie, MD  methocarbamol (ROBAXIN) 500 MG tablet Take 1 tablet (500 mg total) by mouth 4 (four) times daily. 11/18/17   Adlai Sinning, Delorise Royals, PA-C  phenazopyridine (PYRIDIUM) 200 MG tablet Take 1 tablet (200 mg total) by mouth 3 (three) times daily as needed for pain. Patient not taking: Reported on 07/23/2017 05/19/16   Menshew, Charlesetta Ivory, PA-C  phenytoin (DILANTIN) 100 MG ER capsule Take 1 capsule (100 mg total) by mouth 3 (three) times daily. Patient not taking: Reported on 07/23/2017 03/29/16   Shari Prows, MD  predniSONE (DELTASONE) 50 MG tablet Take 1 tablet (50 mg total) by mouth daily with breakfast. 11/18/17   Jodi Criscuolo, Delorise Royals, PA-C  QUEtiapine (SEROQUEL) 400 MG tablet Take 2 tablets (800 mg total) by mouth at bedtime. Patient not taking: Reported on 07/23/2017 03/29/16   Pucilowska, Ellin Goodie, MD  traMADol (ULTRAM) 50 MG tablet Take 1 tablet (50 mg total) by mouth every 6 (six) hours as needed. 10/21/17 10/21/18  Enid Derry, PA-C    Allergies Bactrim [sulfamethoxazole-trimethoprim]; Butorphanol tartrate; Food; Ibuprofen; Ketorolac; Lorazepam; Naproxen; Prochlorperazine; and Nsaids  No family history on file.  Social History Social History   Tobacco Use  . Smoking status: Current Every Day Smoker    Packs/day: 0.50    Types: Cigarettes  . Smokeless tobacco: Never Used  Substance Use Topics  . Alcohol use: No  .  Drug use: No     Review of Systems  Constitutional: No fever/chills Eyes: No visual changes. No discharge ENT: No upper respiratory complaints. Cardiovascular: no chest pain. Respiratory: no cough. No SOB. Gastrointestinal: No abdominal pain.  No nausea, no vomiting.  No diarrhea.  No constipation. Musculoskeletal: Negative for musculoskeletal pain. Skin:  Positive for spider bite to the right shoulder Neurological: Negative for headaches, focal weakness or numbness. 10-point ROS otherwise negative.  ____________________________________________   PHYSICAL EXAM:  VITAL SIGNS: ED Triage Vitals  Enc Vitals Group     BP 11/18/17 1608 124/87     Pulse Rate 11/18/17 1608 100     Resp 11/18/17 1608 16     Temp 11/18/17 1608 98.3 F (36.8 C)     Temp Source 11/18/17 1608 Oral     SpO2 11/18/17 1608 100 %     Weight 11/18/17 1609 183 lb (83 kg)     Height 11/18/17 1609 5\' 5"  (1.651 m)     Head Circumference --      Peak Flow --      Pain Score 11/18/17 1609 10     Pain Loc --      Pain Edu? --      Excl. in GC? --      Constitutional: Alert and oriented. Well appearing and in no acute distress. Eyes: Conjunctivae are normal. PERRL. EOMI. Head: Atraumatic. ENT:      Ears:       Nose: No congestion/rhinnorhea.      Mouth/Throat: Mucous membranes are moist.  Neck: No stridor.    Cardiovascular: Normal rate, regular rhythm. Normal S1 and S2.  Good peripheral circulation. Respiratory: Normal respiratory effort without tachypnea or retractions. Lungs CTAB. Good air entry to the bases with no decreased or absent breath sounds. Musculoskeletal: Full range of motion to all extremities. No gross deformities appreciated. Neurologic:  Normal speech and language. No gross focal neurologic deficits are appreciated.  Skin:  Skin is warm, dry and intact. No rash noted.  Small punctate lesion noted to the right lateral shoulder.  No erythema or edema.  Area is nontender to palpation.  No fluctuance or induration.  No pus expressed on palpation.   Psychiatric: Mood and affect are normal. Speech and behavior are normal. Patient exhibits appropriate insight and judgement.   ____________________________________________   LABS (all labs ordered are listed, but only abnormal results are displayed)  Labs Reviewed - No data to  display ____________________________________________  EKG   ____________________________________________  RADIOLOGY   No results found.  ____________________________________________    PROCEDURES  Procedure(s) performed:    Procedures    Medications  dexamethasone (DECADRON) injection 10 mg (has no administration in time range)  orphenadrine (NORFLEX) injection 60 mg (has no administration in time range)     ____________________________________________   INITIAL IMPRESSION / ASSESSMENT AND PLAN / ED COURSE  Pertinent labs & imaging results that were available during my care of the patient were reviewed by me and considered in my medical decision making (see chart for details).  Review of the [place] CSRS was performed in accordance of the [facility] prior to dispensing any controlled drugs.     Patient's diagnosis is consistent with insect bite, muscle spasms of the back.  Patient presented to the emergency department complaining of spider bite.  No indication of black widow or brown recluse spider bite.  No localized signs of infection.  Patient repeatedly asked for pain medication both to the nursing staff and  myself.  At this time patient will not be prescribed any narcotics.  She states that she is allergic to all NSAIDs including ibuprofen, Naprosyn, Toradol, Mobic.  Patient will be given injection of steroid and muscle relaxer in the emergency department, be discharged with prednisone and muscle relaxer..  No indication for labs or imaging.  Patient is to follow up with primary care as needed or otherwise directed. Patient is given ED precautions to return to the ED for any worsening or new symptoms.     ____________________________________________  FINAL CLINICAL IMPRESSION(S) / ED DIAGNOSES  Final diagnoses:  Insect bite, initial encounter  Back muscle spasm      NEW MEDICATIONS STARTED DURING THIS VISIT:  ED Discharge Orders        Ordered    predniSONE  (DELTASONE) 50 MG tablet  Daily with breakfast     11/18/17 1811    methocarbamol (ROBAXIN) 500 MG tablet  4 times daily     11/18/17 1811          This chart was dictated using voice recognition software/Dragon. Despite best efforts to proofread, errors can occur which can change the meaning. Any change was purely unintentional.    Racheal PatchesCuthriell, Julliette Frentz D, PA-C 11/18/17 1816    Minna AntisPaduchowski, Kevin, MD 11/18/17 2318

## 2017-12-24 ENCOUNTER — Other Ambulatory Visit: Payer: Self-pay

## 2017-12-24 DIAGNOSIS — E119 Type 2 diabetes mellitus without complications: Secondary | ICD-10-CM | POA: Diagnosis not present

## 2017-12-24 DIAGNOSIS — R109 Unspecified abdominal pain: Secondary | ICD-10-CM | POA: Diagnosis not present

## 2017-12-24 DIAGNOSIS — F17210 Nicotine dependence, cigarettes, uncomplicated: Secondary | ICD-10-CM | POA: Insufficient documentation

## 2017-12-24 DIAGNOSIS — R112 Nausea with vomiting, unspecified: Secondary | ICD-10-CM | POA: Diagnosis not present

## 2017-12-24 DIAGNOSIS — R1013 Epigastric pain: Secondary | ICD-10-CM | POA: Diagnosis not present

## 2017-12-24 DIAGNOSIS — K2900 Acute gastritis without bleeding: Secondary | ICD-10-CM | POA: Insufficient documentation

## 2017-12-24 DIAGNOSIS — Z79899 Other long term (current) drug therapy: Secondary | ICD-10-CM | POA: Insufficient documentation

## 2017-12-24 LAB — URINALYSIS, COMPLETE (UACMP) WITH MICROSCOPIC
BACTERIA UA: NONE SEEN
Bilirubin Urine: NEGATIVE
Glucose, UA: NEGATIVE mg/dL
Hgb urine dipstick: NEGATIVE
KETONES UR: NEGATIVE mg/dL
LEUKOCYTES UA: NEGATIVE
Nitrite: NEGATIVE
PH: 6 (ref 5.0–8.0)
PROTEIN: 30 mg/dL — AB
Specific Gravity, Urine: 1.026 (ref 1.005–1.030)

## 2017-12-24 LAB — COMPREHENSIVE METABOLIC PANEL
ALT: 10 U/L — AB (ref 14–54)
ANION GAP: 7 (ref 5–15)
AST: 19 U/L (ref 15–41)
Albumin: 4.2 g/dL (ref 3.5–5.0)
Alkaline Phosphatase: 81 U/L (ref 38–126)
BUN: 20 mg/dL (ref 6–20)
CHLORIDE: 105 mmol/L (ref 101–111)
CO2: 28 mmol/L (ref 22–32)
CREATININE: 0.77 mg/dL (ref 0.44–1.00)
Calcium: 9.2 mg/dL (ref 8.9–10.3)
Glucose, Bld: 112 mg/dL — ABNORMAL HIGH (ref 65–99)
POTASSIUM: 3.6 mmol/L (ref 3.5–5.1)
SODIUM: 140 mmol/L (ref 135–145)
Total Bilirubin: 0.7 mg/dL (ref 0.3–1.2)
Total Protein: 8.3 g/dL — ABNORMAL HIGH (ref 6.5–8.1)

## 2017-12-24 LAB — LIPASE, BLOOD: LIPASE: 19 U/L (ref 11–51)

## 2017-12-24 LAB — CBC
HEMATOCRIT: 36.9 % (ref 35.0–47.0)
HEMOGLOBIN: 12.8 g/dL (ref 12.0–16.0)
MCH: 29.4 pg (ref 26.0–34.0)
MCHC: 34.6 g/dL (ref 32.0–36.0)
MCV: 85.1 fL (ref 80.0–100.0)
Platelets: 319 10*3/uL (ref 150–440)
RBC: 4.33 MIL/uL (ref 3.80–5.20)
RDW: 14.2 % (ref 11.5–14.5)
WBC: 6.1 10*3/uL (ref 3.6–11.0)

## 2017-12-24 LAB — POCT PREGNANCY, URINE: PREG TEST UR: NEGATIVE

## 2017-12-24 MED ORDER — OXYCODONE-ACETAMINOPHEN 5-325 MG PO TABS
1.0000 | ORAL_TABLET | Freq: Once | ORAL | Status: AC
  Administered 2017-12-24: 1 via ORAL

## 2017-12-24 MED ORDER — OXYCODONE-ACETAMINOPHEN 5-325 MG PO TABS
ORAL_TABLET | ORAL | Status: AC
  Filled 2017-12-24: qty 1

## 2017-12-24 NOTE — ED Notes (Signed)
Patient states she can take Percocet for pain. Does not and has not had a reaction to it in the past.

## 2017-12-24 NOTE — ED Triage Notes (Signed)
Patient to ED for epigastric pain that radiates around to the back. States Alka-Seltzer has not worked at all. No appetite for three days since the onset and when she did try to eat it "comes back on her." Patient with guarding over right upper quadrant.

## 2017-12-25 ENCOUNTER — Other Ambulatory Visit: Payer: Self-pay

## 2017-12-25 ENCOUNTER — Emergency Department
Admission: EM | Admit: 2017-12-25 | Discharge: 2017-12-25 | Disposition: A | Discharge status: Home / Self Care | Payer: Medicaid Other | Attending: Emergency Medicine | Admitting: Emergency Medicine

## 2017-12-25 ENCOUNTER — Emergency Department: Payer: Medicaid Other

## 2017-12-25 DIAGNOSIS — R109 Unspecified abdominal pain: Secondary | ICD-10-CM

## 2017-12-25 DIAGNOSIS — R112 Nausea with vomiting, unspecified: Secondary | ICD-10-CM

## 2017-12-25 DIAGNOSIS — R1013 Epigastric pain: Secondary | ICD-10-CM | POA: Diagnosis not present

## 2017-12-25 DIAGNOSIS — K2900 Acute gastritis without bleeding: Secondary | ICD-10-CM

## 2017-12-25 LAB — TROPONIN I

## 2017-12-25 MED ORDER — MORPHINE SULFATE (PF) 4 MG/ML IV SOLN
4.0000 mg | Freq: Once | INTRAVENOUS | Status: AC
  Administered 2017-12-25: 4 mg via INTRAMUSCULAR
  Filled 2017-12-25: qty 1

## 2017-12-25 MED ORDER — SUCRALFATE 1 G PO TABS: 1.0000 g | ORAL_TABLET | Freq: Two times a day (BID) | ORAL | 0 refills | Status: DC

## 2017-12-25 MED ORDER — ONDANSETRON 4 MG PO TBDP
4.0000 mg | ORAL_TABLET | Freq: Once | ORAL | Status: AC
  Administered 2017-12-25: 4 mg via ORAL
  Filled 2017-12-25: qty 1

## 2017-12-25 MED ORDER — FAMOTIDINE 40 MG PO TABS: 40.0000 mg | ORAL_TABLET | Freq: Every evening | ORAL | 0 refills | Status: DC

## 2017-12-25 MED ORDER — GI COCKTAIL ~~LOC~~
30.0000 mL | Freq: Once | ORAL | Status: AC
  Administered 2017-12-25: 30 mL via ORAL
  Filled 2017-12-25: qty 30

## 2017-12-25 MED ORDER — METOCLOPRAMIDE HCL 10 MG PO TABS: 10.0000 mg | ORAL_TABLET | Freq: Three times a day (TID) | ORAL | 0 refills | Status: DC | PRN

## 2017-12-25 NOTE — ED Provider Notes (Signed)
[facility] Emergency Department Provider Note   ____________________________________________   First MD Initiated Contact with Patient 12/25/17 0129     (approximate)  I have reviewed the triage vital signs and the nursing notes.   HISTORY  Chief Complaint Abdominal Pain    HPI Karina Anderson is a 47 y.o. female who comes into the hospital today with some sharp epigastric pain and vomiting for the past 3 days.  The patient states that she took some Alka-Seltzer for the symptoms at home but it did not help.  She states that she has never had this before.  The patient endorses some shortness of breath with walking but denies any chest pain.  The patient has had some occasional hot flashes but never took her temperature to see if she had a fever.  The patient has been eating little bits of food here and there.  She states that her pain was better after she was given a Percocet in triage but it coming back.  The patient reports that her pain is currently an 8 out of 10 in intensity.  She denies any diarrhea.  She is here today for evaluation.   Past Medical History:  Diagnosis Date  . Diabetes mellitus without complication (HCC)   . Hydradenitis   . Seizures [facility])     Patient Active Problem List   Diagnosis Date Noted  . Tobacco use disorder 03/13/2016  . Cannabis use disorder, moderate, dependence (HCC) 03/13/2016  . Bipolar affective disorder, manic, severe, with psychotic behavior (HCC) 03/12/2016  . Seizures (HCC) 03/11/2016  . Involuntary commitment 03/11/2016    History reviewed. No pertinent surgical history.  Prior to Admission medications   Medication Sig Start Date End Date Taking? Authorizing Provider  dicyclomine (BENTYL) 20 MG tablet Take 1 tablet (20 mg total) by mouth 3 (three) times daily as needed for spasms. 09/27/17 09/27/18  Myrna Blazer, MD  doxylamine, Sleep, (UNISOM) 25 MG tablet Take 50 mg by mouth at bedtime as  needed.    [provider]  famotidine (PEPCID) 40 MG tablet Take 1 tablet (40 mg total) by mouth every evening. 09/27/17 09/27/18  Myrna Blazer, MD  famotidine (PEPCID) 40 MG tablet Take 1 tablet (40 mg total) by mouth every evening. 12/25/17 12/25/18  Rebecka Apley, MD  hydrOXYzine (ATARAX/VISTARIL) 50 MG tablet Take 1 tablet (50 mg total) by mouth every 6 (six) hours as needed for anxiety. Patient not taking: Reported on 07/23/2017 03/29/16   Pucilowska, Ellin Goodie, MD  methocarbamol (ROBAXIN) 500 MG tablet Take 1 tablet (500 mg total) by mouth 4 (four) times daily. 11/18/17   Cuthriell, Delorise Royals, PA-C  metoCLOPramide (REGLAN) 10 MG tablet Take 1 tablet (10 mg total) by mouth every 8 (eight) hours as needed. 12/25/17   Rebecka Apley, MD  phenazopyridine (PYRIDIUM) 200 MG tablet Take 1 tablet (200 mg total) by mouth 3 (three) times daily as needed for pain. Patient not taking: Reported on 07/23/2017 05/19/16   Menshew, Charlesetta Ivory, PA-C  phenytoin (DILANTIN) 100 MG ER capsule Take 1 capsule (100 mg total) by mouth 3 (three) times daily. Patient not taking: Reported on 07/23/2017 03/29/16   Shari Prows, MD  predniSONE (DELTASONE) 50 MG tablet Take 1 tablet (50 mg total) by mouth daily with breakfast. 11/18/17   Cuthriell, Delorise Royals, PA-C  QUEtiapine (SEROQUEL) 400 MG tablet Take 2 tablets (800 mg total) by mouth at bedtime. Patient not taking: Reported on 07/23/2017  03/29/16   Pucilowska, Jolanta B, MD  sucralfate (CARAFATE) 1 g tablet Take 1 tablet (1 g total) by mouth 2 (two) times daily. 12/25/17   Rebecka Apley, MD  traMADol (ULTRAM) 50 MG tablet Take 1 tablet (50 mg total) by mouth every 6 (six) hours as needed. 10/21/17 10/21/18  Enid Derry, PA-C    Allergies Bactrim [sulfamethoxazole-trimethoprim]; Butorphanol tartrate; Food; Ibuprofen; Ketorolac; Lorazepam; Naproxen; Prochlorperazine; and Nsaids  No family history on file.  Social History Social  History   Tobacco Use  . Smoking status: Current Every Day Smoker    Packs/day: 0.50    Types: Cigarettes  . Smokeless tobacco: Never Used  Substance Use Topics  . Alcohol use: No  . Drug use: No    Review of Systems  Constitutional: No fever/chills Eyes: No visual changes. ENT: No sore throat. Cardiovascular: Denies chest pain. Respiratory: shortness of breath. Gastrointestinal:  abdominal pain.   nausea,  vomiting.  No diarrhea.  No constipation. Genitourinary: Negative for dysuria. Musculoskeletal: Negative for back pain. Skin: Negative for rash. Neurological: Negative for headaches, focal weakness or numbness.   ____________________________________________   PHYSICAL EXAM:  VITAL SIGNS: ED Triage Vitals [12/24/17 2144]  Enc Vitals Group     BP (!) 158/117     Pulse Rate 99     Resp 18     Temp 99.3 F (37.4 C)     Temp Source Oral     SpO2 100 %     Weight 177 lb (80.3 kg)     Height  (1.651 m)     Head Circumference      Peak Flow      Pain Score 10     Pain Loc      Pain Edu?      Excl. in GC?     Constitutional: Alert and oriented. Well appearing and in moderate distress. Eyes: Conjunctivae are normal. PERRL. EOMI. Head: Atraumatic. Nose: No congestion/rhinnorhea. Mouth/Throat: Mucous membranes are moist.  Oropharynx non-erythematous. Cardiovascular: Normal rate, regular rhythm. Grossly normal heart sounds.  Good peripheral circulation. Respiratory: Normal respiratory effort.  No retractions. Lungs CTAB. Gastrointestinal: Soft with some epigastric tenderness to palpation. No distention.  Positive bowel sounds Musculoskeletal: No lower extremity tenderness nor edema.   Neurologic:  Normal speech and language.  Skin:  Skin is warm, dry and intact.  Psychiatric: Mood and affect are normal.   ____________________________________________   LABS (all labs ordered are listed, but only abnormal results are displayed)  Labs Reviewed    COMPREHENSIVE METABOLIC PANEL - Abnormal; Notable for the following components:      Result Value   Glucose, Bld 112 (*)    Total Protein 8.3 (*)    ALT 10 (*)    All other components within normal limits  URINALYSIS, COMPLETE (UACMP) WITH MICROSCOPIC - Abnormal; Notable for the following components:   Color, Urine YELLOW (*)    APPearance HAZY (*)    Protein, ur 30 (*)    All other components within normal limits  LIPASE, BLOOD  CBC  TROPONIN I  POCT PREGNANCY, URINE  POC URINE PREG, ED   ____________________________________________  EKG  ED ECG REPORT I, Rebecka Apley, the attending physician, personally viewed and interpreted this ECG.   Date: 12/24/2017  EKG Time: 2153  Rate: 94  Rhythm: normal sinus rhythm  Axis: normal  Intervals:none  ST&T Change: none  ____________________________________________  RADIOLOGY  ED MD interpretation:  [place] abd RUQ: Normal right  upper quadrant abdominal ultrasound  Chest x-ray: No active disease.  Official radiology report(s): Dg Chest 1 View  Result Date: 12/25/2017 CLINICAL DATA:  Epigastric pain EXAM: CHEST  1 VIEW COMPARISON:  09/27/2017 FINDINGS: The heart size and mediastinal contours are within normal limits. Both lungs are clear. The visualized skeletal structures are unremarkable. IMPRESSION: No active disease. Electronically Signed   By: Tollie Eth M.D.   On: 12/25/2017 02:23   US Abdomen Limited Ruq  Result Date: 12/25/2017 CLINICAL DATA:  Abdominal pain EXAM: ULTRASOUND ABDOMEN LIMITED RIGHT UPPER QUADRANT COMPARISON:  CT from 07/23/2017 FINDINGS: Gallbladder: No gallstones or wall thickening visualized. No sonographic Murphy sign noted by sonographer. Common bile duct: Diameter: 2.7 mm Liver: No focal lesion identified. Within normal limits in parenchymal echogenicity. Portal vein is patent on color Doppler imaging with normal direction of blood flow towards the liver. IMPRESSION: Normal right upper quadrant  abdominal ultrasound. Electronically Signed   By: Tollie Eth M.D.   On: 12/25/2017 02:41    ____________________________________________   PROCEDURES  Procedure(s) performed: None  Procedures  Critical Care performed: No  ____________________________________________   INITIAL IMPRESSION / ASSESSMENT AND PLAN / ED COURSE  As part of my medical decision making, I reviewed the following data within the electronic MEDICAL RECORD NUMBER Notes from prior ED visits and [place] Controlled Substance Database   This is a 47 year old female who comes into the hospital today with some epigastric pain, nausea and vomiting.  My differential diagnosis includes gastritis, ulcer, reflux, pancreatitis, cholecystitis, cholelithiasis.  We did check some blood work on the patient to include a CBC, CMP, lipase which were all negative.  I will add a troponin onto the patient's blood work and sent her for an ultrasound and a chest x-ray.  I will give the patient a shot of morphine 1 mg IM and some Zofran 4 mg ODT.  The patient will be reassessed once I receive her results.  The patient's x-ray and ultrasound are both unremarkable.  I did give the patient a GI cocktail with a concern for gastritis.  The patient will be discharged home to follow-up with her primary care physician for further evaluation of her symptoms.      ____________________________________________   FINAL CLINICAL IMPRESSION(S) / ED DIAGNOSES  Final diagnoses:  Abdominal pain  Acute gastritis without hemorrhage, unspecified gastritis type  Non-intractable vomiting with nausea, unspecified vomiting type     ED Discharge Orders        Ordered    sucralfate (CARAFATE) 1 g tablet  2 times daily     12/25/17 0327    famotidine (PEPCID) 40 MG tablet  Every evening     12/25/17 0327    metoCLOPramide (REGLAN) 10 MG tablet  Every 8 hours PRN     12/25/17 0327       Note:  This document was prepared using Dragon voice recognition  software and may include unintentional dictation errors.    Rebecka Apley, MD 12/25/17 [phone]

## 2017-12-25 NOTE — Discharge Instructions (Addendum)
Please follow-up with your primary care physician for further evaluation of your abdominal pain nausea and vomiting.  Your ultrasound is unremarkable and your blood work is also unremarkable.

## 2017-12-25 NOTE — ED Notes (Signed)
Patient transported to Ultrasound 

## 2017-12-25 NOTE — ED Notes (Signed)
Pt surly, poor eye contact and mumbled speech, indirect comments about wait time, apologized to pt for wait time, pt to ED c/o sharp epigastric pain that started 3 days ago with N/V after eating, pt also complaining of SOB after walking to the store (unreportable distance), and frequency of urination,  Pt denies diarrhea, reports 30 min to stool but reports this as "NOT CONSTIPATED", reports pain went from 11 to 2 after percocet in triage  Pt denies any medical hx, meds and only allergy to Bactrim, pt requesting apple juice

## 2018-01-26 ENCOUNTER — Encounter: Payer: Self-pay | Admitting: Emergency Medicine

## 2018-01-26 ENCOUNTER — Emergency Department
Admission: EM | Admit: 2018-01-26 | Discharge: 2018-01-26 | Disposition: A | Discharge status: Home / Self Care | Payer: Medicaid Other | Attending: Student in an Organized Health Care Education/Training Program | Admitting: Student in an Organized Health Care Education/Training Program

## 2018-01-26 DIAGNOSIS — N898 Other specified noninflammatory disorders of vagina: Secondary | ICD-10-CM | POA: Diagnosis present

## 2018-01-26 DIAGNOSIS — E119 Type 2 diabetes mellitus without complications: Secondary | ICD-10-CM | POA: Insufficient documentation

## 2018-01-26 DIAGNOSIS — B9689 Other specified bacterial agents as the cause of diseases classified elsewhere: Secondary | ICD-10-CM | POA: Insufficient documentation

## 2018-01-26 DIAGNOSIS — Z79899 Other long term (current) drug therapy: Secondary | ICD-10-CM | POA: Diagnosis not present

## 2018-01-26 DIAGNOSIS — N760 Acute vaginitis: Secondary | ICD-10-CM | POA: Insufficient documentation

## 2018-01-26 DIAGNOSIS — F17210 Nicotine dependence, cigarettes, uncomplicated: Secondary | ICD-10-CM | POA: Insufficient documentation

## 2018-01-26 DIAGNOSIS — A599 Trichomoniasis, unspecified: Secondary | ICD-10-CM | POA: Diagnosis not present

## 2018-01-26 LAB — WET PREP, GENITAL
Sperm: NONE SEEN
YEAST WET PREP: NONE SEEN

## 2018-01-26 LAB — URINALYSIS, ROUTINE W REFLEX MICROSCOPIC
Bilirubin Urine: NEGATIVE
Glucose, UA: NEGATIVE mg/dL
HGB URINE DIPSTICK: NEGATIVE
Ketones, ur: NEGATIVE mg/dL
Leukocytes, UA: NEGATIVE
NITRITE: NEGATIVE
Protein, ur: NEGATIVE mg/dL
SPECIFIC GRAVITY, URINE: 1.024 (ref 1.005–1.030)
pH: 5 (ref 5.0–8.0)

## 2018-01-26 LAB — POCT PREGNANCY, URINE: PREG TEST UR: NEGATIVE

## 2018-01-26 MED ORDER — METRONIDAZOLE 500 MG PO TABS
2000.0000 mg | ORAL_TABLET | Freq: Once | ORAL | Status: AC
  Administered 2018-01-26: 2000 mg via ORAL
  Filled 2018-01-26: qty 4

## 2018-01-26 MED ORDER — OXYCODONE-ACETAMINOPHEN 5-325 MG PO TABS
1.0000 | ORAL_TABLET | Freq: Once | ORAL | Status: AC
  Administered 2018-01-26: 1 via ORAL
  Filled 2018-01-26: qty 1

## 2018-01-26 MED ORDER — METRONIDAZOLE 500 MG PO TABS: 500.0000 mg | ORAL_TABLET | Freq: Two times a day (BID) | ORAL | 0 refills | Status: AC

## 2018-01-26 NOTE — ED Provider Notes (Signed)
[facility] Emergency Department Provider Note  ____________________________________________  Time seen: Approximately 8:51 PM  I have reviewed the triage vital signs and the nursing notes.   HISTORY  Chief Complaint Back Pain and Vaginal Discharge    HPI Karina Anderson is a 47 y.o. female that presents to the emergency department for evaluation of white and yellow discharge for several days and chronic low back pain.  No bowel or bladder dysfunction or saddle paresthesias.  No difficulty walking.  No trauma.  No fever, chills, nausea, vomiting, abdominal pain, dysuria, urgency, frequency.   Past Medical History:  Diagnosis Date  . Diabetes mellitus without complication (HCC)   . Hydradenitis   . Seizures [facility])     Patient Active Problem List   Diagnosis Date Noted  . Tobacco use disorder 03/13/2016  . Cannabis use disorder, moderate, dependence (HCC) 03/13/2016  . Bipolar affective disorder, manic, severe, with psychotic behavior (HCC) 03/12/2016  . Seizures (HCC) 03/11/2016  . Involuntary commitment 03/11/2016    History reviewed. No pertinent surgical history.  Prior to Admission medications   Medication Sig Start Date End Date Taking? Authorizing Provider  dicyclomine (BENTYL) 20 MG tablet Take 1 tablet (20 mg total) by mouth 3 (three) times daily as needed for spasms. 09/27/17 09/27/18  Myrna Blazer, MD  doxylamine, Sleep, (UNISOM) 25 MG tablet Take 50 mg by mouth at bedtime as needed.    [provider]  famotidine (PEPCID) 40 MG tablet Take 1 tablet (40 mg total) by mouth every evening. 09/27/17 09/27/18  Myrna Blazer, MD  famotidine (PEPCID) 40 MG tablet Take 1 tablet (40 mg total) by mouth every evening. 12/25/17 12/25/18  Rebecka Apley, MD  hydrOXYzine (ATARAX/VISTARIL) 50 MG tablet Take 1 tablet (50 mg total) by mouth every 6 (six) hours as needed for anxiety. Patient not taking: Reported on 07/23/2017 03/29/16    Pucilowska, Ellin Goodie, MD  methocarbamol (ROBAXIN) 500 MG tablet Take 1 tablet (500 mg total) by mouth 4 (four) times daily. 11/18/17   Cuthriell, Delorise Royals, PA-C  metoCLOPramide (REGLAN) 10 MG tablet Take 1 tablet (10 mg total) by mouth every 8 (eight) hours as needed. 12/25/17   Rebecka Apley, MD  metroNIDAZOLE (FLAGYL) 500 MG tablet Take 1 tablet (500 mg total) by mouth 2 (two) times daily for 7 days. 01/26/18 02/02/18  Enid Derry, PA-C  phenazopyridine (PYRIDIUM) 200 MG tablet Take 1 tablet (200 mg total) by mouth 3 (three) times daily as needed for pain. Patient not taking: Reported on 07/23/2017 05/19/16   Menshew, Charlesetta Ivory, PA-C  phenytoin (DILANTIN) 100 MG ER capsule Take 1 capsule (100 mg total) by mouth 3 (three) times daily. Patient not taking: Reported on 07/23/2017 03/29/16   Shari Prows, MD  predniSONE (DELTASONE) 50 MG tablet Take 1 tablet (50 mg total) by mouth daily with breakfast. 11/18/17   Cuthriell, Delorise Royals, PA-C  QUEtiapine (SEROQUEL) 400 MG tablet Take 2 tablets (800 mg total) by mouth at bedtime. Patient not taking: Reported on 07/23/2017 03/29/16   Pucilowska, Braulio Conte B, MD  sucralfate (CARAFATE) 1 g tablet Take 1 tablet (1 g total) by mouth 2 (two) times daily. 12/25/17   Rebecka Apley, MD  traMADol (ULTRAM) 50 MG tablet Take 1 tablet (50 mg total) by mouth every 6 (six) hours as needed. 10/21/17 10/21/18  Enid Derry, PA-C    Allergies Bactrim [sulfamethoxazole-trimethoprim]; Butorphanol tartrate; Food; Ibuprofen; Ketorolac; Lorazepam; Naproxen; Prochlorperazine; and Nsaids  No family  history on file.  Social History Social History   Tobacco Use  . Smoking status: Current Every Day Smoker    Packs/day: 0.50    Types: Cigarettes  . Smokeless tobacco: Never Used  Substance Use Topics  . Alcohol use: No  . Drug use: No     Review of Systems  Constitutional: No fever/chills Cardiovascular: No chest pain. Respiratory:No  SOB. Gastrointestinal: No abdominal pain.  No nausea, no vomiting.  Musculoskeletal: Positive for back pain.  Skin: Negative for rash, abrasions, lacerations, ecchymosis.  ____________________________________________   PHYSICAL EXAM:  VITAL SIGNS: ED Triage Vitals  Enc Vitals Group     BP 01/26/18 1921 112/82     Pulse Rate 01/26/18 1921 98     Resp 01/26/18 1921 18     Temp 01/26/18 1921 98.7 F (37.1 C)     Temp Source 01/26/18 1921 Oral     SpO2 01/26/18 1921 98 %     Weight --      Height --      Head Circumference --      Peak Flow --      Pain Score 01/26/18 1923 10     Pain Loc --      Pain Edu? --      Excl. in GC? --      Constitutional: Alert and oriented. Well appearing and in no acute distress. Eyes: Conjunctivae are normal. PERRL. EOMI. Head: Atraumatic. ENT:      Ears:      Nose: No congestion/rhinnorhea.      Mouth/Throat: Mucous membranes are moist.  Neck: No stridor.  Cardiovascular: Normal rate, regular rhythm.  Good peripheral circulation. Respiratory: Normal respiratory effort without tachypnea or retractions. Lungs CTAB. Good air entry to the bases with no decreased or absent breath sounds. Gastrointestinal: Bowel sounds 4 quadrants. Soft and nontender to palpation. No guarding or rigidity. No palpable masses. No distention. No CVA tenderness. Musculoskeletal: Full range of motion to all extremities. No gross deformities appreciated.  No tenderness to palpation over lumbar spine.  Normal gait.  Strength and sensation equal in lower extremity's bilaterally. Genitourinary: No external lesions.  White vaginal discharge.  No cervical motion tenderness. Neurologic:  Normal speech and language. No gross focal neurologic deficits are appreciated.  Skin:  Skin is warm, dry and intact. No rash noted. Psychiatric: Mood and affect are normal. Speech and behavior are normal. Patient exhibits appropriate insight and  judgement.   ____________________________________________   LABS (all labs ordered are listed, but only abnormal results are displayed)  Labs Reviewed  WET PREP, GENITAL - Abnormal; Notable for the following components:      Result Value   Trich, Wet Prep PRESENT (*)    Clue Cells Wet Prep HPF POC PRESENT (*)    WBC, Wet Prep HPF POC MODERATE (*)    All other components within normal limits  URINALYSIS, ROUTINE W REFLEX MICROSCOPIC - Abnormal; Notable for the following components:   Color, Urine YELLOW (*)    APPearance HAZY (*)    All other components within normal limits  CHLAMYDIA/NGC RT PCR ([facility] ONLY)  POC URINE PREG, ED  POCT PREGNANCY, URINE   ____________________________________________  EKG   ____________________________________________  RADIOLOGY  No results found.  ____________________________________________    PROCEDURES  Procedure(s) performed:    Procedures    Medications  oxyCODONE-acetaminophen (PERCOCET/ROXICET) 5-325 MG per tablet 1 tablet (1 tablet Oral Given 01/26/18 2248)  metroNIDAZOLE (FLAGYL) tablet 2,000 mg (2,000 mg Oral Given  01/26/18 2317)     ____________________________________________   INITIAL IMPRESSION / ASSESSMENT AND PLAN / ED COURSE  Pertinent labs & imaging results that were available during my care of the patient were reviewed by me and considered in my medical decision making (see chart for details).  Review of the [place] CSRS was performed in accordance of the [facility] prior to dispensing any controlled drugs.     Patient's diagnosis is consistent with trichomonas, bacterial vaginosis and chronic back pain.  Vital signs and exam are reassuring.  Wet prep consistent with trichomonas and bacterial vaginosis.  Gonorrhea and Chlamydia are negative.  No infection on urinalysis.  Patient will be discharged home with prescriptions for Flagyl. Patient is to follow up with PCP as directed. Patient is given ED  precautions to return to the ED for any worsening or new symptoms.     ____________________________________________  FINAL CLINICAL IMPRESSION(S) / ED DIAGNOSES  Final diagnoses:  Bacterial vaginosis  Trichomoniasis      NEW MEDICATIONS STARTED DURING THIS VISIT:  ED Discharge Orders        Ordered    metroNIDAZOLE (FLAGYL) 500 MG tablet  2 times daily     01/26/18 2315          This chart was dictated using voice recognition software/Dragon. Despite best efforts to proofread, errors can occur which can change the meaning. Any change was purely unintentional.    Enid Derry, PA-C 01/27/18 1734    Willy Eddy, MD 01/30/18 [phone]

## 2018-01-26 NOTE — ED Notes (Signed)
Called for room in flex and no answer in flex waiting room or main lobby.

## 2018-01-26 NOTE — ED Triage Notes (Signed)
Pt states she has chronic back pain since giving birth to her child and says she has had vaginal dc for the last week with no burning sensation or hematuria.  Pt states she has a hx of UTI.

## 2018-01-27 LAB — CHLAMYDIA/NGC RT PCR (ARMC ONLY)
CHLAMYDIA TR: NOT DETECTED
N GONORRHOEAE: NOT DETECTED

## 2018-04-09 ENCOUNTER — Emergency Department
Admission: EM | Admit: 2018-04-09 | Discharge: 2018-04-09 | Disposition: A | Discharge status: Home / Self Care | Payer: Medicaid Other | Attending: Emergency Medicine | Admitting: Emergency Medicine

## 2018-04-09 ENCOUNTER — Encounter: Payer: Self-pay | Admitting: Emergency Medicine

## 2018-04-09 ENCOUNTER — Other Ambulatory Visit: Payer: Self-pay

## 2018-04-09 DIAGNOSIS — N390 Urinary tract infection, site not specified: Secondary | ICD-10-CM | POA: Insufficient documentation

## 2018-04-09 DIAGNOSIS — E119 Type 2 diabetes mellitus without complications: Secondary | ICD-10-CM | POA: Insufficient documentation

## 2018-04-09 DIAGNOSIS — B9689 Other specified bacterial agents as the cause of diseases classified elsewhere: Secondary | ICD-10-CM | POA: Diagnosis not present

## 2018-04-09 DIAGNOSIS — F17210 Nicotine dependence, cigarettes, uncomplicated: Secondary | ICD-10-CM | POA: Diagnosis not present

## 2018-04-09 DIAGNOSIS — N899 Noninflammatory disorder of vagina, unspecified: Secondary | ICD-10-CM | POA: Diagnosis present

## 2018-04-09 DIAGNOSIS — Z79899 Other long term (current) drug therapy: Secondary | ICD-10-CM | POA: Insufficient documentation

## 2018-04-09 DIAGNOSIS — N760 Acute vaginitis: Secondary | ICD-10-CM | POA: Insufficient documentation

## 2018-04-09 LAB — URINALYSIS, ROUTINE W REFLEX MICROSCOPIC
Bilirubin Urine: NEGATIVE
GLUCOSE, UA: NEGATIVE mg/dL
Hgb urine dipstick: NEGATIVE
KETONES UR: NEGATIVE mg/dL
Nitrite: NEGATIVE
PH: 7 (ref 5.0–8.0)
Protein, ur: NEGATIVE mg/dL
SPECIFIC GRAVITY, URINE: 1.021 (ref 1.005–1.030)

## 2018-04-09 LAB — CHLAMYDIA/NGC RT PCR (ARMC ONLY)
Chlamydia Tr: NOT DETECTED
N gonorrhoeae: NOT DETECTED

## 2018-04-09 LAB — WET PREP, GENITAL
SPERM: NONE SEEN
TRICH WET PREP: NONE SEEN
Yeast Wet Prep HPF POC: NONE SEEN

## 2018-04-09 LAB — POCT PREGNANCY, URINE: Preg Test, Ur: NEGATIVE

## 2018-04-09 MED ORDER — METRONIDAZOLE 500 MG PO TABS: 500.0000 mg | ORAL_TABLET | Freq: Two times a day (BID) | ORAL | 0 refills | Status: DC

## 2018-04-09 MED ORDER — CEPHALEXIN 500 MG PO CAPS: 500.0000 mg | ORAL_CAPSULE | Freq: Two times a day (BID) | ORAL | 0 refills | Status: DC

## 2018-04-09 MED ORDER — TRAMADOL HCL 50 MG PO TABS: 50.0000 mg | ORAL_TABLET | Freq: Four times a day (QID) | ORAL | 0 refills | Status: AC | PRN

## 2018-04-09 MED ORDER — TRAMADOL HCL 50 MG PO TABS: 50.0000 mg | ORAL_TABLET | Freq: Four times a day (QID) | ORAL | 0 refills | Status: DC | PRN

## 2018-04-09 MED ORDER — CEPHALEXIN 500 MG PO CAPS: 500.0000 mg | ORAL_CAPSULE | Freq: Two times a day (BID) | ORAL | 0 refills | Status: AC

## 2018-04-09 MED ORDER — METRONIDAZOLE 500 MG PO TABS: 500.0000 mg | ORAL_TABLET | Freq: Two times a day (BID) | ORAL | 0 refills | Status: AC

## 2018-04-09 NOTE — ED Notes (Signed)
See triage note  States she is having some heavy vaginal discharge and lower abd pain

## 2018-04-09 NOTE — ED Provider Notes (Signed)
[facility] Emergency Department Provider Note  ____________________________________________  Time seen: Approximately 3:59 PM  I have reviewed the triage vital signs and the nursing notes.   HISTORY  Chief Complaint Vaginal Discharge and Back Pain    HPI Karina Anderson is a 47 y.o. female that presents emergency department for evaluation of low back pain and white vaginal discharge for 1 week.  No fever, chills, abdominal pain, dysuria, urgency, frequency.  Past Medical History:  Diagnosis Date  . Diabetes mellitus without complication (HCC)   . Hydradenitis   . Seizures [facility](HCC)     Patient Active Problem List   Diagnosis Date Noted  . Tobacco use disorder 03/13/2016  . Cannabis use disorder, moderate, dependence (HCC) 03/13/2016  . Bipolar affective disorder, manic, severe, with psychotic behavior (HCC) 03/12/2016  . Seizures (HCC) 03/11/2016  . Involuntary commitment 03/11/2016    History reviewed. No pertinent surgical history.  Prior to Admission medications   Medication Sig Start Date End Date Taking? Authorizing Provider  cephALEXin (KEFLEX) 500 MG capsule Take 1 capsule (500 mg total) by mouth 2 (two) times daily for 10 days. 04/09/18 04/19/18  Enid DerryWagner, Myrian Botello, PA-C  dicyclomine (BENTYL) 20 MG tablet Take 1 tablet (20 mg total) by mouth 3 (three) times daily as needed for spasms. 09/27/17 09/27/18  Myrna BlazerSchaevitz, David Matthew, MD  doxylamine, Sleep, (UNISOM) 25 MG tablet Take 50 mg by mouth at bedtime as needed.    [provider]  famotidine (PEPCID) 40 MG tablet Take 1 tablet (40 mg total) by mouth every evening. 09/27/17 09/27/18  Myrna BlazerSchaevitz, David Matthew, MD  famotidine (PEPCID) 40 MG tablet Take 1 tablet (40 mg total) by mouth every evening. 12/25/17 12/25/18  Rebecka ApleyWebster, Allison P, MD  hydrOXYzine (ATARAX/VISTARIL) 50 MG tablet Take 1 tablet (50 mg total) by mouth every 6 (six) hours as needed for anxiety. Patient not taking: Reported on  07/23/2017 03/29/16   Pucilowska, Ellin GoodieJolanta B, MD  methocarbamol (ROBAXIN) 500 MG tablet Take 1 tablet (500 mg total) by mouth 4 (four) times daily. 11/18/17   Cuthriell, Delorise RoyalsJonathan D, PA-C  metoCLOPramide (REGLAN) 10 MG tablet Take 1 tablet (10 mg total) by mouth every 8 (eight) hours as needed. 12/25/17   Rebecka ApleyWebster, Allison P, MD  metroNIDAZOLE (FLAGYL) 500 MG tablet Take 1 tablet (500 mg total) by mouth 2 (two) times daily for 7 days. 04/09/18 04/16/18  Enid DerryWagner, Norman Bier, PA-C  phenazopyridine (PYRIDIUM) 200 MG tablet Take 1 tablet (200 mg total) by mouth 3 (three) times daily as needed for pain. Patient not taking: Reported on 07/23/2017 05/19/16   Menshew, Charlesetta IvoryJenise V Bacon, PA-C  phenytoin (DILANTIN) 100 MG ER capsule Take 1 capsule (100 mg total) by mouth 3 (three) times daily. Patient not taking: Reported on 07/23/2017 03/29/16   Shari ProwsPucilowska, Jolanta B, MD  predniSONE (DELTASONE) 50 MG tablet Take 1 tablet (50 mg total) by mouth daily with breakfast. 11/18/17   Cuthriell, Delorise RoyalsJonathan D, PA-C  QUEtiapine (SEROQUEL) 400 MG tablet Take 2 tablets (800 mg total) by mouth at bedtime. Patient not taking: Reported on 07/23/2017 03/29/16   Pucilowska, Braulio ConteJolanta B, MD  sucralfate (CARAFATE) 1 g tablet Take 1 tablet (1 g total) by mouth 2 (two) times daily. 12/25/17   Rebecka ApleyWebster, Allison P, MD  traMADol (ULTRAM) 50 MG tablet Take 1 tablet (50 mg total) by mouth every 6 (six) hours as needed for up to 2 days. 04/09/18 04/11/18  Enid DerryWagner, Dre Gamino, PA-C    Allergies Bactrim [sulfamethoxazole-trimethoprim]; Butorphanol tartrate; Food; Ibuprofen;  Ketorolac; Lorazepam; Naproxen; Prochlorperazine; and Nsaids  No family history on file.  Social History Social History   Tobacco Use  . Smoking status: Current Every Day Smoker    Packs/day: 0.50    Types: Cigarettes  . Smokeless tobacco: Never Used  Substance Use Topics  . Alcohol use: No  . Drug use: No     Review of Systems  Cardiovascular: No chest pain. Respiratory:  No  SOB. Gastrointestinal: No abdominal pain.  No nausea, no vomiting.  Genitourinary: Negative for dysuria. Musculoskeletal: Positive for back pain Skin: Negative for rash, abrasions, lacerations, ecchymosis.   ____________________________________________   PHYSICAL EXAM:  VITAL SIGNS: ED Triage Vitals  Enc Vitals Group     BP 04/09/18 1421 129/83     Pulse Rate 04/09/18 1421 100     Resp 04/09/18 1421 14     Temp 04/09/18 1421 98.3 F (36.8 C)     Temp Source 04/09/18 1421 Oral     SpO2 04/09/18 1421 100 %     Weight 04/09/18 1422 180 lb (81.6 kg)     Height 04/09/18 1422 5\' 5"  (1.651 m)     Head Circumference --      Peak Flow --      Pain Score 04/09/18 1421 9     Pain Loc --      Pain Edu? --      Excl. in GC? --      Constitutional: Alert and oriented. Well appearing and in no acute distress. Eyes: Conjunctivae are normal. PERRL. EOMI. Head: Atraumatic. ENT:      Ears:      Nose: No congestion/rhinnorhea.      Mouth/Throat: Mucous membranes are moist.  Neck: No stridor.   Cardiovascular: Normal rate, regular rhythm.  Good peripheral circulation. Respiratory: Normal respiratory effort without tachypnea or retractions. Lungs CTAB. Good air entry to the bases with no decreased or absent breath sounds. Gastrointestinal: Bowel sounds 4 quadrants. Soft and nontender to palpation. No guarding or rigidity. No palpable masses. No distention. No CVA tenderness. Genitourinary: No external rashes or lesions. White vaginal discharge. No cervical motion tenderness.  Musculoskeletal: Full range of motion to all extremities. No gross deformities appreciated. Neurologic:  Normal speech and language. No gross focal neurologic deficits are appreciated.  Skin:  Skin is warm, dry and intact. No rash noted. Psychiatric: Mood and affect are normal. Speech and behavior are normal. Patient exhibits appropriate insight and judgement.   ____________________________________________    LABS (all labs ordered are listed, but only abnormal results are displayed)  Labs Reviewed  WET PREP, GENITAL - Abnormal; Notable for the following components:      Result Value   Clue Cells Wet Prep HPF POC PRESENT (*)    WBC, Wet Prep HPF POC RARE (*)    All other components within normal limits  URINALYSIS, ROUTINE W REFLEX MICROSCOPIC - Abnormal; Notable for the following components:   Color, Urine YELLOW (*)    APPearance CLOUDY (*)    Leukocytes, UA TRACE (*)    Bacteria, UA RARE (*)    All other components within normal limits  CHLAMYDIA/NGC RT PCR ([facility] ONLY)  POC URINE PREG, ED  POCT PREGNANCY, URINE   ____________________________________________  EKG   ____________________________________________  RADIOLOGY   No results found.  ____________________________________________    PROCEDURES  Procedure(s) performed:    Procedures    Medications - No data to display   ____________________________________________   INITIAL IMPRESSION / ASSESSMENT AND PLAN /  ED COURSE  Pertinent labs & imaging results that were available during my care of the patient were reviewed by me and considered in my medical decision making (see chart for details).  Review of the [place] CSRS was performed in accordance of the [facility] prior to dispensing any controlled drugs.     Patient's diagnosis is consistent with bacterial vaginosis and UTI. Vital signs and exam are reassuring. Gonorrhea and chlamydia are negative. Patient will be discharged home with prescriptions for flagyl, keflex, and tramadol. Patient is to follow up with PCP as directed. Patient is given ED precautions to return to the ED for any worsening or new symptoms.     ____________________________________________  FINAL CLINICAL IMPRESSION(S) / ED DIAGNOSES  Final diagnoses:  Bacterial vaginosis  Lower urinary tract infectious disease      NEW MEDICATIONS STARTED DURING THIS VISIT:  ED  Discharge Orders         Ordered    cephALEXin (KEFLEX) 500 MG capsule  2 times daily,   Status:  Discontinued     04/09/18 1654    metroNIDAZOLE (FLAGYL) 500 MG tablet  2 times daily,   Status:  Discontinued     04/09/18 1654    traMADol (ULTRAM) 50 MG tablet  Every 6 hours PRN,   Status:  Discontinued     04/09/18 1654    cephALEXin (KEFLEX) 500 MG capsule  2 times daily     04/09/18 1657    metroNIDAZOLE (FLAGYL) 500 MG tablet  2 times daily     04/09/18 1657    traMADol (ULTRAM) 50 MG tablet  Every 6 hours PRN     04/09/18 1657              This chart was dictated using voice recognition software/Dragon. Despite best efforts to proofread, errors can occur which can change the meaning. Any change was purely unintentional.    Enid DerryWagner, Kamika Goodloe, PA-C 04/09/18 1826    Jene EveryKinner, Robert, MD 04/10/18 1407

## 2018-04-09 NOTE — ED Triage Notes (Signed)
Says vaginal discharge with low back pain.  No fever. Has vaginal discharge for 1 week.  Denies abdominal pain

## 2018-09-28 ENCOUNTER — Encounter: Payer: Self-pay | Admitting: Emergency Medicine

## 2018-09-28 ENCOUNTER — Emergency Department
Admission: EM | Admit: 2018-09-28 | Discharge: 2018-09-28 | Disposition: A | Discharge status: Home / Self Care | Payer: Medicaid Other | Attending: Student in an Organized Health Care Education/Training Program | Admitting: Student in an Organized Health Care Education/Training Program

## 2018-09-28 ENCOUNTER — Other Ambulatory Visit: Payer: Self-pay

## 2018-09-28 DIAGNOSIS — F17210 Nicotine dependence, cigarettes, uncomplicated: Secondary | ICD-10-CM | POA: Insufficient documentation

## 2018-09-28 DIAGNOSIS — Z79899 Other long term (current) drug therapy: Secondary | ICD-10-CM | POA: Diagnosis not present

## 2018-09-28 DIAGNOSIS — M5442 Lumbago with sciatica, left side: Secondary | ICD-10-CM | POA: Diagnosis not present

## 2018-09-28 DIAGNOSIS — M5441 Lumbago with sciatica, right side: Secondary | ICD-10-CM | POA: Diagnosis not present

## 2018-09-28 DIAGNOSIS — E119 Type 2 diabetes mellitus without complications: Secondary | ICD-10-CM | POA: Diagnosis not present

## 2018-09-28 MED ORDER — LIDOCAINE 5 % EX PTCH: 1.0000 | MEDICATED_PATCH | CUTANEOUS | 0 refills | Status: DC

## 2018-09-28 MED ORDER — OXYCODONE-ACETAMINOPHEN 5-325 MG PO TABS
1.0000 | ORAL_TABLET | Freq: Once | ORAL | Status: AC
  Administered 2018-09-28: 1 via ORAL
  Filled 2018-09-28: qty 1

## 2018-09-28 MED ORDER — CYCLOBENZAPRINE HCL 5 MG PO TABS: ORAL_TABLET | ORAL | 0 refills | Status: DC

## 2018-09-28 MED ORDER — METHYLPREDNISOLONE SODIUM SUCC 125 MG IJ SOLR
125.0000 mg | Freq: Once | INTRAMUSCULAR | Status: AC
  Administered 2018-09-28: 125 mg via INTRAMUSCULAR
  Filled 2018-09-28: qty 2

## 2018-09-28 MED ORDER — PREDNISONE 50 MG PO TABS: ORAL_TABLET | ORAL | 0 refills | Status: DC

## 2018-09-28 NOTE — ED Triage Notes (Signed)
Pt in via POV, reports lower back pain x approximately 3 weeks w/ hx of herniated disc.  Vitals WDL.  Ambulatory to triage. NAD noted at this time.

## 2018-09-28 NOTE — ED Provider Notes (Signed)
[facility] Emergency Department Provider Note  ____________________________________________  Time seen: Approximately 12:59 PM  I have reviewed the triage vital signs and the nursing notes.   HISTORY  Chief Complaint Back Pain    HPI Karina Anderson is a 48 y.o. female that presents emergency department for evaluation of chronic back pain for 13 years that has worsened for the last 3 weeks.  Patient states that back pain started with the birth of her last child.  Pain occasionally radiates into both legs.  Patient states that she has been busy picking up her [id number]-month-old granddaughter.  No bowel or bladder dysfunction or saddle anesthesias.  No urinary symptoms.  She has taken over-the-counter pain medication.  Patient denies history of diabetes.  No fever, nausea, vomiting, abdominal pain, numbness, tingling.   Past Medical History:  Diagnosis Date  . Diabetes mellitus without complication (HCC)   . Hydradenitis   . Seizures [facility](HCC)     Patient Active Problem List   Diagnosis Date Noted  . Tobacco use disorder 03/13/2016  . Cannabis use disorder, moderate, dependence (HCC) 03/13/2016  . Bipolar affective disorder, manic, severe, with psychotic behavior (HCC) 03/12/2016  . Seizures (HCC) 03/11/2016  . Involuntary commitment 03/11/2016    History reviewed. No pertinent surgical history.  Prior to Admission medications   Medication Sig Start Date End Date Taking? Authorizing Provider  cyclobenzaprine (FLEXERIL) 5 MG tablet Take 1-2 tablets 3 times daily as needed 09/28/18   Enid DerryWagner, Kalliopi Coupland, PA-C  dicyclomine (BENTYL) 20 MG tablet Take 1 tablet (20 mg total) by mouth 3 (three) times daily as needed for spasms. 09/27/17 09/27/18  Myrna BlazerSchaevitz, David Matthew, MD  doxylamine, Sleep, (UNISOM) 25 MG tablet Take 50 mg by mouth at bedtime as needed.    [provider]  famotidine (PEPCID) 40 MG tablet Take 1 tablet (40 mg total) by mouth every evening. 09/27/17  09/27/18  Myrna BlazerSchaevitz, David Matthew, MD  famotidine (PEPCID) 40 MG tablet Take 1 tablet (40 mg total) by mouth every evening. 12/25/17 12/25/18  Rebecka ApleyWebster, Allison P, MD  hydrOXYzine (ATARAX/VISTARIL) 50 MG tablet Take 1 tablet (50 mg total) by mouth every 6 (six) hours as needed for anxiety. Patient not taking: Reported on 07/23/2017 03/29/16   Pucilowska, Braulio ConteJolanta B, MD  lidocaine (LIDODERM) 5 % Place 1 patch onto the skin daily. Remove & Discard patch within 12 hours or as directed by MD 09/28/18   Enid DerryWagner, Badr Piedra, PA-C  methocarbamol (ROBAXIN) 500 MG tablet Take 1 tablet (500 mg total) by mouth 4 (four) times daily. 11/18/17   Cuthriell, Delorise RoyalsJonathan D, PA-C  metoCLOPramide (REGLAN) 10 MG tablet Take 1 tablet (10 mg total) by mouth every 8 (eight) hours as needed. 12/25/17   Rebecka ApleyWebster, Allison P, MD  phenazopyridine (PYRIDIUM) 200 MG tablet Take 1 tablet (200 mg total) by mouth 3 (three) times daily as needed for pain. Patient not taking: Reported on 07/23/2017 05/19/16   Menshew, Charlesetta IvoryJenise V Bacon, PA-C  phenytoin (DILANTIN) 100 MG ER capsule Take 1 capsule (100 mg total) by mouth 3 (three) times daily. Patient not taking: Reported on 07/23/2017 03/29/16   Shari ProwsPucilowska, Jolanta B, MD  predniSONE (DELTASONE) 50 MG tablet Take 1 tab per day 09/29/18   Enid DerryWagner, Abcde Oneil, PA-C  QUEtiapine (SEROQUEL) 400 MG tablet Take 2 tablets (800 mg total) by mouth at bedtime. Patient not taking: Reported on 07/23/2017 03/29/16   Pucilowska, Braulio ConteJolanta B, MD  sucralfate (CARAFATE) 1 g tablet Take 1 tablet (1 g total) by mouth 2 (  two) times daily. 12/25/17   Rebecka Apley, MD    Allergies Bactrim [sulfamethoxazole-trimethoprim]; Butorphanol tartrate; Food; Ibuprofen; Ketorolac; Lorazepam; Naproxen; Prochlorperazine; and Nsaids  No family history on file.  Social History Social History   Tobacco Use  . Smoking status: Current Every Day Smoker    Packs/day: 0.50    Types: Cigarettes  . Smokeless tobacco: Never Used  Substance Use  Topics  . Alcohol use: No  . Drug use: No     Review of Systems  Constitutional: No fever/chills ENT: No upper respiratory complaints. Cardiovascular: No chest pain. Respiratory: No SOB. Gastrointestinal: No abdominal pain.  No nausea, no vomiting.  Musculoskeletal: Positive for back pain. Skin: Negative for rash, abrasions, lacerations, ecchymosis. Neurological: Negative for headaches, numbness or tingling   ____________________________________________   PHYSICAL EXAM:  VITAL SIGNS: ED Triage Vitals  Enc Vitals Group     BP 09/28/18 1147 124/82     Pulse Rate 09/28/18 1147 94     Resp 09/28/18 1147 16     Temp 09/28/18 1147 98.2 F (36.8 C)     Temp Source 09/28/18 1147 Oral     SpO2 09/28/18 1147 100 %     Weight 09/28/18 1148 179 lb (81.2 kg)     Height 09/28/18 1148 5\' 5"  (1.651 m)     Head Circumference --      Peak Flow --      Pain Score 09/28/18 1147 10     Pain Loc --      Pain Edu? --      Excl. in GC? --      Constitutional: Alert and oriented. Well appearing and in no acute distress. Eyes: Conjunctivae are normal. PERRL. EOMI. Head: Atraumatic. ENT:      Ears:      Nose: No congestion/rhinnorhea.      Mouth/Throat: Mucous membranes are moist.  Neck: No stridor.  Cardiovascular: Normal rate, regular rhythm.  Good peripheral circulation. Respiratory: Normal respiratory effort without tachypnea or retractions. Lungs CTAB. Good air entry to the bases with no decreased or absent breath sounds. Gastrointestinal: Bowel sounds 4 quadrants. Soft and nontender to palpation. No guarding or rigidity. No palpable masses. No distention. Musculoskeletal: Full range of motion to all extremities. No gross deformities appreciated.  Minimal tenderness to palpation diffusely over lumbar spine and lumbar paraspinal muscles.  Positive straight leg raise.  Strength equal in lower extremities bilaterally.  Normal gait. Neurologic:  Normal speech and language. No gross  focal neurologic deficits are appreciated.  Skin:  Skin is warm, dry and intact. No rash noted. Psychiatric: Mood and affect are normal. Speech and behavior are normal. Patient exhibits appropriate insight and judgement.   ____________________________________________   LABS (all labs ordered are listed, but only abnormal results are displayed)  Labs Reviewed - No data to display ____________________________________________  EKG   ____________________________________________  RADIOLOGY   No results found.  ____________________________________________    PROCEDURES  Procedure(s) performed:    Procedures    Medications  methylPREDNISolone sodium succinate (SOLU-MEDROL) 125 mg/2 mL injection 125 mg (125 mg Intramuscular Given 09/28/18 1249)  oxyCODONE-acetaminophen (PERCOCET/ROXICET) 5-325 MG per tablet 1 tablet (1 tablet Oral Given 09/28/18 1248)     ____________________________________________   INITIAL IMPRESSION / ASSESSMENT AND PLAN / ED COURSE  Pertinent labs & imaging results that were available during my care of the patient were reviewed by me and considered in my medical decision making (see chart for details).  Review of the [place]  CSRS was performed in accordance of the [facility] prior to dispensing any controlled drugs.     Patient's diagnosis is consistent with sciatica. She cannot take NSAIDs. Patient will be discharged home with prescriptions for prednisone, lidoderm, flexeril. Patient is to follow up with PCP as directed. Patient is given ED precautions to return to the ED for any worsening or new symptoms.     ____________________________________________  FINAL CLINICAL IMPRESSION(S) / ED DIAGNOSES  Final diagnoses:  Acute bilateral low back pain with bilateral sciatica      NEW MEDICATIONS STARTED DURING THIS VISIT:  ED Discharge Orders         Ordered    predniSONE (DELTASONE) 50 MG tablet     09/28/18 1309    cyclobenzaprine (FLEXERIL) 5  MG tablet     09/28/18 1309    lidocaine (LIDODERM) 5 %  Every 24 hours     09/28/18 1309              This chart was dictated using voice recognition software/Dragon. Despite best efforts to proofread, errors can occur which can change the meaning. Any change was purely unintentional.    Enid Derry, PA-C 09/28/18 1624    Willy Eddy, MD 09/29/18 1136

## 2018-09-28 NOTE — ED Notes (Signed)
Pt c/o lower back pain since her last birth that was 13 years ago. Pt reports pain all the time ever while resting.

## 2018-10-12 IMAGING — US US TRANSVAGINAL NON-OB
1 series · 13 of 25 positions shown · non-contrast
Comparison: CT scan of November 24, 2015.

CLINICAL DATA: Right lower quadrant abdominal pain.

EXAM:
TRANSABDOMINAL AND TRANSVAGINAL ULTRASOUND OF PELVIS
DOPPLER ULTRASOUND OF OVARIES
TECHNIQUE: Both transabdominal and transvaginal ultrasound examinations of the
pelvis were performed. Transabdominal technique was performed for
global imaging of the pelvis including uterus, ovaries, adnexal
regions, and pelvic cul-de-sac.
It was necessary to proceed with endovaginal exam following the
transabdominal exam to visualize the ovaries. Color and duplex
Doppler ultrasound was utilized to evaluate blood flow to the
ovaries.

[Series 1: us transvaginal non-ob · 0.20mm/px · 13 of 81 slices shown]
[im 1/81]
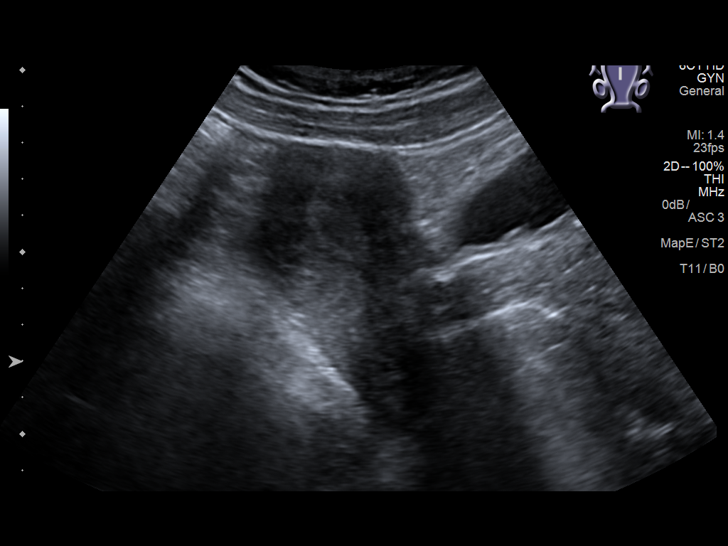
[im 7/81]
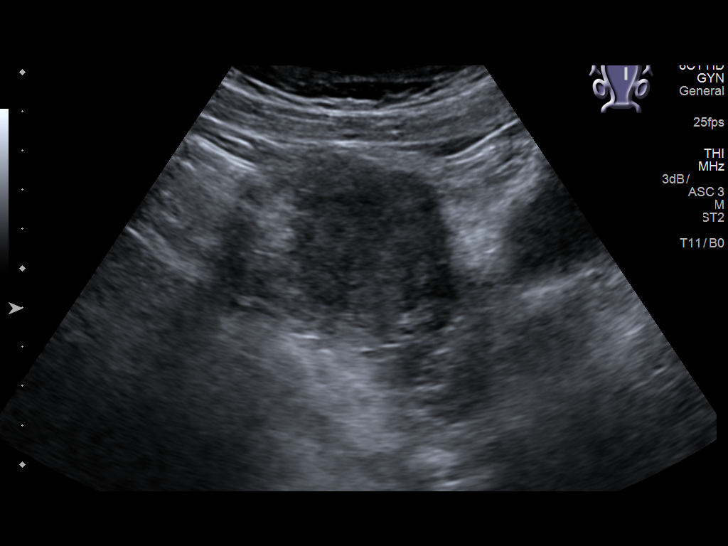
[im 14/81]
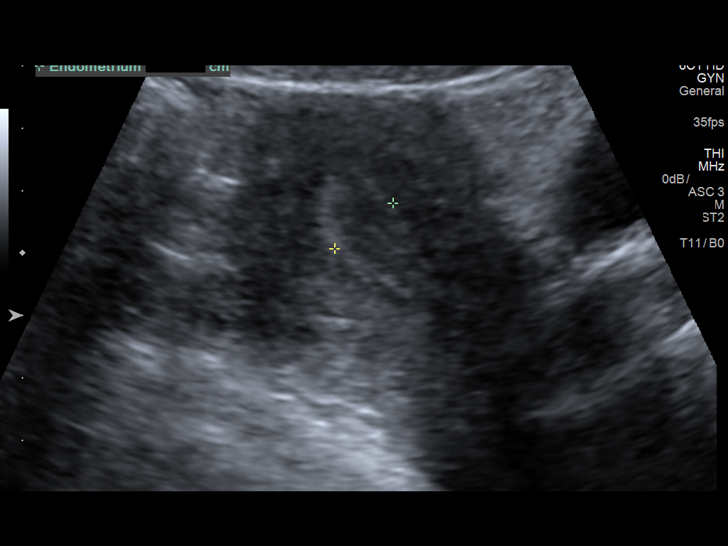
[im 21/81]
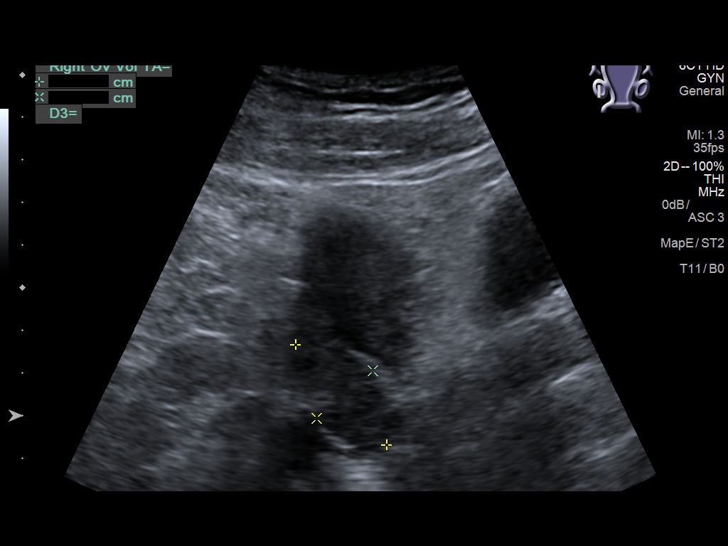
[im 27/81]
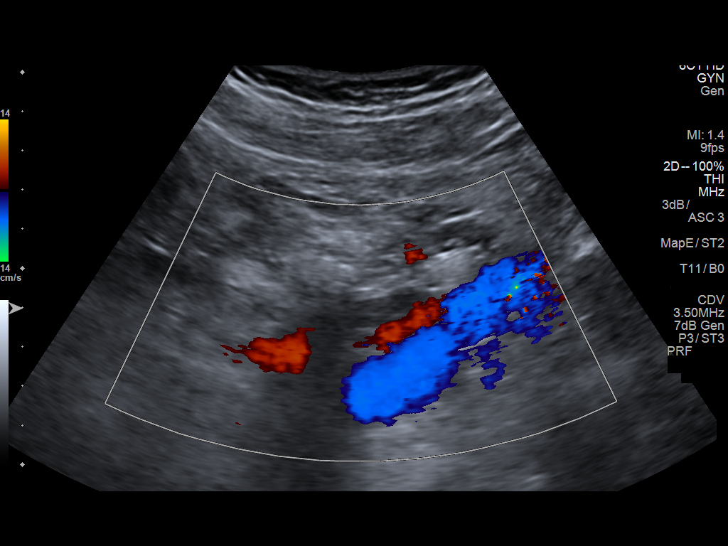
[im 34/81]
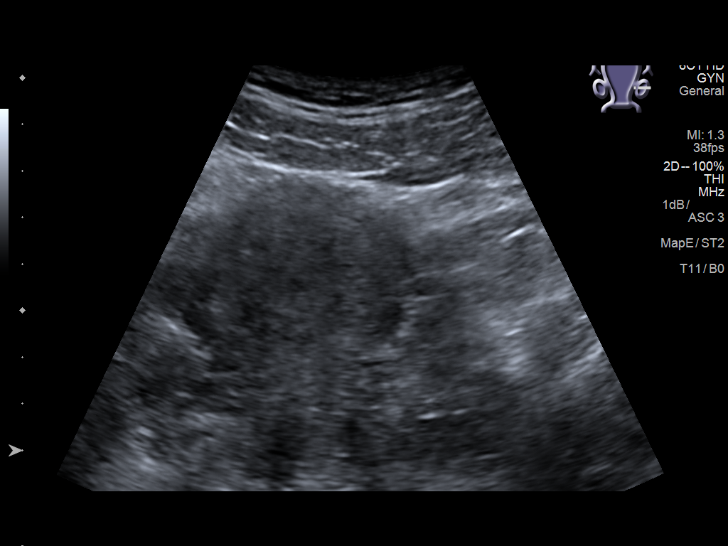
[im 41/81]
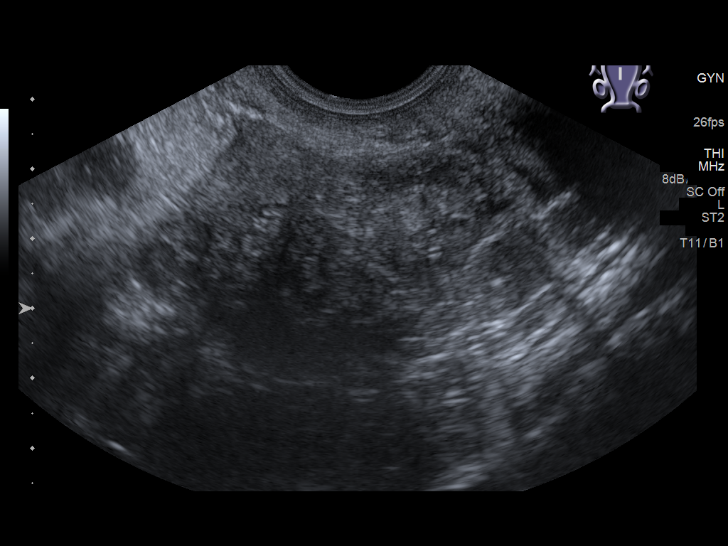
[im 47/81]
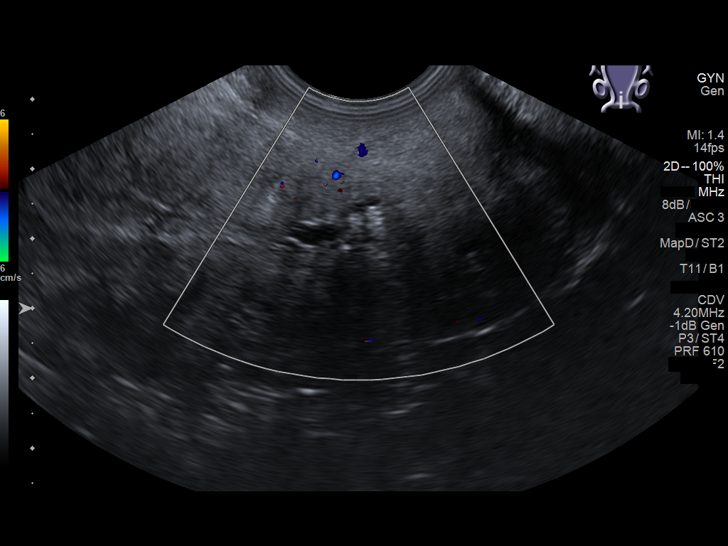
[im 54/81]
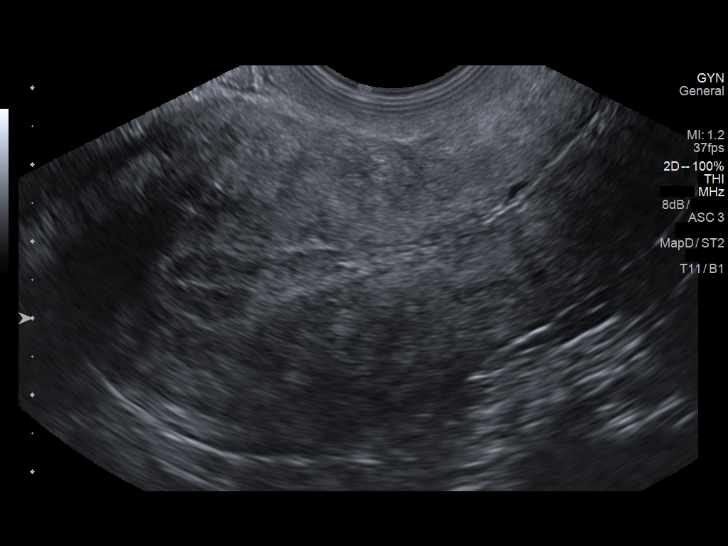
[im 61/81]
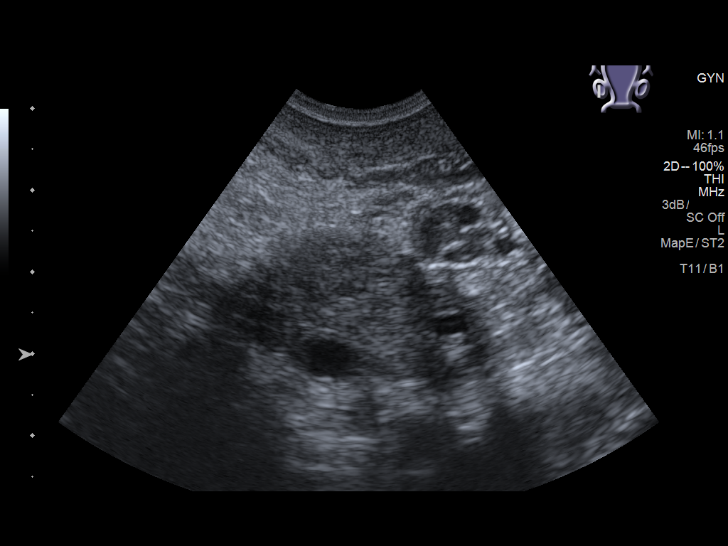
[im 67/81]
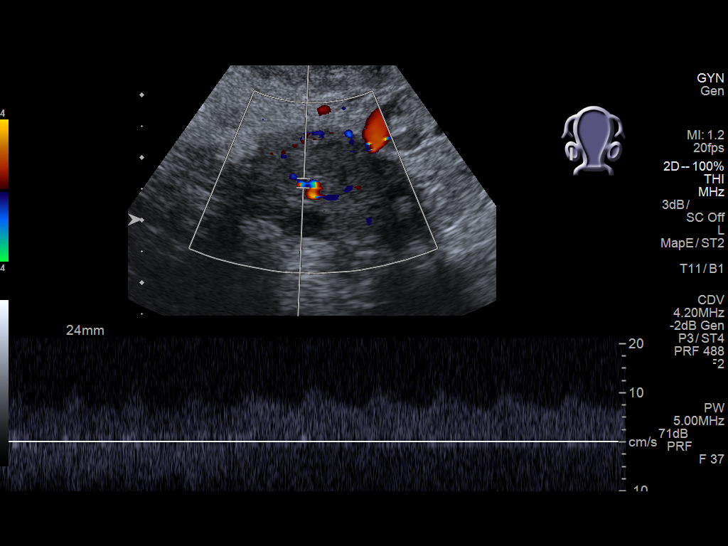
[im 74/81]
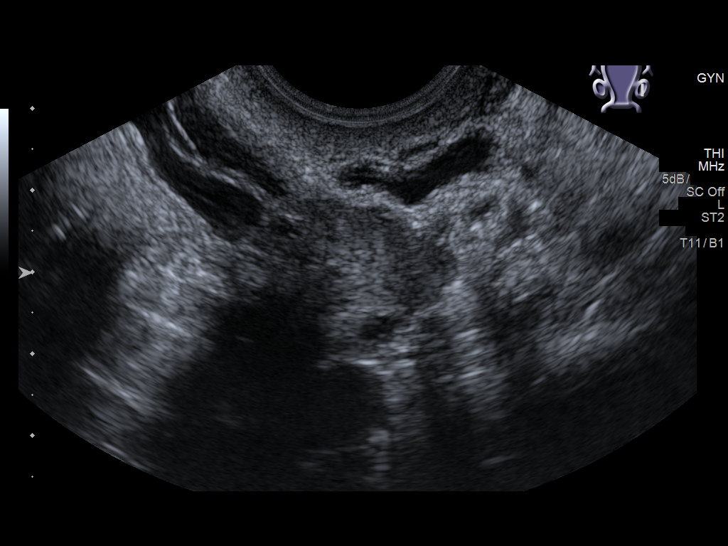
[im 81/81]
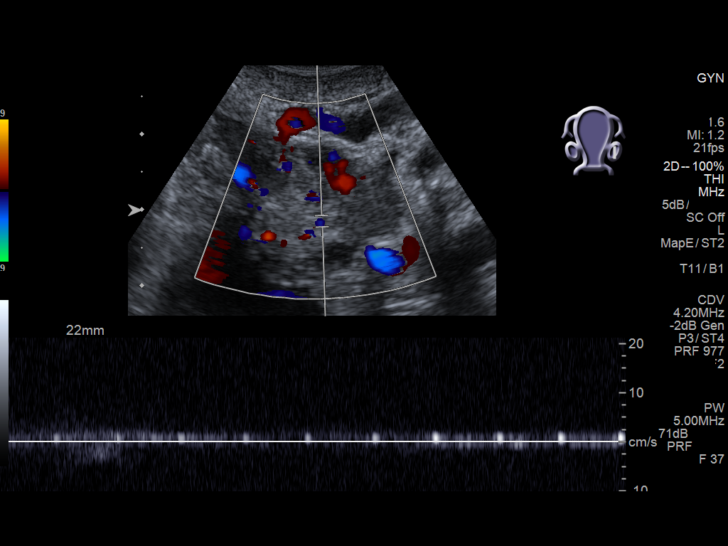

[13 of 25 positions shown; findings below may reference images not displayed]

FINDINGS: Uterus

Measurements: 8.7 x 4.7 x 4.7 cm. No fibroids or other mass
visualized.

Endometrium

Thickness: 11.1 mm which is within normal limits. No focal
abnormality visualized.

Right ovary

Measurements: 3.0 x 2.2 x 1.9 cm. Normal appearance/no adnexal mass.

Left ovary

Measurements: 2.6 x 1.8 x 1.5 cm. Normal appearance/no adnexal mass.

Pulsed Doppler evaluation of both ovaries demonstrates normal
low-resistance arterial and venous waveforms.

Other findings

No abnormal free fluid.
IMPRESSION: Uterus and ovaries appear normal. Enlarged pelvic varices are noted
in the left adnexal region suggesting pelvic congestion syndrome.

## 2018-10-22 IMAGING — CT CT ABD-PELV W/ CM
2 of 5 series · 16 of 46 positions shown, 18 images · IV contrast (APPLIED)
Comparison: CT the abdomen and pelvis 11/16/2015.

CLINICAL DATA: 46-year-old female with history of increasing right
lower quadrant pain for the past week.

EXAM:
CT ABDOMEN AND PELVIS WITH CONTRAST
TECHNIQUE: Multidetector CT imaging of the abdomen and pelvis was performed
using the standard protocol following bolus administration of
intravenous contrast.
CONTRAST:  100mL BDCP2B-3PP IOPAMIDOL (BDCP2B-3PP) INJECTION 61%

[Series 2: routine abd/pel with · axial · 0.80mm/px · z∈[-468,-63]mm · 13 of 91 slices shown, 15 images]
[im 5/91  soft-tissue]
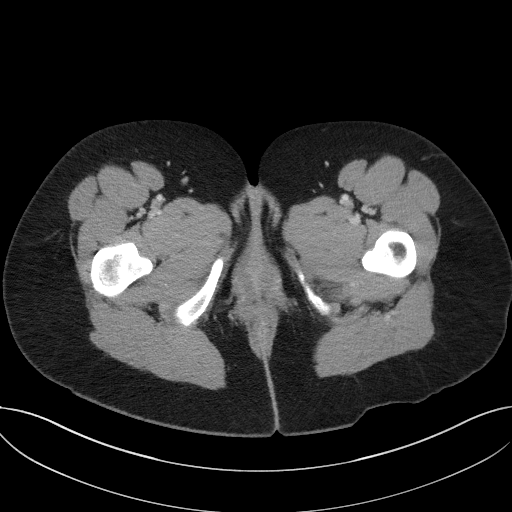
[im 5/91  bone]
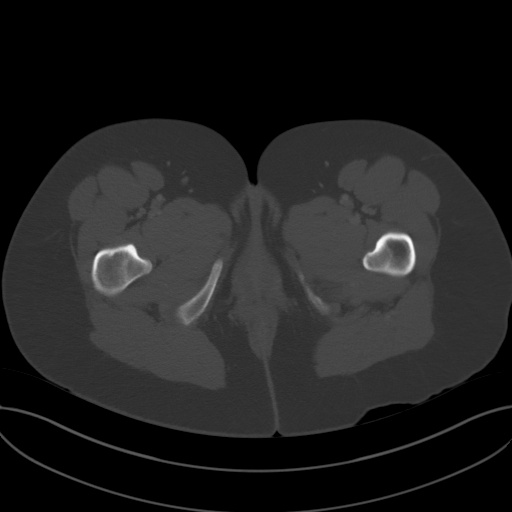
[im 15/91  soft-tissue]
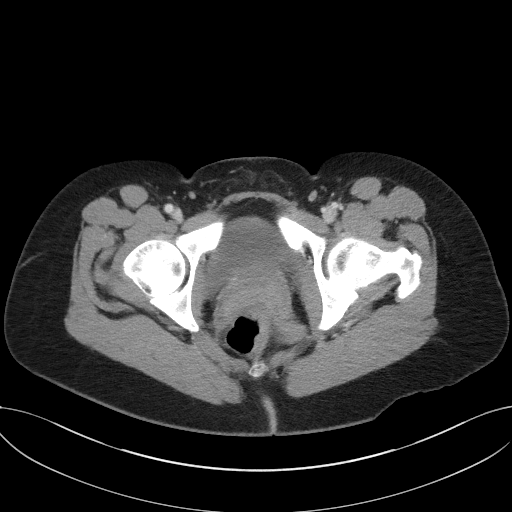
[im 19/91  soft-tissue]
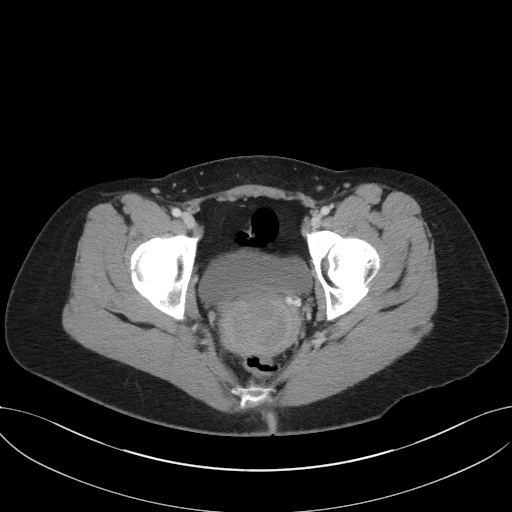
[im 24/91  soft-tissue]
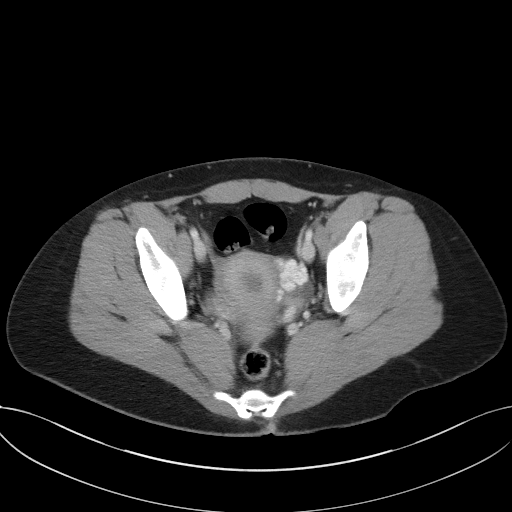
[im 34/91  soft-tissue]
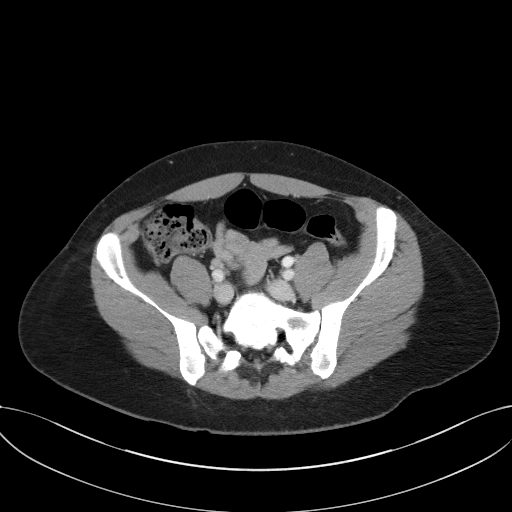
[im 38/91  soft-tissue]
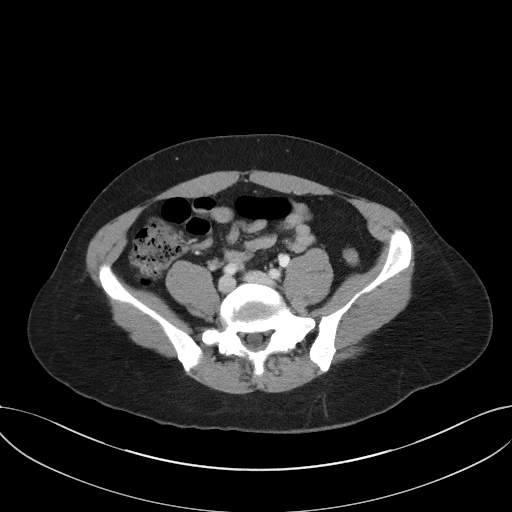
[im 48/91  soft-tissue]
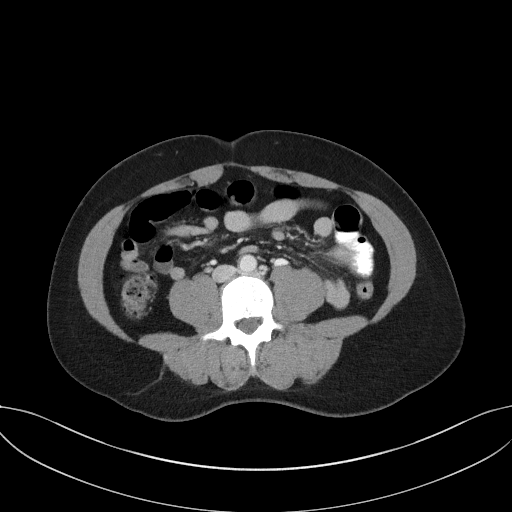
[im 53/91  soft-tissue]
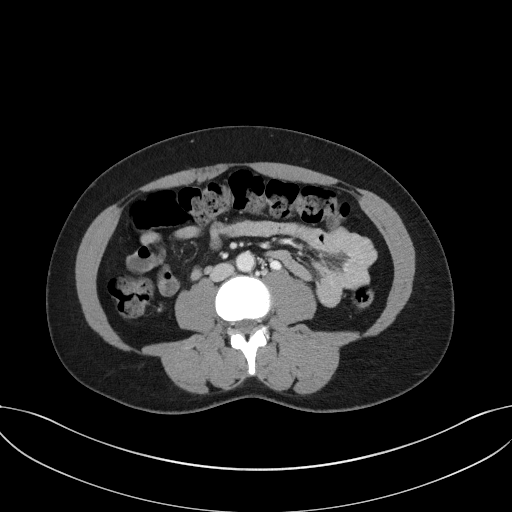
[im 57/91  soft-tissue]
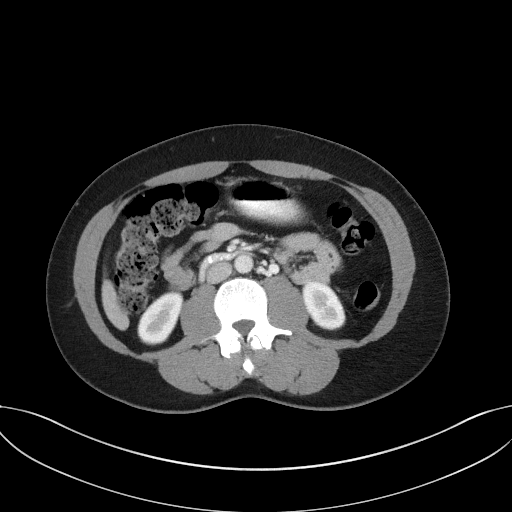
[im 57/91  bone]
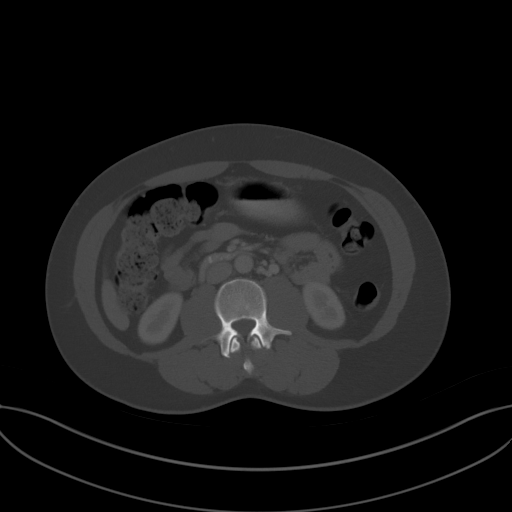
[im 67/91  soft-tissue]
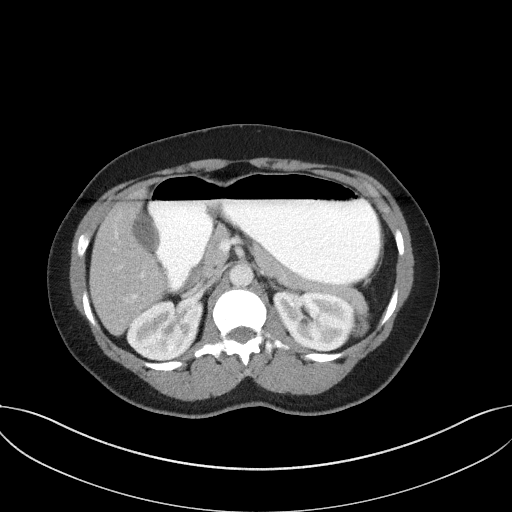
[im 72/91  soft-tissue]
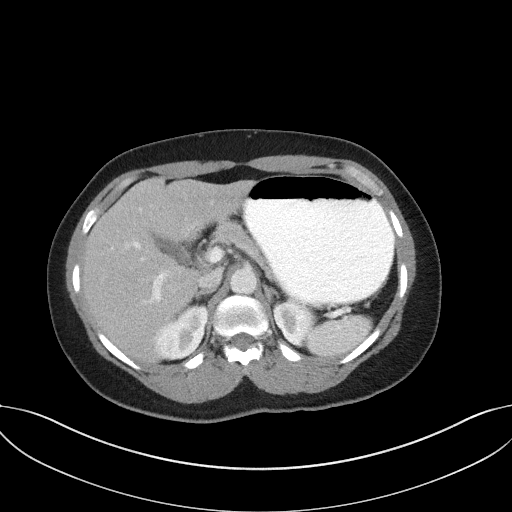
[im 76/91  soft-tissue]
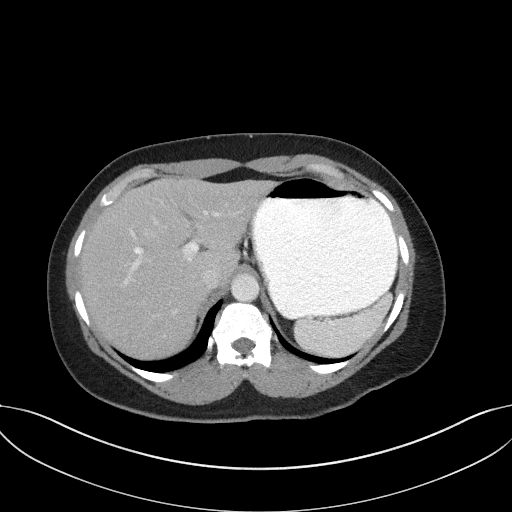
[im 86/91  soft-tissue]
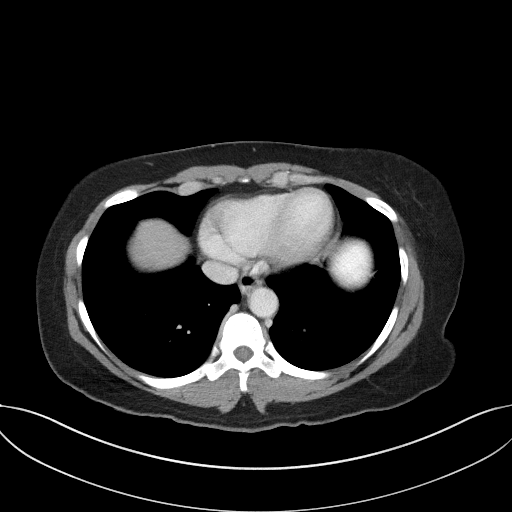

[Series 6: coronal st · coronal · 0.72mm/px · 3 of 80 slices shown]
[im 27/80  soft-tissue]
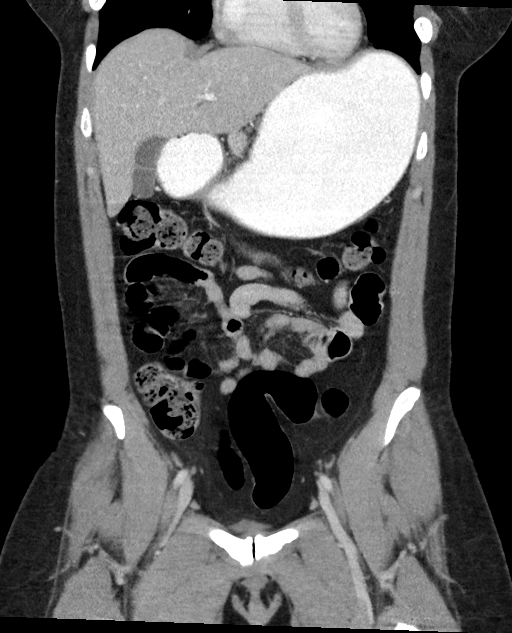
[im 36/80  soft-tissue]
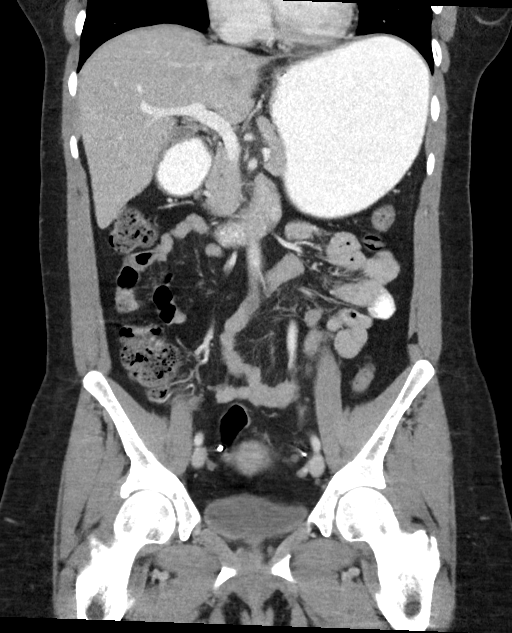
[im 44/80  soft-tissue]
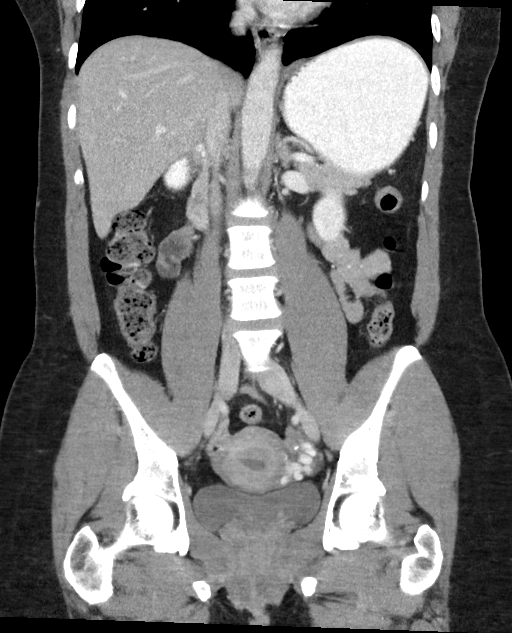

[16 of 46 positions shown; findings below may reference images not displayed]

FINDINGS: Lower chest: Unremarkable.

Hepatobiliary: No cystic or solid hepatic lesions. No intra or
extrahepatic biliary ductal dilatation. Gallbladder is normal in
appearance.

Pancreas: No pancreatic mass. No pancreatic ductal dilatation. No
pancreatic or peripancreatic fluid or inflammatory changes.

Spleen: Unremarkable.

Adrenals/Urinary Tract: Bilateral kidneys and bilateral adrenal
glands are normal in appearance. Urinary bladder is normal in
appearance.

Stomach/Bowel: Normal appearance of the stomach. No pathologic
dilatation of small bowel or colon. The appendix is not confidently
identified and may be surgically absent. Regardless, there are no
inflammatory changes noted adjacent to the cecum to suggest the
presence of an acute appendicitis at this time.

Vascular/Lymphatic: No significant atherosclerotic disease, aneurysm
or dissection noted in the abdominal or pelvic vasculature.
Asymmetric prominence of the left gonadal vein is noted. No
lymphadenopathy noted in the abdomen or pelvis.

Reproductive: Uterus and ovaries are unremarkable in appearance.
Bilateral tubal ligation clips are incidentally noted.

Other: No significant volume of ascites.  No pneumoperitoneum.

Musculoskeletal: There are no aggressive appearing lytic or blastic
lesions noted in the visualized portions of the skeleton.
IMPRESSION: 1. No acute findings are noted in the abdomen or pelvis to account
for the patient's symptoms.
2. The appendix is not confidently identified may be surgically
absent. Regardless, there are no inflammatory changes noted adjacent
to the cecum to suggest the presence of an acute appendicitis at
this time.
3. Marked prominence of the left gonadal vein, markedly asymmetric
with the contralateral side. This is nonspecific, but can be seen in
the setting of pelvic congestion syndrome.

## 2019-02-03 ENCOUNTER — Emergency Department
Admission: EM | Admit: 2019-02-03 | Discharge: 2019-02-03 | Disposition: A | Discharge status: Home / Self Care | Payer: Medicaid Other | Attending: Emergency Medicine | Admitting: Emergency Medicine

## 2019-02-03 ENCOUNTER — Emergency Department: Payer: Medicaid Other

## 2019-02-03 ENCOUNTER — Encounter: Payer: Self-pay | Admitting: Intensive Care

## 2019-02-03 ENCOUNTER — Other Ambulatory Visit: Payer: Self-pay

## 2019-02-03 DIAGNOSIS — R102 Pelvic and perineal pain: Secondary | ICD-10-CM | POA: Insufficient documentation

## 2019-02-03 DIAGNOSIS — E119 Type 2 diabetes mellitus without complications: Secondary | ICD-10-CM | POA: Diagnosis not present

## 2019-02-03 DIAGNOSIS — R569 Unspecified convulsions: Secondary | ICD-10-CM | POA: Diagnosis not present

## 2019-02-03 DIAGNOSIS — Z79899 Other long term (current) drug therapy: Secondary | ICD-10-CM | POA: Diagnosis not present

## 2019-02-03 DIAGNOSIS — F17210 Nicotine dependence, cigarettes, uncomplicated: Secondary | ICD-10-CM | POA: Diagnosis not present

## 2019-02-03 LAB — URINALYSIS, COMPLETE (UACMP) WITH MICROSCOPIC
Bacteria, UA: NONE SEEN
Bilirubin Urine: NEGATIVE
Glucose, UA: NEGATIVE mg/dL
Ketones, ur: NEGATIVE mg/dL
Leukocytes,Ua: NEGATIVE
Nitrite: NEGATIVE
Protein, ur: NEGATIVE mg/dL
RBC / HPF: >50 RBC/hpf — ABNORMAL HIGH (ref 0–5)
Specific Gravity, Urine: 1.024 (ref 1.005–1.030)
pH: 5 (ref 5.0–8.0)

## 2019-02-03 LAB — WET PREP, GENITAL
Clue Cells Wet Prep HPF POC: NONE SEEN
Sperm: NONE SEEN
Trich, Wet Prep: NONE SEEN
Yeast Wet Prep HPF POC: NONE SEEN

## 2019-02-03 LAB — CHLAMYDIA/NGC RT PCR (ARMC ONLY)
Chlamydia Tr: NOT DETECTED
N gonorrhoeae: NOT DETECTED

## 2019-02-03 LAB — CHLAMYDIA/NGC RT PCR (ARMC ONLY)??????????

## 2019-02-03 MED ORDER — OXYCODONE HCL 5 MG PO TABS
5.0000 mg | ORAL_TABLET | Freq: Once | ORAL | Status: AC
  Administered 2019-02-03: 20:00:00 5 mg via ORAL
  Filled 2019-02-03: qty 1

## 2019-02-03 NOTE — ED Notes (Signed)
Patient transported to Ultrasound 

## 2019-02-03 NOTE — ED Notes (Signed)
Pt back in room from ultrasound resting comfortably.

## 2019-02-03 NOTE — ED Provider Notes (Signed)
[facility] Emergency Department Provider Note  ____________________________________________  Time seen: Approximately 7:28 PM  I have reviewed the triage vital signs and the nursing notes.   HISTORY  Chief Complaint Abdominal Pain    HPI Karina Anderson is a 48 y.o. female who presents emergency department for treatment and evaluation of irregular menses with clots.  She is also having cramping abdominal pain.  Last menstrual cycle was 2 weeks ago.  She reports a history of a right ovarian cyst that was diagnosed approximately 6 months ago.  No alleviating measures attempted prior to arrival today.   Past Medical History:  Diagnosis Date  . Diabetes mellitus without complication (HCC)   . Hydradenitis   . Seizures [facility](HCC)     Patient Active Problem List   Diagnosis Date Noted  . Tobacco use disorder 03/13/2016  . Cannabis use disorder, moderate, dependence (HCC) 03/13/2016  . Bipolar affective disorder, manic, severe, with psychotic behavior (HCC) 03/12/2016  . Seizures (HCC) 03/11/2016  . Involuntary commitment 03/11/2016    History reviewed. No pertinent surgical history.  Prior to Admission medications   Medication Sig Start Date End Date Taking? Authorizing Provider  cyclobenzaprine (FLEXERIL) 5 MG tablet Take 1-2 tablets 3 times daily as needed 09/28/18   Enid DerryWagner, Ashley, PA-C  dicyclomine (BENTYL) 20 MG tablet Take 1 tablet (20 mg total) by mouth 3 (three) times daily as needed for spasms. 09/27/17 09/27/18  Myrna BlazerSchaevitz, David Matthew, MD  doxylamine, Sleep, (UNISOM) 25 MG tablet Take 50 mg by mouth at bedtime as needed.    [provider]  famotidine (PEPCID) 40 MG tablet Take 1 tablet (40 mg total) by mouth every evening. 09/27/17 09/27/18  Myrna BlazerSchaevitz, David Matthew, MD  famotidine (PEPCID) 40 MG tablet Take 1 tablet (40 mg total) by mouth every evening. 12/25/17 12/25/18  Rebecka ApleyWebster, Allison P, MD  hydrOXYzine (ATARAX/VISTARIL) 50 MG tablet Take 1  tablet (50 mg total) by mouth every 6 (six) hours as needed for anxiety. Patient not taking: Reported on 07/23/2017 03/29/16   Pucilowska, Braulio ConteJolanta B, MD  lidocaine (LIDODERM) 5 % Place 1 patch onto the skin daily. Remove & Discard patch within 12 hours or as directed by MD 09/28/18   Enid DerryWagner, Ashley, PA-C  methocarbamol (ROBAXIN) 500 MG tablet Take 1 tablet (500 mg total) by mouth 4 (four) times daily. 11/18/17   Cuthriell, Delorise RoyalsJonathan D, PA-C  metoCLOPramide (REGLAN) 10 MG tablet Take 1 tablet (10 mg total) by mouth every 8 (eight) hours as needed. 12/25/17   Rebecka ApleyWebster, Allison P, MD  phenazopyridine (PYRIDIUM) 200 MG tablet Take 1 tablet (200 mg total) by mouth 3 (three) times daily as needed for pain. Patient not taking: Reported on 07/23/2017 05/19/16   Menshew, Charlesetta IvoryJenise V Bacon, PA-C  phenytoin (DILANTIN) 100 MG ER capsule Take 1 capsule (100 mg total) by mouth 3 (three) times daily. Patient not taking: Reported on 07/23/2017 03/29/16   Shari ProwsPucilowska, Jolanta B, MD  predniSONE (DELTASONE) 50 MG tablet Take 1 tab per day 09/29/18   Enid DerryWagner, Ashley, PA-C  QUEtiapine (SEROQUEL) 400 MG tablet Take 2 tablets (800 mg total) by mouth at bedtime. Patient not taking: Reported on 07/23/2017 03/29/16   Pucilowska, Braulio ConteJolanta B, MD  sucralfate (CARAFATE) 1 g tablet Take 1 tablet (1 g total) by mouth 2 (two) times daily. 12/25/17   Rebecka ApleyWebster, Allison P, MD    Allergies Bactrim [sulfamethoxazole-trimethoprim]; Butorphanol tartrate; Food; Ibuprofen; Ketorolac; Lorazepam; Naproxen; Prochlorperazine; and Nsaids  History reviewed. No pertinent family history.  Social  History Social History   Tobacco Use  . Smoking status: Current Every Day Smoker    Packs/day: 0.50    Types: Cigarettes  . Smokeless tobacco: Never Used  Substance Use Topics  . Alcohol use: No  . Drug use: No    Review of Systems Constitutional: Negative for fever. Respiratory: Negative for shortness of breath or cough. Gastrointestinal: Positive for  abdominal pain; negative for nausea , negative for vomiting. Genitourinary: Negative for dysuria , negative for vaginal discharge. Musculoskeletal: Negative for back pain. Skin: Negative for acute skin changes/rash/lesion. ____________________________________________   PHYSICAL EXAM:  VITAL SIGNS: ED Triage Vitals  Enc Vitals Group     BP 02/03/19 1812 116/65     Pulse Rate 02/03/19 1812 (!) 102     Resp 02/03/19 1812 16     Temp 02/03/19 1812 98.9 F (37.2 C)     Temp Source 02/03/19 1812 Oral     SpO2 02/03/19 1812 100 %     Weight 02/03/19 1813 185 lb (83.9 kg)     Height 02/03/19 1813 5\' 5"  (1.651 m)     Head Circumference --      Peak Flow --      Pain Score 02/03/19 1813 10     Pain Loc --      Pain Edu? --      Excl. in [place]? --     Constitutional: Alert and oriented. Well appearing and in no acute distress. Eyes: Conjunctivae are normal. Head: Atraumatic. Nose: No congestion/rhinnorhea. Mouth/Throat: Mucous membranes are moist. Respiratory: Normal respiratory effort.  No retractions. Gastrointestinal: Bowel sounds active x 4; Abdomen is soft without rebound or guarding. Genitourinary: Pelvic exam: Cervix is normal.  No cervical motion tenderness elicited.  Small amount of dark red blood noted in the vaginal vault.  No cervical discharge.  Right adnexal tenderness on bimanual. Musculoskeletal: No extremity tenderness nor edema.  Neurologic:  Normal speech and language. No gross focal neurologic deficits are appreciated. Speech is normal. No gait instability. Skin:  Skin is warm, dry and intact. No rash noted on exposed skin. Psychiatric: Mood and affect are normal. Speech and behavior are normal.  ____________________________________________   LABS (all labs ordered are listed, but only abnormal results are displayed)  Labs Reviewed  URINALYSIS, COMPLETE (UACMP) WITH MICROSCOPIC - Abnormal; Notable for the following components:      Result Value   Color, Urine  YELLOW (*)    APPearance HAZY (*)    Hgb urine dipstick LARGE (*)    RBC / HPF >50 (*)    All other components within normal limits  WET PREP, GENITAL  CHLAMYDIA/NGC RT PCR ([facility] ONLY)  LIPASE, BLOOD  COMPREHENSIVE METABOLIC PANEL  CBC   ____________________________________________  RADIOLOGY  Normal pelvic ultrasound.  No acute abnormality identified. ____________________________________________  Procedures  ____________________________________________  48 year old female presenting to the emergency department for treatment and evaluation of vaginal bleeding.  She states that her menstrual cycle is not due for another 2 weeks.  She is also having some right lower abdominal pain that is similar to previous ovarian cyst.  Bleeding has not been excessively heavy, but she states there has been several clots.  Pelvic exam completed and is overall reassuring.  I doubt the presence of chlamydia or gonorrhea.  It is possible that she may have a bacterial vaginosis.  Because of her lower abdominal pain with history of right ovarian cyst, ultrasound will be repeated.  Patient care relinquished to Dr. Joni Fears who  will follow her up on the ultrasound and decide on disposition.  INITIAL IMPRESSION / ASSESSMENT AND PLAN / ED COURSE  Pertinent labs & imaging results that were available during my care of the patient were reviewed by me and considered in my medical decision making (see chart for details).  ____________________________________________   FINAL CLINICAL IMPRESSION(S) / ED DIAGNOSES  Final diagnoses:  None    Note:  This document was prepared using Dragon voice recognition software and may include unintentional dictation errors.   Chinita Pesterriplett, Devontre Siedschlag B, FNP 02/08/19 1609    Sharman CheekStafford, Phillip, MD 02/08/19 [phone]

## 2019-02-03 NOTE — ED Notes (Signed)
Spoke with pt briefing informing her that her [place] results were back and she would be discharged shortly. Pt requesting to speak with np - she left but dr Joni Fears took over. Informed pt that I would let the dr know to speak with her.

## 2019-02-03 NOTE — ED Notes (Signed)
Pt not in room for discharge. She did speak with dr. Joni Fears who informed her of her ultrasound being clear with no abscess as the pt had thought. Was unable to get updated vital signs d/t pt leaving.

## 2019-02-03 NOTE — ED Notes (Signed)
Pt walked out to the lobby - states she needs to make a phone call and has no reception in the room. Pt ambulated with steady gait. Room closed as the pt left her sandals and wallet in the room.

## 2019-02-03 NOTE — ED Triage Notes (Signed)
Patient reports having vaginal bleeding right now. Her period is due in two weeks. C/o abdominal pain that feels like cramps. HX tubal ligation

## 2019-02-03 NOTE — ED Provider Notes (Signed)
-----------------------------------------   10:41 PM on 02/03/2019 -----------------------------------------   GC chlamydia negative.  Wet prep negative.  Pelvic ultrasound normal.  Patient reassessed, abdomen is soft and nontender.  Patient indicates the pain is worse in the right inguinal ligament area which is nontender without lymphadenopathy.  Unclear the source of her pain, I will refer her to primary care and gynecology.  Recommended continued NSAIDs and heat therapy for now.  Medical screening examination/treatment/procedure(s) were conducted as a shared visit with non-physician practitioner(s) and myself.  I personally evaluated the patient during the encounter.     Final diagnoses:  Pelvic pain in female      Carrie Mew, MD 02/03/19 [id number]

## 2019-02-03 NOTE — Discharge Instructions (Addendum)
Your labs and pelvic ultrasound were okay today. Please follow up with gynecology for further evaluation of your symptoms.

## 2019-02-14 DIAGNOSIS — M545 Low back pain: Secondary | ICD-10-CM | POA: Diagnosis not present

## 2019-02-14 DIAGNOSIS — L732 Hidradenitis suppurativa: Secondary | ICD-10-CM | POA: Diagnosis not present

## 2019-03-15 ENCOUNTER — Other Ambulatory Visit: Payer: Self-pay

## 2019-03-15 ENCOUNTER — Encounter: Payer: Self-pay | Admitting: *Deleted

## 2019-03-15 DIAGNOSIS — E119 Type 2 diabetes mellitus without complications: Secondary | ICD-10-CM | POA: Diagnosis not present

## 2019-03-15 DIAGNOSIS — N9489 Other specified conditions associated with female genital organs and menstrual cycle: Secondary | ICD-10-CM | POA: Diagnosis not present

## 2019-03-15 DIAGNOSIS — R911 Solitary pulmonary nodule: Secondary | ICD-10-CM | POA: Insufficient documentation

## 2019-03-15 DIAGNOSIS — K5900 Constipation, unspecified: Secondary | ICD-10-CM | POA: Insufficient documentation

## 2019-03-15 DIAGNOSIS — K529 Noninfective gastroenteritis and colitis, unspecified: Secondary | ICD-10-CM | POA: Diagnosis not present

## 2019-03-15 DIAGNOSIS — F17210 Nicotine dependence, cigarettes, uncomplicated: Secondary | ICD-10-CM | POA: Insufficient documentation

## 2019-03-15 DIAGNOSIS — R102 Pelvic and perineal pain: Secondary | ICD-10-CM | POA: Diagnosis not present

## 2019-03-15 DIAGNOSIS — R1030 Lower abdominal pain, unspecified: Secondary | ICD-10-CM | POA: Diagnosis present

## 2019-03-15 DIAGNOSIS — R1110 Vomiting, unspecified: Secondary | ICD-10-CM | POA: Diagnosis not present

## 2019-03-15 LAB — URINALYSIS, COMPLETE (UACMP) WITH MICROSCOPIC
Bilirubin Urine: NEGATIVE
Glucose, UA: NEGATIVE mg/dL
Hgb urine dipstick: NEGATIVE
Ketones, ur: NEGATIVE mg/dL
Leukocytes,Ua: NEGATIVE
Nitrite: NEGATIVE
Protein, ur: NEGATIVE mg/dL
Specific Gravity, Urine: 1.034 — ABNORMAL HIGH (ref 1.005–1.030)
pH: 6 (ref 5.0–8.0)

## 2019-03-15 LAB — COMPREHENSIVE METABOLIC PANEL
ALT: 14 U/L (ref 0–44)
AST: 17 U/L (ref 15–41)
Albumin: 4 g/dL (ref 3.5–5.0)
Alkaline Phosphatase: 72 U/L (ref 38–126)
Anion gap: 7 (ref 5–15)
BUN: 19 mg/dL (ref 6–20)
CO2: 26 mmol/L (ref 22–32)
Calcium: 9.4 mg/dL (ref 8.9–10.3)
Chloride: 106 mmol/L (ref 98–111)
Creatinine, Ser: 0.82 mg/dL (ref 0.44–1.00)
GFR calc Af Amer: >60 mL/min (ref >60)
GFR calc non Af Amer: >60 mL/min (ref >60)
Glucose, Bld: 100 mg/dL — ABNORMAL HIGH (ref 70–99)
Potassium: 3.4 mmol/L — ABNORMAL LOW (ref 3.5–5.1)
Sodium: 139 mmol/L (ref 135–145)
Total Bilirubin: 0.5 mg/dL (ref 0.3–1.2)
Total Protein: 7.6 g/dL (ref 6.5–8.1)

## 2019-03-15 LAB — CBC
HCT: 37.6 % (ref 36.0–46.0)
Hemoglobin: 12.3 g/dL (ref 12.0–15.0)
MCH: 27.8 pg (ref 26.0–34.0)
MCHC: 32.7 g/dL (ref 30.0–36.0)
MCV: 85.1 fL (ref 80.0–100.0)
Platelets: 300 10*3/uL (ref 150–400)
RBC: 4.42 MIL/uL (ref 3.87–5.11)
RDW: 14.6 % (ref 11.5–15.5)
WBC: 8.9 10*3/uL (ref 4.0–10.5)
nRBC: 0 % (ref 0.0–0.2)

## 2019-03-15 LAB — POCT PREGNANCY, URINE: Preg Test, Ur: NEGATIVE

## 2019-03-15 LAB — LIPASE, BLOOD: Lipase: 23 U/L (ref 11–51)

## 2019-03-15 MED ORDER — ACETAMINOPHEN 500 MG PO TABS
1000.0000 mg | ORAL_TABLET | Freq: Once | ORAL | Status: AC
  Administered 2019-03-15: 1000 mg via ORAL
  Filled 2019-03-15: qty 2

## 2019-03-15 MED ORDER — SODIUM CHLORIDE 0.9% FLUSH: 3.0000 mL | Freq: Once | INTRAVENOUS | Status: DC

## 2019-03-15 NOTE — ED Triage Notes (Signed)
Has low abd pain for 3 days  No n/v/d  No vag bleeding.  Pt here recently with similar sx.  Pt states no better.  Pt alert

## 2019-03-15 NOTE — ED Notes (Signed)
poct pregnancy Negative 

## 2019-03-16 ENCOUNTER — Emergency Department: Payer: Medicaid Other

## 2019-03-16 ENCOUNTER — Emergency Department
Admission: EM | Admit: 2019-03-16 | Discharge: 2019-03-16 | Disposition: A | Discharge status: Home / Self Care | Payer: Medicaid Other | Attending: Emergency Medicine | Admitting: Emergency Medicine

## 2019-03-16 DIAGNOSIS — R102 Pelvic and perineal pain: Secondary | ICD-10-CM

## 2019-03-16 DIAGNOSIS — R911 Solitary pulmonary nodule: Secondary | ICD-10-CM

## 2019-03-16 DIAGNOSIS — K5900 Constipation, unspecified: Secondary | ICD-10-CM

## 2019-03-16 DIAGNOSIS — R1030 Lower abdominal pain, unspecified: Secondary | ICD-10-CM

## 2019-03-16 DIAGNOSIS — R1110 Vomiting, unspecified: Secondary | ICD-10-CM | POA: Diagnosis not present

## 2019-03-16 DIAGNOSIS — K529 Noninfective gastroenteritis and colitis, unspecified: Secondary | ICD-10-CM

## 2019-03-16 DIAGNOSIS — N9489 Other specified conditions associated with female genital organs and menstrual cycle: Secondary | ICD-10-CM

## 2019-03-16 MED ORDER — ONDANSETRON 4 MG PO TBDP: 4.0000 mg | ORAL_TABLET | Freq: Three times a day (TID) | ORAL | 0 refills | Status: DC | PRN

## 2019-03-16 MED ORDER — ONDANSETRON HCL 4 MG/2ML IJ SOLN: 4.0000 mg | Freq: Once | INTRAMUSCULAR | Status: DC

## 2019-03-16 MED ORDER — IOHEXOL 240 MG/ML SOLN
50.0000 mL | Freq: Once | INTRAMUSCULAR | Status: AC | PRN
  Administered 2019-03-16: 50 mL via ORAL

## 2019-03-16 MED ORDER — OXYCODONE-ACETAMINOPHEN 5-325 MG PO TABS
1.0000 | ORAL_TABLET | Freq: Once | ORAL | Status: AC
  Administered 2019-03-16: 1 via ORAL
  Filled 2019-03-16: qty 1

## 2019-03-16 MED ORDER — ONDANSETRON HCL 4 MG/2ML IJ SOLN
4.0000 mg | Freq: Once | INTRAMUSCULAR | Status: AC
  Administered 2019-03-16: 01:00:00 4 mg via INTRAVENOUS
  Filled 2019-03-16: qty 2

## 2019-03-16 MED ORDER — ONDANSETRON HCL 4 MG/2ML IJ SOLN
INTRAMUSCULAR | Status: AC
  Filled 2019-03-16: qty 2

## 2019-03-16 MED ORDER — IOHEXOL 300 MG/ML  SOLN
100.0000 mL | Freq: Once | INTRAMUSCULAR | Status: AC | PRN
  Administered 2019-03-16: 100 mL via INTRAVENOUS

## 2019-03-16 MED ORDER — HYDROMORPHONE HCL 1 MG/ML IJ SOLN
0.5000 mg | Freq: Once | INTRAMUSCULAR | Status: AC
  Administered 2019-03-16: 03:00:00 0.5 mg via INTRAVENOUS
  Filled 2019-03-16: qty 1

## 2019-03-16 MED ORDER — LACTULOSE 10 GM/15ML PO SOLN: 20.0000 g | Freq: Every day | ORAL | 0 refills | Status: DC | PRN

## 2019-03-16 MED ORDER — OXYCODONE-ACETAMINOPHEN 5-325 MG PO TABS: 1.0000 | ORAL_TABLET | ORAL | 0 refills | Status: DC | PRN

## 2019-03-16 MED ORDER — FENTANYL CITRATE (PF) 100 MCG/2ML IJ SOLN
50.0000 ug | Freq: Once | INTRAMUSCULAR | Status: AC
  Administered 2019-03-16: 01:00:00 50 ug via INTRAVENOUS
  Filled 2019-03-16: qty 2

## 2019-03-16 MED ORDER — CIPROFLOXACIN HCL 500 MG PO TABS: 500.0000 mg | ORAL_TABLET | Freq: Two times a day (BID) | ORAL | 0 refills | Status: DC

## 2019-03-16 MED ORDER — SODIUM CHLORIDE 0.9 % IV BOLUS
1000.0000 mL | Freq: Once | INTRAVENOUS | Status: AC
  Administered 2019-03-16: 1000 mL via INTRAVENOUS

## 2019-03-16 MED ORDER — DIPHENHYDRAMINE HCL 50 MG/ML IJ SOLN
25.0000 mg | Freq: Once | INTRAMUSCULAR | Status: AC
  Administered 2019-03-16: 25 mg via INTRAVENOUS
  Filled 2019-03-16: qty 1

## 2019-03-16 NOTE — ED Notes (Signed)
Pt retching, requesting crackers for nausea. MD states no food until scan resulted. Md offered zofran. PT did not want.

## 2019-03-16 NOTE — ED Notes (Signed)
MD made aware of pt's increasing pain

## 2019-03-16 NOTE — ED Notes (Signed)
Patient transported to CT 

## 2019-03-16 NOTE — ED Notes (Signed)
Patient to stat desk asking about wait time. Patient given update on wait time. Patient verbalized understand.

## 2019-03-16 NOTE — ED Notes (Signed)
PT becoming agitated, asking hadn't this RN talked to the md and wondering why MD had not yet been over. Explained to pt that MD had been informed of pain and would be over as soon as she was finished with her current task.

## 2019-03-16 NOTE — ED Provider Notes (Signed)
[facility] Emergency Department Provider Note   ____________________________________________   First MD Initiated Contact with Patient 03/16/19 [id number]     (approximate)  I have reviewed the triage vital signs and the nursing notes.   HISTORY  Chief Complaint Abdominal Pain    HPI Karina BectonRobbin Anderson is a 48 y.o. female who presents to the ED from home with a chief complaint of abdominal pain.  Patient complains of low abdominal pain in a band radiating to her back for the past 3 days.  Describes pain as constant and sharp.  Denies fever, cough, chest pain, shortness of breath, nausea, vomiting, dysuria, diarrhea.  Denies vaginal bleeding, discharge or exposure to STDs.  Denies recent travel, trauma or exposure to persons diagnosed with coronavirus.       Past Medical History:  Diagnosis Date   Diabetes mellitus without complication (HCC)    Hydradenitis    Seizures (HCC)     Patient Active Problem List   Diagnosis Date Noted   Tobacco use disorder 03/13/2016   Cannabis use disorder, moderate, dependence (HCC) 03/13/2016   Bipolar affective disorder, manic, severe, with psychotic behavior (HCC) 03/12/2016   Seizures (HCC) 03/11/2016   Involuntary commitment 03/11/2016    No past surgical history on file.  Prior to Admission medications   Medication Sig Start Date End Date Taking? Authorizing Provider  ciprofloxacin (CIPRO) 500 MG tablet Take 1 tablet (500 mg total) by mouth 2 (two) times daily. 03/16/19   Irean HongSung, Matthias Bogus J, MD  cyclobenzaprine (FLEXERIL) 5 MG tablet Take 1-2 tablets 3 times daily as needed 09/28/18   Enid DerryWagner, Ashley, PA-C  dicyclomine (BENTYL) 20 MG tablet Take 1 tablet (20 mg total) by mouth 3 (three) times daily as needed for spasms. 09/27/17 09/27/18  Myrna BlazerSchaevitz, David Matthew, MD  doxylamine, Sleep, (UNISOM) 25 MG tablet Take 50 mg by mouth at bedtime as needed.    [provider]  famotidine (PEPCID) 40 MG tablet Take 1  tablet (40 mg total) by mouth every evening. 09/27/17 09/27/18  Myrna BlazerSchaevitz, David Matthew, MD  famotidine (PEPCID) 40 MG tablet Take 1 tablet (40 mg total) by mouth every evening. 12/25/17 12/25/18  Rebecka ApleyWebster, Allison P, MD  hydrOXYzine (ATARAX/VISTARIL) 50 MG tablet Take 1 tablet (50 mg total) by mouth every 6 (six) hours as needed for anxiety. Patient not taking: Reported on 07/23/2017 03/29/16   Pucilowska, Ellin GoodieJolanta B, MD  lactulose (CHRONULAC) 10 GM/15ML solution Take 30 mLs (20 g total) by mouth daily as needed for mild constipation. 03/16/19   Irean HongSung, Ludell Zacarias J, MD  lidocaine (LIDODERM) 5 % Place 1 patch onto the skin daily. Remove & Discard patch within 12 hours or as directed by MD 09/28/18   Enid DerryWagner, Ashley, PA-C  methocarbamol (ROBAXIN) 500 MG tablet Take 1 tablet (500 mg total) by mouth 4 (four) times daily. 11/18/17   Cuthriell, Delorise RoyalsJonathan D, PA-C  metoCLOPramide (REGLAN) 10 MG tablet Take 1 tablet (10 mg total) by mouth every 8 (eight) hours as needed. 12/25/17   Rebecka ApleyWebster, Allison P, MD  ondansetron (ZOFRAN ODT) 4 MG disintegrating tablet Take 1 tablet (4 mg total) by mouth every 8 (eight) hours as needed for nausea or vomiting. 03/16/19   Irean HongSung, Cloteal Isaacson J, MD  oxyCODONE-acetaminophen (PERCOCET/ROXICET) 5-325 MG tablet Take 1 tablet by mouth every 4 (four) hours as needed for severe pain. 03/16/19   Irean HongSung, Alexsys Eskin J, MD  phenazopyridine (PYRIDIUM) 200 MG tablet Take 1 tablet (200 mg total) by mouth 3 (three) times daily  as needed for pain. Patient not taking: Reported on 07/23/2017 05/19/16   Menshew, Dannielle Karvonen, PA-C  phenytoin (DILANTIN) 100 MG ER capsule Take 1 capsule (100 mg total) by mouth 3 (three) times daily. Patient not taking: Reported on 07/23/2017 03/29/16   Clovis Fredrickson, MD  predniSONE (DELTASONE) 50 MG tablet Take 1 tab per day 09/29/18   Laban Emperor, PA-C  QUEtiapine (SEROQUEL) 400 MG tablet Take 2 tablets (800 mg total) by mouth at bedtime. Patient not taking: Reported on 07/23/2017 03/29/16    Pucilowska, Herma Ard B, MD  sucralfate (CARAFATE) 1 g tablet Take 1 tablet (1 g total) by mouth 2 (two) times daily. 12/25/17   Loney Hering, MD    Allergies Bactrim [sulfamethoxazole-trimethoprim], Butorphanol tartrate, Food, Ibuprofen, Ketorolac, Lorazepam, Naproxen, Prochlorperazine, and Nsaids  No family history on file.  Social History Social History   Tobacco Use   Smoking status: Current Every Day Smoker    Packs/day: 0.50    Types: Cigarettes   Smokeless tobacco: Never Used  Substance Use Topics   Alcohol use: No   Drug use: No    Review of Systems  Constitutional: No fever/chills Eyes: No visual changes. ENT: No sore throat. Cardiovascular: Denies chest pain. Respiratory: Denies shortness of breath. Gastrointestinal: Positive for lower abdominal pain.  No nausea, no vomiting.  No diarrhea.  No constipation. Genitourinary: Negative for dysuria. Musculoskeletal: Negative for back pain. Skin: Negative for rash. Neurological: Negative for headaches, focal weakness or numbness.   ____________________________________________   PHYSICAL EXAM:  VITAL SIGNS: ED Triage Vitals  Enc Vitals Group     BP 03/15/19 2013 117/84     Pulse Rate 03/15/19 2013 82     Resp 03/15/19 2013 20     Temp 03/15/19 2013 99.1 F (37.3 C)     Temp src --      SpO2 03/15/19 2013 99 %     Weight --      Height --      Head Circumference --      Peak Flow --      Pain Score 03/15/19 2011 10     Pain Loc --      Pain Edu? --      Excl. in [place]? --     Constitutional: Alert and oriented. Well appearing and in mild acute distress. Eyes: Conjunctivae are normal. PERRL. EOMI. Head: Atraumatic. Nose: No congestion/rhinnorhea. Mouth/Throat: Mucous membranes are moist.  Oropharynx non-erythematous. Neck: No stridor.   Cardiovascular: Normal rate, regular rhythm. Grossly normal heart sounds.  Good peripheral circulation. Respiratory: Normal respiratory effort.  No retractions.  Lungs CTAB. Gastrointestinal: Soft and mildly tender to palpation lower abdomen without rebound or guarding. No distention. No abdominal bruits. No CVA tenderness. Musculoskeletal: No lower extremity tenderness nor edema.  No joint effusions. Neurologic:  Normal speech and language. No gross focal neurologic deficits are appreciated. No gait instability. Skin:  Skin is warm, dry and intact. No rash noted. Psychiatric: Mood and affect are angry. Speech and behavior are normal.  ____________________________________________   LABS (all labs ordered are listed, but only abnormal results are displayed)  Labs Reviewed  COMPREHENSIVE METABOLIC PANEL - Abnormal; Notable for the following components:      Result Value   Potassium 3.4 (*)    Glucose, Bld 100 (*)    All other components within normal limits  URINALYSIS, COMPLETE (UACMP) WITH MICROSCOPIC - Abnormal; Notable for the following components:   Color, Urine YELLOW (*)  APPearance HAZY (*)    Specific Gravity, Urine 1.034 (*)    Bacteria, UA RARE (*)    All other components within normal limits  LIPASE, BLOOD  CBC  POC URINE PREG, ED  POCT PREGNANCY, URINE   ____________________________________________  EKG  None ____________________________________________  RADIOLOGY  ED MD interpretation: Incidental pulmonary nodule, enteritis, constipation, pelvic congestion, migrating left adnexal clip  Official radiology report(s): Ct Abdomen Pelvis W Contrast  Result Date: 03/16/2019 CLINICAL DATA:  Lower abdominal pain. EXAM: CT ABDOMEN AND PELVIS WITH CONTRAST TECHNIQUE: Multidetector CT imaging of the abdomen and pelvis was performed using the standard protocol following bolus administration of intravenous contrast. Contrast bolus timing is suboptimal due to patient vomiting after administration of IV contrast. CONTRAST:  100mL OMNIPAQUE IOHEXOL 300 MG/ML  SOLN COMPARISON:  CT 07/23/2017 FINDINGS: Lower chest: 5 mm subpleural  nodule in the right middle lobe, image 1 series 11. Lung bases are otherwise clear. Hepatobiliary: No focal liver abnormality is seen. No gallstones, gallbladder wall thickening, or biliary dilatation. Pancreas: No ductal dilatation or inflammation. Spleen: Normal in size without focal abnormality. Adrenals/Urinary Tract: No adrenal nodule. No hydronephrosis or perinephric edema. Homogeneous renal enhancement with symmetric excretion on delayed phase imaging. Urinary bladder is physiologically distended without wall thickening. Stomach/Bowel: Stomach distended with ingested material. No gastric wall thickening. No bowel obstruction. Mild small bowel wall thickening involving bowel loops in the left abdomen with adjacent mesenteric edema, for example image 8 series 6. No pneumatosis. Appendix tentatively visualized and normal, image 42 series 2. No appendicitis. Moderate stool in the proximal colon. Small volume of stool distally. No colonic wall thickening or inflammatory change. Vascular/Lymphatic: Abdominal aorta is normal in caliber. Dilated left ovarian vein at 7 mm, similar to prior exam. Right ovarian vein is also prominent at 6 mm. No adenopathy. Reproductive: Tubal ligation clips seen in the right adnexa. A tubal ligation clip previously seen in the left adnexa is now dependent the pelvis just to the right of midline. No adnexal mass. Other: No free air or free fluid. No intra-abdominal fluid collection. Musculoskeletal: Mild degenerative change about the right sacroiliac joint. Incidental osteitis pubis. Hemi transitional lumbosacral anatomy. There are no acute or suspicious osseous abnormalities. IMPRESSION: 1. Mild small bowel wall thickening involving bowel loops in the left abdomen with adjacent mesenteric edema, suspicious for enteritis. No bowel obstruction. 2. Prominent bilateral ovarian veins, can be seen with pelvic congestion syndrome in the appropriate clinical setting. 3. Small 5 mm right middle  lobe pulmonary nodule is likely benign, no additional follow-up is needed if patient is considered low risk. Recommend follow-up CT in 12 months, with no further follow-up if unchanged in 12 months. 4. History of tubal ligation, however the left tubal ligation clip is no longer appropriately position in the adnexa and is now seen in the dependent pelvis. Right tubal ligation clip is unchanged normally positioned. Electronically Signed   By: Narda RutherfordMelanie  Sanford M.D.   On: 03/16/2019 02:49    ____________________________________________   PROCEDURES  Procedure(s) performed (including Critical Care):  Procedures   ____________________________________________   INITIAL IMPRESSION / ASSESSMENT AND PLAN / ED COURSE  As part of my medical decision making, I reviewed the following data within the electronic MEDICAL RECORD NUMBER Nursing notes reviewed and incorporated, Labs reviewed, Old chart reviewed, Radiograph reviewed and Notes from prior ED visits     Karina Anderson was evaluated in Emergency Department on 03/16/2019 for the symptoms described in the history of present illness. She  was evaluated in the context of the global COVID-19 pandemic, which necessitated consideration that the patient might be at risk for infection with the SARS-CoV-2 virus that causes COVID-19. Institutional protocols and algorithms that pertain to the evaluation of patients at risk for COVID-19 are in a state of rapid change based on information released by regulatory bodies including the CDC and federal and state organizations. These policies and algorithms were followed during the patient's care in the ED.   48 year old female who presents with lower abdominal pain x3 days. Differential diagnosis includes, but is not limited to, ovarian cyst, ovarian torsion, acute appendicitis, diverticulitis, urinary tract infection/pyelonephritis, endometriosis, bowel obstruction, colitis, renal colic, gastroenteritis, hernia, fibroids,  endometriosis, pregnancy related pain including ectopic pregnancy, etc.  Patient was seen in the ED on 6/7 for pelvic pain and vaginal bleeding.  Had wet prep/DNA/pelvic ultrasound at that time which were unremarkable.  Laboratory and urinalysis results from this visit unremarkable.  Will proceed with CT abdomen/pelvis to evaluate etiology of patient's pain.  Administer IV fentanyl and Zofran for pain.  Clinical Course as of Mar 15 342  Sat Mar 16, 2019  0320 Patient feeling better.  Updated her on all CT findings.  Will discharge home on Cipro, Percocet, Zofran and lactulose.  Patient states she has a GYN appointment next month.  Will provide name of local referral as well.  We will also refer her to pulmonary nodule clinic.  Strict return precautions given.  Patient verbalizes understanding agrees with plan of care.   [JS]    Clinical Course User Index [JS] Irean Hong, MD     ____________________________________________   FINAL CLINICAL IMPRESSION(S) / ED DIAGNOSES  Final diagnoses:  Pelvic pain in female  Lower abdominal pain  Enteritis  Pelvic congestion syndrome  Pulmonary nodule  Constipation, unspecified constipation type     ED Discharge Orders         Ordered    oxyCODONE-acetaminophen (PERCOCET/ROXICET) 5-325 MG tablet  Every 4 hours PRN     03/16/19 0331    ondansetron (ZOFRAN ODT) 4 MG disintegrating tablet  Every 8 hours PRN     03/16/19 0331    ciprofloxacin (CIPRO) 500 MG tablet  2 times daily     03/16/19 0331    lactulose (CHRONULAC) 10 GM/15ML solution  Daily PRN     03/16/19 0331           Note:  This document was prepared using Dragon voice recognition software and may include unintentional dictation errors.   Irean Hong, MD 03/16/19 [phone]

## 2019-03-16 NOTE — Discharge Instructions (Addendum)
1.  You may take medicines as needed for pain and nausea (Percocet/Zofran). 2.  Take antibiotic as prescribed (Cipro 500 mg twice daily x5 days). 3. Take Lactulose as needed for bowel movements. 4.  Return to the ER for worsening symptoms, persistent vomiting, difficulty breathing or other concerns.

## 2019-03-16 NOTE — ED Notes (Signed)
Patient given telephone so she can make arrangements for a ride home.  Patient instructed to have person who is providing a ride to come into waiting room and let them know she is here and then I will take her out, verbalized understanding.

## 2019-04-13 DIAGNOSIS — R1031 Right lower quadrant pain: Secondary | ICD-10-CM | POA: Diagnosis not present

## 2019-04-13 DIAGNOSIS — R079 Chest pain, unspecified: Secondary | ICD-10-CM | POA: Diagnosis not present

## 2019-04-13 DIAGNOSIS — R102 Pelvic and perineal pain: Secondary | ICD-10-CM | POA: Diagnosis not present

## 2019-04-13 DIAGNOSIS — R0602 Shortness of breath: Secondary | ICD-10-CM | POA: Diagnosis not present

## 2019-05-17 DIAGNOSIS — R1031 Right lower quadrant pain: Secondary | ICD-10-CM | POA: Diagnosis not present

## 2019-05-17 DIAGNOSIS — I10 Essential (primary) hypertension: Secondary | ICD-10-CM | POA: Diagnosis not present

## 2019-05-17 DIAGNOSIS — R102 Pelvic and perineal pain: Secondary | ICD-10-CM | POA: Diagnosis not present

## 2019-05-17 DIAGNOSIS — R52 Pain, unspecified: Secondary | ICD-10-CM | POA: Diagnosis not present

## 2019-05-17 DIAGNOSIS — R1084 Generalized abdominal pain: Secondary | ICD-10-CM | POA: Diagnosis not present

## 2019-05-17 DIAGNOSIS — R110 Nausea: Secondary | ICD-10-CM | POA: Diagnosis not present

## 2019-06-13 ENCOUNTER — Telehealth: Payer: Self-pay | Admitting: *Deleted

## 2019-06-13 NOTE — Telephone Encounter (Signed)
Incidental lung nodule found on recent imaging. Recent imaging reports and referral request for pt to be scheduled in the lung nodule clinic for further workup faxed to primary provider. Awaiting response.  

## 2019-06-29 ENCOUNTER — Emergency Department
Admission: EM | Admit: 2019-06-29 | Discharge: 2019-06-30 | Disposition: A | Discharge status: Home / Self Care | Payer: Medicaid Other | Attending: Emergency Medicine | Admitting: Emergency Medicine

## 2019-06-29 ENCOUNTER — Emergency Department: Payer: Medicaid Other

## 2019-06-29 ENCOUNTER — Other Ambulatory Visit: Payer: Self-pay

## 2019-06-29 ENCOUNTER — Encounter: Payer: Self-pay | Admitting: Emergency Medicine

## 2019-06-29 DIAGNOSIS — Z79899 Other long term (current) drug therapy: Secondary | ICD-10-CM | POA: Diagnosis not present

## 2019-06-29 DIAGNOSIS — R1031 Right lower quadrant pain: Secondary | ICD-10-CM

## 2019-06-29 DIAGNOSIS — E119 Type 2 diabetes mellitus without complications: Secondary | ICD-10-CM | POA: Insufficient documentation

## 2019-06-29 DIAGNOSIS — N730 Acute parametritis and pelvic cellulitis: Secondary | ICD-10-CM | POA: Insufficient documentation

## 2019-06-29 DIAGNOSIS — R109 Unspecified abdominal pain: Secondary | ICD-10-CM | POA: Diagnosis not present

## 2019-06-29 DIAGNOSIS — F17210 Nicotine dependence, cigarettes, uncomplicated: Secondary | ICD-10-CM | POA: Insufficient documentation

## 2019-06-29 DIAGNOSIS — R102 Pelvic and perineal pain: Secondary | ICD-10-CM | POA: Diagnosis present

## 2019-06-29 LAB — URINALYSIS, COMPLETE (UACMP) WITH MICROSCOPIC
Bacteria, UA: NONE SEEN
Bilirubin Urine: NEGATIVE
Glucose, UA: NEGATIVE mg/dL
Hgb urine dipstick: NEGATIVE
Ketones, ur: NEGATIVE mg/dL
Leukocytes,Ua: NEGATIVE
Nitrite: NEGATIVE
Protein, ur: NEGATIVE mg/dL
Specific Gravity, Urine: 1.024 (ref 1.005–1.030)
pH: 5 (ref 5.0–8.0)

## 2019-06-29 LAB — COMPREHENSIVE METABOLIC PANEL
ALT: 12 U/L (ref 0–44)
AST: 17 U/L (ref 15–41)
Albumin: 4.5 g/dL (ref 3.5–5.0)
Alkaline Phosphatase: 73 U/L (ref 38–126)
Anion gap: 8 (ref 5–15)
BUN: 19 mg/dL (ref 6–20)
CO2: 27 mmol/L (ref 22–32)
Calcium: 9.7 mg/dL (ref 8.9–10.3)
Chloride: 105 mmol/L (ref 98–111)
Creatinine, Ser: 0.74 mg/dL (ref 0.44–1.00)
GFR calc Af Amer: >60 mL/min (ref >60)
GFR calc non Af Amer: >60 mL/min (ref >60)
Glucose, Bld: 100 mg/dL — ABNORMAL HIGH (ref 70–99)
Potassium: 3.3 mmol/L — ABNORMAL LOW (ref 3.5–5.1)
Sodium: 140 mmol/L (ref 135–145)
Total Bilirubin: 0.4 mg/dL (ref 0.3–1.2)
Total Protein: 8.2 g/dL — ABNORMAL HIGH (ref 6.5–8.1)

## 2019-06-29 LAB — CBC
HCT: 37.3 % (ref 36.0–46.0)
Hemoglobin: 11.9 g/dL — ABNORMAL LOW (ref 12.0–15.0)
MCH: 27.3 pg (ref 26.0–34.0)
MCHC: 31.9 g/dL (ref 30.0–36.0)
MCV: 85.6 fL (ref 80.0–100.0)
Platelets: 340 10*3/uL (ref 150–400)
RBC: 4.36 MIL/uL (ref 3.87–5.11)
RDW: 14.8 % (ref 11.5–15.5)
WBC: 6.1 10*3/uL (ref 4.0–10.5)
nRBC: 0 % (ref 0.0–0.2)

## 2019-06-29 LAB — LIPASE, BLOOD: Lipase: 16 U/L (ref 11–51)

## 2019-06-29 LAB — POCT PREGNANCY, URINE: Preg Test, Ur: NEGATIVE

## 2019-06-29 MED ORDER — OXYCODONE-ACETAMINOPHEN 5-325 MG PO TABS
1.0000 | ORAL_TABLET | ORAL | Status: DC | PRN
  Administered 2019-06-29: 1 via ORAL
  Filled 2019-06-29 (×2): qty 1

## 2019-06-29 MED ORDER — SODIUM CHLORIDE 0.9% FLUSH: 3.0000 mL | Freq: Once | INTRAVENOUS | Status: DC

## 2019-06-29 NOTE — ED Notes (Signed)
Pt refused CT, states she already knows what is wrong with her, declined to have IV placed, informed Dr. Jari Pigg, pt agreeable to ultrasound.

## 2019-06-29 NOTE — ED Notes (Signed)
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 Pt reports needs pain control until she can follow up. Denies all other sx's.

## 2019-06-29 NOTE — ED Triage Notes (Signed)
R lower abdominal pain x 3 days.

## 2019-06-29 NOTE — ED Notes (Addendum)
Pt seen carrying in bags of [employer] food at this time.

## 2019-06-29 NOTE — ED Notes (Signed)
Pt ambulatory to ultrasound.

## 2019-06-29 NOTE — ED Notes (Signed)
Called by [place] several times, not seen in lobby at this time.

## 2019-06-30 ENCOUNTER — Emergency Department: Payer: Medicaid Other

## 2019-06-30 DIAGNOSIS — R109 Unspecified abdominal pain: Secondary | ICD-10-CM | POA: Diagnosis not present

## 2019-06-30 LAB — WET PREP, GENITAL
Sperm: NONE SEEN
Trich, Wet Prep: NONE SEEN
Yeast Wet Prep HPF POC: NONE SEEN

## 2019-06-30 MED ORDER — MORPHINE SULFATE (PF) 4 MG/ML IV SOLN
4.0000 mg | Freq: Once | INTRAVENOUS | Status: AC
  Administered 2019-06-30: 4 mg via INTRAVENOUS
  Filled 2019-06-30: qty 1

## 2019-06-30 MED ORDER — TRAMADOL HCL 50 MG PO TABS
50.0000 mg | ORAL_TABLET | Freq: Once | ORAL | Status: AC
  Administered 2019-06-30: 50 mg via ORAL
  Filled 2019-06-30: qty 1

## 2019-06-30 MED ORDER — TRAMADOL HCL 50 MG PO TABS: 50.0000 mg | ORAL_TABLET | Freq: Four times a day (QID) | ORAL | 0 refills | Status: DC | PRN

## 2019-06-30 MED ORDER — CEFTRIAXONE SODIUM 250 MG IJ SOLR
250.0000 mg | Freq: Once | INTRAMUSCULAR | Status: AC
  Administered 2019-06-30: 250 mg via INTRAMUSCULAR
  Filled 2019-06-30: qty 250

## 2019-06-30 MED ORDER — ONDANSETRON HCL 4 MG/2ML IJ SOLN
4.0000 mg | Freq: Once | INTRAMUSCULAR | Status: AC
  Administered 2019-06-30: 4 mg via INTRAVENOUS
  Filled 2019-06-30: qty 2

## 2019-06-30 MED ORDER — DOXYCYCLINE MONOHYDRATE 100 MG PO TABS: 100.0000 mg | ORAL_TABLET | Freq: Two times a day (BID) | ORAL | 0 refills | Status: AC

## 2019-06-30 MED ORDER — AZITHROMYCIN 500 MG PO TABS
1000.0000 mg | ORAL_TABLET | Freq: Once | ORAL | Status: AC
  Administered 2019-06-30: 1000 mg via ORAL
  Filled 2019-06-30: qty 2

## 2019-06-30 NOTE — ED Provider Notes (Signed)
[facility] Emergency Department Provider Note    First MD Initiated Contact with Patient 06/29/19 2346     (approximate)  I have reviewed the triage vital signs and the nursing notes.   HISTORY  Chief Complaint Abdominal Pain    HPI Karina Anderson is a 48 y.o. female with below list of previous medical conditions presents to the emergency department secondary to "pain to my cervix and ovaries for 3 days".  Patient denies any nausea vomiting diarrhea constipation.  Patient denies any urinary symptoms.  Patient denies any vaginal bleeding.  Patient does admit to new sexual partner.  Patient does admit to unprotected sex.       Past Medical History:  Diagnosis Date  . Diabetes mellitus without complication (HCC)   . Hydradenitis   . Seizures [facility](HCC)     Patient Active Problem List   Diagnosis Date Noted  . Tobacco use disorder 03/13/2016  . Cannabis use disorder, moderate, dependence (HCC) 03/13/2016  . Bipolar affective disorder, manic, severe, with psychotic behavior (HCC) 03/12/2016  . Seizures (HCC) 03/11/2016  . Involuntary commitment 03/11/2016    History reviewed. No pertinent surgical history.  Prior to Admission medications   Medication Sig Start Date End Date Taking? Authorizing Provider  ciprofloxacin (CIPRO) 500 MG tablet Take 1 tablet (500 mg total) by mouth 2 (two) times daily. 03/16/19   Irean HongSung, Jade J, MD  cyclobenzaprine (FLEXERIL) 5 MG tablet Take 1-2 tablets 3 times daily as needed 09/28/18   Enid DerryWagner, Ashley, PA-C  dicyclomine (BENTYL) 20 MG tablet Take 1 tablet (20 mg total) by mouth 3 (three) times daily as needed for spasms. 09/27/17 09/27/18  Myrna BlazerSchaevitz, David Matthew, MD  doxycycline (ADOXA) 100 MG tablet Take 1 tablet (100 mg total) by mouth 2 (two) times daily for 14 days. 06/30/19 07/14/19  Darci CurrentBrown, [place] N, MD  doxylamine, Sleep, (UNISOM) 25 MG tablet Take 50 mg by mouth at bedtime as needed.    [provider]   famotidine (PEPCID) 40 MG tablet Take 1 tablet (40 mg total) by mouth every evening. 09/27/17 09/27/18  Myrna BlazerSchaevitz, David Matthew, MD  famotidine (PEPCID) 40 MG tablet Take 1 tablet (40 mg total) by mouth every evening. 12/25/17 12/25/18  Rebecka ApleyWebster, Allison P, MD  hydrOXYzine (ATARAX/VISTARIL) 50 MG tablet Take 1 tablet (50 mg total) by mouth every 6 (six) hours as needed for anxiety. Patient not taking: Reported on 07/23/2017 03/29/16   Pucilowska, Ellin GoodieJolanta B, MD  lactulose (CHRONULAC) 10 GM/15ML solution Take 30 mLs (20 g total) by mouth daily as needed for mild constipation. 03/16/19   Irean HongSung, Jade J, MD  lidocaine (LIDODERM) 5 % Place 1 patch onto the skin daily. Remove & Discard patch within 12 hours or as directed by MD 09/28/18   Enid DerryWagner, Ashley, PA-C  methocarbamol (ROBAXIN) 500 MG tablet Take 1 tablet (500 mg total) by mouth 4 (four) times daily. 11/18/17   Cuthriell, Delorise RoyalsJonathan D, PA-C  metoCLOPramide (REGLAN) 10 MG tablet Take 1 tablet (10 mg total) by mouth every 8 (eight) hours as needed. 12/25/17   Rebecka ApleyWebster, Allison P, MD  ondansetron (ZOFRAN ODT) 4 MG disintegrating tablet Take 1 tablet (4 mg total) by mouth every 8 (eight) hours as needed for nausea or vomiting. 03/16/19   Irean HongSung, Jade J, MD  oxyCODONE-acetaminophen (PERCOCET/ROXICET) 5-325 MG tablet Take 1 tablet by mouth every 4 (four) hours as needed for severe pain. 03/16/19   Irean HongSung, Jade J, MD  phenazopyridine (PYRIDIUM) 200 MG tablet Take 1  tablet (200 mg total) by mouth 3 (three) times daily as needed for pain. Patient not taking: Reported on 07/23/2017 05/19/16   Menshew, Charlesetta Ivory, PA-C  phenytoin (DILANTIN) 100 MG ER capsule Take 1 capsule (100 mg total) by mouth 3 (three) times daily. Patient not taking: Reported on 07/23/2017 03/29/16   Shari Prows, MD  predniSONE (DELTASONE) 50 MG tablet Take 1 tab per day 09/29/18   Enid Derry, PA-C  QUEtiapine (SEROQUEL) 400 MG tablet Take 2 tablets (800 mg total) by mouth at bedtime. Patient not  taking: Reported on 07/23/2017 03/29/16   Pucilowska, Braulio Conte B, MD  sucralfate (CARAFATE) 1 g tablet Take 1 tablet (1 g total) by mouth 2 (two) times daily. 12/25/17   Rebecka Apley, MD  traMADol (ULTRAM) 50 MG tablet Take 1 tablet (50 mg total) by mouth every 6 (six) hours as needed. 06/30/19 06/29/20  Darci Current, MD    Allergies Citrus, Sulfamethoxazole-trimethoprim, Butorphanol, Butorphanol tartrate, Contrast media [iodinated diagnostic agents], Food, Ibuprofen, Ketorolac, Lorazepam, Naproxen, Prochlorperazine, and Nsaids  No family history on file.  Social History Social History   Tobacco Use  . Smoking status: Current Every Day Smoker    Packs/day: 0.50    Types: Cigarettes  . Smokeless tobacco: Never Used  Substance Use Topics  . Alcohol use: No  . Drug use: No    Review of Systems Constitutional: No fever/chills Eyes: No visual changes. ENT: No sore throat. Cardiovascular: Denies chest pain. Respiratory: Denies shortness of breath. Gastrointestinal: No abdominal pain.  No nausea, no vomiting.  No diarrhea.  No constipation. Genitourinary: Negative for dysuria. Musculoskeletal: Negative for neck pain.  Negative for back pain. Integumentary: Negative for rash. Neurological: Negative for headaches, focal weakness or numbness.   ____________________________________________   PHYSICAL EXAM:  VITAL SIGNS: ED Triage Vitals [06/29/19 1822]  Enc Vitals Group     BP 121/74     Pulse Rate 94     Resp 18     Temp 98.7 F (37.1 C)     Temp Source Oral     SpO2 99 %     Weight 83.9 kg (185 lb)     Height 1.651 m ( )     Head Circumference      Peak Flow      Pain Score 10     Pain Loc      Pain Edu?      Excl. in GC?     Constitutional: Alert and oriented.  Eyes: Conjunctivae are normal.  Mouth/Throat: Patient is wearing a mask. Neck: No stridor.  No meningeal signs.   Cardiovascular: Normal rate, regular rhythm. Good peripheral circulation.  Grossly normal heart sounds. Respiratory: Normal respiratory effort.  No retractions. Gastrointestinal: Soft and nontender. No distention.  Genitourinary: No abnormality noted on external genitalia.  Yellowish-white vaginal discharge.  Positive cervical motion tenderness Musculoskeletal: No lower extremity tenderness nor edema. No gross deformities of extremities. Neurologic:  Normal speech and language. No gross focal neurologic deficits are appreciated.  Skin:  Skin is warm, dry and intact. Psychiatric: Mood and affect are normal. Speech and behavior are normal.  ____________________________________________   LABS (all labs ordered are listed, but only abnormal results are displayed)  Labs Reviewed  COMPREHENSIVE METABOLIC PANEL - Abnormal; Notable for the following components:      Result Value   Potassium 3.3 (*)    Glucose, Bld 100 (*)    Total Protein 8.2 (*)    All other  components within normal limits  CBC - Abnormal; Notable for the following components:   Hemoglobin 11.9 (*)    All other components within normal limits  URINALYSIS, COMPLETE (UACMP) WITH MICROSCOPIC - Abnormal; Notable for the following components:   Color, Urine YELLOW (*)    APPearance CLEAR (*)    All other components within normal limits  GC/CHLAMYDIA PROBE AMP  WET PREP, GENITAL  LIPASE, BLOOD  POC URINE PREG, ED  POCT PREGNANCY, URINE    RADIOLOGY I, [place] Nayelie Gionfriddo, personally viewed and evaluated these images (plain radiographs) as part of my medical decision making, as well as reviewing the written report by the radiologist.  ED MD interpretation: Small amount of nonspecific endometrial fluid noted on ultrasound.  Official radiology report(s): Ct Abdomen Pelvis Wo Contrast  Result Date: 06/30/2019 CLINICAL DATA:  Abdominal pain with appendicitis suspected. EXAM: CT ABDOMEN AND PELVIS WITHOUT CONTRAST TECHNIQUE: Multidetector CT imaging of the abdomen and pelvis was performed following the  standard protocol without IV contrast. COMPARISON:  March 16, 2019 FINDINGS: Lower chest: The lung bases are clear. The heart size is normal. Hepatobiliary: The liver is normal. Normal gallbladder.There is no biliary ductal dilation. Pancreas: Normal contours without ductal dilatation. No peripancreatic fluid collection. Spleen: No splenic laceration or hematoma. Adrenals/Urinary Tract: --Adrenal glands: No adrenal hemorrhage. --Right kidney/ureter: No hydronephrosis or perinephric hematoma. --Left kidney/ureter: No hydronephrosis or perinephric hematoma. --Urinary bladder: The bladder is decompressed and therefore poorly evaluated. Stomach/Bowel: --Stomach/Duodenum: No hiatal hernia or other gastric abnormality. Normal duodenal course and caliber. --Small bowel: No dilatation or inflammation. --Colon: No focal abnormality. --Appendix: Normal. Vascular/Lymphatic: Normal course and caliber of the major abdominal vessels. --No retroperitoneal lymphadenopathy. --No mesenteric lymphadenopathy. --No pelvic or inguinal lymphadenopathy. Reproductive: The patient is status post bilateral tubal ligation. The left-sided tubal ligation clip is malpositioned and located in low right hemipelvis adjacent to the rectum. Other: No ascites or free air. The abdominal wall is normal. Musculoskeletal. No acute displaced fractures. IMPRESSION: 1. No acute abnormality. Normal appendix in the right lower quadrant. 2. Persistently malpositioned left tubal ligation clip similar to prior study. Electronically Signed   By: Constance Holster M.D.   On: 06/30/2019 01:15   US Pelvic Complete W Transvaginal And Torsion R/o  Result Date: 06/29/2019 CLINICAL DATA:  RIGHT lower quadrant pain EXAM: TRANSABDOMINAL AND TRANSVAGINAL ULTRASOUND OF PELVIS DOPPLER ULTRASOUND OF OVARIES TECHNIQUE: Both transabdominal and transvaginal ultrasound examinations of the pelvis were performed. Transabdominal technique was performed for global imaging of the  pelvis including uterus, ovaries, adnexal regions, and pelvic cul-de-sac. It was necessary to proceed with endovaginal exam following the transabdominal exam to visualize the uterus, endometrium, and ovaries. Color and duplex Doppler ultrasound was utilized to evaluate blood flow to the ovaries. COMPARISON:  02/03/2019 FINDINGS: Uterus Measurements: 8.9 x 4.4 x 4.9 cm = volume: 99 mL. Heterogeneous myometrium. No focal mass Endometrium Thickness: 5 mm.  Small amount of endometrial fluid.  No focal mass. Right ovary Measurements: 2.5 x 1.9 x 2.3 cm = volume: 5.7 mL. Dominant follicle without mass. Blood flow present within RIGHT ovary on color Doppler imaging. Left ovary Measurements: 3.3 x 2.0 x 1.9 cm = volume: 6.4 mL. Normal morphology without mass. Blood flow present within LEFT ovary on color Doppler imaging. Pulsed Doppler evaluation of both ovaries demonstrates normal low-resistance arterial and venous waveforms. Other findings No free pelvic fluid.  No adnexal masses. IMPRESSION: Small amount of nonspecific endometrial fluid. Otherwise normal exam. Electronically Signed   By:  Ulyses Southward M.D.   On: 06/29/2019 23:09    ________________________________  Procedures   ____________________________________________   INITIAL IMPRESSION / MDM / ASSESSMENT AND PLAN / ED COURSE  As part of my medical decision making, I reviewed the following data within the electronic MEDICAL RECORD NUMBER   48 year old female presenting with above-stated history and physical exam secondary to "cervix and ovary pain".  Considered possibility of ovarian cysts PID STD.  Ultrasound revealed nonspecific endometrial fluid.  Given the fact that the pain was predominantly to the right CT scan performed to evaluate for possible appendicitis however unlikely.  CT revealed no evidence of appendicitis.  I suspect the patient's pain to be secondary to PID given reported history and clinical exam.  Patient given ceftriaxone 2 and 50 mg  and will be prescribed doxycycline for home for 14 days.  Patient given tramadol prescription for pain at home.  Patient will be referred to gynecology for further outpatient evaluation  ____________________________________________  FINAL CLINICAL IMPRESSION(S) / ED DIAGNOSES  Final diagnoses:  Right lower quadrant pain  PID (acute pelvic inflammatory disease)     MEDICATIONS GIVEN DURING THIS VISIT:  Medications  oxyCODONE-acetaminophen (PERCOCET/ROXICET) 5-325 MG per tablet 1 tablet (1 tablet Oral Given 06/29/19 2048)  cefTRIAXone (ROCEPHIN) injection 250 mg (has no administration in time range)  azithromycin (ZITHROMAX) tablet 1,000 mg (has no administration in time range)  morphine 4 MG/ML injection 4 mg (4 mg Intravenous Given 06/30/19 0027)  ondansetron (ZOFRAN) injection 4 mg (4 mg Intravenous Given 06/30/19 0027)     ED Discharge Orders         Ordered    doxycycline (ADOXA) 100 MG tablet  2 times daily     06/30/19 0122    traMADol (ULTRAM) 50 MG tablet  Every 6 hours PRN     06/30/19 0122          *Please note:  Karina Anderson was evaluated in Emergency Department on 06/30/2019 for the symptoms described in the history of present illness. She was evaluated in the context of the global COVID-19 pandemic, which necessitated consideration that the patient might be at risk for infection with the SARS-CoV-2 virus that causes COVID-19. Institutional protocols and algorithms that pertain to the evaluation of patients at risk for COVID-19 are in a state of rapid change based on information released by regulatory bodies including the CDC and federal and state organizations. These policies and algorithms were followed during the patient's care in the ED.  Some ED evaluations and interventions may be delayed as a result of limited staffing during the pandemic.*  Note:  This document was prepared using Dragon voice recognition software and may include unintentional dictation errors.    Darci Current, MD 06/30/19 [phone]

## 2019-07-02 ENCOUNTER — Other Ambulatory Visit: Payer: Self-pay | Admitting: Oncology

## 2019-07-02 DIAGNOSIS — R911 Solitary pulmonary nodule: Secondary | ICD-10-CM

## 2019-07-02 NOTE — Progress Notes (Signed)
  Pulmonary Nodule Clinic Telephone Note  Received referral from [place] [facility].  Patient has been evaluated on several occasions for abdominal pain in the emergency department. Most recent work-up included a gynecological exam, labs and a urinalysis which were unremarkable.  A CT abdomen/pelvis revealed small 5 mm right middle lobe pulmonary nodule.  If patient is considered high risk it was recommended a follow-up in approximately 12 months.  I recommend follow-up with noncontrasted CT scan of the chest in approximately 12 months from original.  Patient is a current everyday smoker.  Has history of smoking for 20 +years and at least 1 PPD.  High risk factors include: History of heavy smoking, exposure to asbestos, radium or uranium, personal family history of lung cancer, older age, sex (females greater than males), race (black and native [place] greater than weight), marginal speculation, upper lobe location, multiplicity (less than 5 nodules increases risk for malignancy) and emphysema and/or pulmonary fibrosis.   This recommendation follows the consensus statement: Guidelines for Management of Incidental Pulmonary Nodules Detected on CT Images: From the Fleischner Society 2017; Radiology 2017; 284:228-243.    I have placed order for CT scan without contrast to be completed approximately 12 from previous CT scan.    I will touch base with patient and schedule him/her virtually for results of the CT scan and recommendations per Fleischner's guidelines and our pulmonary nodule clinic.  Faythe Casa, NP 07/02/2019 3:36 PM

## 2019-07-03 LAB — GC/CHLAMYDIA PROBE AMP
Chlamydia trachomatis, NAA: NEGATIVE
Neisseria Gonorrhoeae by PCR: NEGATIVE

## 2019-07-15 ENCOUNTER — Telehealth: Payer: Self-pay | Admitting: *Deleted

## 2019-07-15 NOTE — Telephone Encounter (Signed)
Pt made aware of referral to Lung Nodule Clinic from PCP. Pt is in agreement to have upcoming CT scan and follow up as recommended.  Pt has been made aware of upcoming appts for follow up CT scan and follow up appt with Jennifer Burns, NP in the Lung Nodule Clinic. Pt verbalized understanding. Nothing further needed at this time.  

## 2019-09-10 ENCOUNTER — Encounter: Payer: Self-pay | Admitting: Emergency Medicine

## 2019-09-10 ENCOUNTER — Other Ambulatory Visit: Payer: Self-pay

## 2019-09-10 ENCOUNTER — Emergency Department
Admission: EM | Admit: 2019-09-10 | Discharge: 2019-09-10 | Disposition: A | Discharge status: Home / Self Care | Payer: Medicaid Other | Attending: Emergency Medicine | Admitting: Emergency Medicine

## 2019-09-10 ENCOUNTER — Emergency Department: Payer: Medicaid Other

## 2019-09-10 DIAGNOSIS — R05 Cough: Secondary | ICD-10-CM | POA: Diagnosis not present

## 2019-09-10 DIAGNOSIS — R0600 Dyspnea, unspecified: Secondary | ICD-10-CM | POA: Diagnosis not present

## 2019-09-10 DIAGNOSIS — R102 Pelvic and perineal pain: Secondary | ICD-10-CM | POA: Diagnosis not present

## 2019-09-10 DIAGNOSIS — Z79899 Other long term (current) drug therapy: Secondary | ICD-10-CM | POA: Insufficient documentation

## 2019-09-10 DIAGNOSIS — F17210 Nicotine dependence, cigarettes, uncomplicated: Secondary | ICD-10-CM | POA: Insufficient documentation

## 2019-09-10 DIAGNOSIS — J069 Acute upper respiratory infection, unspecified: Secondary | ICD-10-CM | POA: Insufficient documentation

## 2019-09-10 DIAGNOSIS — E119 Type 2 diabetes mellitus without complications: Secondary | ICD-10-CM | POA: Diagnosis not present

## 2019-09-10 DIAGNOSIS — Z20822 Contact with and (suspected) exposure to covid-19: Secondary | ICD-10-CM | POA: Insufficient documentation

## 2019-09-10 DIAGNOSIS — G8929 Other chronic pain: Secondary | ICD-10-CM | POA: Diagnosis not present

## 2019-09-10 LAB — URINALYSIS, COMPLETE (UACMP) WITH MICROSCOPIC
Bacteria, UA: NONE SEEN
Bilirubin Urine: NEGATIVE
Glucose, UA: NEGATIVE mg/dL
Hgb urine dipstick: NEGATIVE
Ketones, ur: NEGATIVE mg/dL
Leukocytes,Ua: NEGATIVE
Nitrite: NEGATIVE
Protein, ur: NEGATIVE mg/dL
Specific Gravity, Urine: 1.023 (ref 1.005–1.030)
pH: 7 (ref 5.0–8.0)

## 2019-09-10 LAB — CBC
HCT: 37.6 % (ref 36.0–46.0)
Hemoglobin: 12.1 g/dL (ref 12.0–15.0)
MCH: 27.4 pg (ref 26.0–34.0)
MCHC: 32.2 g/dL (ref 30.0–36.0)
MCV: 85.1 fL (ref 80.0–100.0)
Platelets: 314 10*3/uL (ref 150–400)
RBC: 4.42 MIL/uL (ref 3.87–5.11)
RDW: 14.7 % (ref 11.5–15.5)
WBC: 5.7 10*3/uL (ref 4.0–10.5)
nRBC: 0 % (ref 0.0–0.2)

## 2019-09-10 LAB — COMPREHENSIVE METABOLIC PANEL
ALT: 12 U/L (ref 0–44)
AST: 16 U/L (ref 15–41)
Albumin: 4 g/dL (ref 3.5–5.0)
Alkaline Phosphatase: 64 U/L (ref 38–126)
Anion gap: 8 (ref 5–15)
BUN: 19 mg/dL (ref 6–20)
CO2: 27 mmol/L (ref 22–32)
Calcium: 9.8 mg/dL (ref 8.9–10.3)
Chloride: 105 mmol/L (ref 98–111)
Creatinine, Ser: 0.87 mg/dL (ref 0.44–1.00)
GFR calc Af Amer: >60 mL/min (ref >60)
GFR calc non Af Amer: >60 mL/min (ref >60)
Glucose, Bld: 97 mg/dL (ref 70–99)
Potassium: 3.9 mmol/L (ref 3.5–5.1)
Sodium: 140 mmol/L (ref 135–145)
Total Bilirubin: 0.5 mg/dL (ref 0.3–1.2)
Total Protein: 8.1 g/dL (ref 6.5–8.1)

## 2019-09-10 LAB — SARS CORONAVIRUS 2 (TAT 6-24 HRS): SARS Coronavirus 2: NEGATIVE

## 2019-09-10 LAB — POCT PREGNANCY, URINE: Preg Test, Ur: NEGATIVE

## 2019-09-10 LAB — LIPASE, BLOOD: Lipase: 22 U/L (ref 11–51)

## 2019-09-10 MED ORDER — DICYCLOMINE HCL 20 MG PO TABS: 20.0000 mg | ORAL_TABLET | Freq: Three times a day (TID) | ORAL | 0 refills | Status: DC | PRN

## 2019-09-10 MED ORDER — SODIUM CHLORIDE 0.9% FLUSH: 3.0000 mL | Freq: Once | INTRAVENOUS | Status: DC

## 2019-09-10 MED ORDER — AZITHROMYCIN 500 MG PO TABS: 500.0000 mg | ORAL_TABLET | Freq: Every day | ORAL | 0 refills | Status: AC

## 2019-09-10 NOTE — ED Provider Notes (Signed)
[facility] Emergency Department Provider Note  ____________________________________________   First MD Initiated Contact with Patient 09/10/19 1347     (approximate)  I have reviewed the triage vital signs and the nursing notes.   HISTORY  Chief Complaint Nausea, Shortness of Breath, and Abdominal Pain    HPI Karina Anderson is a 49 y.o. female close to previous medical conditions presents to the emergency department secondary to dyspnea and cough x5 days.  Patient denies any fever.  Patient denies any nausea vomiting diarrhea constipation.  Patient denies any chest pain.  Patient denies any exposure to COVID-19.  Of note patient smokes 12 cigarettes/day and has been doing so for many years.        Past Medical History:  Diagnosis Date  . Diabetes mellitus without complication (HCC)   . Hydradenitis   . Seizures [facility])     Patient Active Problem List   Diagnosis Date Noted  . Tobacco use disorder 03/13/2016  . Cannabis use disorder, moderate, dependence (HCC) 03/13/2016  . Bipolar affective disorder, manic, severe, with psychotic behavior (HCC) 03/12/2016  . Seizures (HCC) 03/11/2016  . Involuntary commitment 03/11/2016    History reviewed. No pertinent surgical history.  Prior to Admission medications   Medication Sig Start Date End Date Taking? Authorizing Provider  azithromycin (ZITHROMAX) 500 MG tablet Take 1 tablet (500 mg total) by mouth daily for 3 days. Take 1 tablet daily for 3 days. 09/10/19 09/13/19  Darci Current, MD  ciprofloxacin (CIPRO) 500 MG tablet Take 1 tablet (500 mg total) by mouth 2 (two) times daily. 03/16/19   Irean Hong, MD  cyclobenzaprine (FLEXERIL) 5 MG tablet Take 1-2 tablets 3 times daily as needed 09/28/18   Enid Derry, PA-C  dicyclomine (BENTYL) 20 MG tablet Take 1 tablet (20 mg total) by mouth 3 (three) times daily as needed for spasms. 09/10/19 09/09/20  Darci Current, MD  doxylamine, Sleep, (UNISOM) 25  MG tablet Take 50 mg by mouth at bedtime as needed.    [provider]  famotidine (PEPCID) 40 MG tablet Take 1 tablet (40 mg total) by mouth every evening. 09/27/17 09/27/18  Myrna Blazer, MD  famotidine (PEPCID) 40 MG tablet Take 1 tablet (40 mg total) by mouth every evening. 12/25/17 12/25/18  Rebecka Apley, MD  hydrOXYzine (ATARAX/VISTARIL) 50 MG tablet Take 1 tablet (50 mg total) by mouth every 6 (six) hours as needed for anxiety. Patient not taking: Reported on 07/23/2017 03/29/16   Pucilowska, Ellin Goodie, MD  lactulose (CHRONULAC) 10 GM/15ML solution Take 30 mLs (20 g total) by mouth daily as needed for mild constipation. 03/16/19   Irean Hong, MD  lidocaine (LIDODERM) 5 % Place 1 patch onto the skin daily. Remove & Discard patch within 12 hours or as directed by MD 09/28/18   Enid Derry, PA-C  methocarbamol (ROBAXIN) 500 MG tablet Take 1 tablet (500 mg total) by mouth 4 (four) times daily. 11/18/17   Cuthriell, Delorise Royals, PA-C  metoCLOPramide (REGLAN) 10 MG tablet Take 1 tablet (10 mg total) by mouth every 8 (eight) hours as needed. 12/25/17   Rebecka Apley, MD  ondansetron (ZOFRAN ODT) 4 MG disintegrating tablet Take 1 tablet (4 mg total) by mouth every 8 (eight) hours as needed for nausea or vomiting. 03/16/19   Irean Hong, MD  oxyCODONE-acetaminophen (PERCOCET/ROXICET) 5-325 MG tablet Take 1 tablet by mouth every 4 (four) hours as needed for severe pain. 03/16/19   Dolores Frame,  Gretchen Short, MD  phenazopyridine (PYRIDIUM) 200 MG tablet Take 1 tablet (200 mg total) by mouth 3 (three) times daily as needed for pain. Patient not taking: Reported on 07/23/2017 05/19/16   Menshew, Dannielle Karvonen, PA-C  phenytoin (DILANTIN) 100 MG ER capsule Take 1 capsule (100 mg total) by mouth 3 (three) times daily. Patient not taking: Reported on 07/23/2017 03/29/16   Clovis Fredrickson, MD  predniSONE (DELTASONE) 50 MG tablet Take 1 tab per day 09/29/18   Laban Emperor, PA-C  QUEtiapine  (SEROQUEL) 400 MG tablet Take 2 tablets (800 mg total) by mouth at bedtime. Patient not taking: Reported on 07/23/2017 03/29/16   Pucilowska, Herma Ard B, MD  sucralfate (CARAFATE) 1 g tablet Take 1 tablet (1 g total) by mouth 2 (two) times daily. 12/25/17   Loney Hering, MD  traMADol (ULTRAM) 50 MG tablet Take 1 tablet (50 mg total) by mouth every 6 (six) hours as needed. 06/30/19 06/29/20  Gregor Hams, MD    Allergies Citrus, Sulfamethoxazole-trimethoprim, Butorphanol, Butorphanol tartrate, Contrast media [iodinated diagnostic agents], Food, Ibuprofen, Ketorolac, Lorazepam, Naproxen, Prochlorperazine, and Nsaids  No family history on file.  Social History Social History   Tobacco Use  . Smoking status: Current Every Day Smoker    Packs/day: 0.50    Types: Cigarettes  . Smokeless tobacco: Never Used  Substance Use Topics  . Alcohol use: No  . Drug use: No    Review of Systems Constitutional: No fever/chills Eyes: No visual changes. ENT: No sore throat. Cardiovascular: Denies chest pain. Respiratory: Positive for cough and dyspnea Gastrointestinal: No abdominal pain.  No nausea, no vomiting.  No diarrhea.  No constipation. Genitourinary: Negative for dysuria. Musculoskeletal: Negative for neck pain.  Negative for back pain. Integumentary: Negative for rash. Neurological: Negative for headaches, focal weakness or numbness.   ____________________________________________   PHYSICAL EXAM:  VITAL SIGNS: ED Triage Vitals  Enc Vitals Group     BP 09/10/19 1247 106/90     Pulse Rate 09/10/19 1247 99     Resp 09/10/19 1247 20     Temp 09/10/19 1247 98.5 F (36.9 C)     Temp Source 09/10/19 1247 Oral     SpO2 09/10/19 1247 100 %     Weight 09/10/19 1249 80.3 kg (177 lb)     Height 09/10/19 1249 1.651 m (5\' 5" )     Head Circumference --      Peak Flow --      Pain Score 09/10/19 1248 10     Pain Loc --      Pain Edu? --      Excl. in [place]? --     Constitutional:  Alert and oriented.  Eyes: Conjunctivae are normal.  Mouth/Throat: Patient is wearing a mask. Neck: No stridor.  No meningeal signs.   Cardiovascular: Normal rate, regular rhythm. Good peripheral circulation. Grossly normal heart sounds. Respiratory: Normal respiratory effort.  No retractions. Gastrointestinal: Soft and nontender. No distention.  Musculoskeletal: No lower extremity tenderness nor edema. No gross deformities of extremities. Neurologic:  Normal speech and language. No gross focal neurologic deficits are appreciated.  Skin:  Skin is warm, dry and intact. Psychiatric: Mood and affect are normal. Speech and behavior are normal.  ____________________________________________   LABS (all labs ordered are listed, but only abnormal results are displayed)  Labs Reviewed  URINALYSIS, COMPLETE (UACMP) WITH MICROSCOPIC - Abnormal; Notable for the following components:      Result Value   Color, Urine YELLOW (*)  APPearance CLEAR (*)    All other components within normal limits  SARS CORONAVIRUS 2 (TAT 6-24 HRS)  LIPASE, BLOOD  COMPREHENSIVE METABOLIC PANEL  CBC  POC URINE PREG, ED  POCT PREGNANCY, URINE  POC SARS CORONAVIRUS 2 AG -  ED   ______________________  RADIOLOGY I, Darci Current, personally viewed and evaluated these images (plain radiographs) as part of my medical decision making, as well as reviewing the written report by the radiologist.  ED MD interpretation: No acute abnormality of the lungs on chest x-ray per radiologist.  Official radiology report(s): DG Chest Port 1 View  Result Date: 09/10/2019 CLINICAL DATA:  Dyspnea EXAM: PORTABLE CHEST 1 VIEW COMPARISON:  12/25/2017 FINDINGS: The heart size and mediastinal contours are within normal limits. Both lungs are clear. The visualized skeletal structures are unremarkable. IMPRESSION: No acute abnormality of the lungs in AP portable projection. Electronically Signed   By: Lauralyn Primes M.D.   On: 09/10/2019  14:16     Procedures   ____________________________________________   INITIAL IMPRESSION / MDM / ASSESSMENT AND PLAN / ED COURSE  As part of my medical decision making, I reviewed the following data within the electronic MEDICAL RECORD NUMBER   48 year old female presented with above-stated history and physical exam differential diagnosis including URI, bronchitis, less likely pneumonia, COVID-19.  COVID-19 testing performed and is pending.  Chest x-ray revealed no acute abnormality.  Patient is requesting something for her chronic pelvic pain.  Patient be referred to gynecology for further outpatient evaluation  ____________________________________________  FINAL CLINICAL IMPRESSION(S) / ED DIAGNOSES  Final diagnoses:  Upper respiratory tract infection, unspecified type  Chronic pelvic pain in female     MEDICATIONS GIVEN DURING THIS VISIT:  Medications  sodium chloride flush (NS) 0.9 % injection 3 mL (3 mLs Intravenous Not Given 09/10/19 1359)     ED Discharge Orders         Ordered    azithromycin (ZITHROMAX) 500 MG tablet  Daily     09/10/19 1422    dicyclomine (BENTYL) 20 MG tablet  3 times daily PRN     09/10/19 1426          *Please note:  Karina Bhargava was evaluated in Emergency Department on 09/10/2019 for the symptoms described in the history of present illness. She was evaluated in the context of the global COVID-19 pandemic, which necessitated consideration that the patient might be at risk for infection with the SARS-CoV-2 virus that causes COVID-19. Institutional protocols and algorithms that pertain to the evaluation of patients at risk for COVID-19 are in a state of rapid change based on information released by regulatory bodies including the CDC and federal and state organizations. These policies and algorithms were followed during the patient's care in the ED.  Some ED evaluations and interventions may be delayed as a result of limited staffing during the  pandemic.*  Note:  This document was prepared using Dragon voice recognition software and may include unintentional dictation errors.   Darci Current, MD 09/10/19 [phone]

## 2019-09-10 NOTE — ED Notes (Signed)
X-ray at bedside

## 2019-09-10 NOTE — ED Triage Notes (Signed)
Pt here with c/o nausea for the past 4 days now, occasional shob. Denies known covid exposure. NAD.

## 2019-09-10 NOTE — ED Triage Notes (Signed)
Also c/o RLQ pain occasionally. Has had ovarian cysts in the past, states this feels the same.

## 2019-09-10 NOTE — ED Notes (Signed)
Pt c/o lower back pain and right groin pain. Nausea and decreased appetite for few days. No fever or cough. Pt has covid exposure.  No fever. No headache or sore throat.

## 2019-10-18 ENCOUNTER — Emergency Department
Admission: EM | Admit: 2019-10-18 | Discharge: 2019-10-18 | Disposition: A | Discharge status: Home / Self Care | Payer: Medicaid Other | Attending: Emergency Medicine | Admitting: Emergency Medicine

## 2019-10-18 ENCOUNTER — Encounter: Payer: Self-pay | Admitting: Emergency Medicine

## 2019-10-18 ENCOUNTER — Emergency Department: Payer: Medicaid Other

## 2019-10-18 ENCOUNTER — Other Ambulatory Visit: Payer: Self-pay

## 2019-10-18 DIAGNOSIS — E119 Type 2 diabetes mellitus without complications: Secondary | ICD-10-CM | POA: Diagnosis not present

## 2019-10-18 DIAGNOSIS — R109 Unspecified abdominal pain: Secondary | ICD-10-CM | POA: Diagnosis not present

## 2019-10-18 DIAGNOSIS — F17210 Nicotine dependence, cigarettes, uncomplicated: Secondary | ICD-10-CM | POA: Diagnosis not present

## 2019-10-18 DIAGNOSIS — Z7984 Long term (current) use of oral hypoglycemic drugs: Secondary | ICD-10-CM | POA: Diagnosis not present

## 2019-10-18 DIAGNOSIS — N200 Calculus of kidney: Secondary | ICD-10-CM | POA: Diagnosis not present

## 2019-10-18 DIAGNOSIS — Z79899 Other long term (current) drug therapy: Secondary | ICD-10-CM | POA: Insufficient documentation

## 2019-10-18 LAB — CBC
HCT: 37.2 % (ref 36.0–46.0)
Hemoglobin: 12 g/dL (ref 12.0–15.0)
MCH: 27.2 pg (ref 26.0–34.0)
MCHC: 32.3 g/dL (ref 30.0–36.0)
MCV: 84.4 fL (ref 80.0–100.0)
Platelets: 327 10*3/uL (ref 150–400)
RBC: 4.41 MIL/uL (ref 3.87–5.11)
RDW: 14.7 % (ref 11.5–15.5)
WBC: 5 10*3/uL (ref 4.0–10.5)
nRBC: 0 % (ref 0.0–0.2)

## 2019-10-18 LAB — BASIC METABOLIC PANEL
Anion gap: 7 (ref 5–15)
BUN: 20 mg/dL (ref 6–20)
CO2: 28 mmol/L (ref 22–32)
Calcium: 9.3 mg/dL (ref 8.9–10.3)
Chloride: 104 mmol/L (ref 98–111)
Creatinine, Ser: 0.89 mg/dL (ref 0.44–1.00)
GFR calc Af Amer: >60 mL/min (ref >60)
GFR calc non Af Amer: >60 mL/min (ref >60)
Glucose, Bld: 105 mg/dL — ABNORMAL HIGH (ref 70–99)
Potassium: 3.6 mmol/L (ref 3.5–5.1)
Sodium: 139 mmol/L (ref 135–145)

## 2019-10-18 LAB — URINALYSIS, COMPLETE (UACMP) WITH MICROSCOPIC
Bilirubin Urine: NEGATIVE
Glucose, UA: NEGATIVE mg/dL
Hgb urine dipstick: NEGATIVE
Ketones, ur: NEGATIVE mg/dL
Nitrite: NEGATIVE
Protein, ur: NEGATIVE mg/dL
Specific Gravity, Urine: 1.025 (ref 1.005–1.030)
pH: 6 (ref 5.0–8.0)

## 2019-10-18 LAB — POCT PREGNANCY, URINE: Preg Test, Ur: NEGATIVE

## 2019-10-18 MED ORDER — HYDROCODONE-ACETAMINOPHEN 5-325 MG PO TABS
1.0000 | ORAL_TABLET | Freq: Once | ORAL | Status: DC
  Filled 2019-10-18: qty 1

## 2019-10-18 MED ORDER — OXYCODONE-ACETAMINOPHEN 5-325 MG PO TABS
1.0000 | ORAL_TABLET | Freq: Once | ORAL | Status: AC
  Administered 2019-10-18: 1 via ORAL
  Filled 2019-10-18: qty 1

## 2019-10-18 MED ORDER — OXYCODONE-ACETAMINOPHEN 5-325 MG PO TABS: 1.0000 | ORAL_TABLET | Freq: Four times a day (QID) | ORAL | 0 refills | Status: DC | PRN

## 2019-10-18 NOTE — ED Triage Notes (Signed)
Pt presents to ED c/o R flank pain x2 days.

## 2019-10-18 NOTE — ED Provider Notes (Signed)
[facility] Emergency Department Provider Note  ____________________________________________  Time seen: Approximately 5:26 PM  I have reviewed the triage vital signs and the nursing notes.   HISTORY  Chief Complaint Flank Pain    HPI Karina Anderson is a 49 y.o. female who presents the emergency department complaining of right flank pain "where my kidneys are." Patient states that she has right flank pain times the last 3 to 4 days. Patient states that she is having pain radiating from her right flank into the right abdomen. Pain started in her back, states that initially it was improving with self massage, warm baths. Patient states that now the pain is constant. She does endorse some intermittent hematuria. Patient states that "a lot of my family murders have had kidney stones but have never had one." Patient denies any fevers or chills. Patient denies any dysuria, polyuria, vaginal bleeding or discharge. No diarrhea or constipation. No emesis. Patient has a history of diabetes, seizures and denies any complaints of chronic medical problems. Patient also states that in the past she has had a cyst on her ovaries. She states that she believes it was on the right side.         Past Medical History:  Diagnosis Date  . Diabetes mellitus without complication (HCC)   . Hydradenitis   . Seizures [facility])     Patient Active Problem List   Diagnosis Date Noted  . Tobacco use disorder 03/13/2016  . Cannabis use disorder, moderate, dependence (HCC) 03/13/2016  . Bipolar affective disorder, manic, severe, with psychotic behavior (HCC) 03/12/2016  . Seizures (HCC) 03/11/2016  . Involuntary commitment 03/11/2016    History reviewed. No pertinent surgical history.  Prior to Admission medications   Medication Sig Start Date End Date Taking? Authorizing Provider  ciprofloxacin (CIPRO) 500 MG tablet Take 1 tablet (500 mg total) by mouth 2 (two) times daily. 03/16/19   Irean Hong, MD  cyclobenzaprine (FLEXERIL) 5 MG tablet Take 1-2 tablets 3 times daily as needed 09/28/18   Enid Derry, PA-C  dicyclomine (BENTYL) 20 MG tablet Take 1 tablet (20 mg total) by mouth 3 (three) times daily as needed for spasms. 09/10/19 09/09/20  Darci Current, MD  doxylamine, Sleep, (UNISOM) 25 MG tablet Take 50 mg by mouth at bedtime as needed.    [provider]  famotidine (PEPCID) 40 MG tablet Take 1 tablet (40 mg total) by mouth every evening. 09/27/17 09/27/18  Myrna Blazer, MD  famotidine (PEPCID) 40 MG tablet Take 1 tablet (40 mg total) by mouth every evening. 12/25/17 12/25/18  Rebecka Apley, MD  hydrOXYzine (ATARAX/VISTARIL) 50 MG tablet Take 1 tablet (50 mg total) by mouth every 6 (six) hours as needed for anxiety. Patient not taking: Reported on 07/23/2017 03/29/16   Pucilowska, Ellin Goodie, MD  lactulose (CHRONULAC) 10 GM/15ML solution Take 30 mLs (20 g total) by mouth daily as needed for mild constipation. 03/16/19   Irean Hong, MD  lidocaine (LIDODERM) 5 % Place 1 patch onto the skin daily. Remove & Discard patch within 12 hours or as directed by MD 09/28/18   Enid Derry, PA-C  methocarbamol (ROBAXIN) 500 MG tablet Take 1 tablet (500 mg total) by mouth 4 (four) times daily. 11/18/17   Talma Aguillard, Delorise Royals, PA-C  metoCLOPramide (REGLAN) 10 MG tablet Take 1 tablet (10 mg total) by mouth every 8 (eight) hours as needed. 12/25/17   Rebecka Apley, MD  ondansetron (ZOFRAN ODT) 4 MG  disintegrating tablet Take 1 tablet (4 mg total) by mouth every 8 (eight) hours as needed for nausea or vomiting. 03/16/19   Paulette Blanch, MD  oxyCODONE-acetaminophen (PERCOCET/ROXICET) 5-325 MG tablet Take 1 tablet by mouth every 4 (four) hours as needed for severe pain. 03/16/19   Paulette Blanch, MD  oxyCODONE-acetaminophen (PERCOCET/ROXICET) 5-325 MG tablet Take 1 tablet by mouth every 6 (six) hours as needed for severe pain. 10/18/19   Rithy Mandley, Charline Bills, PA-C   phenazopyridine (PYRIDIUM) 200 MG tablet Take 1 tablet (200 mg total) by mouth 3 (three) times daily as needed for pain. Patient not taking: Reported on 07/23/2017 05/19/16   Menshew, Dannielle Karvonen, PA-C  phenytoin (DILANTIN) 100 MG ER capsule Take 1 capsule (100 mg total) by mouth 3 (three) times daily. Patient not taking: Reported on 07/23/2017 03/29/16   Clovis Fredrickson, MD  predniSONE (DELTASONE) 50 MG tablet Take 1 tab per day 09/29/18   Laban Emperor, PA-C  QUEtiapine (SEROQUEL) 400 MG tablet Take 2 tablets (800 mg total) by mouth at bedtime. Patient not taking: Reported on 07/23/2017 03/29/16   Pucilowska, Herma Ard B, MD  sucralfate (CARAFATE) 1 g tablet Take 1 tablet (1 g total) by mouth 2 (two) times daily. 12/25/17   Loney Hering, MD  traMADol (ULTRAM) 50 MG tablet Take 1 tablet (50 mg total) by mouth every 6 (six) hours as needed. 06/30/19 06/29/20  Gregor Hams, MD    Allergies Citrus, Sulfamethoxazole-trimethoprim, Butorphanol, Butorphanol tartrate, Contrast media [iodinated diagnostic agents], Food, Ibuprofen, Ketorolac, Lorazepam, Naproxen, Prochlorperazine, and Nsaids  History reviewed. No pertinent family history.  Social History Social History   Tobacco Use  . Smoking status: Current Every Day Smoker    Packs/day: 0.50    Types: Cigarettes  . Smokeless tobacco: Never Used  Substance Use Topics  . Alcohol use: No  . Drug use: No     Review of Systems  Constitutional: No fever/chills Eyes: No visual changes. No discharge ENT: No upper respiratory complaints. Cardiovascular: no chest pain. Respiratory: no cough. No SOB. Gastrointestinal: Flank pain radiating into the right side. No other abdominal pain.  No nausea, no vomiting.  No diarrhea.  No constipation. Genitourinary: Negative for dysuria. Positive for hematuria and flank pain. Musculoskeletal: Negative for musculoskeletal pain. Skin: Negative for rash, abrasions, lacerations,  ecchymosis. Neurological: Negative for headaches, focal weakness or numbness. 10-point ROS otherwise negative.  ____________________________________________   PHYSICAL EXAM:  VITAL SIGNS: ED Triage Vitals  Enc Vitals Group     BP 10/18/19 1346 (!) 146/91     Pulse Rate 10/18/19 1346 86     Resp 10/18/19 1346 17     Temp 10/18/19 1346 98.2 F (36.8 C)     Temp Source 10/18/19 1346 Oral     SpO2 10/18/19 1346 98 %     Weight 10/18/19 1346 179 lb (81.2 kg)     Height 10/18/19 1346 5\' 5"  (1.651 m)     Head Circumference --      Peak Flow --      Pain Score 10/18/19 1348 10     Pain Loc --      Pain Edu? --      Excl. in [place]? --      Constitutional: Alert and oriented. Well appearing and in no acute distress. Eyes: Conjunctivae are normal. PERRL. EOMI. Head: Atraumatic. Neck: No stridor.    Cardiovascular: Normal rate, regular rhythm. Normal S1 and S2.  Good peripheral circulation. Respiratory: Normal  respiratory effort without tachypnea or retractions. Lungs CTAB. Good air entry to the bases with no decreased or absent breath sounds. Gastrointestinal: No visible external abdominal findings. Bowel sounds 4 quadrants. Soft and nontender to palpation all 4 quadrants.. No guarding or rigidity. No palpable masses. No distention. Positive for right-sided CVA tenderness. Musculoskeletal: Full range of motion to all extremities. No gross deformities appreciated. Neurologic:  Normal speech and language. No gross focal neurologic deficits are appreciated.  Skin:  Skin is warm, dry and intact. No rash noted. Psychiatric: Mood and affect are normal. Speech and behavior are normal. Patient exhibits appropriate insight and judgement.   ____________________________________________   LABS (all labs ordered are listed, but only abnormal results are displayed)  Labs Reviewed  URINALYSIS, COMPLETE (UACMP) WITH MICROSCOPIC - Abnormal; Notable for the following components:      Result Value    Color, Urine YELLOW (*)    APPearance HAZY (*)    Leukocytes,Ua TRACE (*)    Bacteria, UA RARE (*)    All other components within normal limits  BASIC METABOLIC PANEL - Abnormal; Notable for the following components:   Glucose, Bld 105 (*)    All other components within normal limits  CBC  POC URINE PREG, ED  POCT PREGNANCY, URINE   ____________________________________________  EKG   ____________________________________________  RADIOLOGY I personally viewed and evaluated these images as part of my medical decision making, as well as reviewing the written report by the radiologist.  CT Renal Stone Study  Result Date: 10/18/2019 CLINICAL DATA:  49 year old female with right flank pain. EXAM: CT ABDOMEN AND PELVIS WITHOUT CONTRAST TECHNIQUE: Multidetector CT imaging of the abdomen and pelvis was performed following the standard protocol without IV contrast. COMPARISON:  CT abdomen pelvis dated 06/30/2019. FINDINGS: Evaluation of this exam is limited in the absence of intravenous contrast. Lower chest: The visualized lung bases are clear. No intra-abdominal free air or free fluid. Hepatobiliary: No focal liver abnormality is seen. No gallstones, gallbladder wall thickening, or biliary dilatation. Pancreas: Unremarkable. No pancreatic ductal dilatation or surrounding inflammatory changes. Spleen: Normal in size without focal abnormality. Adrenals/Urinary Tract: The adrenal glands are unremarkable. There is a punctate stone in the distal right ureter (series 2, image 64 and coronal series 5, image 67). There is no hydronephrosis on either side. No stone identified within the kidneys. The urinary bladder is grossly unremarkable. Stomach/Bowel: There is no bowel obstruction or active inflammation. The appendix is normal. Vascular/Lymphatic: The abdominal aorta and IVC are grossly unremarkable. No portal venous gas. There is no adenopathy. Reproductive: The uterus is anteverted and grossly  unremarkable. Bilateral tubal ligation clips. The left tubal ligation clips are displaced and located in the posterior pelvis. This is similar to prior CT. Other: Small fat containing umbilical hernia. Musculoskeletal: No acute or significant osseous findings. IMPRESSION: 1. Punctate nonobstructing distal right ureteral calculus. No hydronephrosis. 2. Displaced left tubal ligation clips in the posterior pelvis similar to prior CT. Electronically Signed   By: Elgie Collard M.D.   On: 10/18/2019 18:32    ____________________________________________    PROCEDURES  Procedure(s) performed:    Procedures    Medications  oxyCODONE-acetaminophen (PERCOCET/ROXICET) 5-325 MG per tablet 1 tablet (1 tablet Oral Given 10/18/19 1844)     ____________________________________________   INITIAL IMPRESSION / ASSESSMENT AND PLAN / ED COURSE  Pertinent labs & imaging results that were available during my care of the patient were reviewed by me and considered in my medical decision making (see chart  for details).  Review of the [place] CSRS was performed in accordance of the [facility] prior to dispensing any controlled drugs.           Patient's diagnosis is consistent with kidney stone.  Patient presents emergency department complaining of right-sided/right flank pain x2 to 3 days.  Patient did have some mild right-sided CVA tenderness.  No other significant physical exam finding.  No history of kidney stone but states a strong familial history of kidney stones.  Differential included pyelonephritis, nephrolithiasis, ovarian cyst, ovarian torsion, appendicitis.  CT scan reveals distal ureteral kidney stone with no evidence of hydronephrosis or obstruction.  Patient will be prescribed pain medication as she is unable to take NSAIDs.  Drink plenty of fluids.  Follow-up primary care as needed..Patient is given ED precautions to return to the ED for any worsening or new  symptoms.     ____________________________________________  FINAL CLINICAL IMPRESSION(S) / ED DIAGNOSES  Final diagnoses:  Kidney stone      NEW MEDICATIONS STARTED DURING THIS VISIT:  ED Discharge Orders         Ordered    oxyCODONE-acetaminophen (PERCOCET/ROXICET) 5-325 MG tablet  Every 6 hours PRN     10/18/19 1844              This chart was dictated using voice recognition software/Dragon. Despite best efforts to proofread, errors can occur which can change the meaning. Any change was purely unintentional.    Racheal Patches, PA-C 10/18/19 1845    Emily Filbert, MD 10/18/19 Mikle Bosworth

## 2019-10-18 NOTE — ED Notes (Signed)
Patient was given apple juice, crackers and a telephone to call for a ride.

## 2019-10-31 ENCOUNTER — Encounter: Payer: Self-pay | Admitting: Emergency Medicine

## 2019-10-31 ENCOUNTER — Emergency Department
Admission: EM | Admit: 2019-10-31 | Discharge: 2019-11-01 | Disposition: A | Discharge status: Home / Self Care | Payer: Medicaid Other | Attending: Emergency Medicine | Admitting: Emergency Medicine

## 2019-10-31 ENCOUNTER — Other Ambulatory Visit: Payer: Self-pay

## 2019-10-31 ENCOUNTER — Emergency Department: Payer: Medicaid Other

## 2019-10-31 DIAGNOSIS — Z79899 Other long term (current) drug therapy: Secondary | ICD-10-CM | POA: Insufficient documentation

## 2019-10-31 DIAGNOSIS — N23 Unspecified renal colic: Secondary | ICD-10-CM | POA: Insufficient documentation

## 2019-10-31 DIAGNOSIS — E119 Type 2 diabetes mellitus without complications: Secondary | ICD-10-CM | POA: Insufficient documentation

## 2019-10-31 DIAGNOSIS — R109 Unspecified abdominal pain: Secondary | ICD-10-CM | POA: Diagnosis not present

## 2019-10-31 DIAGNOSIS — F17210 Nicotine dependence, cigarettes, uncomplicated: Secondary | ICD-10-CM | POA: Diagnosis not present

## 2019-10-31 LAB — URINALYSIS, COMPLETE (UACMP) WITH MICROSCOPIC
Bacteria, UA: NONE SEEN
Bilirubin Urine: NEGATIVE
Glucose, UA: NEGATIVE mg/dL
Hgb urine dipstick: NEGATIVE
Ketones, ur: NEGATIVE mg/dL
Leukocytes,Ua: NEGATIVE
Nitrite: NEGATIVE
Protein, ur: NEGATIVE mg/dL
Specific Gravity, Urine: 1.026 (ref 1.005–1.030)
pH: 6 (ref 5.0–8.0)

## 2019-10-31 LAB — COMPREHENSIVE METABOLIC PANEL
ALT: 11 U/L (ref 0–44)
AST: 17 U/L (ref 15–41)
Albumin: 4 g/dL (ref 3.5–5.0)
Alkaline Phosphatase: 66 U/L (ref 38–126)
Anion gap: 7 (ref 5–15)
BUN: 20 mg/dL (ref 6–20)
CO2: 27 mmol/L (ref 22–32)
Calcium: 9.2 mg/dL (ref 8.9–10.3)
Chloride: 105 mmol/L (ref 98–111)
Creatinine, Ser: 0.98 mg/dL (ref 0.44–1.00)
GFR calc Af Amer: >60 mL/min (ref >60)
GFR calc non Af Amer: >60 mL/min (ref >60)
Glucose, Bld: 111 mg/dL — ABNORMAL HIGH (ref 70–99)
Potassium: 3.4 mmol/L — ABNORMAL LOW (ref 3.5–5.1)
Sodium: 139 mmol/L (ref 135–145)
Total Bilirubin: 0.4 mg/dL (ref 0.3–1.2)
Total Protein: 7.6 g/dL (ref 6.5–8.1)

## 2019-10-31 LAB — CBC
HCT: 36.4 % (ref 36.0–46.0)
Hemoglobin: 11.8 g/dL — ABNORMAL LOW (ref 12.0–15.0)
MCH: 27.3 pg (ref 26.0–34.0)
MCHC: 32.4 g/dL (ref 30.0–36.0)
MCV: 84.1 fL (ref 80.0–100.0)
Platelets: 294 10*3/uL (ref 150–400)
RBC: 4.33 MIL/uL (ref 3.87–5.11)
RDW: 14.8 % (ref 11.5–15.5)
WBC: 5.8 10*3/uL (ref 4.0–10.5)
nRBC: 0 % (ref 0.0–0.2)

## 2019-10-31 MED ORDER — FENTANYL CITRATE (PF) 100 MCG/2ML IJ SOLN
50.0000 ug | Freq: Once | INTRAMUSCULAR | Status: AC
  Administered 2019-11-01: 50 ug via INTRAVENOUS
  Filled 2019-10-31: qty 2

## 2019-10-31 MED ORDER — SODIUM CHLORIDE 0.9 % IV BOLUS
1000.0000 mL | Freq: Once | INTRAVENOUS | Status: AC
  Administered 2019-11-01: 1000 mL via INTRAVENOUS

## 2019-10-31 MED ORDER — ONDANSETRON HCL 4 MG/2ML IJ SOLN
4.0000 mg | Freq: Once | INTRAMUSCULAR | Status: AC
  Administered 2019-11-01: 4 mg via INTRAVENOUS
  Filled 2019-10-31: qty 2

## 2019-10-31 NOTE — ED Triage Notes (Signed)
Pt presents to ED with wrosening right sided flank pain since last friday. Dx with kidney stones 2/19. Pain increases when lying down.

## 2019-10-31 NOTE — ED Notes (Signed)
Pt states she has right flank pain; has had a kidney stone since 2/19 that has not passed.  She reports frequent urination; no blood in urine; no pain with urination.  Right flank pain is worse when bladder is full or when she lies down.  Pt rates pain 10/10 at this time.

## 2019-10-31 NOTE — ED Provider Notes (Signed)
[facility] Emergency Department Provider Note   ____________________________________________   First MD Initiated Contact with Patient 10/31/19 2336     (approximate)  I have reviewed the triage vital signs and the nursing notes.   HISTORY  Chief Complaint Flank Pain    HPI Kathe Becton Neighbors is a 49 y.o. female who returns to the ED from home with persistent right-sided flank pain.  Patient was seen in the ED and diagnosed with kidney stone 2/19.  Reports persistent pain since.  Has not followed up with urology.  Pain associated with nausea, no vomiting.  Denies fever, cough, chest pain, shortness of breath, dysuria, diarrhea.       Past Medical History:  Diagnosis Date  . Diabetes mellitus without complication (HCC)   . Hydradenitis   . Seizures [facility])     Patient Active Problem List   Diagnosis Date Noted  . Tobacco use disorder 03/13/2016  . Cannabis use disorder, moderate, dependence (HCC) 03/13/2016  . Bipolar affective disorder, manic, severe, with psychotic behavior (HCC) 03/12/2016  . Seizures (HCC) 03/11/2016  . Involuntary commitment 03/11/2016    History reviewed. No pertinent surgical history.  Prior to Admission medications   Medication Sig Start Date End Date Taking? Authorizing Provider  ciprofloxacin (CIPRO) 500 MG tablet Take 1 tablet (500 mg total) by mouth 2 (two) times daily. 03/16/19   Irean Hong, MD  cyclobenzaprine (FLEXERIL) 5 MG tablet Take 1-2 tablets 3 times daily as needed 09/28/18   Enid Derry, PA-C  dicyclomine (BENTYL) 20 MG tablet Take 1 tablet (20 mg total) by mouth 3 (three) times daily as needed for spasms. 09/10/19 09/09/20  Darci Current, MD  doxylamine, Sleep, (UNISOM) 25 MG tablet Take 50 mg by mouth at bedtime as needed.    [provider]  famotidine (PEPCID) 40 MG tablet Take 1 tablet (40 mg total) by mouth every evening. 09/27/17 09/27/18  Myrna Blazer, MD  famotidine (PEPCID) 40  MG tablet Take 1 tablet (40 mg total) by mouth every evening. 12/25/17 12/25/18  Rebecka Apley, MD  hydrOXYzine (ATARAX/VISTARIL) 50 MG tablet Take 1 tablet (50 mg total) by mouth every 6 (six) hours as needed for anxiety. Patient not taking: Reported on 07/23/2017 03/29/16   Pucilowska, Ellin Goodie, MD  lactulose (CHRONULAC) 10 GM/15ML solution Take 30 mLs (20 g total) by mouth daily as needed for mild constipation. 03/16/19   Irean Hong, MD  lidocaine (LIDODERM) 5 % Place 1 patch onto the skin daily. Remove & Discard patch within 12 hours or as directed by MD 09/28/18   Enid Derry, PA-C  methocarbamol (ROBAXIN) 500 MG tablet Take 1 tablet (500 mg total) by mouth 4 (four) times daily. 11/18/17   Cuthriell, Delorise Royals, PA-C  metoCLOPramide (REGLAN) 10 MG tablet Take 1 tablet (10 mg total) by mouth every 8 (eight) hours as needed. 12/25/17   Rebecka Apley, MD  ondansetron (ZOFRAN ODT) 4 MG disintegrating tablet Take 1 tablet (4 mg total) by mouth every 8 (eight) hours as needed for nausea or vomiting. 03/16/19   Irean Hong, MD  oxyCODONE-acetaminophen (PERCOCET/ROXICET) 5-325 MG tablet Take 1 tablet by mouth every 4 (four) hours as needed for severe pain. 11/01/19   Irean Hong, MD  phenazopyridine (PYRIDIUM) 200 MG tablet Take 1 tablet (200 mg total) by mouth 3 (three) times daily as needed for pain. Patient not taking: Reported on 07/23/2017 05/19/16   Menshew, Charlesetta Ivory, PA-C  phenytoin (DILANTIN) 100 MG ER capsule Take 1 capsule (100 mg total) by mouth 3 (three) times daily. Patient not taking: Reported on 07/23/2017 03/29/16   Clovis Fredrickson, MD  predniSONE (DELTASONE) 50 MG tablet Take 1 tab per day 09/29/18   Laban Emperor, PA-C  QUEtiapine (SEROQUEL) 400 MG tablet Take 2 tablets (800 mg total) by mouth at bedtime. Patient not taking: Reported on 07/23/2017 03/29/16   Pucilowska, Herma Ard B, MD  sucralfate (CARAFATE) 1 g tablet Take 1 tablet (1 g total) by mouth 2 (two) times daily.  12/25/17   Loney Hering, MD  traMADol (ULTRAM) 50 MG tablet Take 1 tablet (50 mg total) by mouth every 6 (six) hours as needed. 06/30/19 06/29/20  Gregor Hams, MD    Allergies Citrus, Sulfamethoxazole-trimethoprim, Butorphanol, Butorphanol tartrate, Contrast media [iodinated diagnostic agents], Food, Ibuprofen, Ketorolac, Lorazepam, Naproxen, Prochlorperazine, and Nsaids  No family history on file.  Social History Social History   Tobacco Use  . Smoking status: Current Every Day Smoker    Packs/day: 0.50    Types: Cigarettes  . Smokeless tobacco: Never Used  Substance Use Topics  . Alcohol use: No  . Drug use: No    Review of Systems  Constitutional: No fever/chills Eyes: No visual changes. ENT: No sore throat. Cardiovascular: Denies chest pain. Respiratory: Denies shortness of breath. Gastrointestinal: Positive for right flank pain.  No abdominal pain.  Positive for nausea, no vomiting.  No diarrhea.  No constipation. Genitourinary: Negative for dysuria. Musculoskeletal: Negative for back pain. Skin: Negative for rash. Neurological: Negative for headaches, focal weakness or numbness.   ____________________________________________   PHYSICAL EXAM:  VITAL SIGNS: ED Triage Vitals [10/31/19 1907]  Enc Vitals Group     BP (!) 135/111     Pulse Rate 95     Resp 20     Temp 99 F (37.2 C)     Temp Source Oral     SpO2 100 %     Weight 181 lb (82.1 kg)     Height 5\' 5"  (1.651 m)     Head Circumference      Peak Flow      Pain Score 10     Pain Loc      Pain Edu?      Excl. in [place]?     Constitutional: Alert and oriented. Well appearing and in no acute distress. Eyes: Conjunctivae are normal. PERRL. EOMI. Head: Atraumatic. Nose: No congestion/rhinnorhea. Mouth/Throat: Mucous membranes are moist.  Oropharynx non-erythematous. Neck: No stridor.   Cardiovascular: Normal rate, regular rhythm. Grossly normal heart sounds.  Good peripheral  circulation. Respiratory: Normal respiratory effort.  No retractions. Lungs CTAB. Gastrointestinal: Soft and nontender. No distention. No abdominal bruits.  Mild right CVA tenderness. Musculoskeletal: No lower extremity tenderness nor edema.  No joint effusions. Neurologic:  Normal speech and language. No gross focal neurologic deficits are appreciated. No gait instability. Skin:  Skin is warm, dry and intact. No rash noted. Psychiatric: Mood and affect are normal. Speech and behavior are normal.  ____________________________________________   LABS (all labs ordered are listed, but only abnormal results are displayed)  Labs Reviewed  COMPREHENSIVE METABOLIC PANEL - Abnormal; Notable for the following components:      Result Value   Potassium 3.4 (*)    Glucose, Bld 111 (*)    All other components within normal limits  CBC - Abnormal; Notable for the following components:   Hemoglobin 11.8 (*)    All other components  within normal limits  URINALYSIS, COMPLETE (UACMP) WITH MICROSCOPIC - Abnormal; Notable for the following components:   Color, Urine YELLOW (*)    APPearance CLEAR (*)    All other components within normal limits  PREGNANCY, URINE   ____________________________________________  EKG  None ____________________________________________  RADIOLOGY  ED MD interpretation: No definitive calculi  Official radiology report(s): DG Abdomen 1 View  Result Date: 11/01/2019 CLINICAL DATA:  Right-sided flank pain for several days, subsequent encounter EXAM: ABDOMEN - 1 VIEW COMPARISON:  10/18/2019 CT FINDINGS: Scattered large and small bowel gas is noted. No abnormal mass or abnormal calcifications are seen. No free air is noted. Changes of prior tubal ligation are seen. Phlebolith is again noted within the right hemipelvis. The tiny punctate stone seen in the distal ureter is not well appreciated on today's exam. IMPRESSION: No definitive calculi are noted. Electronically Signed    By: Alcide Clever M.D.   On: 11/01/2019 00:00    ____________________________________________   PROCEDURES  Procedure(s) performed (including Critical Care):  Procedures   ____________________________________________   INITIAL IMPRESSION / ASSESSMENT AND PLAN / ED COURSE  As part of my medical decision making, I reviewed the following data within the electronic MEDICAL RECORD NUMBER Nursing notes reviewed and incorporated, Labs reviewed, Old chart reviewed, Radiograph reviewed, Notes from prior ED visits and [place] Controlled Substance Database     [name] Waldren was evaluated in Emergency Department on 11/01/2019 for the symptoms described in the history of present illness. She was evaluated in the context of the global COVID-19 pandemic, which necessitated consideration that the patient might be at risk for infection with the SARS-CoV-2 virus that causes COVID-19. Institutional protocols and algorithms that pertain to the evaluation of patients at risk for COVID-19 are in a state of rapid change based on information released by regulatory bodies including the CDC and federal and state organizations. These policies and algorithms were followed during the patient's care in the ED.    49 year old female who returns for flank pain secondary to kidney stone. Differential diagnosis includes, but is not limited to, ovarian cyst, ovarian torsion, acute appendicitis, diverticulitis, urinary tract infection/pyelonephritis, endometriosis, bowel obstruction, colitis, renal colic, gastroenteritis, hernia, fibroids, endometriosis, pregnancy related pain including ectopic pregnancy, etc.  Laboratory and urinalysis results unremarkable.  Will obtain KUB.  I personally reviewed patient's old records and see that the CT demonstrated punctate distal right ureteral stone.  Will initiate IV fluid resuscitation, IV analgesia and reassess.   Clinical Course as of Oct 31 598  Fri Nov 01, 2019  0129 Updated patient of  all test results.  Will prescribe limited quantity Percocet and strongly advised her to follow-up with urology.  Strict return precautions given.  Patient verbalizes understanding agrees with plan of care.   [JS]    Clinical Course User Index [JS] Irean Hong, MD     ____________________________________________   FINAL CLINICAL IMPRESSION(S) / ED DIAGNOSES  Final diagnoses:  Right flank pain  Ureteral colic     ED Discharge Orders         Ordered    oxyCODONE-acetaminophen (PERCOCET/ROXICET) 5-325 MG tablet  Every 4 hours PRN     11/01/19 0134           Note:  This document was prepared using Dragon voice recognition software and may include unintentional dictation errors.   Irean Hong, MD 11/01/19 0600

## 2019-11-01 LAB — POCT PREGNANCY, URINE: Preg Test, Ur: NEGATIVE

## 2019-11-01 LAB — PREGNANCY, URINE: Preg Test, Ur: NEGATIVE

## 2019-11-01 MED ORDER — OXYCODONE-ACETAMINOPHEN 5-325 MG PO TABS: 1.0000 | ORAL_TABLET | ORAL | 0 refills | Status: DC | PRN

## 2019-11-01 NOTE — ED Notes (Signed)
Patient discharged to home per MD order. Patient in stable condition, and deemed medically cleared by ED provider for discharge. Discharge instructions reviewed with patient/family using "Teach Back"; verbalized understanding of medication education and administration, and information about follow-up care. Denies further concerns. ° °

## 2019-11-01 NOTE — Discharge Instructions (Addendum)
You may take Percocet as needed for pain.  Drink plenty of fluids daily.  Take a stool softener while you are having to take the strong pain medicine.  Return to the ER for worsening symptoms, persistent vomiting, difficulty breathing or other concerns.

## 2019-11-06 ENCOUNTER — Ambulatory Visit: Payer: Self-pay | Admitting: Urology

## 2019-11-07 ENCOUNTER — Encounter: Payer: Self-pay | Admitting: Urology

## 2019-11-29 ENCOUNTER — Emergency Department
Admission: EM | Admit: 2019-11-29 | Discharge: 2019-11-29 | Disposition: A | Discharge status: Home / Self Care | Payer: Medicaid Other | Attending: Emergency Medicine | Admitting: Emergency Medicine

## 2019-11-29 ENCOUNTER — Emergency Department: Payer: Medicaid Other

## 2019-11-29 ENCOUNTER — Other Ambulatory Visit: Payer: Self-pay

## 2019-11-29 DIAGNOSIS — R112 Nausea with vomiting, unspecified: Secondary | ICD-10-CM | POA: Insufficient documentation

## 2019-11-29 DIAGNOSIS — K2900 Acute gastritis without bleeding: Secondary | ICD-10-CM | POA: Diagnosis not present

## 2019-11-29 DIAGNOSIS — R52 Pain, unspecified: Secondary | ICD-10-CM | POA: Diagnosis not present

## 2019-11-29 DIAGNOSIS — R1084 Generalized abdominal pain: Secondary | ICD-10-CM | POA: Diagnosis not present

## 2019-11-29 DIAGNOSIS — F17210 Nicotine dependence, cigarettes, uncomplicated: Secondary | ICD-10-CM | POA: Diagnosis not present

## 2019-11-29 DIAGNOSIS — Z79899 Other long term (current) drug therapy: Secondary | ICD-10-CM | POA: Insufficient documentation

## 2019-11-29 DIAGNOSIS — E119 Type 2 diabetes mellitus without complications: Secondary | ICD-10-CM | POA: Diagnosis not present

## 2019-11-29 DIAGNOSIS — R1013 Epigastric pain: Secondary | ICD-10-CM

## 2019-11-29 DIAGNOSIS — I10 Essential (primary) hypertension: Secondary | ICD-10-CM | POA: Diagnosis not present

## 2019-11-29 LAB — URINALYSIS, ROUTINE W REFLEX MICROSCOPIC
Bacteria, UA: NONE SEEN
Bilirubin Urine: NEGATIVE
Glucose, UA: NEGATIVE mg/dL
Hgb urine dipstick: NEGATIVE
Ketones, ur: NEGATIVE mg/dL
Nitrite: NEGATIVE
Protein, ur: NEGATIVE mg/dL
Specific Gravity, Urine: 1.017 (ref 1.005–1.030)
pH: 6 (ref 5.0–8.0)

## 2019-11-29 LAB — URINE DRUG SCREEN, QUALITATIVE (ARMC ONLY)
Amphetamines, Ur Screen: NOT DETECTED
Barbiturates, Ur Screen: NOT DETECTED
Benzodiazepine, Ur Scrn: NOT DETECTED
Cannabinoid 50 Ng, Ur ~~LOC~~: NOT DETECTED
Cocaine Metabolite,Ur ~~LOC~~: NOT DETECTED
MDMA (Ecstasy)Ur Screen: NOT DETECTED
Methadone Scn, Ur: NOT DETECTED
Opiate, Ur Screen: NOT DETECTED
Phencyclidine (PCP) Ur S: NOT DETECTED
Tricyclic, Ur Screen: NOT DETECTED

## 2019-11-29 LAB — COMPREHENSIVE METABOLIC PANEL
ALT: 10 U/L (ref 0–44)
AST: 15 U/L (ref 15–41)
Albumin: 3.8 g/dL (ref 3.5–5.0)
Alkaline Phosphatase: 57 U/L (ref 38–126)
Anion gap: 6 (ref 5–15)
BUN: 16 mg/dL (ref 6–20)
CO2: 28 mmol/L (ref 22–32)
Calcium: 9.4 mg/dL (ref 8.9–10.3)
Chloride: 108 mmol/L (ref 98–111)
Creatinine, Ser: 0.89 mg/dL (ref 0.44–1.00)
GFR calc Af Amer: >60 mL/min (ref >60)
GFR calc non Af Amer: >60 mL/min (ref >60)
Glucose, Bld: 106 mg/dL — ABNORMAL HIGH (ref 70–99)
Potassium: 3.5 mmol/L (ref 3.5–5.1)
Sodium: 142 mmol/L (ref 135–145)
Total Bilirubin: 0.5 mg/dL (ref 0.3–1.2)
Total Protein: 7.9 g/dL (ref 6.5–8.1)

## 2019-11-29 LAB — CBC WITH DIFFERENTIAL/PLATELET
Abs Immature Granulocytes: 0.02 10*3/uL (ref 0.00–0.07)
Basophils Absolute: 0 10*3/uL (ref 0.0–0.1)
Basophils Relative: 0 %
Eosinophils Absolute: 0.2 10*3/uL (ref 0.0–0.5)
Eosinophils Relative: 3 %
HCT: 36.7 % (ref 36.0–46.0)
Hemoglobin: 11.9 g/dL — ABNORMAL LOW (ref 12.0–15.0)
Immature Granulocytes: 0 %
Lymphocytes Relative: 49 %
Lymphs Abs: 2.2 10*3/uL (ref 0.7–4.0)
MCH: 27.4 pg (ref 26.0–34.0)
MCHC: 32.4 g/dL (ref 30.0–36.0)
MCV: 84.4 fL (ref 80.0–100.0)
Monocytes Absolute: 0.4 10*3/uL (ref 0.1–1.0)
Monocytes Relative: 8 %
Neutro Abs: 1.9 10*3/uL (ref 1.7–7.7)
Neutrophils Relative %: 40 %
Platelets: 324 10*3/uL (ref 150–400)
RBC: 4.35 MIL/uL (ref 3.87–5.11)
RDW: 14.8 % (ref 11.5–15.5)
WBC: 4.7 10*3/uL (ref 4.0–10.5)
nRBC: 0 % (ref 0.0–0.2)

## 2019-11-29 LAB — MAGNESIUM: Magnesium: 1.8 mg/dL (ref 1.7–2.4)

## 2019-11-29 LAB — ETHANOL: Alcohol, Ethyl (B): <10 mg/dL (ref <10)

## 2019-11-29 LAB — LIPASE, BLOOD: Lipase: 26 U/L (ref 11–51)

## 2019-11-29 LAB — POCT PREGNANCY, URINE: Preg Test, Ur: NEGATIVE

## 2019-11-29 MED ORDER — SUCRALFATE 1 G PO TABS: 1.0000 g | ORAL_TABLET | Freq: Four times a day (QID) | ORAL | 1 refills | Status: DC | PRN

## 2019-11-29 MED ORDER — ONDANSETRON 4 MG PO TBDP: ORAL_TABLET | ORAL | 0 refills | Status: DC

## 2019-11-29 MED ORDER — DROPERIDOL 2.5 MG/ML IJ SOLN
2.5000 mg | Freq: Once | INTRAMUSCULAR | Status: AC
  Administered 2019-11-29: 2.5 mg via INTRAVENOUS
  Filled 2019-11-29: qty 2

## 2019-11-29 MED ORDER — OMEPRAZOLE MAGNESIUM 20 MG PO TBEC: 20.0000 mg | DELAYED_RELEASE_TABLET | Freq: Every day | ORAL | 1 refills | Status: DC

## 2019-11-29 MED ORDER — PANTOPRAZOLE SODIUM 40 MG IV SOLR
40.0000 mg | Freq: Once | INTRAVENOUS | Status: AC
  Administered 2019-11-29: 40 mg via INTRAVENOUS
  Filled 2019-11-29: qty 40

## 2019-11-29 NOTE — ED Triage Notes (Addendum)
Pt arrives [facility] from home w cc of epigastric/abd pain and vomiting. Per EMS, on arrival they had daughter wake the pt up. Per pt pain is 10/10, travels up throat and burns when vomiting. 4 emesis occurences tonight. Pain started 2 days ago and pt states "this is different than my acid reflux, I've been having backaches".  Pt already took pepto and tums w no relief. No alcohol last 24h

## 2019-11-29 NOTE — ED Notes (Signed)
US at bedside

## 2019-11-29 NOTE — ED Provider Notes (Signed)
[facility] Emergency Department Provider Note  ____________________________________________   First MD Initiated Contact with Patient 11/29/19 [phone]     (approximate)  I have reviewed the triage vital signs and the nursing notes.   HISTORY  Chief Complaint Abdominal Pain    HPI Karina Anderson is a 49 y.o. female with medical history as listed below who presents by EMS for evaluation of about 2 days of burning severe upper abdominal/epigastric pain associated with nausea and multiple episodes of vomiting.  She said that it occurred after going out to dinner about 2 nights ago, nothing unusual with the dinner and no alcohol consumption.  However shortly after eating she developed pain described above and lying down seems to make it worse and causes it to bubble up into her throat.  She said that she is used to feeling like this from time to time due to reflux but this feels worse and has been persistent.  She has no history of gallbladder disease.  She has had no fever, chest pain, cough, nor shortness of breath and no dysuria.  She does report some pain in her lower back as well.  I did not receive the report personally, but reportedly EMS reported that the patient was asleep when they arrived and her daughter had to wake her up to be transported by EMS.  She is awake and alert for me and playing a game on her cell phone.         Past Medical History:  Diagnosis Date  . Diabetes mellitus without complication (HCC)   . Hydradenitis   . Seizures [facility]HCC)     Patient Active Problem List   Diagnosis Date Noted  . Tobacco use disorder 03/13/2016  . Cannabis use disorder, moderate, dependence (HCC) 03/13/2016  . Bipolar affective disorder, manic, severe, with psychotic behavior (HCC) 03/12/2016  . Seizures (HCC) 03/11/2016  . Involuntary commitment 03/11/2016    History reviewed. No pertinent surgical history.  Prior to Admission medications   Medication  Sig Start Date End Date Taking? Authorizing Provider  ciprofloxacin (CIPRO) 500 MG tablet Take 1 tablet (500 mg total) by mouth 2 (two) times daily. 03/16/19   Irean HongSung, Jade J, MD  cyclobenzaprine (FLEXERIL) 5 MG tablet Take 1-2 tablets 3 times daily as needed 09/28/18   Enid DerryWagner, Ashley, PA-C  dicyclomine (BENTYL) 20 MG tablet Take 1 tablet (20 mg total) by mouth 3 (three) times daily as needed for spasms. 09/10/19 09/09/20  Darci CurrentBrown, Courtland N, MD  doxylamine, Sleep, (UNISOM) 25 MG tablet Take 50 mg by mouth at bedtime as needed.    [provider]  famotidine (PEPCID) 40 MG tablet Take 1 tablet (40 mg total) by mouth every evening. 09/27/17 09/27/18  Myrna BlazerSchaevitz, David Matthew, MD  famotidine (PEPCID) 40 MG tablet Take 1 tablet (40 mg total) by mouth every evening. 12/25/17 12/25/18  Rebecka ApleyWebster, Allison P, MD  hydrOXYzine (ATARAX/VISTARIL) 50 MG tablet Take 1 tablet (50 mg total) by mouth every 6 (six) hours as needed for anxiety. Patient not taking: Reported on 07/23/2017 03/29/16   Pucilowska, Ellin GoodieJolanta B, MD  lactulose (CHRONULAC) 10 GM/15ML solution Take 30 mLs (20 g total) by mouth daily as needed for mild constipation. 03/16/19   Irean HongSung, Jade J, MD  lidocaine (LIDODERM) 5 % Place 1 patch onto the skin daily. Remove & Discard patch within 12 hours or as directed by MD 09/28/18   Enid DerryWagner, Ashley, PA-C  methocarbamol (ROBAXIN) 500 MG tablet Take 1 tablet (500 mg  total) by mouth 4 (four) times daily. 11/18/17   Cuthriell, Delorise Royals, PA-C  metoCLOPramide (REGLAN) 10 MG tablet Take 1 tablet (10 mg total) by mouth every 8 (eight) hours as needed. 12/25/17   Rebecka Apley, MD  omeprazole (PRILOSEC OTC) 20 MG tablet Take 1 tablet (20 mg total) by mouth daily. 11/29/19 11/28/20  Loleta Rose, MD  ondansetron (ZOFRAN ODT) 4 MG disintegrating tablet Allow 1-2 tablets to dissolve in your mouth every 8 hours as needed for nausea/vomiting 11/29/19   Loleta Rose, MD  oxyCODONE-acetaminophen (PERCOCET/ROXICET) 5-325 MG tablet  Take 1 tablet by mouth every 4 (four) hours as needed for severe pain. 11/01/19   Irean Hong, MD  phenazopyridine (PYRIDIUM) 200 MG tablet Take 1 tablet (200 mg total) by mouth 3 (three) times daily as needed for pain. Patient not taking: Reported on 07/23/2017 05/19/16   Menshew, Charlesetta Ivory, PA-C  phenytoin (DILANTIN) 100 MG ER capsule Take 1 capsule (100 mg total) by mouth 3 (three) times daily. Patient not taking: Reported on 07/23/2017 03/29/16   Shari Prows, MD  predniSONE (DELTASONE) 50 MG tablet Take 1 tab per day 09/29/18   Enid Derry, PA-C  QUEtiapine (SEROQUEL) 400 MG tablet Take 2 tablets (800 mg total) by mouth at bedtime. Patient not taking: Reported on 07/23/2017 03/29/16   Pucilowska, Ellin Goodie, MD  sucralfate (CARAFATE) 1 g tablet Take 1 tablet (1 g total) by mouth 4 (four) times daily as needed (for abdominal discomfort, nausea, and/or vomiting). 11/29/19   Loleta Rose, MD  traMADol (ULTRAM) 50 MG tablet Take 1 tablet (50 mg total) by mouth every 6 (six) hours as needed. 06/30/19 06/29/20  Darci Current, MD    Allergies Citrus, Sulfamethoxazole-trimethoprim, Butorphanol, Butorphanol tartrate, Contrast media [iodinated diagnostic agents], Food, Ibuprofen, Ketorolac, Lorazepam, Naproxen, Prochlorperazine, and Nsaids  No family history on file.  Social History Social History   Tobacco Use  . Smoking status: Current Every Day Smoker    Packs/day: 0.50    Types: Cigarettes  . Smokeless tobacco: Never Used  Substance Use Topics  . Alcohol use: No  . Drug use: No    Review of Systems Constitutional: No fever/chills Eyes: No visual changes. ENT: No sore throat. Cardiovascular: Denies chest pain. Respiratory: Denies shortness of breath. Gastrointestinal: Abdominal pain with persistent nausea and vomiting for 2 days. Genitourinary: Negative for dysuria. Musculoskeletal: Negative for neck pain.  Negative for back pain. Integumentary: Negative for  rash. Neurological: Negative for headaches, focal weakness or numbness.   ____________________________________________   PHYSICAL EXAM:  ED Triage Vitals  Enc Vitals Group     BP 11/29/19 0447 112/85     Pulse Rate 11/29/19 0447 84     Resp 11/29/19 0447 18     Temp 11/29/19 0447 98.8 F (37.1 C)     Temp Source 11/29/19 0447 Oral     SpO2 11/29/19 0447 99 %     Weight 11/29/19 0448 84.8 kg (187 lb)     Height 11/29/19 0448 1.651 m (5\' 5" )     Head Circumference --      Peak Flow --      Pain Score 11/29/19 0448 10     Pain Loc --      Pain Edu? --      Excl. in [place]? --      Constitutional: Alert and oriented.  In no distress at this time. Eyes: Conjunctivae are normal.  Head: Atraumatic. Nose: No congestion/rhinnorhea. Mouth/Throat: Patient  is wearing a mask. Neck: No stridor.  No meningeal signs.   Cardiovascular: Normal rate, regular rhythm. Good peripheral circulation. Grossly normal heart sounds. Respiratory: Normal respiratory effort.  No retractions. Gastrointestinal: Soft and nondistended.  Epigastric and right upper quadrant tenderness with equivocal Murphy sign.  No lower abdominal tenderness, no localized peritonitis. Musculoskeletal: No lower extremity tenderness nor edema. No gross deformities of extremities. Neurologic:  Normal speech and language. No gross focal neurologic deficits are appreciated.  Skin:  Skin is warm, dry and intact. Psychiatric: Mood and affect are normal. Speech and behavior are normal.  ____________________________________________   LABS (all labs ordered are listed, but only abnormal results are displayed)  Labs Reviewed  CBC WITH DIFFERENTIAL/PLATELET - Abnormal; Notable for the following components:      Result Value   Hemoglobin 11.9 (*)    All other components within normal limits  COMPREHENSIVE METABOLIC PANEL - Abnormal; Notable for the following components:   Glucose, Bld 106 (*)    All other components within normal  limits  URINALYSIS, ROUTINE W REFLEX MICROSCOPIC - Abnormal; Notable for the following components:   Color, Urine YELLOW (*)    APPearance CLEAR (*)    Leukocytes,Ua TRACE (*)    All other components within normal limits  MAGNESIUM  LIPASE, BLOOD  URINE DRUG SCREEN, QUALITATIVE ([facility] ONLY)  ETHANOL  POC URINE PREG, ED  POCT PREGNANCY, URINE   ____________________________________________  EKG  ED ECG REPORT I, Loleta Rose, the attending physician, personally viewed and interpreted this ECG.  Date: 11/29/2019 EKG Time: 4:44 AM Rate: 83 Rhythm: normal sinus rhythm QRS Axis: normal Intervals: normal ST/T Wave abnormalities: Non-specific ST segment / T-wave changes, but no clear evidence of acute ischemia. Narrative Interpretation: no definitive evidence of acute ischemia; does not meet STEMI criteria.  ____________________________________________  RADIOLOGY I, Loleta Rose, personally viewed and evaluated these images (plain radiographs) as part of my medical decision making, as well as reviewing the written report by the radiologist.  ED MD interpretation:  No acute abnormalities identified  Official radiology report(s): US ABDOMEN LIMITED RUQ  Result Date: 11/29/2019 CLINICAL DATA:  49 year old female with history of epigastric pain radiating to the back with nausea and vomiting for the past 2 days. EXAM: ULTRASOUND ABDOMEN LIMITED RIGHT UPPER QUADRANT COMPARISON:  Abdominal ultrasound 12/25/2017. FINDINGS: Gallbladder: No gallstones or wall thickening visualized. No sonographic Murphy sign noted by sonographer. Common bile duct: Diameter: 3.0 mm. Liver: No focal lesion identified. Within normal limits in parenchymal echogenicity. Portal vein is patent on color Doppler imaging with normal direction of blood flow towards the liver. Other: None. IMPRESSION: 1. No acute findings. Specifically, no cholelithiasis and no evidence of acute cholecystitis. Electronically Signed   By:  Trudie Reed M.D.   On: 11/29/2019 05:46    ____________________________________________   PROCEDURES   Procedure(s) performed (including Critical Care):  Procedures   ____________________________________________   INITIAL IMPRESSION / MDM / ASSESSMENT AND PLAN / ED COURSE  As part of my medical decision making, I reviewed the following data within the electronic MEDICAL RECORD NUMBER Nursing notes reviewed and incorporated, Labs reviewed , EKG interpreted , Old chart reviewed, Radiograph reviewed , Notes from prior ED visits and [place] Controlled Substance Database   Differential diagnosis includes, but is not limited to, acid reflux/gastritis, biliary disease (biliary colic, cholecystitis, choledocholithiasis, etc.), cannabinoid hyperemesis syndrome.  Patient is generally well-appearing and in no distress at this time although she reports the persistent nausea and burning epigastric pain.  Vital signs are stable and within normal limits with no tachycardia and no fever.  She denies alcohol use but could have other nonspecific causes of gastritis.  Given her persistent nausea and vomiting over the last several days as well as the documented history of cannabinoid dependence, I will treat with droperidol 2.5 mg IV for symptomatic improvement and will evaluate with standard labs and an ultrasound of the right upper quadrant.  No indication that the patient needs fluids at this time.  Urinalysis pending as well.      Clinical Course as of Nov 29 647  Fri Nov 29, 2019  1856 The patient is much more comfortable and is lying in a right lateral decubitus position in no distress.  She is having no more nausea or vomiting.  She says that she feels well.  I updated her about all the results and explained about nonspecific gastritis.  I encouraged her to stick with a bland diet and follow-up with GI as well as to try the medications as prescribed.  She said that she understands.  I gave my usual and  customary return precautions and she understands and agrees with the plan.   [CF]  0517 Preg Test, Ur: NEGATIVE [CF]  0517 WBC: 4.7 [CF]  0549 No evidence of cholelithiasis nor cholecystitis.  US ABDOMEN LIMITED RUQ [CF]  0549 Reassuring lab work with no acute abnormalities identified.   [CF]  0612 In the absence of dysuria, I think that the WBCs seen in this urinalysis are more likely secondary to contamination and squamous epithelials rather than active infection.  I will not treat empirically.  Urinalysis, Routine w reflex microscopic(!) [CF]  [id number] Normal UDS with no positive results.  Urine Drug Screen, Qualitative ([facility] only) [CF]  [id number] Of note, I am also giving her a dose of pantoprazole 40 mg IV prior to discharge.  She said that she does not take a PPI regularly so I gave her a prescription for Prilosec OTC in addition to the Carafate and Zofran prescriptions.   [CF]    Clinical Course User Index [CF] Loleta Rose, MD     ____________________________________________  FINAL CLINICAL IMPRESSION(S) / ED DIAGNOSES  Final diagnoses:  Epigastric pain  Acute gastritis without hemorrhage, unspecified gastritis type  Nausea and vomiting, intractability of vomiting not specified, unspecified vomiting type     MEDICATIONS GIVEN DURING THIS VISIT:  Medications  pantoprazole (PROTONIX) injection 40 mg (has no administration in time range)  droperidol (INAPSINE) 2.5 MG/ML injection 2.5 mg (2.5 mg Intravenous Given 11/29/19 0504)     ED Discharge Orders         Ordered    omeprazole (PRILOSEC OTC) 20 MG tablet  Daily     11/29/19 0641    ondansetron (ZOFRAN ODT) 4 MG disintegrating tablet     11/29/19 0641    sucralfate (CARAFATE) 1 g tablet  4 times daily PRN     11/29/19 0641          *Please note:  Karina Anderson was evaluated in Emergency Department on 11/29/2019 for the symptoms described in the history of present illness. She was evaluated in the context of the global  COVID-19 pandemic, which necessitated consideration that the patient might be at risk for infection with the SARS-CoV-2 virus that causes COVID-19. Institutional protocols and algorithms that pertain to the evaluation of patients at risk for COVID-19 are in a state of rapid change based on information released by regulatory bodies including the CDC  and federal and state organizations. These policies and algorithms were followed during the patient's care in the ED.  Some ED evaluations and interventions may be delayed as a result of limited staffing during the pandemic.*  Note:  This document was prepared using Dragon voice recognition software and may include unintentional dictation errors.   Hinda Kehr, MD 11/29/19 [phone]

## 2019-11-29 NOTE — Discharge Instructions (Signed)
We believe your symptoms are a result of GERD (acid reflux) which has resulted in a condition called gastritis, or the inflammation and irrigation of your stomach and possibly your esophagus.  Please read through the included information and follow up with your regular doctor.  In the meantime, we encourage you to try an over-the-counter medication such as Prilosec OTC.  Give it at least a week at see if your symptoms improve.  Return to the Emergency Department with new or worsening symptoms that concern you.

## 2019-12-11 ENCOUNTER — Emergency Department
Admission: EM | Admit: 2019-12-11 | Discharge: 2019-12-11 | Disposition: A | Discharge status: Home / Self Care | Payer: Medicaid Other | Attending: Emergency Medicine | Admitting: Emergency Medicine

## 2019-12-11 ENCOUNTER — Emergency Department: Payer: Medicaid Other

## 2019-12-11 ENCOUNTER — Other Ambulatory Visit: Payer: Self-pay

## 2019-12-11 DIAGNOSIS — F17210 Nicotine dependence, cigarettes, uncomplicated: Secondary | ICD-10-CM | POA: Diagnosis not present

## 2019-12-11 DIAGNOSIS — M545 Low back pain: Secondary | ICD-10-CM | POA: Insufficient documentation

## 2019-12-11 DIAGNOSIS — M25562 Pain in left knee: Secondary | ICD-10-CM | POA: Insufficient documentation

## 2019-12-11 DIAGNOSIS — Z79899 Other long term (current) drug therapy: Secondary | ICD-10-CM | POA: Insufficient documentation

## 2019-12-11 DIAGNOSIS — E119 Type 2 diabetes mellitus without complications: Secondary | ICD-10-CM | POA: Diagnosis not present

## 2019-12-11 DIAGNOSIS — Z7984 Long term (current) use of oral hypoglycemic drugs: Secondary | ICD-10-CM | POA: Insufficient documentation

## 2019-12-11 DIAGNOSIS — M7989 Other specified soft tissue disorders: Secondary | ICD-10-CM | POA: Diagnosis not present

## 2019-12-11 MED ORDER — OXYCODONE-ACETAMINOPHEN 5-325 MG PO TABS: 1.0000 | ORAL_TABLET | ORAL | 0 refills | Status: DC | PRN

## 2019-12-11 MED ORDER — OXYCODONE-ACETAMINOPHEN 5-325 MG PO TABS
1.0000 | ORAL_TABLET | Freq: Once | ORAL | Status: AC
  Administered 2019-12-11: 1 via ORAL
  Filled 2019-12-11: qty 1

## 2019-12-11 MED ORDER — PREDNISONE 10 MG (21) PO TBPK: ORAL_TABLET | ORAL | 0 refills | Status: DC

## 2019-12-11 NOTE — ED Triage Notes (Signed)
First Nurse Note:  C/O pain and swelling behind left knee and left lower leg x 1 week.  Patient is AAOx3.  Skin warm and dry. NAD

## 2019-12-11 NOTE — ED Triage Notes (Signed)
Pt comes POV with left knee pain and swelling since last week. No injury to note. Swelling present to back of knee. No hx of blood clots or thinners.

## 2019-12-11 NOTE — ED Provider Notes (Signed)
[facility] Emergency Department Provider Note  ____________________________________________   First MD Initiated Contact with Patient 12/11/19 1134     (approximate)  I have reviewed the triage vital signs and the nursing notes.   HISTORY  Chief Complaint Knee Pain    HPI Karina Anderson is a 49 y.o. female presents emergency department complaining of left knee and lower back pain.  Patient states that the left knee is swollen and tender.  Denies fall.  Able to walk even though it is painful.  No numbness or tingling.  No lower leg tenderness.    Past Medical History:  Diagnosis Date  . Diabetes mellitus without complication (HCC)   . Hydradenitis   . Seizures [facility])     Patient Active Problem List   Diagnosis Date Noted  . Tobacco use disorder 03/13/2016  . Cannabis use disorder, moderate, dependence (HCC) 03/13/2016  . Bipolar affective disorder, manic, severe, with psychotic behavior (HCC) 03/12/2016  . Seizures (HCC) 03/11/2016  . Involuntary commitment 03/11/2016    History reviewed. No pertinent surgical history.  Prior to Admission medications   Medication Sig Start Date End Date Taking? Authorizing Provider  cyclobenzaprine (FLEXERIL) 5 MG tablet Take 1-2 tablets 3 times daily as needed 09/28/18   Enid Derry, PA-C  doxylamine, Sleep, (UNISOM) 25 MG tablet Take 50 mg by mouth at bedtime as needed.    [provider]  famotidine (PEPCID) 40 MG tablet Take 1 tablet (40 mg total) by mouth every evening. 09/27/17 09/27/18  Myrna Blazer, MD  famotidine (PEPCID) 40 MG tablet Take 1 tablet (40 mg total) by mouth every evening. 12/25/17 12/25/18  Rebecka Apley, MD  lactulose (CHRONULAC) 10 GM/15ML solution Take 30 mLs (20 g total) by mouth daily as needed for mild constipation. 03/16/19   Irean Hong, MD  omeprazole (PRILOSEC OTC) 20 MG tablet Take 1 tablet (20 mg total) by mouth daily. 11/29/19 11/28/20  Loleta Rose, MD   ondansetron (ZOFRAN ODT) 4 MG disintegrating tablet Allow 1-2 tablets to dissolve in your mouth every 8 hours as needed for nausea/vomiting 11/29/19   Loleta Rose, MD  oxyCODONE-acetaminophen (PERCOCET) 5-325 MG tablet Take 1 tablet by mouth every 4 (four) hours as needed for severe pain. 12/11/19 12/10/20  , Roselyn Bering, PA-C  predniSONE (STERAPRED UNI-PAK 21 TAB) 10 MG (21) TBPK tablet Take 6 pills on day one then decrease by 1 pill each day 12/11/19   Faythe Ghee, PA-C  QUEtiapine (SEROQUEL) 400 MG tablet Take 2 tablets (800 mg total) by mouth at bedtime. Patient not taking: Reported on 07/23/2017 03/29/16   Pucilowska, Ellin Goodie, MD  sucralfate (CARAFATE) 1 g tablet Take 1 tablet (1 g total) by mouth 4 (four) times daily as needed (for abdominal discomfort, nausea, and/or vomiting). 11/29/19   Loleta Rose, MD    Allergies Citrus, Sulfamethoxazole-trimethoprim, Butorphanol, Butorphanol tartrate, Contrast media [iodinated diagnostic agents], Food, Ibuprofen, Ketorolac, Lorazepam, Naproxen, Prochlorperazine, and Nsaids  History reviewed. No pertinent family history.  Social History Social History   Tobacco Use  . Smoking status: Current Every Day Smoker    Packs/day: 0.50    Types: Cigarettes  . Smokeless tobacco: Never Used  Substance Use Topics  . Alcohol use: No  . Drug use: No    Review of Systems  Constitutional: No fever/chills Eyes: No visual changes. ENT: No sore throat. Respiratory: Denies cough Cardiovascular: Denies chest pain Gastrointestinal: Denies abdominal pain Genitourinary: Negative for dysuria. Musculoskeletal: Positive for back  pain.  Positive for left knee pain Skin: Negative for rash. Psychiatric: no mood changes,     ____________________________________________   PHYSICAL EXAM:  VITAL SIGNS: ED Triage Vitals  Enc Vitals Group     BP 12/11/19 1125 (!) 131/94     Pulse Rate 12/11/19 1125 (!) 102     Resp 12/11/19 1125 18     Temp 12/11/19  1125 98.3 F (36.8 C)     Temp Source 12/11/19 1125 Oral     SpO2 12/11/19 1125 98 %     Weight 12/11/19 1127 187 lb (84.8 kg)     Height 12/11/19 1127 5\' 5"  (1.651 m)     Head Circumference --      Peak Flow --      Pain Score 12/11/19 1127 10     Pain Loc --      Pain Edu? --      Excl. in [place]? --     Constitutional: Alert and oriented. Well appearing and in no acute distress. Eyes: Conjunctivae are normal.  Head: Atraumatic. Nose: No congestion/rhinnorhea. Mouth/Throat: Mucous membranes are moist.   Neck:  supple no lymphadenopathy noted Cardiovascular: Normal rate, regular rhythm. Heart sounds are normal Respiratory: Normal respiratory effort.  No retractions, lungs c t a  Abd: soft nontender bs normal all 4 quad GU: deferred Musculoskeletal: FROM all extremities, warm and well perfused, large amount of swelling noted around the left knee, most likely an effusion, calf is not tender, negative Homans' sign, neurovascular is intact Neurologic:  Normal speech and language.  Skin:  Skin is warm, dry and intact. No rash noted. Psychiatric: Mood and affect are normal. Speech and behavior are normal.  ____________________________________________   LABS (all labs ordered are listed, but only abnormal results are displayed)  Labs Reviewed - No data to display ____________________________________________   ____________________________________________  RADIOLOGY  X-ray of the left knee is negative  ____________________________________________   PROCEDURES  Procedure(s) performed: Knee immobilizer applied by nursing staff   Procedures    ____________________________________________   INITIAL IMPRESSION / ASSESSMENT AND PLAN / ED COURSE  Pertinent labs & imaging results that were available during my care of the patient were reviewed by me and considered in my medical decision making (see chart for details).   Patient is a 49 year old female presents emergency  department with concerns of left knee pain.  Some lower back pain.  Physical exam left knee is swollen and tender at the joint line and suprapatellar bursa, negative Homans' sign, no calf tenderness.  Patient was able to ambulate to the cafeteria and back without difficulty.  X-ray of the left knee  X-ray of the left knee is negative for any acute abnormality.  I did explain the findings to the patient.  I feel that this is more of a bursitis/minimal effusion.  She is to follow-up with orthopedics.  She is placed in a knee immobilizer and given ice pack.  Percocet while here in the ED.  She was given 8 Percocet pills and a prescription.  Sterapred to decrease swelling.  She states she understands will comply.  She discharged stable condition.   [place] was evaluated in Emergency Department on 12/11/2019 for the symptoms described in the history of present illness. She was evaluated in the context of the global COVID-19 pandemic, which necessitated consideration that the patient might be at risk for infection with the SARS-CoV-2 virus that causes COVID-19. Institutional protocols and algorithms that pertain to the evaluation of  patients at risk for COVID-19 are in a state of rapid change based on information released by regulatory bodies including the CDC and federal and state organizations. These policies and algorithms were followed during the patient's care in the ED.   As part of my medical decision making, I reviewed the following data within the electronic MEDICAL RECORD NUMBER Nursing notes reviewed and incorporated, Old chart reviewed, Radiograph reviewed , Notes from prior ED visits and [place] Controlled Substance Database  ____________________________________________   FINAL CLINICAL IMPRESSION(S) / ED DIAGNOSES  Final diagnoses:  Acute pain of left knee      NEW MEDICATIONS STARTED DURING THIS VISIT:  New Prescriptions   OXYCODONE-ACETAMINOPHEN (PERCOCET) 5-325 MG TABLET    Take 1  tablet by mouth every 4 (four) hours as needed for severe pain.   PREDNISONE (STERAPRED UNI-PAK 21 TAB) 10 MG (21) TBPK TABLET    Take 6 pills on day one then decrease by 1 pill each day     Note:  This document was prepared using Dragon voice recognition software and may include unintentional dictation errors.    Faythe Ghee, PA-C 12/11/19 1323    Concha Se, MD 12/11/19 1400

## 2019-12-11 NOTE — Discharge Instructions (Addendum)
Follow-up with [facility] clinic orthopedics.  Please call for an appointment.  Take the steroid and pain medication as prescribed.  Return emergency department if worsening.  Knee immobilizer and ice to prevent swelling

## 2019-12-11 NOTE — ED Notes (Signed)
See triage note  Presents with left knee and lower back pain  States she did not fall  But states her knee gave out  Ambulates with slight limp d/t pain

## 2020-01-22 DIAGNOSIS — M898X4 Other specified disorders of bone, hand: Secondary | ICD-10-CM | POA: Diagnosis not present

## 2020-01-22 DIAGNOSIS — Z79899 Other long term (current) drug therapy: Secondary | ICD-10-CM | POA: Diagnosis not present

## 2020-01-22 DIAGNOSIS — M899 Disorder of bone, unspecified: Secondary | ICD-10-CM | POA: Diagnosis not present

## 2020-01-22 DIAGNOSIS — Z87891 Personal history of nicotine dependence: Secondary | ICD-10-CM | POA: Diagnosis not present

## 2020-02-04 DIAGNOSIS — G5602 Carpal tunnel syndrome, left upper limb: Secondary | ICD-10-CM | POA: Diagnosis not present

## 2020-02-12 DIAGNOSIS — G5602 Carpal tunnel syndrome, left upper limb: Secondary | ICD-10-CM | POA: Diagnosis not present

## 2020-02-12 DIAGNOSIS — R200 Anesthesia of skin: Secondary | ICD-10-CM | POA: Diagnosis not present

## 2020-02-12 DIAGNOSIS — R202 Paresthesia of skin: Secondary | ICD-10-CM | POA: Diagnosis not present

## 2020-02-24 ENCOUNTER — Encounter: Payer: Self-pay | Admitting: Emergency Medicine

## 2020-02-24 ENCOUNTER — Other Ambulatory Visit: Payer: Self-pay

## 2020-02-24 ENCOUNTER — Emergency Department
Admission: EM | Admit: 2020-02-24 | Discharge: 2020-02-24 | Disposition: A | Discharge status: Home / Self Care | Payer: Medicaid Other | Attending: Emergency Medicine | Admitting: Emergency Medicine

## 2020-02-24 DIAGNOSIS — E161 Other hypoglycemia: Secondary | ICD-10-CM | POA: Diagnosis not present

## 2020-02-24 DIAGNOSIS — R072 Precordial pain: Secondary | ICD-10-CM | POA: Diagnosis not present

## 2020-02-24 DIAGNOSIS — I10 Essential (primary) hypertension: Secondary | ICD-10-CM | POA: Diagnosis not present

## 2020-02-24 DIAGNOSIS — Z5321 Procedure and treatment not carried out due to patient leaving prior to being seen by health care provider: Secondary | ICD-10-CM | POA: Diagnosis not present

## 2020-02-24 DIAGNOSIS — R079 Chest pain, unspecified: Secondary | ICD-10-CM | POA: Diagnosis not present

## 2020-02-24 DIAGNOSIS — R0789 Other chest pain: Secondary | ICD-10-CM | POA: Diagnosis not present

## 2020-02-24 DIAGNOSIS — E162 Hypoglycemia, unspecified: Secondary | ICD-10-CM | POA: Diagnosis not present

## 2020-02-24 LAB — CBC
HCT: 34.8 % — ABNORMAL LOW (ref 36.0–46.0)
Hemoglobin: 11.5 g/dL — ABNORMAL LOW (ref 12.0–15.0)
MCH: 27.3 pg (ref 26.0–34.0)
MCHC: 33 g/dL (ref 30.0–36.0)
MCV: 82.5 fL (ref 80.0–100.0)
Platelets: 294 10*3/uL (ref 150–400)
RBC: 4.22 MIL/uL (ref 3.87–5.11)
RDW: 16.9 % — ABNORMAL HIGH (ref 11.5–15.5)
WBC: 5.1 10*3/uL (ref 4.0–10.5)
nRBC: 0 % (ref 0.0–0.2)

## 2020-02-24 LAB — TROPONIN I (HIGH SENSITIVITY): Troponin I (High Sensitivity): 3 ng/L (ref <18)

## 2020-02-24 LAB — BASIC METABOLIC PANEL
Anion gap: 8 (ref 5–15)
BUN: 18 mg/dL (ref 6–20)
CO2: 26 mmol/L (ref 22–32)
Calcium: 9 mg/dL (ref 8.9–10.3)
Chloride: 101 mmol/L (ref 98–111)
Creatinine, Ser: 1.03 mg/dL — ABNORMAL HIGH (ref 0.44–1.00)
GFR calc Af Amer: >60 mL/min (ref >60)
GFR calc non Af Amer: >60 mL/min (ref >60)
Glucose, Bld: 105 mg/dL — ABNORMAL HIGH (ref 70–99)
Potassium: 3.5 mmol/L (ref 3.5–5.1)
Sodium: 135 mmol/L (ref 135–145)

## 2020-02-24 NOTE — ED Triage Notes (Signed)
First nurse note: Patient to ER from home via [facility] for c/o sharp mid sternal chest pain. Patient rated pain 8/10 upon EMS arrival, down to 6/10 upon arrival to ER. Patient has no h/o cardiac disorder per EMS. CBG 88, BP 144/102.

## 2020-02-24 NOTE — ED Notes (Signed)
Patient states she does not want to stay to be seen. CXR discontinued.

## 2020-02-24 NOTE — ED Triage Notes (Signed)
Patient reports chest pain worsens with movement, "feels like a muscle pain".

## 2020-02-25 ENCOUNTER — Telehealth: Payer: Self-pay | Admitting: Emergency Medicine

## 2020-02-25 NOTE — Telephone Encounter (Signed)
Called patient due to lwot to inquire about condition and follow up plans. Left message.   

## 2020-02-28 DIAGNOSIS — Z01818 Encounter for other preprocedural examination: Secondary | ICD-10-CM | POA: Diagnosis not present

## 2020-02-28 DIAGNOSIS — M899 Disorder of bone, unspecified: Secondary | ICD-10-CM | POA: Insufficient documentation

## 2020-03-03 ENCOUNTER — Ambulatory Visit: Payer: Medicaid Other | Attending: Oncology

## 2020-03-04 ENCOUNTER — Inpatient Hospital Stay: Payer: Medicaid Other | Attending: Oncology | Admitting: Oncology

## 2020-03-13 DIAGNOSIS — Z20822 Contact with and (suspected) exposure to covid-19: Secondary | ICD-10-CM | POA: Diagnosis not present

## 2020-03-13 DIAGNOSIS — Z01812 Encounter for preprocedural laboratory examination: Secondary | ICD-10-CM | POA: Diagnosis not present

## 2020-03-13 DIAGNOSIS — M899 Disorder of bone, unspecified: Secondary | ICD-10-CM | POA: Diagnosis not present

## 2020-03-18 DIAGNOSIS — M899 Disorder of bone, unspecified: Secondary | ICD-10-CM | POA: Diagnosis not present

## 2020-07-02 ENCOUNTER — Encounter: Payer: Self-pay | Admitting: *Deleted

## 2020-07-02 NOTE — Progress Notes (Signed)
Unable to contact pt to reschedule appts in the lung nodule clinic. Letter mailed to pt to call back to reschedule appts. Awaiting call back.

## 2020-07-29 HISTORY — PX: TUMOR REMOVAL: SHX12

## 2020-08-16 ENCOUNTER — Other Ambulatory Visit: Payer: Self-pay

## 2020-08-16 ENCOUNTER — Encounter: Payer: Self-pay | Admitting: Emergency Medicine

## 2020-08-16 ENCOUNTER — Emergency Department: Payer: Medicaid Other

## 2020-08-16 ENCOUNTER — Emergency Department
Admission: EM | Admit: 2020-08-16 | Discharge: 2020-08-16 | Disposition: A | Discharge status: Home / Self Care | Payer: Medicaid Other | Attending: Emergency Medicine | Admitting: Emergency Medicine

## 2020-08-16 DIAGNOSIS — R000 Tachycardia, unspecified: Secondary | ICD-10-CM | POA: Diagnosis not present

## 2020-08-16 DIAGNOSIS — R1013 Epigastric pain: Secondary | ICD-10-CM

## 2020-08-16 DIAGNOSIS — F17210 Nicotine dependence, cigarettes, uncomplicated: Secondary | ICD-10-CM | POA: Insufficient documentation

## 2020-08-16 DIAGNOSIS — R1011 Right upper quadrant pain: Secondary | ICD-10-CM | POA: Diagnosis not present

## 2020-08-16 LAB — COMPREHENSIVE METABOLIC PANEL
ALT: 11 U/L (ref 0–44)
AST: 14 U/L — ABNORMAL LOW (ref 15–41)
Albumin: 3.9 g/dL (ref 3.5–5.0)
Alkaline Phosphatase: 74 U/L (ref 38–126)
Anion gap: 9 (ref 5–15)
BUN: 15 mg/dL (ref 6–20)
CO2: 26 mmol/L (ref 22–32)
Calcium: 9.3 mg/dL (ref 8.9–10.3)
Chloride: 103 mmol/L (ref 98–111)
Creatinine, Ser: 0.86 mg/dL (ref 0.44–1.00)
GFR, Estimated: >60 mL/min (ref >60)
Glucose, Bld: 137 mg/dL — ABNORMAL HIGH (ref 70–99)
Potassium: 3.3 mmol/L — ABNORMAL LOW (ref 3.5–5.1)
Sodium: 138 mmol/L (ref 135–145)
Total Bilirubin: 0.5 mg/dL (ref 0.3–1.2)
Total Protein: 7.8 g/dL (ref 6.5–8.1)

## 2020-08-16 LAB — CBC
HCT: 37.3 % (ref 36.0–46.0)
Hemoglobin: 12.1 g/dL (ref 12.0–15.0)
MCH: 27.8 pg (ref 26.0–34.0)
MCHC: 32.4 g/dL (ref 30.0–36.0)
MCV: 85.7 fL (ref 80.0–100.0)
Platelets: 305 10*3/uL (ref 150–400)
RBC: 4.35 MIL/uL (ref 3.87–5.11)
RDW: 14.2 % (ref 11.5–15.5)
WBC: 5.7 10*3/uL (ref 4.0–10.5)
nRBC: 0 % (ref 0.0–0.2)

## 2020-08-16 LAB — LIPASE, BLOOD: Lipase: 21 U/L (ref 11–51)

## 2020-08-16 MED ORDER — FAMOTIDINE 40 MG PO TABS: 40.0000 mg | ORAL_TABLET | Freq: Every evening | ORAL | 0 refills | Status: DC

## 2020-08-16 MED ORDER — SUCRALFATE 1 G PO TABS: 1.0000 g | ORAL_TABLET | Freq: Four times a day (QID) | ORAL | 1 refills | Status: DC | PRN

## 2020-08-16 MED ORDER — FAMOTIDINE 20 MG PO TABS
20.0000 mg | ORAL_TABLET | Freq: Once | ORAL | Status: AC
  Administered 2020-08-16: 20 mg via ORAL
  Filled 2020-08-16: qty 1

## 2020-08-16 NOTE — Discharge Instructions (Signed)
Take the sucralfate when you have pain or before eating.  Take the famotidine daily.  Follow up with GI specialist.   Return to the ER for symptoms that change or worsen or for new concerns.

## 2020-08-16 NOTE — ED Notes (Signed)
Pt reports a stabbing pain at the top of her stomach that shoots around to her back; this pain begins about 2 hours after eating; wakes up in the middle of the night with this pain. Tender to palpation during exam in epigastric area

## 2020-08-16 NOTE — ED Triage Notes (Signed)
Pt reports for the last week she has had intermittent pain to her mid-epigastric area that radiates to her back. Pt states every time she eats something she gets a burning sensation

## 2020-08-22 NOTE — ED Provider Notes (Signed)
[facility] Emergency Department Provider Note ____________________________________________   Event Date/Time   First MD Initiated Contact with Patient 08/16/20 1721     (approximate)  I have reviewed the triage vital signs and the nursing notes.   HISTORY  Chief Complaint Abdominal Pain  HPI Karina Anderson is a 49 y.o. female with history of GERD and cholecystitis presents to the emergency department for treatment and evaluation of epigastric and right upper quadrant pain. Pain started a few days ago.  Patient states that she has had this pain in the past.  She states that it is aggravated by eating.  She has a burning sensation in the epigastric area that radiates into her back.  No alleviating measures attempted prior to arrival..         Past Medical History:  Diagnosis Date  . Hydradenitis   . Seizures [facility])     Patient Active Problem List   Diagnosis Date Noted  . Tobacco use disorder 03/13/2016  . Cannabis use disorder, moderate, dependence (HCC) 03/13/2016  . Bipolar affective disorder, manic, severe, with psychotic behavior (HCC) 03/12/2016  . Seizures (HCC) 03/11/2016  . Involuntary commitment 03/11/2016    History reviewed. No pertinent surgical history.  Prior to Admission medications   Medication Sig Start Date End Date Taking? Authorizing Provider  cyclobenzaprine (FLEXERIL) 5 MG tablet Take 1-2 tablets 3 times daily as needed 09/28/18   Enid Derry, PA-C  doxylamine, Sleep, (UNISOM) 25 MG tablet Take 50 mg by mouth at bedtime as needed.    [provider]  famotidine (PEPCID) 40 MG tablet Take 1 tablet (40 mg total) by mouth every evening. 08/16/20 08/16/21  Ethelbert Thain, Kasandra Knudsen, FNP  lactulose (CHRONULAC) 10 GM/15ML solution Take 30 mLs (20 g total) by mouth daily as needed for mild constipation. 03/16/19   Irean Hong, MD  ondansetron (ZOFRAN ODT) 4 MG disintegrating tablet Allow 1-2 tablets to dissolve in your mouth every  8 hours as needed for nausea/vomiting 11/29/19   Loleta Rose, MD  oxyCODONE-acetaminophen (PERCOCET) 5-325 MG tablet Take 1 tablet by mouth every 4 (four) hours as needed for severe pain. 12/11/19 12/10/20  Fisher, Roselyn Bering, PA-C  predniSONE (STERAPRED UNI-PAK 21 TAB) 10 MG (21) TBPK tablet Take 6 pills on day one then decrease by 1 pill each day 12/11/19   Faythe Ghee, PA-C  QUEtiapine (SEROQUEL) 400 MG tablet Take 2 tablets (800 mg total) by mouth at bedtime. Patient not taking: Reported on 07/23/2017 03/29/16   Pucilowska, Ellin Goodie, MD  sucralfate (CARAFATE) 1 g tablet Take 1 tablet (1 g total) by mouth 4 (four) times daily as needed (for abdominal discomfort, nausea, and/or vomiting). 08/16/20   Zaim Nitta, Rulon Eisenmenger B, FNP  omeprazole (PRILOSEC OTC) 20 MG tablet Take 1 tablet (20 mg total) by mouth daily. 11/29/19 08/16/20  Loleta Rose, MD    Allergies Citrus, Sulfamethoxazole-trimethoprim, Butorphanol, Butorphanol tartrate, Contrast media [iodinated diagnostic agents], Food, Ibuprofen, Ketorolac, Lorazepam, Naproxen, Prochlorperazine, and Nsaids  No family history on file.  Social History Social History   Tobacco Use  . Smoking status: Current Every Day Smoker    Packs/day: 0.50    Types: Cigarettes  . Smokeless tobacco: Never Used  Vaping Use  . Vaping Use: Never used  Substance Use Topics  . Alcohol use: No  . Drug use: No    Review of Systems  Constitutional: No fever/chills Eyes: No visual changes. ENT: No sore throat. Cardiovascular: Denies chest pain. Respiratory: Denies shortness  of breath. Gastrointestinal: Positive for abdominal pain with nausea.  No vomiting.  No diarrhea.  No constipation. Genitourinary: Negative for dysuria. Musculoskeletal: Negative for back pain. Skin: Negative for rash. Neurological: Negative for headaches, focal weakness or numbness  ____________________________________________   PHYSICAL EXAM:  VITAL SIGNS: ED Triage Vitals  Enc Vitals  Group     BP 08/16/20 1826 (!) 125/94     Pulse Rate 08/16/20 1826 92     Resp 08/16/20 1826 18     Temp 08/16/20 1826 98.4 F (36.9 C)     Temp src --      SpO2 08/16/20 1826 99 %     Weight 08/16/20 1426 180 lb (81.6 kg)     Height 08/16/20 1426 5\' 5"  (1.651 m)     Head Circumference --      Peak Flow --      Pain Score 08/16/20 1426 10     Pain Loc --      Pain Edu? --      Excl. in GC? --     Constitutional: Alert and oriented. Well appearing and in no acute distress. Eyes: Conjunctivae are normal.  Head: Atraumatic. Nose: No congestion/rhinnorhea. Mouth/Throat: Mucous membranes are moist.  Oropharynx non-erythematous. Neck: No stridor.   Hematological/Lymphatic/Immunilogical: No cervical lymphadenopathy. Cardiovascular: Normal rate, regular rhythm. Grossly normal heart sounds.  Good peripheral circulation. Respiratory: Normal respiratory effort.  No retractions. Lungs CTAB. Gastrointestinal: Soft and nontender. No distention. No abdominal bruits. Genitourinary:  Musculoskeletal: No lower extremity tenderness nor edema.  No joint effusions. Neurologic:  Normal speech and language. No gross focal neurologic deficits are appreciated. No gait instability. Skin:  Skin is warm, dry and intact. No rash noted. Psychiatric: Mood and affect are normal. Speech and behavior are normal.  ____________________________________________   LABS (all labs ordered are listed, but only abnormal results are displayed)  Labs Reviewed  COMPREHENSIVE METABOLIC PANEL - Abnormal; Notable for the following components:      Result Value   Potassium 3.3 (*)    Glucose, Bld 137 (*)    AST 14 (*)    All other components within normal limits  LIPASE, BLOOD  CBC   ____________________________________________  EKG  ED ECG REPORT I, Fatiha Guzy, FNP-BC personally viewed and interpreted this ECG.   Date: 08/17/2020  EKG Time: 1428  Rate: 113  Rhythm: sinus tachycardia  Axis: normal   Intervals:none  ST&T Change: no ST elevation  ____________________________________________  RADIOLOGY  ED MD interpretation:    08/19/2020 abdomen negative for acute concerns.   I, [place], personally viewed and evaluated these images (plain radiographs) as part of my medical decision making, as well as reviewing the written report by the radiologist.  Official radiology report(s): No results found.  ____________________________________________   PROCEDURES  Procedure(s) performed (including Critical Care):  Procedures  ____________________________________________   INITIAL IMPRESSION / ASSESSMENT AND PLAN     49 year old female presenting to the emergency department for treatment and evaluation of epigastric pain and burning that worsens with eating.  Will order ultrasound.  DIFFERENTIAL DIAGNOSIS  Cholecystitis, cholelithiasis, GERD  ED COURSE  Ultrasound of the right upper quadrant and epigastric areas negative for acute findings.  Labs are reassuring with the exception of a very mild hypokalemia.  Patient states that she has not been taking her PPI.  Plan will be to restart her daily medication follow-up with GI or primary care.  She is to return to the emergency department for symptoms of change  or worsen if unable to schedule an appointment.    ___________________________________________   FINAL CLINICAL IMPRESSION(S) / ED DIAGNOSES  Final diagnoses:  Abdominal pain, acute, right upper quadrant  Epigastric pain     ED Discharge Orders         Ordered    sucralfate (CARAFATE) 1 g tablet  4 times daily PRN        08/16/20 1847    famotidine (PEPCID) 40 MG tablet  Every evening        08/16/20 1847           Karina Anderson was evaluated in Emergency Department on 08/22/2020 for the symptoms described in the history of present illness. She was evaluated in the context of the global COVID-19 pandemic, which necessitated consideration that the patient  might be at risk for infection with the SARS-CoV-2 virus that causes COVID-19. Institutional protocols and algorithms that pertain to the evaluation of patients at risk for COVID-19 are in a state of rapid change based on information released by regulatory bodies including the CDC and federal and state organizations. These policies and algorithms were followed during the patient's care in the ED.   Note:  This document was prepared using Dragon voice recognition software and may include unintentional dictation errors.   Chinita Pester, FNP 08/22/20 Delfin Edis, MD 08/22/20 [phone]

## 2020-09-28 ENCOUNTER — Emergency Department
Admission: EM | Admit: 2020-09-28 | Discharge: 2020-09-28 | Disposition: A | Discharge status: Home / Self Care | Payer: Medicaid Other | Attending: Emergency Medicine | Admitting: Emergency Medicine

## 2020-09-28 ENCOUNTER — Other Ambulatory Visit: Payer: Self-pay

## 2020-09-28 DIAGNOSIS — L02214 Cutaneous abscess of groin: Secondary | ICD-10-CM | POA: Diagnosis present

## 2020-09-28 DIAGNOSIS — L732 Hidradenitis suppurativa: Secondary | ICD-10-CM | POA: Diagnosis not present

## 2020-09-28 DIAGNOSIS — F17210 Nicotine dependence, cigarettes, uncomplicated: Secondary | ICD-10-CM | POA: Diagnosis not present

## 2020-09-28 MED ORDER — HYDROCODONE-ACETAMINOPHEN 5-325 MG PO TABS: 1.0000 | ORAL_TABLET | Freq: Four times a day (QID) | ORAL | 0 refills | Status: DC | PRN

## 2020-09-28 MED ORDER — CLINDAMYCIN HCL 300 MG PO CAPS: 300.0000 mg | ORAL_CAPSULE | Freq: Three times a day (TID) | ORAL | 0 refills | Status: AC

## 2020-09-28 MED ORDER — ACETAMINOPHEN 325 MG PO TABS
650.0000 mg | ORAL_TABLET | Freq: Once | ORAL | Status: AC
  Administered 2020-09-28: 325 mg via ORAL
  Filled 2020-09-28: qty 2

## 2020-09-28 NOTE — ED Provider Notes (Signed)
[facility] Emergency Department Provider Note   ____________________________________________   Event Date/Time   First MD Initiated Contact with Patient 09/28/20 1009     (approximate)  I have reviewed the triage vital signs and the nursing notes.   HISTORY  Chief Complaint Abscess   HPI Karina Anderson is a 50 y.o. female presents to the ED with complaint of 2 abscesses on her inner thighs for the last 3 days.  Patient denies any drainage.  There is been no fever, chills but she does complain of pain.  She rates her pain as 10/10.      Past Medical History:  Diagnosis Date  . Hydradenitis   . Seizures [facility])     Patient Active Problem List   Diagnosis Date Noted  . Tobacco use disorder 03/13/2016  . Cannabis use disorder, moderate, dependence (HCC) 03/13/2016  . Bipolar affective disorder, manic, severe, with psychotic behavior (HCC) 03/12/2016  . Seizures (HCC) 03/11/2016  . Involuntary commitment 03/11/2016    History reviewed. No pertinent surgical history.  Prior to Admission medications   Medication Sig Start Date End Date Taking? Authorizing Provider  clindamycin (CLEOCIN) 300 MG capsule Take 1 capsule (300 mg total) by mouth 3 (three) times daily for 7 days. 09/28/20 10/05/20 Yes Tommi Rumps, PA-C  HYDROcodone-acetaminophen (NORCO/VICODIN) 5-325 MG tablet Take 1 tablet by mouth every 6 (six) hours as needed. 09/28/20  Yes Tommi Rumps, PA-C  doxylamine, Sleep, (UNISOM) 25 MG tablet Take 50 mg by mouth at bedtime as needed.    [provider]  QUEtiapine (SEROQUEL) 400 MG tablet Take 2 tablets (800 mg total) by mouth at bedtime. Patient not taking: Reported on 07/23/2017 03/29/16   Pucilowska, Ellin Goodie, MD  famotidine (PEPCID) 40 MG tablet Take 1 tablet (40 mg total) by mouth every evening. 08/16/20 09/28/20  Triplett, Kasandra Knudsen, FNP  omeprazole (PRILOSEC OTC) 20 MG tablet Take 1 tablet (20 mg total) by mouth daily. 11/29/19  08/16/20  Loleta Rose, MD  sucralfate (CARAFATE) 1 g tablet Take 1 tablet (1 g total) by mouth 4 (four) times daily as needed (for abdominal discomfort, nausea, and/or vomiting). 08/16/20 09/28/20  Triplett, Rulon Eisenmenger B, FNP    Allergies Citrus, Sulfamethoxazole-trimethoprim, Butorphanol, Butorphanol tartrate, Contrast media [iodinated diagnostic agents], Food, Ibuprofen, Ketorolac, Lorazepam, Naproxen, Prochlorperazine, and Nsaids  No family history on file.  Social History Social History   Tobacco Use  . Smoking status: Current Every Day Smoker    Packs/day: 0.50    Types: Cigarettes  . Smokeless tobacco: Never Used  Vaping Use  . Vaping Use: Never used  Substance Use Topics  . Alcohol use: No  . Drug use: No    Review of Systems Constitutional: No fever/chills Cardiovascular: Denies chest pain. Respiratory: Denies shortness of breath. Gastrointestinal: No abdominal pain. Genitourinary: Negative for dysuria. Musculoskeletal: Negative for musculoskeletal pain. Skin: Positive for "bumps". Neurological: Negative for headaches, focal weakness or numbness.  ____________________________________________   PHYSICAL EXAM:  VITAL SIGNS: ED Triage Vitals  Enc Vitals Group     BP 09/28/20 0735 (!) 139/103     Pulse Rate 09/28/20 0735 88     Resp 09/28/20 0735 17     Temp 09/28/20 0735 98.2 F (36.8 C)     Temp Source 09/28/20 0735 Oral     SpO2 09/28/20 0735 99 %     Weight 09/28/20 0736 180 lb (81.6 kg)     Height 09/28/20 0736 5\' 5"  (1.651 m)  Head Circumference --      Peak Flow --      Pain Score 09/28/20 0736 10     Pain Loc --      Pain Edu? --      Excl. in GC? --     Constitutional: Alert and oriented. Well appearing and in no acute distress. Eyes: Conjunctivae are normal.  Head: Atraumatic. Neck: No stridor.   Cardiovascular: Normal rate, regular rhythm. Grossly normal heart sounds.  Good peripheral circulation. Respiratory: Normal respiratory effort.  No  retractions. Lungs CTAB. Gastrointestinal: Soft and nontender. No distention.  Musculoskeletal: Moves upper and lower extremities with any difficulty and patient is able to ambulate without any assistance. Neurologic:  Normal speech and language. No gross focal neurologic deficits are appreciated. No gait instability. Skin:  Skin is warm, dry and intact.  2 papules, pinpoint, to the inner right thigh and one papule to the inner left thigh without erythema.  No pustules are noted.  No evidence of abscess at this time.  No drainage present. Psychiatric: Mood and affect are normal. Speech and behavior are normal.  ____________________________________________   LABS (all labs ordered are listed, but only abnormal results are displayed)  Labs Reviewed - No data to display ____________________________________________   PROCEDURES  Procedure(s) performed (including Critical Care):  Procedures   ____________________________________________   INITIAL IMPRESSION / ASSESSMENT AND PLAN / ED COURSE  As part of my medical decision making, I reviewed the following data within the electronic MEDICAL RECORD NUMBER Notes from prior ED visits and [place] Controlled Substance Database  50 year old female presents to the ED with complaint of abscess on each leg for the last 3 days.  Patient denies any drainage.  She denies any fever or chills.  Areas are nonerythematous and without pustules.  No abscess formation is appreciated at this time.  Areas as described above are very superficial.  Patient was given acetaminophen while in the ED due to her 44 year old daughter being evaluated for abdominal pain in the ED.  Her prescriptions for clindamycin were sent to the pharmacy along with a prescription for Norco 1 every 6 hours if needed for pain #8.  Patient is to follow-up with her PCP if any continued problems.  ____________________________________________   FINAL CLINICAL IMPRESSION(S) / ED DIAGNOSES  Final  diagnoses:  Hydradenitis     ED Discharge Orders         Ordered    clindamycin (CLEOCIN) 300 MG capsule  3 times daily        09/28/20 1045    HYDROcodone-acetaminophen (NORCO/VICODIN) 5-325 MG tablet  Every 6 hours PRN        09/28/20 1045          *Please note:  Karina Anderson was evaluated in Emergency Department on 09/28/2020 for the symptoms described in the history of present illness. She was evaluated in the context of the global COVID-19 pandemic, which necessitated consideration that the patient might be at risk for infection with the SARS-CoV-2 virus that causes COVID-19. Institutional protocols and algorithms that pertain to the evaluation of patients at risk for COVID-19 are in a state of rapid change based on information released by regulatory bodies including the CDC and federal and state organizations. These policies and algorithms were followed during the patient's care in the ED.  Some ED evaluations and interventions may be delayed as a result of limited staffing during and the pandemic.*   Note:  This document was prepared using Dragon voice  recognition software and may include unintentional dictation errors.    Tommi Rumps, PA-C 09/28/20 1057    Jene Every, MD 09/28/20 1114

## 2020-09-28 NOTE — Discharge Instructions (Signed)
Begin taking antibiotic as directed until completely finished. Use warm moist compresses to the areas frequently. You may also soak in a warm tub of water. Take medication only as directed. You should call make an appointment with your primary care provider if any continued problems.

## 2020-09-28 NOTE — ED Triage Notes (Signed)
Pt c/o two abscess between her legs for the past 3 days with no drainage.

## 2020-12-01 ENCOUNTER — Ambulatory Visit: Payer: Medicaid Other | Admitting: Nurse Practitioner

## 2020-12-01 ENCOUNTER — Encounter: Payer: Self-pay | Admitting: Nurse Practitioner

## 2020-12-01 ENCOUNTER — Other Ambulatory Visit: Payer: Self-pay

## 2020-12-01 VITALS — BP 128/94 | HR 112 | Temp 98.6°F | Ht 63.5 in | Wt 179.6 lb

## 2020-12-01 DIAGNOSIS — G8929 Other chronic pain: Secondary | ICD-10-CM | POA: Diagnosis not present

## 2020-12-01 DIAGNOSIS — M5442 Lumbago with sciatica, left side: Secondary | ICD-10-CM

## 2020-12-01 DIAGNOSIS — Z7689 Persons encountering health services in other specified circumstances: Secondary | ICD-10-CM

## 2020-12-01 DIAGNOSIS — R569 Unspecified convulsions: Secondary | ICD-10-CM | POA: Diagnosis not present

## 2020-12-01 DIAGNOSIS — Z9151 Personal history of suicidal behavior: Secondary | ICD-10-CM | POA: Insufficient documentation

## 2020-12-01 DIAGNOSIS — M5441 Lumbago with sciatica, right side: Secondary | ICD-10-CM

## 2020-12-01 DIAGNOSIS — Z8659 Personal history of other mental and behavioral disorders: Secondary | ICD-10-CM | POA: Diagnosis not present

## 2020-12-01 DIAGNOSIS — F17200 Nicotine dependence, unspecified, uncomplicated: Secondary | ICD-10-CM

## 2020-12-01 MED ORDER — PREDNISONE 10 MG PO TABS: ORAL_TABLET | ORAL | 0 refills | Status: DC

## 2020-12-01 MED ORDER — PHENYTOIN SODIUM EXTENDED 100 MG PO CAPS: 100.0000 mg | ORAL_CAPSULE | Freq: Three times a day (TID) | ORAL | 2 refills | Status: DC

## 2020-12-01 MED ORDER — TRIAMCINOLONE ACETONIDE 40 MG/ML IJ SUSP
40.0000 mg | Freq: Once | INTRAMUSCULAR | Status: AC
  Administered 2020-12-01: 40 mg via INTRAMUSCULAR

## 2020-12-01 NOTE — Assessment & Plan Note (Signed)
Currently smokes 1/2 ppd. Discussed smoking cessation. Not interested at this time.

## 2020-12-01 NOTE — Assessment & Plan Note (Signed)
Chronic. Has been to orthopedics for the last 3 years. Tried PT, gabapentin, back injections in the past with no relief. Stated she was prescribed vicodin which helped with the pain. Lumbar x-ray reviewed from 2018 which showed no acute changes. She stated gabapentin just made her sleepy and doesn't want to take it. Does not want to go to PT. Kenalog IM today and prednisone taper. Will refer to pain management. Information and stretches for sciatica given for her to do at home. Can continue tylenol and otc pain patches. Can also try ice or heat.

## 2020-12-01 NOTE — Patient Instructions (Signed)
It was great to see you!  We are going to check some blood work today and will call you with the results. We are giving you a kenalog injection and prednisone taper for your pain. We are also referring you to pain management.   Let's follow-up in 1 month, sooner if you have concerns.  If a referral was placed today, you will be contacted for an appointment. Please note that routine referrals can sometimes take up to 3-4 weeks to process. Please call our office if you haven't heard anything after this time frame.  Take care,  Rodman Pickle, NP   Sciatica  Sciatica is pain, weakness, tingling, or loss of feeling (numbness) along the sciatic nerve. The sciatic nerve starts in the lower back and goes down the back of each leg. Sciatica usually goes away on its own or with treatment. Sometimes, sciatica may come back (recur). What are the causes? This condition happens when the sciatic nerve is pinched or has pressure put on it. This may be the result of:  A disk in between the bones of the spine bulging out too far (herniated disk).  Changes in the spinal disks that occur with aging.  A condition that affects a muscle in the butt.  Extra bone growth near the sciatic nerve.  A break (fracture) of the area between your hip bones (pelvis).  Pregnancy.  Tumor. This is rare. What increases the risk? You are more likely to develop this condition if you:  Play sports that put pressure or stress on the spine.  Have poor strength and ease of movement (flexibility).  Have had a back injury in the past.  Have had back surgery.  Sit for long periods of time.  Do activities that involve bending or lifting over and over again.  Are very overweight (obese). What are the signs or symptoms? Symptoms can vary from mild to very bad. They may include:  Any of these problems in the lower back, leg, hip, or butt: ? Mild tingling, loss of feeling, or dull aches. ? Burning  sensations. ? Sharp pains.  Loss of feeling in the back of the calf or the sole of the foot.  Leg weakness.  Very bad back pain that makes it hard to move. These symptoms may get worse when you cough, sneeze, or laugh. They may also get worse when you sit or stand for long periods of time. How is this treated? This condition often gets better without any treatment. However, treatment may include:  Changing or cutting back on physical activity when you have pain.  Doing exercises and stretching.  Putting ice or heat on the affected area.  Medicines that help: ? To relieve pain and swelling. ? To relax your muscles.  Shots (injections) of medicines that help to relieve pain, irritation, and swelling.  Surgery. Follow these instructions at home: Medicines  Take over-the-counter and prescription medicines only as told by your doctor.  Ask your doctor if the medicine prescribed to you: ? Requires you to avoid driving or using heavy machinery. ? Can cause trouble pooping (constipation). You may need to take these steps to prevent or treat trouble pooping:  Drink enough fluids to keep your pee (urine) pale yellow.  Take over-the-counter or prescription medicines.  Eat foods that are high in fiber. These include beans, whole grains, and fresh fruits and vegetables.  Limit foods that are high in fat and sugar. These include fried or sweet foods. Managing pain  If  told, put ice on the affected area. ? Put ice in a plastic bag. ? Place a towel between your skin and the bag. ? Leave the ice on for 20 minutes, 2-3 times a day.  If told, put heat on the affected area. Use the heat source that your doctor tells you to use, such as a moist heat pack or a heating pad. ? Place a towel between your skin and the heat source. ? Leave the heat on for 20-30 minutes. ? Remove the heat if your skin turns bright red. This is very important if you are unable to feel pain, heat, or cold. You  may have a greater risk of getting burned.      Activity  Return to your normal activities as told by your doctor. Ask your doctor what activities are safe for you.  Avoid activities that make your symptoms worse.  Take short rests during the day. ? When you rest for a long time, do some physical activity or stretching between periods of rest. ? Avoid sitting for a long time without moving. Get up and move around at least one time each hour.  Exercise and stretch regularly, as told by your doctor.  Do not lift anything that is heavier than 10 lb (4.5 kg) while you have symptoms of sciatica. ? Avoid lifting heavy things even when you do not have symptoms. ? Avoid lifting heavy things over and over.  When you lift objects, always lift in a way that is safe for your body. To do this, you should: ? Bend your knees. ? Keep the object close to your body. ? Avoid twisting.   General instructions  Stay at a healthy weight.  Wear comfortable shoes that support your feet. Avoid wearing high heels.  Avoid sleeping on a mattress that is too soft or too hard. You might have less pain if you sleep on a mattress that is firm enough to support your back.  Keep all follow-up visits as told by your doctor. This is important. Contact a doctor if:  You have pain that: ? Wakes you up when you are sleeping. ? Gets worse when you lie down. ? Is worse than the pain you have had in the past. ? Lasts longer than 4 weeks.  You lose weight without trying. Get help right away if:  You cannot control when you pee (urinate) or poop (have a bowel movement).  You have weakness in any of these areas and it gets worse: ? Lower back. ? The area between your hip bones. ? Butt. ? Legs.  You have redness or swelling of your back.  You have a burning feeling when you pee. Summary  Sciatica is pain, weakness, tingling, or loss of feeling (numbness) along the sciatic nerve.  This condition happens  when the sciatic nerve is pinched or has pressure put on it.  Sciatica can cause pain, tingling, or loss of feeling (numbness) in the lower back, legs, hips, and butt.  Treatment often includes rest, exercise, medicines, and putting ice or heat on the affected area. This information is not intended to replace advice given to you by your health care provider. Make sure you discuss any questions you have with your health care provider. Document Revised: 09/03/2018 Document Reviewed: 09/03/2018 Elsevier Patient Education  [place].

## 2020-12-01 NOTE — Assessment & Plan Note (Signed)
History of suicide attempt reported with 03/11/16 admission for psychosis. Stated she overdosed on tylenol. Currently no suicidal ideation. Follow-up with any concerns.

## 2020-12-01 NOTE — Assessment & Plan Note (Signed)
Chronic. Last seizure 6 months ago. She states her last pcp was prescribing her dilantin. Will obtain records from last pcp. She has been out of her dilantin for the last 2 months. Refill sent to the pharmacy. Dilantin level, cmp, and cbc check today.

## 2020-12-01 NOTE — Assessment & Plan Note (Addendum)
States that 3 years ago she had a nervous breakdown and was admitted to the hospital for 3.5 weeks because her mom died. Upon review of her chart, shows IVC admission 03/11/16 for psychosis and a suicide attempt. PHQ9 is 0 today. Denies suicidal ideation. States that she has coped with her mother's death and now has a 49 year old grand daughter she helps care for. Follow-up with any concerns.

## 2020-12-01 NOTE — Progress Notes (Signed)
New Patient Office Visit  Subjective:  Patient ID: Karina Anderson, female    DOB: May 10, 1971  Age: 50 y.o. MRN: [id number]  CC:  Chief Complaint  Patient presents with  . Establish Care    Pt states she has chronic lower back pain and seizures. Requesting something for pain and a refill on dilantin    HPI [name] presents for new patient visit to establish care.  Introduced to Designer, jewellery role and practice setting.  All questions answered.  Discussed provider/patient relationship and expectations.  BACK PAIN  [name] has a history of chronic back pain with bilateral sciatica since a traumatic child birth 17 years ago. She has been seeing orthopedics for the last 3 years. She has tried gabapentin, physical therapy, and back injections. She was given vicodin as needed for the pain from her last orthopedist. She states that they stopped prescribing her vicodin because she was "allegedly selling it." She denies this and states it was based on word of mouth. States that the orthopedist didn't even test her to see if she had the pain medication in her system.  Duration: Back pain since delivery of last daughter 17 years ago Mechanism of injury: birth of last child Location: midline and low back Onset: gradual Severity: 10/10 Quality: sharp Frequency: constant Radiation: R leg below the knee and L leg below the knee Aggravating factors: walking and prolonged sitting Alleviating factors: APAP, patches, which help for 2 hours Status: stable Treatments attempted: APAP  Relief with NSAIDs?: No NSAIDs Taken Nighttime pain:  yes Paresthesias / decreased sensation:  no Bowel / bladder incontinence:  no Fevers:  no Dysuria / urinary frequency:  no  SEIZURE  Last seizure 6 months ago. Triggered by stress. Tolerating dilantin well. Has been out of medication for last 2 months.    Depression screen [facility] 2/9 12/01/2020  Decreased Interest 0  Down, Depressed, Hopeless 0  PHQ - 2  Score 0  Altered sleeping 0  Tired, decreased energy 0  Change in appetite 0  Feeling bad or failure about yourself  0  Trouble concentrating 0  Moving slowly or fidgety/restless 0  Suicidal thoughts 0  PHQ-9 Score 0  Difficult doing work/chores Not difficult at all    Past Medical History:  Diagnosis Date  . Chronic lower back pain   . Hydradenitis   . Seizures ([place])     Past Surgical History:  Procedure Laterality Date  . DILATION AND CURETTAGE OF UTERUS     3 miscarriages  . HYDRADENITIS EXCISION     R underarm  . TUBAL LIGATION    . TUMOR REMOVAL  07/2020   index finger on L hand    Family History  Problem Relation Age of Onset  . Seizures Mother   . Diabetes Mother   . Heart attack Father   . Hypertension Brother   . Eczema Daughter   . Anxiety disorder Maternal Grandmother   . Hypertension Brother   . Hypertension Brother   . Hypertension Brother   . Hypertension Brother     Social History   Socioeconomic History  . Marital status: Single    Spouse name: Not on file  . Number of children: Not on file  . Years of education: Not on file  . Highest education level: Not on file  Occupational History  . Not on file  Tobacco Use  . Smoking status: Current Every Day Smoker    Packs/day: 0.50    Types: Cigarettes  .  Smokeless tobacco: Never Used  Vaping Use  . Vaping Use: Never used  Substance and Sexual Activity  . Alcohol use: No  . Drug use: No  . Sexual activity: Not Currently  Other Topics Concern  . Not on file  Social History Narrative  . Not on file   Social Determinants of Health   Financial Resource Strain: Not on file  Food Insecurity: Not on file  Transportation Needs: Not on file  Physical Activity: Not on file  Stress: Not on file  Social Connections: Not on file  Intimate Partner Violence: Not on file    ROS Review of Systems  Constitutional: Negative.   HENT: Negative.   Eyes: Negative.   Respiratory: Negative.    Cardiovascular: Negative.   Gastrointestinal: Negative.   Endocrine: Negative.   Genitourinary: Positive for frequency.  Musculoskeletal: Positive for back pain.  Skin: Negative.   Allergic/Immunologic: Negative.   Neurological: Negative.   Hematological: Negative.   Psychiatric/Behavioral: Negative.     Objective:   Today's Vitals: BP (!) 128/94   Pulse (!) 112   Temp 98.6 F (37 C) (Oral)   Ht 5' 3.5" (1.613 m)   Wt 179 lb 9.6 oz (81.5 kg)   LMP 11/19/2020 (Approximate)   SpO2 98%   BMI 31.32 kg/m   Physical Exam Vitals and nursing note reviewed.  Constitutional:      General: She is not in acute distress.    Appearance: Normal appearance.  HENT:     Head: Normocephalic and atraumatic.     Right Ear: Tympanic membrane, ear canal and external ear normal.     Left Ear: Tympanic membrane, ear canal and external ear normal.     Nose: Nose normal.     Mouth/Throat:     Mouth: Mucous membranes are moist.     Pharynx: Oropharynx is clear.  Eyes:     Conjunctiva/sclera: Conjunctivae normal.  Cardiovascular:     Rate and Rhythm: Normal rate and regular rhythm.     Pulses: Normal pulses.     Heart sounds: Normal heart sounds.  Pulmonary:     Effort: Pulmonary effort is normal.     Breath sounds: Normal breath sounds.  Abdominal:     General: Bowel sounds are normal.     Palpations: Abdomen is soft.     Tenderness: There is no abdominal tenderness.  Musculoskeletal:     Cervical back: Normal range of motion.     Comments: Limited range of motion to lumbar spine due to pain. Positive straight leg raise bilaterally.   Skin:    General: Skin is warm and dry.  Neurological:     General: No focal deficit present.     Mental Status: She is alert and oriented to person, place, and time.     Cranial Nerves: No cranial nerve deficit.     Coordination: Coordination normal.     Gait: Gait normal.  Psychiatric:        Mood and Affect: Mood normal.        Behavior:  Behavior normal.        Thought Content: Thought content normal.        Judgment: Judgment normal.     Assessment & Plan:   Problem List Items Addressed This Visit      Nervous and Auditory   Chronic midline low back pain with bilateral sciatica - Primary    Chronic. Has been to orthopedics for the last 3 years. Tried PT, gabapentin,  back injections in the past with no relief. Stated she was prescribed vicodin which helped with the pain. Lumbar x-ray reviewed from 2018 which showed no acute changes. She stated gabapentin just made her sleepy and doesn't want to take it. Does not want to go to PT. Kenalog IM today and prednisone taper. Will refer to pain management. Information and stretches for sciatica given for her to do at home. Can continue tylenol and otc pain patches. Can also try ice or heat.      Relevant Medications   phenytoin (DILANTIN) 100 MG ER capsule   predniSONE (DELTASONE) 10 MG tablet   Other Relevant Orders   Ambulatory referral to Pain Clinic     Other   Seizures (HCC)    Chronic. Last seizure 6 months ago. She states her last pcp was prescribing her dilantin. Will obtain records from last pcp. She has been out of her dilantin for the last 2 months. Refill sent to the pharmacy. Dilantin level, cmp, and cbc check today.       Relevant Medications   phenytoin (DILANTIN) 100 MG ER capsule   Other Relevant Orders   Dilantin (Phenytoin) level, total   Comp Met (CMET)   CBC w/Diff   Tobacco use disorder    Currently smokes 1/2 ppd. Discussed smoking cessation. Not interested at this time.       History of psychosis    States that 3 years ago she had a nervous breakdown and was admitted to the hospital for 3.5 weeks because her mom died. Upon review of her chart, shows IVC admission 03/11/16 for psychosis and a suicide attempt. PHQ9 is 0 today. Denies suicidal ideation. States that she has coped with her mother's death and now has a 22 year old grand daughter she helps  care for. Follow-up with any concerns.      History of suicide attempt    History of suicide attempt reported with 03/11/16 admission for psychosis. Stated she overdosed on tylenol. Currently no suicidal ideation. Follow-up with any concerns.        Other Visit Diagnoses    Encounter to establish care          Outpatient Encounter Medications as of 12/01/2020  Medication Sig  . predniSONE (DELTASONE) 10 MG tablet Take 6 tablets today, then 5 tablets tomorrow, then decrease by 1 tablet every day until gone  . [DISCONTINUED] phenytoin (DILANTIN) 100 MG ER capsule Take 100 mg by mouth 3 (three) times daily.  Marland Kitchen HYDROcodone-acetaminophen (NORCO/VICODIN) 5-325 MG tablet Take 1 tablet by mouth every 6 (six) hours as needed. (Patient not taking: Reported on 12/01/2020)  . phenytoin (DILANTIN) 100 MG ER capsule Take 1 capsule (100 mg total) by mouth 3 (three) times daily.  . [DISCONTINUED] doxylamine, Sleep, (UNISOM) 25 MG tablet Take 50 mg by mouth at bedtime as needed.  . [DISCONTINUED] famotidine (PEPCID) 40 MG tablet Take 1 tablet (40 mg total) by mouth every evening.  . [DISCONTINUED] omeprazole (PRILOSEC OTC) 20 MG tablet Take 1 tablet (20 mg total) by mouth daily.  . [DISCONTINUED] QUEtiapine (SEROQUEL) 400 MG tablet Take 2 tablets (800 mg total) by mouth at bedtime. (Patient not taking: Reported on 07/23/2017)  . [DISCONTINUED] sucralfate (CARAFATE) 1 g tablet Take 1 tablet (1 g total) by mouth 4 (four) times daily as needed (for abdominal discomfort, nausea, and/or vomiting).  . [EXPIRED] triamcinolone acetonide (KENALOG-40) injection 40 mg    No facility-administered encounter medications on file as of 12/01/2020.  Follow-up: Return in about 4 weeks (around 12/29/2020) for physical.   Charyl Dancer, NP

## 2020-12-02 LAB — COMPREHENSIVE METABOLIC PANEL
ALT: 10 IU/L (ref 0–32)
AST: 16 IU/L (ref 0–40)
Albumin/Globulin Ratio: 1.5 (ref 1.2–2.2)
Albumin: 4.3 g/dL (ref 3.8–4.8)
Alkaline Phosphatase: 83 IU/L (ref 44–121)
BUN/Creatinine Ratio: 18 (ref 9–23)
BUN: 14 mg/dL (ref 6–24)
Bilirubin Total: 0.3 mg/dL (ref 0.0–1.2)
CO2: 23 mmol/L (ref 20–29)
Calcium: 9.5 mg/dL (ref 8.7–10.2)
Chloride: 107 mmol/L — ABNORMAL HIGH (ref 96–106)
Creatinine, Ser: 0.78 mg/dL (ref 0.57–1.00)
Globulin, Total: 2.8 g/dL (ref 1.5–4.5)
Glucose: 97 mg/dL (ref 65–99)
Potassium: 4 mmol/L (ref 3.5–5.2)
Sodium: 146 mmol/L — ABNORMAL HIGH (ref 134–144)
Total Protein: 7.1 g/dL (ref 6.0–8.5)
eGFR: 94 mL/min/{1.73_m2} (ref >59)

## 2020-12-02 LAB — CBC WITH DIFFERENTIAL/PLATELET
Basophils Absolute: 0 10*3/uL (ref 0.0–0.2)
Basos: 1 %
EOS (ABSOLUTE): 0.1 10*3/uL (ref 0.0–0.4)
Eos: 2 %
Hematocrit: 36.1 % (ref 34.0–46.6)
Hemoglobin: 11.8 g/dL (ref 11.1–15.9)
Immature Grans (Abs): 0 10*3/uL (ref 0.0–0.1)
Immature Granulocytes: 0 %
Lymphocytes Absolute: 1.6 10*3/uL (ref 0.7–3.1)
Lymphs: 36 %
MCH: 27.8 pg (ref 26.6–33.0)
MCHC: 32.7 g/dL (ref 31.5–35.7)
MCV: 85 fL (ref 79–97)
Monocytes Absolute: 0.5 10*3/uL (ref 0.1–0.9)
Monocytes: 10 %
Neutrophils Absolute: 2.3 10*3/uL (ref 1.4–7.0)
Neutrophils: 51 %
Platelets: 319 10*3/uL (ref 150–450)
RBC: 4.24 x10E6/uL (ref 3.77–5.28)
RDW: 14 % (ref 11.7–15.4)
WBC: 4.5 10*3/uL (ref 3.4–10.8)

## 2020-12-02 LAB — PHENYTOIN LEVEL, TOTAL: Phenytoin (Dilantin), Serum: <0.8 ug/mL — ABNORMAL LOW (ref 10.0–20.0)

## 2020-12-09 ENCOUNTER — Other Ambulatory Visit: Payer: Self-pay

## 2020-12-09 ENCOUNTER — Emergency Department: Payer: Medicaid Other

## 2020-12-09 ENCOUNTER — Emergency Department
Admission: EM | Admit: 2020-12-09 | Discharge: 2020-12-09 | Disposition: A | Discharge status: Home / Self Care | Payer: Medicaid Other | Attending: Emergency Medicine | Admitting: Emergency Medicine

## 2020-12-09 DIAGNOSIS — K2900 Acute gastritis without bleeding: Secondary | ICD-10-CM | POA: Insufficient documentation

## 2020-12-09 DIAGNOSIS — R1013 Epigastric pain: Secondary | ICD-10-CM | POA: Diagnosis present

## 2020-12-09 DIAGNOSIS — F17210 Nicotine dependence, cigarettes, uncomplicated: Secondary | ICD-10-CM | POA: Diagnosis not present

## 2020-12-09 DIAGNOSIS — R1011 Right upper quadrant pain: Secondary | ICD-10-CM | POA: Diagnosis not present

## 2020-12-09 LAB — CBC WITH DIFFERENTIAL/PLATELET
Abs Immature Granulocytes: 0.05 10*3/uL (ref 0.00–0.07)
Basophils Absolute: 0 10*3/uL (ref 0.0–0.1)
Basophils Relative: 0 %
Eosinophils Absolute: 0 10*3/uL (ref 0.0–0.5)
Eosinophils Relative: 0 %
HCT: 37 % (ref 36.0–46.0)
Hemoglobin: 12.4 g/dL (ref 12.0–15.0)
Immature Granulocytes: 1 %
Lymphocytes Relative: 15 %
Lymphs Abs: 1.4 10*3/uL (ref 0.7–4.0)
MCH: 27.6 pg (ref 26.0–34.0)
MCHC: 33.5 g/dL (ref 30.0–36.0)
MCV: 82.4 fL (ref 80.0–100.0)
Monocytes Absolute: 0.3 10*3/uL (ref 0.1–1.0)
Monocytes Relative: 4 %
Neutro Abs: 7.5 10*3/uL (ref 1.7–7.7)
Neutrophils Relative %: 80 %
Platelets: 326 10*3/uL (ref 150–400)
RBC: 4.49 MIL/uL (ref 3.87–5.11)
RDW: 15.4 % (ref 11.5–15.5)
WBC: 9.4 10*3/uL (ref 4.0–10.5)
nRBC: 0 % (ref 0.0–0.2)

## 2020-12-09 LAB — COMPREHENSIVE METABOLIC PANEL
ALT: 15 U/L (ref 0–44)
AST: 18 U/L (ref 15–41)
Albumin: 3.8 g/dL (ref 3.5–5.0)
Alkaline Phosphatase: 68 U/L (ref 38–126)
Anion gap: 9 (ref 5–15)
BUN: 16 mg/dL (ref 6–20)
CO2: 22 mmol/L (ref 22–32)
Calcium: 9.2 mg/dL (ref 8.9–10.3)
Chloride: 103 mmol/L (ref 98–111)
Creatinine, Ser: 0.65 mg/dL (ref 0.44–1.00)
GFR, Estimated: >60 mL/min (ref >60)
Glucose, Bld: 166 mg/dL — ABNORMAL HIGH (ref 70–99)
Potassium: 3.9 mmol/L (ref 3.5–5.1)
Sodium: 134 mmol/L — ABNORMAL LOW (ref 135–145)
Total Bilirubin: 0.5 mg/dL (ref 0.3–1.2)
Total Protein: 7.8 g/dL (ref 6.5–8.1)

## 2020-12-09 LAB — LIPASE, BLOOD: Lipase: 21 U/L (ref 11–51)

## 2020-12-09 MED ORDER — FAMOTIDINE 20 MG PO TABS
40.0000 mg | ORAL_TABLET | Freq: Once | ORAL | Status: AC
  Administered 2020-12-09: 40 mg via ORAL
  Filled 2020-12-09: qty 2

## 2020-12-09 MED ORDER — SUCRALFATE 1 G PO TABS: 1.0000 g | ORAL_TABLET | Freq: Four times a day (QID) | ORAL | 1 refills | Status: DC

## 2020-12-09 MED ORDER — SODIUM CHLORIDE 0.9 % IV BOLUS
1000.0000 mL | Freq: Once | INTRAVENOUS | Status: AC
  Administered 2020-12-09: 1000 mL via INTRAVENOUS

## 2020-12-09 MED ORDER — ONDANSETRON HCL 4 MG/2ML IJ SOLN
4.0000 mg | Freq: Once | INTRAMUSCULAR | Status: AC
  Administered 2020-12-09: 4 mg via INTRAVENOUS
  Filled 2020-12-09: qty 2

## 2020-12-09 MED ORDER — FAMOTIDINE 20 MG PO TABS: 20.0000 mg | ORAL_TABLET | Freq: Two times a day (BID) | ORAL | 0 refills | Status: DC

## 2020-12-09 MED ORDER — METOCLOPRAMIDE HCL 10 MG PO TABS: 10.0000 mg | ORAL_TABLET | Freq: Four times a day (QID) | ORAL | 0 refills | Status: DC | PRN

## 2020-12-09 MED ORDER — PANTOPRAZOLE SODIUM 40 MG IV SOLR
40.0000 mg | Freq: Once | INTRAVENOUS | Status: AC
  Administered 2020-12-09: 40 mg via INTRAVENOUS
  Filled 2020-12-09: qty 40

## 2020-12-09 MED ORDER — METOCLOPRAMIDE HCL 5 MG/ML IJ SOLN
10.0000 mg | Freq: Once | INTRAMUSCULAR | Status: AC
  Administered 2020-12-09: 10 mg via INTRAVENOUS
  Filled 2020-12-09: qty 2

## 2020-12-09 MED ORDER — SUCRALFATE 1 G PO TABS
1.0000 g | ORAL_TABLET | Freq: Once | ORAL | Status: AC
  Administered 2020-12-09: 1 g via ORAL
  Filled 2020-12-09: qty 1

## 2020-12-09 NOTE — ED Provider Notes (Signed)
[facility] Emergency Department Provider Note  ____________________________________________  Time seen: Approximately 1:28 PM  I have reviewed the triage vital signs and the nursing notes.   HISTORY  Chief Complaint Abdominal Pain    HPI Kathe Becton Doane is a 50 y.o. female with a history of chronic low back pain hidradenitis and seizure disorder who comes ED complaining of epigastric pain for the last 2 weeks, waxing and waning, worse with spicy and fatty foods.  No alleviating factors.  Moderate intensity.  Nonradiating.  Associated vomiting.  Also reports specks of blood in her emesis and jet black stool.  No chest pain shortness of breath dizziness or syncope.  Also took a course of prednisone recently.  No NSAID use.      Past Medical History:  Diagnosis Date  . Chronic lower back pain   . Hydradenitis   . Seizures [facility])      Patient Active Problem List   Diagnosis Date Noted  . History of psychosis 12/01/2020  . History of suicide attempt 12/01/2020  . 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  . Tobacco use disorder 03/13/2016  . Cannabis use disorder, moderate, dependence (HCC) 03/13/2016  . Bipolar affective disorder, manic, severe, with psychotic behavior (HCC) 03/12/2016  . Seizures (HCC) 03/11/2016  . Involuntary commitment 03/11/2016     Past Surgical History:  Procedure Laterality Date  . DILATION AND CURETTAGE OF UTERUS     3 miscarriages  . HYDRADENITIS EXCISION     R underarm  . TUBAL LIGATION    . TUMOR REMOVAL  07/2020   index finger on L hand     Prior to Admission medications   Medication Sig Start Date End Date Taking? Authorizing Provider  famotidine (PEPCID) 20 MG tablet Take 1 tablet (20 mg total) by mouth 2 (two) times daily. 12/09/20  Yes Sharman Cheek, MD  metoCLOPramide (REGLAN) 10 MG tablet Take 1 tablet (10 mg total) by mouth every 6 (six) hours as needed. 12/09/20  Yes Sharman Cheek, MD  sucralfate (CARAFATE) 1 g tablet Take 1 tablet (1 g total) by mouth 4 (four) times daily. 12/09/20  Yes Sharman Cheek, MD  HYDROcodone-acetaminophen (NORCO/VICODIN) 5-325 MG tablet Take 1 tablet by mouth every 6 (six) hours as needed. Patient not taking: Reported on 12/01/2020 09/28/20   Tommi Rumps, PA-C  phenytoin (DILANTIN) 100 MG ER capsule Take 1 capsule (100 mg total) by mouth 3 (three) times daily. 12/01/20   McElwee, Jake Church, NP  predniSONE (DELTASONE) 10 MG tablet Take 6 tablets today, then 5 tablets tomorrow, then decrease by 1 tablet every day until gone 12/01/20   McElwee, Lauren A, NP  omeprazole (PRILOSEC OTC) 20 MG tablet Take 1 tablet (20 mg total) by mouth daily. 11/29/19 08/16/20  Loleta Rose, MD     Allergies Citrus, Sulfamethoxazole-trimethoprim, Butorphanol, Butorphanol tartrate, Contrast media [iodinated diagnostic agents], Food, Ibuprofen, Ketorolac, Lorazepam, Naproxen, Prochlorperazine, and Nsaids   Family History  Problem Relation Age of Onset  . Seizures Mother   . Diabetes Mother   . Heart attack Father   . Hypertension Brother   . Eczema Daughter   . Anxiety disorder Maternal Grandmother   . Hypertension Brother   . Hypertension Brother   . Hypertension Brother   . Hypertension Brother     Social History Social History   Tobacco Use  . Smoking status: Current Every Day Smoker    Packs/day: 0.50    Types: Cigarettes  .  Smokeless tobacco: Never Used  Vaping Use  . Vaping Use: Never used  Substance Use Topics  . Alcohol use: No  . Drug use: No    Review of Systems  Constitutional:   No fever or chills.  ENT:   No sore throat. No rhinorrhea. Cardiovascular:   No chest pain or syncope. Respiratory:   No dyspnea or cough. Gastrointestinal:   Abdominal pain as above with vomiting..  Musculoskeletal: Chronic low back pain. All other systems reviewed and are negative except as documented above in ROS and  HPI.  ____________________________________________   PHYSICAL EXAM:  VITAL SIGNS: ED Triage Vitals  Enc Vitals Group     BP 12/09/20 0839 (!) 150/99     Pulse Rate 12/09/20 0852 (!) 107     Resp 12/09/20 0852 18     Temp 12/09/20 0839 99 F (37.2 C)     Temp src --      SpO2 12/09/20 0839 99 %     Weight 12/09/20 0852 180 lb 12.4 oz (82 kg)     Height 12/09/20 0852 5\' 4"  (1.626 m)     Head Circumference --      Peak Flow --      Pain Score 12/09/20 0852 10     Pain Loc --      Pain Edu? --      Excl. in GC? --     Vital signs reviewed, nursing assessments reviewed.   Constitutional:   Alert and oriented. Non-toxic appearance. Eyes:   Conjunctivae are normal. EOMI. PERRL. ENT      Head:   Normocephalic and atraumatic.      Nose:   Wearing a mask.      Mouth/Throat:   Wearing a mask.      Neck:   No meningismus. Full ROM. Hematological/Lymphatic/Immunilogical:   No cervical lymphadenopathy. Cardiovascular:   RRR. Symmetric bilateral radial and DP pulses.  No murmurs. Cap refill less than 2 seconds. Respiratory:   Normal respiratory effort without tachypnea/retractions. Breath sounds are clear and equal bilaterally. No wheezes/rales/rhonchi. Gastrointestinal:   Soft with epigastric and left upper quadrant tenderness. Non distended. There is no CVA tenderness.  No rebound, rigidity, or guarding.  Musculoskeletal:   Normal range of motion in all extremities. No joint effusions.  No lower extremity tenderness.  No edema. Neurologic:   Normal speech and language.  Motor grossly intact. No acute focal neurologic deficits are appreciated.  Skin:    Skin is warm, dry and intact. No rash noted.  No petechiae, purpura, or bullae.  ____________________________________________    LABS (pertinent positives/negatives) (all labs ordered are listed, but only abnormal results are displayed) Labs Reviewed  COMPREHENSIVE METABOLIC PANEL - Abnormal; Notable for the following  components:      Result Value   Sodium 134 (*)    Glucose, Bld 166 (*)    All other components within normal limits  LIPASE, BLOOD  CBC WITH DIFFERENTIAL/PLATELET   ____________________________________________   EKG    ____________________________________________    RADIOLOGY  12/11/20 ABDOMEN LIMITED RUQ (LIVER/GB)  Result Date: 12/09/2020 CLINICAL DATA:  Right upper quadrant pain. EXAM: ULTRASOUND ABDOMEN LIMITED RIGHT UPPER QUADRANT COMPARISON:  Right upper quadrant ultrasound, 08/16/2020. FINDINGS: Gallbladder: No gallstones or wall thickening visualized. No sonographic Murphy sign noted by sonographer. Common bile duct: Diameter: 1-2 mm. Liver: Slight increase in the overall parenchymal echogenicity, also mildly heterogeneous. Normal liver size. No mass or focal lesion. Portal vein is patent on color Doppler  imaging with normal direction of blood flow towards the liver. Other: None. IMPRESSION: 1. No acute finding.  Normal gallbladder.  No bile duct dilation. 2. Slight increase in liver parenchymal echogenicity suggests hepatic steatosis. Electronically Signed   By: Amie [name] M.D.   On: 12/09/2020 11:11    ____________________________________________   PROCEDURES Procedures  ____________________________________________  DIFFERENTIAL DIAGNOSIS   Cholecystitis, pancreatitis, choledocholithiasis, gastritis, GERD  CLINICAL IMPRESSION / ASSESSMENT AND PLAN / ED COURSE  Medications ordered in the ED: Medications  sodium chloride 0.9 % bolus 1,000 mL (0 mLs Intravenous Stopped 12/09/20 1132)  ondansetron (ZOFRAN) injection 4 mg (4 mg Intravenous Given 12/09/20 0910)  pantoprazole (PROTONIX) injection 40 mg (40 mg Intravenous Given 12/09/20 0910)  sucralfate (CARAFATE) tablet 1 g (1 g Oral Given 12/09/20 1111)  famotidine (PEPCID) tablet 40 mg (40 mg Oral Given 12/09/20 1111)  metoCLOPramide (REGLAN) injection 10 mg (10 mg Intravenous Given 12/09/20 1111)    Pertinent labs &  imaging results that were available during my care of the patient were reviewed by me and considered in my medical decision making (see chart for details).  [name] Dura was evaluated in Emergency Department on 12/09/2020 for the symptoms described in the history of present illness. She was evaluated in the context of the global COVID-19 pandemic, which necessitated consideration that the patient might be at risk for infection with the SARS-CoV-2 virus that causes COVID-19. Institutional protocols and algorithms that pertain to the evaluation of patients at risk for COVID-19 are in a state of rapid change based on information released by regulatory bodies including the CDC and federal and state organizations. These policies and algorithms were followed during the patient's care in the ED.   Patient presents with upper abdominal pain, epigastric tenderness.  Vital signs are normal, labs are normal, ultrasound is normal.  Patient symptoms resolved after taking Carafate, and she walked to the hospital cafeteria, ate a full lunch, and then returned to the treatment room.  Presentation is consistent with GERD/gastritis.  We will keep her on Carafate, Pepcid, Reglan, follow-up with primary care.      ____________________________________________   FINAL CLINICAL IMPRESSION(S) / ED DIAGNOSES    Final diagnoses:  Acute gastritis without hemorrhage, unspecified gastritis type     ED Discharge Orders         Ordered    sucralfate (CARAFATE) 1 g tablet  4 times daily        12/09/20 1328    famotidine (PEPCID) 20 MG tablet  2 times daily        12/09/20 1328    metoCLOPramide (REGLAN) 10 MG tablet  Every 6 hours PRN        12/09/20 1328          Portions of this note were generated with dragon dictation software. Dictation errors may occur despite best attempts at proofreading.   Sharman Cheek, MD 12/09/20 [phone]

## 2020-12-09 NOTE — ED Notes (Signed)
Able to eat and drink without difficulties.

## 2020-12-09 NOTE — ED Triage Notes (Signed)
Reports stabbing epigastric pain and lower back pain X 2 weeks. Reports small "specs of blood" in her emesis this AM. States emesis every few days. Pt alert and oriented X4, cooperative, RR even and unlabored, color WNL. Pt in NAD.

## 2020-12-09 NOTE — ED Notes (Signed)
US at bedside

## 2020-12-09 NOTE — ED Notes (Signed)
Pt told staff that she was going to cafeteria, RN informed after fact. Pt left belongings in room.

## 2020-12-09 NOTE — ED Notes (Signed)
EDP at bedside  

## 2020-12-09 NOTE — ED Notes (Signed)
Pt back from cafeteria; able to tolerate PO. Pt alert and oriented X4, cooperative, RR even and unlabored, color WNL. Pt in NAD. States she is ready to leave.

## 2020-12-09 NOTE — ED Notes (Signed)
Pt alert and oriented X 4, stable for discharge. RR even and unlabored, color WNL. Discussed discharge instructions and follow-up as directed. Discharge medications discussed if prescribed. Pt had opportunity to ask questions, and RN to provide patient/family eduction.  

## 2020-12-31 ENCOUNTER — Ambulatory Visit (INDEPENDENT_AMBULATORY_CARE_PROVIDER_SITE_OTHER): Payer: Medicaid Other | Admitting: Nurse Practitioner

## 2020-12-31 ENCOUNTER — Encounter: Payer: Self-pay | Admitting: Nurse Practitioner

## 2020-12-31 ENCOUNTER — Other Ambulatory Visit: Payer: Self-pay

## 2020-12-31 ENCOUNTER — Other Ambulatory Visit (HOSPITAL_COMMUNITY)
Admission: RE | Admit: 2020-12-31 | Discharge: 2020-12-31 | Disposition: A | Discharge status: Home / Self Care | Payer: Medicaid Other | Source: Ambulatory Visit | Attending: Nurse Practitioner | Admitting: Nurse Practitioner

## 2020-12-31 VITALS — BP 121/83 | HR 105 | Temp 98.4°F | Ht 63.1 in | Wt 177.2 lb

## 2020-12-31 DIAGNOSIS — M5441 Lumbago with sciatica, right side: Secondary | ICD-10-CM

## 2020-12-31 DIAGNOSIS — Z1211 Encounter for screening for malignant neoplasm of colon: Secondary | ICD-10-CM

## 2020-12-31 DIAGNOSIS — M5442 Lumbago with sciatica, left side: Secondary | ICD-10-CM

## 2020-12-31 DIAGNOSIS — R569 Unspecified convulsions: Secondary | ICD-10-CM | POA: Diagnosis not present

## 2020-12-31 DIAGNOSIS — Z0000 Encounter for general adult medical examination without abnormal findings: Secondary | ICD-10-CM

## 2020-12-31 DIAGNOSIS — Z136 Encounter for screening for cardiovascular disorders: Secondary | ICD-10-CM | POA: Diagnosis not present

## 2020-12-31 DIAGNOSIS — R350 Frequency of micturition: Secondary | ICD-10-CM | POA: Diagnosis not present

## 2020-12-31 DIAGNOSIS — R7301 Impaired fasting glucose: Secondary | ICD-10-CM | POA: Diagnosis not present

## 2020-12-31 DIAGNOSIS — G8929 Other chronic pain: Secondary | ICD-10-CM

## 2020-12-31 DIAGNOSIS — F17200 Nicotine dependence, unspecified, uncomplicated: Secondary | ICD-10-CM

## 2020-12-31 LAB — BAYER DCA HB A1C WAIVED: HB A1C (BAYER DCA - WAIVED): 5.4 % (ref <7.0)

## 2020-12-31 LAB — WET PREP FOR TRICH, YEAST, CLUE
Clue Cell Exam: NEGATIVE
Trichomonas Exam: NEGATIVE
Yeast Exam: NEGATIVE

## 2020-12-31 MED ORDER — NICOTINE 14 MG/24HR TD PT24: 14.0000 mg | MEDICATED_PATCH | Freq: Every day | TRANSDERMAL | 0 refills | Status: DC

## 2020-12-31 MED ORDER — DULOXETINE HCL 30 MG PO CPEP: 30.0000 mg | ORAL_CAPSULE | Freq: Every day | ORAL | 1 refills | Status: DC

## 2020-12-31 NOTE — Progress Notes (Signed)
BP 121/83   Pulse (!) 105   Temp 98.4 F (36.9 C) (Oral)   Ht 5' 3.1" (1.603 m)   Wt 177 lb 3.2 oz (80.4 kg)   LMP 12/10/2020 (Approximate)   SpO2 96%   BMI 31.29 kg/m    Subjective:    Patient ID: [name], female    DOB: [birth year], 50 y.o.   MRN: [id number]   Chief Complaint  Patient presents with  . Annual Exam     HPI: Karina Anderson is a 50 y.o. female presenting on 12/31/2020 for comprehensive medical examination. Current medical complaints include:chronic back pain   BACK PAIN  Kenalog IM helped her pain for about 2 days last month. She took some of the prednisone, but had to stop because it was hurting her stomach. She is having continued pain and hasn't heard from the pain management referral. Pain is currently a 10/10 and described as sharp. The pain radiates down both legs. Denies urinary or stool incontinence. She has been taking tylenol which gives minimal relief.   She currently lives with: daughter Menopausal Symptoms: no  Depression Screen done today and results listed below:  Depression screen [facility] 2/9 12/31/2020 12/01/2020  Decreased Interest 0 0  Down, Depressed, Hopeless 0 0  PHQ - 2 Score 0 0  Altered sleeping - 0  Tired, decreased energy - 0  Change in appetite - 0  Feeling bad or failure about yourself  - 0  Trouble concentrating - 0  Moving slowly or fidgety/restless - 0  Suicidal thoughts - 0  PHQ-9 Score - 0  Difficult doing work/chores - Not difficult at all    The patient does not have a history of falls. I did not complete a risk assessment for falls. A plan of care for falls was not documented.   Past Medical History:  Past Medical History:  Diagnosis Date  . Chronic lower back pain   . Hydradenitis   . Seizures [facility])     Surgical History:  Past Surgical History:  Procedure Laterality Date  . DILATION AND CURETTAGE OF UTERUS     3 miscarriages  . HYDRADENITIS EXCISION     R underarm  . TUBAL LIGATION    . TUMOR REMOVAL   07/2020   index finger on L hand    Medications:  Current Outpatient Medications on File Prior to Visit  Medication Sig  . famotidine (PEPCID) 20 MG tablet Take 1 tablet (20 mg total) by mouth 2 (two) times daily.  . metoCLOPramide (REGLAN) 10 MG tablet Take 1 tablet (10 mg total) by mouth every 6 (six) hours as needed.  . phenytoin (DILANTIN) 100 MG ER capsule Take 1 capsule (100 mg total) by mouth 3 (three) times daily.  . sucralfate (CARAFATE) 1 g tablet Take 1 tablet (1 g total) by mouth 4 (four) times daily.  Marland Kitchen HYDROcodone-acetaminophen (NORCO/VICODIN) 5-325 MG tablet Take 1 tablet by mouth every 6 (six) hours as needed. (Patient not taking: No sig reported)  . [DISCONTINUED] omeprazole (PRILOSEC OTC) 20 MG tablet Take 1 tablet (20 mg total) by mouth daily.   No current facility-administered medications on file prior to visit.    Allergies:  Allergies  Allergen Reactions  . Citrus Hives  . Sulfamethoxazole-Trimethoprim Hives  . Butorphanol Hives  . Butorphanol Tartrate Hives  . Contrast Media [Iodinated Diagnostic Agents] Nausea And Vomiting  . Food Other (See Comments)    Allergic to Citrus Fruits.  . Ibuprofen   . Ketorolac   .  Lorazepam Hives  . Naproxen   . Prochlorperazine Hives  . Nsaids Rash    Social History:  Social History   Socioeconomic History  . Marital status: Single    Spouse name: Not on file  . Number of children: Not on file  . Years of education: Not on file  . Highest education level: Not on file  Occupational History  . Not on file  Tobacco Use  . Smoking status: Current Every Day Smoker    Packs/day: 0.50    Types: Cigarettes  . Smokeless tobacco: Never Used  Vaping Use  . Vaping Use: Never used  Substance and Sexual Activity  . Alcohol use: No  . Drug use: No  . Sexual activity: Not Currently  Other Topics Concern  . Not on file  Social History Narrative  . Not on file   Social Determinants of Health   Financial Resource  Strain: Not on file  Food Insecurity: Not on file  Transportation Needs: Not on file  Physical Activity: Not on file  Stress: Not on file  Social Connections: Not on file  Intimate Partner Violence: Not on file   Social History   Tobacco Use  Smoking Status Current Every Day Smoker  . Packs/day: 0.50  . Types: Cigarettes  Smokeless Tobacco Never Used   Social History   Substance and Sexual Activity  Alcohol Use No    Family History:  Family History  Problem Relation Age of Onset  . Seizures Mother   . Diabetes Mother   . Heart attack Father   . Hypertension Brother   . Eczema Daughter   . Anxiety disorder Maternal Grandmother   . Hypertension Brother   . Hypertension Brother   . Hypertension Brother   . Hypertension Brother     Past medical history, surgical history, medications, allergies, family history and social history reviewed with patient today and changes made to appropriate areas of the chart.   Review of Systems  Constitutional: Negative.   HENT: Negative.   Eyes: Negative.   Respiratory: Negative.   Cardiovascular: Negative.   Gastrointestinal: Negative.   Genitourinary: Positive for frequency.  Musculoskeletal: Positive for back pain.  Skin: Negative.   Neurological: Negative.   Psychiatric/Behavioral: Negative.    All other ROS negative except what is listed above and in the HPI.      Objective:    BP 121/83   Pulse (!) 105   Temp 98.4 F (36.9 C) (Oral)   Ht 5' 3.1" (1.603 m)   Wt 177 lb 3.2 oz (80.4 kg)   LMP 12/10/2020 (Approximate)   SpO2 96%   BMI 31.29 kg/m   Wt Readings from Last 3 Encounters:  12/31/20 177 lb 3.2 oz (80.4 kg)  12/09/20 180 lb 12.4 oz (82 kg)  12/01/20 179 lb 9.6 oz (81.5 kg)    Physical Exam Vitals and nursing note reviewed. Exam conducted with a chaperone present.  Constitutional:      General: She is not in acute distress.    Appearance: Normal appearance.  HENT:     Head: Normocephalic and  atraumatic.     Right Ear: Tympanic membrane, ear canal and external ear normal.     Left Ear: Tympanic membrane, ear canal and external ear normal.     Nose: Nose normal.     Mouth/Throat:     Mouth: Mucous membranes are moist.     Pharynx: Oropharynx is clear.  Eyes:     Conjunctiva/sclera: Conjunctivae normal.  Cardiovascular:     Rate and Rhythm: Normal rate and regular rhythm.     Pulses: Normal pulses.     Heart sounds: Normal heart sounds.  Pulmonary:     Effort: Pulmonary effort is normal.     Breath sounds: Normal breath sounds.  Chest:  Breasts:     Right: Normal. No mass, tenderness, axillary adenopathy or supraclavicular adenopathy.     Left: Normal. No mass, tenderness, axillary adenopathy or supraclavicular adenopathy.    Abdominal:     General: Bowel sounds are normal.     Palpations: Abdomen is soft.     Tenderness: There is no abdominal tenderness.  Genitourinary:    General: Normal vulva.     Vagina: Vaginal discharge present.  Musculoskeletal:        General: Normal range of motion.     Cervical back: Normal range of motion and neck supple.  Lymphadenopathy:     Cervical: No cervical adenopathy.     Upper Body:     Right upper body: No supraclavicular, axillary or pectoral adenopathy.     Left upper body: No supraclavicular, axillary or pectoral adenopathy.  Skin:    General: Skin is warm and dry.  Neurological:     General: No focal deficit present.     Mental Status: She is alert and oriented to person, place, and time.     Cranial Nerves: No cranial nerve deficit.     Coordination: Coordination normal.     Gait: Gait normal.  Psychiatric:        Mood and Affect: Mood normal.        Behavior: Behavior normal.        Thought Content: Thought content normal.        Judgment: Judgment normal.     Results for orders placed or performed during the hospital encounter of 12/09/20  Comprehensive metabolic panel  Result Value Ref Range   Sodium 134  (L) 135 - 145 mmol/L   Potassium 3.9 3.5 - 5.1 mmol/L   Chloride 103 98 - 111 mmol/L   CO2 22 22 - 32 mmol/L   Glucose, Bld 166 (H) 70 - 99 mg/dL   BUN 16 6 - 20 mg/dL   Creatinine, Ser 0.65 0.44 - 1.00 mg/dL   Calcium 9.2 8.9 - 10.3 mg/dL   Total Protein 7.8 6.5 - 8.1 g/dL   Albumin 3.8 3.5 - 5.0 g/dL   AST 18 15 - 41 U/L   ALT 15 0 - 44 U/L   Alkaline Phosphatase 68 38 - 126 U/L   Total Bilirubin 0.5 0.3 - 1.2 mg/dL   GFR, Estimated >60 >60 mL/min   Anion gap 9 5 - 15  Lipase, blood  Result Value Ref Range   Lipase 21 11 - 51 U/L  CBC with Differential  Result Value Ref Range   WBC 9.4 4.0 - 10.5 K/uL   RBC 4.49 3.87 - 5.11 MIL/uL   Hemoglobin 12.4 12.0 - 15.0 g/dL   HCT 37.0 36.0 - 46.0 %   MCV 82.4 80.0 - 100.0 fL   MCH 27.6 26.0 - 34.0 pg   MCHC 33.5 30.0 - 36.0 g/dL   RDW 15.4 11.5 - 15.5 %   Platelets 326 150 - 400 K/uL   nRBC 0.0 0.0 - 0.2 %   Neutrophils Relative % 80 %   Neutro Abs 7.5 1.7 - 7.7 K/uL   Lymphocytes Relative 15 %   Lymphs Abs 1.4 0.7 - 4.0  K/uL   Monocytes Relative 4 %   Monocytes Absolute 0.3 0.1 - 1.0 K/uL   Eosinophils Relative 0 %   Eosinophils Absolute 0.0 0.0 - 0.5 K/uL   Basophils Relative 0 %   Basophils Absolute 0.0 0.0 - 0.1 K/uL   Immature Granulocytes 1 %   Abs Immature Granulocytes 0.05 0.00 - 0.07 K/uL      Assessment & Plan:   Problem List Items Addressed This Visit      Endocrine   Impaired fasting glucose   Relevant Orders   Bayer DCA Hb A1c Waived     Nervous and Auditory   Chronic midline low back pain with bilateral sciatica    Will follow-up on pain management referral. Has tried gabapentin in the past and said it made her feel like a zombie. Will start cymbalta 3m daily. If develop any SI/HI call our office or go to the ER. Follow-up in 4 weeks.      Relevant Medications   DULoxetine (CYMBALTA) 30 MG capsule   nicotine (NICODERM CQ) 14 mg/24hr patch     Other   Seizures (HCC)    Chronic, stable. Has been  taking her dilantin as ordered since last visit. Will recheck dilantin level today.      Relevant Orders   Dilantin (Phenytoin) level, total   Tobacco use disorder    Smokes about 10 cigarettes per day. She is interested in tobacco cessation. She has used nicoderm patches in the past which have helped. Prescription sent in for nicoderm patch. Follow-up in 4 weeks.        Other Visit Diagnoses    Routine general medical examination at a health care facility    -  Primary   Health maintenance reviewed. Pap done today. Check tsh today. CMP and CBC done last month.    Relevant Orders   TSH   Cytology - PAP   Encounter for screening for cardiovascular disorders       check lipid panel today   Relevant Orders   Lipid Panel w/o Chol/HDL Ratio   Screening for colon cancer       Hemoccult kit ordered today   Relevant Orders   Fecal occult blood, imunochemical   Urinary frequency       check U/A and wet prep today. discussed pelvic floor exercises. Follow-up if symptoms don't improve or worsen.   Relevant Orders   WET PREP FOR TBishop YEAST, CLUE   Urinalysis, Routine w reflex microscopic       Follow up plan: Return in about 4 weeks (around 01/28/2021) for back pain.   LABORATORY TESTING:  - Pap smear: pap done  IMMUNIZATIONS:   - Tdap: Tetanus vaccination status reviewed: last tetanus booster within 10 years. - Influenza: Refused - Pneumovax: Refused - Prevnar: Not applicable  - HPV: Not applicable - Zostavax vaccine: Not applicable  SCREENING: -Mammogram: Not applicable  - Colonoscopy: declined colonscopy and cologuard, fecal occult ordered  - Bone Density: Not applicable  -Hearing Test: Not applicable  -Spirometry: Not applicable   PATIENT COUNSELING:   Advised to take 1 mg of folate supplement per day if capable of pregnancy.   Sexuality: Discussed sexually transmitted diseases, partner selection, use of condoms, avoidance of unintended pregnancy  and contraceptive  alternatives.   Advised to avoid cigarette smoking.  I discussed with the patient that most people either abstain from alcohol or drink within safe limits (<=14/week and <=4 drinks/occasion for males, <=7/weeks and <= 3 drinks/occasion for  females) and that the risk for alcohol disorders and other health effects rises proportionally with the number of drinks per week and how often a drinker exceeds daily limits.  Discussed cessation/primary prevention of drug use and availability of treatment for abuse.   Diet: Encouraged to adjust caloric intake to maintain  or achieve ideal body weight, to reduce intake of dietary saturated fat and total fat, to limit sodium intake by avoiding high sodium foods and not adding table salt, and to maintain adequate dietary potassium and calcium preferably from fresh fruits, vegetables, and low-fat dairy products.    stressed the importance of regular exercise  Injury prevention: Discussed safety belts, safety helmets, smoke detector, smoking near bedding or upholstery.   Dental health: Discussed importance of regular tooth brushing, flossing, and dental visits.    NEXT PREVENTATIVE PHYSICAL DUE IN 1 YEAR. Return in about 4 weeks (around 01/28/2021) for back pain.

## 2020-12-31 NOTE — Assessment & Plan Note (Signed)
Chronic, stable. Has been taking her dilantin as ordered since last visit. Will recheck dilantin level today.

## 2020-12-31 NOTE — Assessment & Plan Note (Signed)
Will follow-up on pain management referral. Has tried gabapentin in the past and said it made her feel like a zombie. Will start cymbalta 30mg  daily. If develop any SI/HI call our office or go to the ER. Follow-up in 4 weeks.

## 2020-12-31 NOTE — Assessment & Plan Note (Signed)
Smokes about 10 cigarettes per day. She is interested in tobacco cessation. She has used nicoderm patches in the past which have helped. Prescription sent in for nicoderm patch. Follow-up in 4 weeks.

## 2020-12-31 NOTE — Patient Instructions (Signed)
Health Maintenance After Age 49 After age 49, you are at a higher risk for certain long-term diseases and infections as well as injuries from falls. Falls are a major cause of broken bones and head injuries in people who are older than age 49. Getting regular preventive care can help to keep you healthy and well. Preventive care includes getting regular testing and making lifestyle changes as recommended by your health care provider. Talk with your health care provider about:  Which screenings and tests you should have. A screening is a test that checks for a disease when you have no symptoms.  A diet and exercise plan that is right for you. What should I know about screenings and tests to prevent falls? Screening and testing are the best ways to find a health problem early. Early diagnosis and treatment give you the best chance of managing medical conditions that are common after age 49. Certain conditions and lifestyle choices may make you more likely to have a fall. Your health care provider may recommend:  Regular vision checks. Poor vision and conditions such as cataracts can make you more likely to have a fall. If you wear glasses, make sure to get your prescription updated if your vision changes.  Medicine review. Work with your health care provider to regularly review all of the medicines you are taking, including over-the-counter medicines. Ask your health care provider about any side effects that may make you more likely to have a fall. Tell your health care provider if any medicines that you take make you feel dizzy or sleepy.  Osteoporosis screening. Osteoporosis is a condition that causes the bones to get weaker. This can make the bones weak and cause them to break more easily.  Blood pressure screening. Blood pressure changes and medicines to control blood pressure can make you feel dizzy.  Strength and balance checks. Your health care provider may recommend certain tests to check your  strength and balance while standing, walking, or changing positions.  Foot health exam. Foot pain and numbness, as well as not wearing proper footwear, can make you more likely to have a fall.  Depression screening. You may be more likely to have a fall if you have a fear of falling, feel emotionally low, or feel unable to do activities that you used to do.  Alcohol use screening. Using too much alcohol can affect your balance and may make you more likely to have a fall. What actions can I take to lower my risk of falls? General instructions  Talk with your health care provider about your risks for falling. Tell your health care provider if: ? You fall. Be sure to tell your health care provider about all falls, even ones that seem minor. ? You feel dizzy, sleepy, or off-balance.  Take over-the-counter and prescription medicines only as told by your health care provider. These include any supplements.  Eat a healthy diet and maintain a healthy weight. A healthy diet includes low-fat dairy products, low-fat (lean) meats, and fiber from whole grains, beans, and lots of fruits and vegetables. Home safety  Remove any tripping hazards, such as rugs, cords, and clutter.  Install safety equipment such as grab bars in bathrooms and safety rails on stairs.  Keep rooms and walkways well-lit. Activity  Follow a regular exercise program to stay fit. This will help you maintain your balance. Ask your health care provider what types of exercise are appropriate for you.  If you need a cane or walker,   use it as recommended by your health care provider.  Wear supportive shoes that have nonskid soles.   Lifestyle  Do not drink alcohol if your health care provider tells you not to drink.  If you drink alcohol, limit how much you have: ? 0-1 drink a day for women. ? 0-2 drinks a day for men.  Be aware of how much alcohol is in your drink. In the [place] one drink equals one typical bottle of beer (12  oz), one-half glass of wine (5 oz), or one shot of hard liquor (1 oz).  Do not use any products that contain nicotine or tobacco, such as cigarettes and e-cigarettes. If you need help quitting, ask your health care provider. Summary  Having a healthy lifestyle and getting preventive care can help to protect your health and wellness after age 49.  Screening and testing are the best way to find a health problem early and help you avoid having a fall. Early diagnosis and treatment give you the best chance for managing medical conditions that are more common for people who are older than age 49.  Falls are a major cause of broken bones and head injuries in people who are older than age 49. Take precautions to prevent a fall at home.  Work with your health care provider to learn what changes you can make to improve your health and wellness and to prevent falls. This information is not intended to replace advice given to you by your health care provider. Make sure you discuss any questions you have with your health care provider. Document Revised: 12/06/2018 Document Reviewed: 06/28/2017 Elsevier Patient Education  2021 Elsevier Inc.  

## 2021-01-01 ENCOUNTER — Telehealth: Payer: Self-pay | Admitting: Nurse Practitioner

## 2021-01-01 LAB — TSH: TSH: 1.79 u[IU]/mL (ref 0.450–4.500)

## 2021-01-01 LAB — LIPID PANEL W/O CHOL/HDL RATIO
Cholesterol, Total: 249 mg/dL — ABNORMAL HIGH (ref 100–199)
HDL: 49 mg/dL (ref >39)
LDL Chol Calc (NIH): 158 mg/dL — ABNORMAL HIGH (ref 0–99)
Triglycerides: 228 mg/dL — ABNORMAL HIGH (ref 0–149)
VLDL Cholesterol Cal: 42 mg/dL — ABNORMAL HIGH (ref 5–40)

## 2021-01-01 LAB — PHENYTOIN LEVEL, TOTAL: Phenytoin (Dilantin), Serum: 9 ug/mL — ABNORMAL LOW (ref 10.0–20.0)

## 2021-01-01 MED ORDER — DULOXETINE HCL 30 MG PO CPEP: 30.0000 mg | ORAL_CAPSULE | Freq: Every day | ORAL | 3 refills | Status: DC

## 2021-01-01 NOTE — Telephone Encounter (Signed)
Please advise 

## 2021-01-01 NOTE — Telephone Encounter (Addendum)
Pt seen lauren yesterday and does not want to go to [facility] in [place] to pick up duloxetine and does not want to call to have medication transferred. Pt would like rx to be sent to [facility] phone number [phone]

## 2021-01-01 NOTE — Telephone Encounter (Signed)
Routing to provider  

## 2021-01-01 NOTE — Telephone Encounter (Signed)
Can you send this to [facility]

## 2021-01-01 NOTE — Telephone Encounter (Signed)
Called patient, no answer left message with details of what provider instructed.

## 2021-01-01 NOTE — Telephone Encounter (Signed)
Pt called requesting potassium pills, she has cramps in the back of her legs. She failed to mention this during her appt yesterday, she has severe cramps when she goes to sleep. Please advise if appt is necessary  [id number] [place], [place] - [address] AT [facility] OF [address] [place]  [address] [place] [address]  Phone: [phone] Fax: [phone]

## 2021-01-04 ENCOUNTER — Telehealth: Payer: Self-pay

## 2021-01-04 LAB — CYTOLOGY - PAP
Adequacy: ABSENT
Diagnosis: NEGATIVE

## 2021-01-04 MED ORDER — METRONIDAZOLE 500 MG PO TABS: 500.0000 mg | ORAL_TABLET | Freq: Two times a day (BID) | ORAL | 0 refills | Status: DC

## 2021-01-04 NOTE — Addendum Note (Signed)
Addended by: Rodman Pickle A on: 01/04/2021 04:51 PM   Modules accepted: Orders

## 2021-01-04 NOTE — Telephone Encounter (Signed)
Called [facility] in [place], They stated that she is unable to pick up there because it is already ready at [facility] in [place],  [address]  Pharmacy cancelled the [facility]   LVM telling patient prescription is ready for pick up at [facility] in [place],

## 2021-01-04 NOTE — Telephone Encounter (Signed)
Incoming call from patient and insurance representative, patient was wanting to know why she continues to get generic medications. I explained that she will get what medication the insurance prefers.   Patient also said something about a stomach issue, but will go to the ER.  It was very hard to understand patient/

## 2021-01-15 ENCOUNTER — Ambulatory Visit: Payer: Self-pay

## 2021-01-15 NOTE — Telephone Encounter (Signed)
Patient called, no answer, mailbox is full.   Summary: gum pain   Patient is experiencing gum discomfort specifically on their bottom gums towards the back of their mouth   Patient has experienced the discomfort for roughly a week   Patient shares that aspirin and Advil have done nothing to help   Please contact to further advise when possible

## 2021-01-15 NOTE — Telephone Encounter (Signed)
Patient notified

## 2021-01-15 NOTE — Telephone Encounter (Signed)
Pt. Complaining of right lower jaw pain x 1 week. States "I saw someone who said it is not a tooth problem." Appointment made for Monday.Has swelling inside mouth. Asking if something for pain can be sent to pharmacy. Please advise. Reason for Disposition . [1] SEVERE pain (e.g., excruciating, unable to do any normal activities) AND [2] not improved 2 hours after pain medicine  Answer Assessment - Initial Assessment Questions 1. LOCATION: "Which tooth is hurting?"  (e.g., right-side/left-side, upper/lower, front/back)     Bottom jaw 2. ONSET: "When did the toothache start?"  (e.g., hours, days)      1 week ago 3. SEVERITY: "How bad is the toothache?"  (Scale 1-10; mild, moderate or severe)   - MILD (1-3): doesn't interfere with chewing    - MODERATE (4-7): interferes with chewing, interferes with normal activities, awakens from sleep     - SEVERE (8-10): unable to eat, unable to do any normal activities, excruciating pain        10 4. SWELLING: "Is there any visible swelling of your face?"     Inside 5. OTHER SYMPTOMS: "Do you have any other symptoms?" (e.g., fever)     No 6. PREGNANCY: "Is there any chance you are pregnant?" "When was your last menstrual period?"     No  Protocols used: TOOTHACHE-A-AH

## 2021-01-18 ENCOUNTER — Ambulatory Visit (INDEPENDENT_AMBULATORY_CARE_PROVIDER_SITE_OTHER): Payer: Medicaid Other | Admitting: Nurse Practitioner

## 2021-01-18 ENCOUNTER — Other Ambulatory Visit: Payer: Self-pay

## 2021-01-18 ENCOUNTER — Ambulatory Visit: Payer: Self-pay

## 2021-01-18 ENCOUNTER — Encounter: Payer: Self-pay | Admitting: Nurse Practitioner

## 2021-01-18 VITALS — BP 112/87 | HR 114 | Temp 98.0°F | Wt 174.2 lb

## 2021-01-18 DIAGNOSIS — K068 Other specified disorders of gingiva and edentulous alveolar ridge: Secondary | ICD-10-CM

## 2021-01-18 DIAGNOSIS — M5442 Lumbago with sciatica, left side: Secondary | ICD-10-CM | POA: Diagnosis not present

## 2021-01-18 DIAGNOSIS — M5441 Lumbago with sciatica, right side: Secondary | ICD-10-CM | POA: Diagnosis not present

## 2021-01-18 DIAGNOSIS — G8929 Other chronic pain: Secondary | ICD-10-CM

## 2021-01-18 MED ORDER — LIDOCAINE VISCOUS HCL 2 % MT SOLN: 5.0000 mL | Freq: Four times a day (QID) | OROMUCOSAL | 0 refills | Status: DC | PRN

## 2021-01-18 MED ORDER — AMOXICILLIN-POT CLAVULANATE 875-125 MG PO TABS: 1.0000 | ORAL_TABLET | Freq: Two times a day (BID) | ORAL | 0 refills | Status: DC

## 2021-01-18 NOTE — Progress Notes (Signed)
Acute Office Visit  Subjective:    Patient ID: Karina Anderson, female    DOB: November 19, 1970, 50 y.o.   MRN: [id number]  Chief Complaint  Patient presents with  . Jaw Pain    Patient states that jaw pain started last week on right side.    HPI Patient is in today for right jaw pain that started last week. She saw a dentist and said they took x-rays which were normal. She received a referral for a dental specialist for her gums, but lost the paper.   DENTAL PAIN  Duration: a week Involved teeth: right and lower Dentist evaluation: yes Mechanism of injury:  unknown Onset: sudden Severity: 10/10 Quality: dull, aching--feels like there is a cut in her mouth Frequency: all the time  Radiation: no Aggravating factors: chewing Alleviating factors: nothing Status: worse Treatments attempted: APAP and NSAIDs Relief with NSAIDs?: no Fevers: no Swelling: no Redness: no Paresthesias / decreased sensation: no Sinus pressure: no  LOWER BACK PAIN  Started cymbalta about 2 weeks ago. She noted an improvement in her back pain. She currently denies SI/HI and states that her mood is stable and has no concerns.   Past Medical History:  Diagnosis Date  . Chronic lower back pain   . Hydradenitis   . Seizures ([place])     Past Surgical History:  Procedure Laterality Date  . DILATION AND CURETTAGE OF UTERUS     3 miscarriages  . HYDRADENITIS EXCISION     R underarm  . TUBAL LIGATION    . TUMOR REMOVAL  07/2020   index finger on L hand    Family History  Problem Relation Age of Onset  . Seizures Mother   . Diabetes Mother   . Heart attack Father   . Hypertension Brother   . Eczema Daughter   . Anxiety disorder Maternal Grandmother   . Hypertension Brother   . Hypertension Brother   . Hypertension Brother   . Hypertension Brother     Social History   Socioeconomic History  . Marital status: Single    Spouse name: Not on file  . Number of children: Not on file  . Years  of education: Not on file  . Highest education level: Not on file  Occupational History  . Not on file  Tobacco Use  . Smoking status: Current Every Day Smoker    Packs/day: 0.50    Types: Cigarettes  . Smokeless tobacco: Never Used  Vaping Use  . Vaping Use: Never used  Substance and Sexual Activity  . Alcohol use: No  . Drug use: No  . Sexual activity: Not Currently  Other Topics Concern  . Not on file  Social History Narrative  . Not on file   Social Determinants of Health   Financial Resource Strain: Not on file  Food Insecurity: Not on file  Transportation Needs: Not on file  Physical Activity: Not on file  Stress: Not on file  Social Connections: Not on file  Intimate Partner Violence: Not on file    Outpatient Medications Prior to Visit  Medication Sig Dispense Refill  . DULoxetine (CYMBALTA) 30 MG capsule Take 1 capsule (30 mg total) by mouth daily. 30 capsule 3  . famotidine (PEPCID) 20 MG tablet Take 1 tablet (20 mg total) by mouth 2 (two) times daily. 60 tablet 0  . metoCLOPramide (REGLAN) 10 MG tablet Take 1 tablet (10 mg total) by mouth every 6 (six) hours as needed. 30 tablet 0  .  nicotine (NICODERM CQ) 14 mg/24hr patch Place 1 patch (14 mg total) onto the skin daily. 28 patch 0  . phenytoin (DILANTIN) 100 MG ER capsule Take 1 capsule (100 mg total) by mouth 3 (three) times daily. 90 capsule 2  . sucralfate (CARAFATE) 1 g tablet Take 1 tablet (1 g total) by mouth 4 (four) times daily. 120 tablet 1  . metroNIDAZOLE (FLAGYL) 500 MG tablet Take 1 tablet (500 mg total) by mouth 2 (two) times daily. 14 tablet 0  . HYDROcodone-acetaminophen (NORCO/VICODIN) 5-325 MG tablet Take 1 tablet by mouth every 6 (six) hours as needed. (Patient not taking: No sig reported) 8 tablet 0   No facility-administered medications prior to visit.    Allergies  Allergen Reactions  . Citrus Hives  . Sulfamethoxazole-Trimethoprim Hives  . Butorphanol Hives  . Butorphanol Tartrate  Hives  . Contrast Media [Iodinated Diagnostic Agents] Nausea And Vomiting  . Food Other (See Comments)    Allergic to Citrus Fruits.  . Ibuprofen   . Ketorolac   . Lorazepam Hives  . Naproxen   . Prochlorperazine Hives  . Nsaids Rash    Review of Systems  Constitutional: Negative for fatigue and fever.  HENT: Positive for dental problem.   Gastrointestinal: Negative.   Genitourinary: Negative.   Musculoskeletal: Positive for back pain (improving).  Neurological: Negative.   Psychiatric/Behavioral: Negative.        Objective:    Physical Exam Vitals and nursing note reviewed.  Constitutional:      General: She is not in acute distress.    Appearance: Normal appearance.  HENT:     Head: Normocephalic.     Right Ear: Tympanic membrane, ear canal and external ear normal.     Left Ear: Tympanic membrane, ear canal and external ear normal.     Mouth/Throat:     Mouth: Mucous membranes are moist.     Dentition: Dental tenderness and gingival swelling (back right, lower jaw) present.     Pharynx: Oropharynx is clear.  Pulmonary:     Effort: Pulmonary effort is normal.  Skin:    General: Skin is warm.  Neurological:     Mental Status: She is alert and oriented to person, place, and time.  Psychiatric:        Mood and Affect: Mood normal.        Behavior: Behavior normal.        Thought Content: Thought content normal.        Judgment: Judgment normal.     BP 112/87   Pulse (!) 114   Temp 98 F (36.7 C) (Oral)   Wt 174 lb 3.2 oz (79 kg)   SpO2 96%   BMI 30.76 kg/m  Wt Readings from Last 3 Encounters:  01/18/21 174 lb 3.2 oz (79 kg)  12/31/20 177 lb 3.2 oz (80.4 kg)  12/09/20 180 lb 12.4 oz (82 kg)    Health Maintenance Due  Topic Date Due  . COVID-19 Vaccine (1) Never done    There are no preventive care reminders to display for this patient.   Lab Results  Component Value Date   TSH 1.790 12/31/2020   Lab Results  Component Value Date   WBC 9.4  12/09/2020   HGB 12.4 12/09/2020   HCT 37.0 12/09/2020   MCV 82.4 12/09/2020   PLT 326 12/09/2020   Lab Results  Component Value Date   NA 134 (L) 12/09/2020   K 3.9 12/09/2020   CO2 22 12/09/2020  GLUCOSE 166 (H) 12/09/2020   BUN 16 12/09/2020   CREATININE 0.65 12/09/2020   BILITOT 0.5 12/09/2020   ALKPHOS 68 12/09/2020   AST 18 12/09/2020   ALT 15 12/09/2020   PROT 7.8 12/09/2020   ALBUMIN 3.8 12/09/2020   CALCIUM 9.2 12/09/2020   ANIONGAP 9 12/09/2020   EGFR 94 12/01/2020   Lab Results  Component Value Date   CHOL 249 (H) 12/31/2020   Lab Results  Component Value Date   HDL 49 12/31/2020   Lab Results  Component Value Date   LDLCALC 158 (H) 12/31/2020   Lab Results  Component Value Date   TRIG 228 (H) 12/31/2020   Lab Results  Component Value Date   CHOLHDL 4.7 03/12/2016   Lab Results  Component Value Date   HGBA1C 5.4 12/31/2020       Assessment & Plan:   Problem List Items Addressed This Visit      Nervous and Auditory   Chronic midline low back pain with bilateral sciatica    Improving with starting cymbalta 2 weeks ago. Currently no side effects, SI/HI.  Has a follow-up appointment scheduled in 2 weeks, will follow-up at that time.         Other   Pain in gums - Primary    Redness noted to right back lower jaw. Will treat with augmentin and magic mouthwash. She should call back the dentist that she saw to get the referral information for the gum specialist. Follow-up if symptoms worsen or do not improve.           Meds ordered this encounter  Medications  . amoxicillin-clavulanate (AUGMENTIN) 875-125 MG tablet    Sig: Take 1 tablet by mouth 2 (two) times daily.    Dispense:  14 tablet    Refill:  0  . magic mouthwash (lidocaine, diphenhydrAMINE, alum & mag hydroxide) suspension    Sig: Swish and spit 5 mLs 4 (four) times daily as needed for mouth pain.    Dispense:  360 mL    Refill:  0     Charyl Dancer, NP

## 2021-01-18 NOTE — Assessment & Plan Note (Signed)
Improving with starting cymbalta 2 weeks ago. Currently no side effects, SI/HI.  Has a follow-up appointment scheduled in 2 weeks, will follow-up at that time.

## 2021-01-18 NOTE — Telephone Encounter (Signed)
Patient called and says that she was in the office today and was given an antibiotic. She says when she took one dose it gave her a migraine headache. She says she took excedrin migraine. She says the pharmacist told her that the mouth wash is not covered by medicaid. She asked what does she need to do. I advised not to take any more of the antibiotic and Lauren will review this note tomorrow, someone will call with her recommendation. Patient verbalized understanding.  Reason for Disposition . [1] Caller has NON-URGENT medicine question about med that PCP prescribed AND [2] triager unable to answer question  Answer Assessment - Initial Assessment Questions 1. NAME of MEDICATION: "What medicine are you calling about?"     Amoxicillin given today 2. QUESTION: "What is your question?" (e.g., double dose of medicine, side effect)     It's giving me a headache 3. PRESCRIBING HCP: "Who prescribed it?" Reason: if prescribed by specialist, call should be referred to that group.     Rodman Pickle, NP 4. SYMPTOMS: "Do you have any symptoms?"     Migraine headache 5. SEVERITY: If symptoms are present, ask "Are they mild, moderate or severe?"     Severe-took excedrin 6. PREGNANCY:  "Is there any chance that you are pregnant?" "When was your last menstrual period?"     N/A  Protocols used: MEDICATION QUESTION CALL-A-AH

## 2021-01-18 NOTE — Assessment & Plan Note (Signed)
Redness noted to right back lower jaw. Will treat with augmentin and magic mouthwash. She should call back the dentist that she saw to get the referral information for the gum specialist. Follow-up if symptoms worsen or do not improve.

## 2021-01-18 NOTE — Patient Instructions (Signed)
Dental Pain Dental pain is often a sign that something is wrong with your teeth or gums. You can also have pain after a dental treatment. If you have dental pain, it is important to contact your dentist, especially if the cause of the pain is not known. Dental pain may hurt a lot or a little and can be caused by many things, including:  Tooth decay (cavities or caries).  Infection.  The inner part of the tooth being filled with pus (an abscess).  Injury.  A crack in the tooth.  Gums that move back and expose the root of a tooth.  Gum disease.  Abnormal grinding or clenching of teeth.  Not taking good care of your teeth. Sometimes the cause of pain is not known. You may have pain all the time, or it may happen only when you are:  Chewing.  Exposed to hot or cold temperatures.  Eating or drinking foods or drinks that have a lot of sugar in them, such as soda or candy. Follow these instructions at home: Medicines  Take over-the-counter and prescription medicines only as told by your dentist.  If you were prescribed an antibiotic medicine, take it as told by your dentist. Do not stop taking it even if you start to feel better. Eating and drinking Do not eat foods or drinks that cause you pain. These include:  Very hot or very cold foods or drinks.  Sweet or sugary foods or drinks. Managing pain and swelling  If told, put ice on the painful area of your face. To do this: ? Put ice in a plastic bag. ? Place a towel between your skin and the bag. ? Leave the ice on for 20 minutes, 2-3 times a day. ? Take off the ice if your skin turns bright red. This is very important. If you cannot feel pain, heat, or cold, you have a greater risk of damage to the area.   Brushing your teeth  Brush your teeth twice a day using a fluoride toothpaste.  Use a toothpaste made for sensitive teeth as told by your dentist.  Use a soft toothbrush. General instructions  Floss your teeth at  least once a day.  Do not put heat on the outside of your face.  Rinse your mouth often with salt water. To make salt water, dissolve -1 tsp (3-6 g) of salt in 1 cup (237 mL) of warm water.  Watch your dental pain. Let your dentist know if there are any changes.  Keep all follow-up visits. Contact a dentist if:  You have dental pain and you do not know why.  Medicine does not help your pain.  Your symptoms get worse.  You have new symptoms. Get help right away if:  You cannot open your mouth.  You are having trouble breathing or swallowing.  You have a fever.  Your face, neck, or jaw is swollen. These symptoms may be an emergency. Get help right away. Call your local emergency services (911 in the [place]  Do not wait to see if the symptoms will go away.  Do not drive yourself to the hospital. Summary  Dental pain may be caused by many things, including tooth decay, injury, or infection. In some cases, the cause is not known.  Dental pain may hurt a lot or very little. You may have pain all the time, or you may have it only when you eat or drink.  Take over-the-counter and prescription medicines only as told   by your dentist.  Watch your dental pain for any changes. Let your dentist know if symptoms get worse. This information is not intended to replace advice given to you by your health care provider. Make sure you discuss any questions you have with your health care provider. Document Revised: 05/20/2020 Document Reviewed: 05/20/2020 Elsevier Patient Education  2021 Elsevier Inc.  

## 2021-01-19 ENCOUNTER — Telehealth: Payer: Self-pay | Admitting: Nurse Practitioner

## 2021-01-19 MED ORDER — LIDOCAINE VISCOUS HCL 2 % MT SOLN: 15.0000 mL | OROMUCOSAL | 0 refills | Status: DC | PRN

## 2021-01-19 MED ORDER — CLINDAMYCIN HCL 300 MG PO CAPS: 300.0000 mg | ORAL_CAPSULE | Freq: Three times a day (TID) | ORAL | 0 refills | Status: DC

## 2021-01-19 NOTE — Telephone Encounter (Signed)
Called patient to leave her know of [name]'s recommendations and unable to leave message due to voicemail box was full for patient.

## 2021-01-19 NOTE — Telephone Encounter (Signed)
Please advise 

## 2021-01-19 NOTE — Telephone Encounter (Signed)
Pt called to see if provider was going to be calling her back today about the discussion she had with NT yesterday/ abut the antibiotic giving her a migraine please advise

## 2021-01-19 NOTE — Telephone Encounter (Signed)
Headache with first dose of augmentin. Will change to clindamycin. Magic mouthwash not covered by medicaid. Will switch to viscous lidocaine. Meds sent to the pharmacy.

## 2021-01-20 NOTE — Telephone Encounter (Signed)
Called and LVM asking for patient to please return my call.  

## 2021-01-20 NOTE — Telephone Encounter (Signed)
noted 

## 2021-01-20 NOTE — Telephone Encounter (Signed)
Pt called this morning/ I advised her of new antibiotic and mouth wash that should be covered being sent to pharmacy

## 2021-01-29 ENCOUNTER — Ambulatory Visit: Payer: Medicaid Other | Admitting: Nurse Practitioner

## 2021-02-08 ENCOUNTER — Ambulatory Visit: Payer: Medicaid Other | Admitting: Nurse Practitioner

## 2021-02-11 ENCOUNTER — Other Ambulatory Visit: Payer: Self-pay

## 2021-02-11 ENCOUNTER — Ambulatory Visit: Payer: Self-pay | Admitting: *Deleted

## 2021-02-11 ENCOUNTER — Ambulatory Visit (INDEPENDENT_AMBULATORY_CARE_PROVIDER_SITE_OTHER): Payer: Medicaid Other | Admitting: Nurse Practitioner

## 2021-02-11 ENCOUNTER — Encounter: Payer: Self-pay | Admitting: Nurse Practitioner

## 2021-02-11 VITALS — BP 126/85 | HR 128 | Temp 98.8°F | Ht 63.3 in | Wt 173.6 lb

## 2021-02-11 DIAGNOSIS — M5442 Lumbago with sciatica, left side: Secondary | ICD-10-CM | POA: Diagnosis not present

## 2021-02-11 DIAGNOSIS — G8929 Other chronic pain: Secondary | ICD-10-CM

## 2021-02-11 DIAGNOSIS — M25562 Pain in left knee: Secondary | ICD-10-CM | POA: Diagnosis not present

## 2021-02-11 DIAGNOSIS — H02843 Edema of right eye, unspecified eyelid: Secondary | ICD-10-CM | POA: Diagnosis not present

## 2021-02-11 DIAGNOSIS — M5441 Lumbago with sciatica, right side: Secondary | ICD-10-CM | POA: Diagnosis not present

## 2021-02-11 MED ORDER — BACITRACIN-POLYMYXIN B 500-10000 UNIT/GM OP OINT: 1.0000 "application " | TOPICAL_OINTMENT | Freq: Two times a day (BID) | OPHTHALMIC | 0 refills | Status: DC

## 2021-02-11 MED ORDER — TRIAMCINOLONE ACETONIDE 40 MG/ML IJ SUSP
40.0000 mg | Freq: Once | INTRAMUSCULAR | Status: AC
Start: 2021-02-11 — End: 2021-02-11
  Administered 2021-02-11: 40 mg via INTRAMUSCULAR

## 2021-02-11 NOTE — Assessment & Plan Note (Signed)
Chronic, stable. Controlled with cymbalta daily. Continue current regimen. No side effects with medication. Follow-up in 6 months.

## 2021-02-11 NOTE — Patient Instructions (Addendum)
It was great to see you!  Start antibiotic eye drops twice a day. You can do warm compresses 4 times a day. I am also going to order an ultrasound of the back of your knee. You will be hearing from someone to set this up.   Let's follow-up in 3 months, sooner if you have concerns.  Take care,  Rodman Pickle, NP

## 2021-02-11 NOTE — Progress Notes (Signed)
Acute Office Visit  Subjective:    Patient ID: Karina Anderson, female    DOB: Mar 29, 1971, 50 y.o.   MRN: [id number]  Chief Complaint  Patient presents with   Eye Swelling    Pt states she has had R eye swelling since last night.    Leg Pain    Pt states she has had L leg pain for the last 3 weeks. No known injuries per patient.     HPI Patient is in today for left knee pain and right eye swelling.   KNEE PAIN  Duration: 3 months Involved knee: left Mechanism of injury: unknown Location:posterior Onset: gradual Severity: severe  Quality:  sharp Frequency: intermittent Radiation: no Aggravating factors: walking and prolonged sitting  Alleviating factors: ice and rest  Status: fluctuating Treatments attempted: rest  Relief with NSAIDs?:  No NSAIDs Taken Weakness with weight bearing or walking: no Sensation of giving way: no Locking: yes Popping: yes Bruising: no Swelling:  at times, worse at night Redness: no Paresthesias/decreased sensation: no Fevers: no  EYELID SWELLING  Duration: 1 days Involved eye:  right Onset: sudden Severity: moderate swelling, no pain Quality: no pain, mild itching Foreign body sensation:no Visual impairment: no Eye redness: no Discharge: no Crusting or matting of eyelids: no Swelling: yes Photophobia: no Itching: yes Tearing: no Headache: no Floaters: no URI symptoms: no Contact lens use: no Close contacts with similar problems: yes Eye trauma: no Aggravating factors: unknown Alleviating factors: unknown Status: worse Treatments attempted: none  BACK PAIN  Has improved significantly since starting cymbalta. She denies back pain today and no radiation down her legs. She has full range of motion. Denies side effects from medication.   Past Medical History:  Diagnosis Date   Chronic lower back pain    Hydradenitis    Seizures ([place])     Past Surgical History:  Procedure Laterality Date   DILATION AND CURETTAGE OF  UTERUS     3 miscarriages   HYDRADENITIS EXCISION     R underarm   TUBAL LIGATION     TUMOR REMOVAL  07/2020   index finger on L hand    Family History  Problem Relation Age of Onset   Seizures Mother    Diabetes Mother    Heart attack Father    Hypertension Brother    Eczema Daughter    Anxiety disorder Maternal Grandmother    Hypertension Brother    Hypertension Brother    Hypertension Brother    Hypertension Brother     Social History   Socioeconomic History   Marital status: Single    Spouse name: Not on file   Number of children: Not on file   Years of education: Not on file   Highest education level: Not on file  Occupational History   Not on file  Tobacco Use   Smoking status: Every Day    Packs/day: 0.50    Pack years: 0.00    Types: Cigarettes   Smokeless tobacco: Never  Vaping Use   Vaping Use: Never used  Substance and Sexual Activity   Alcohol use: No   Drug use: No   Sexual activity: Not Currently  Other Topics Concern   Not on file  Social History Narrative   Not on file   Social Determinants of Health   Financial Resource Strain: Not on file  Food Insecurity: Not on file  Transportation Needs: Not on file  Physical Activity: Not on file  Stress: Not on file  Social Connections: Not on file  Intimate Partner Violence: Not on file    Outpatient Medications Prior to Visit  Medication Sig Dispense Refill   DULoxetine (CYMBALTA) 30 MG capsule Take 1 capsule (30 mg total) by mouth daily. 30 capsule 3   phenytoin (DILANTIN) 100 MG ER capsule Take 1 capsule (100 mg total) by mouth 3 (three) times daily. 90 capsule 2   famotidine (PEPCID) 20 MG tablet Take 1 tablet (20 mg total) by mouth 2 (two) times daily. (Patient not taking: Reported on 02/11/2021) 60 tablet 0   HYDROcodone-acetaminophen (NORCO/VICODIN) 5-325 MG tablet Take 1 tablet by mouth every 6 (six) hours as needed. (Patient not taking: No sig reported) 8 tablet 0   metoCLOPramide  (REGLAN) 10 MG tablet Take 1 tablet (10 mg total) by mouth every 6 (six) hours as needed. (Patient not taking: Reported on 02/11/2021) 30 tablet 0   nicotine (NICODERM CQ) 14 mg/24hr patch Place 1 patch (14 mg total) onto the skin daily. (Patient not taking: Reported on 02/11/2021) 28 patch 0   sucralfate (CARAFATE) 1 g tablet Take 1 tablet (1 g total) by mouth 4 (four) times daily. (Patient not taking: Reported on 02/11/2021) 120 tablet 1   clindamycin (CLEOCIN) 300 MG capsule Take 1 capsule (300 mg total) by mouth 3 (three) times daily. 21 capsule 0   lidocaine (XYLOCAINE) 2 % solution Use as directed 15 mLs in the mouth or throat as needed for mouth pain. 100 mL 0   magic mouthwash (lidocaine, diphenhydrAMINE, alum & mag hydroxide) suspension Swish and spit 5 mLs 4 (four) times daily as needed for mouth pain. 360 mL 0   No facility-administered medications prior to visit.    Allergies  Allergen Reactions   Citrus Hives   Sulfamethoxazole-Trimethoprim Hives   Butorphanol Hives   Butorphanol Tartrate Hives   Contrast Media [Iodinated Diagnostic Agents] Nausea And Vomiting   Food Other (See Comments)    Allergic to Citrus Fruits.   Ibuprofen    Ketorolac    Lorazepam Hives   Naproxen    Prochlorperazine Hives   Nsaids Rash    Review of Systems  Constitutional: Negative.   HENT: Negative.    Eyes:  Positive for itching. Negative for discharge.       Right eyelid swelling for 1 day  Respiratory: Negative.    Cardiovascular: Negative.   Gastrointestinal: Negative.   Genitourinary: Negative.   Musculoskeletal:  Positive for arthralgias (left knee pain/swelling).  Skin: Negative.   Neurological: Negative.   Psychiatric/Behavioral: Negative.        Objective:    Physical Exam Vitals and nursing note reviewed.  Constitutional:      General: She is not in acute distress.    Appearance: Normal appearance.  HENT:     Head: Normocephalic.  Eyes:     General:        Right eye:  Discharge present.        Left eye: No discharge.     Comments: Right eyelid swelling, with crusty drainage around eye.  Cardiovascular:     Rate and Rhythm: Normal rate and regular rhythm.     Pulses: Normal pulses.     Heart sounds: Normal heart sounds.  Pulmonary:     Effort: Pulmonary effort is normal.     Breath sounds: Normal breath sounds.  Musculoskeletal:        General: Swelling (mild edema noted behind left knee) present. No tenderness. Normal range of motion.  Cervical back: Normal range of motion.  Skin:    General: Skin is warm.  Neurological:     General: No focal deficit present.     Mental Status: She is alert and oriented to person, place, and time.  Psychiatric:        Mood and Affect: Mood normal.        Behavior: Behavior normal.        Thought Content: Thought content normal.        Judgment: Judgment normal.    BP 126/85   Pulse (!) 128   Temp 98.8 F (37.1 C) (Oral)   Ht 5' 3.3" (1.608 m)   Wt 173 lb 9.6 oz (78.7 kg)   SpO2 97%   BMI 30.46 kg/m  Wt Readings from Last 3 Encounters:  02/11/21 173 lb 9.6 oz (78.7 kg)  01/18/21 174 lb 3.2 oz (79 kg)  12/31/20 177 lb 3.2 oz (80.4 kg)    Health Maintenance Due  Topic Date Due   COVID-19 Vaccine (1) Never done   Pneumococcal Vaccine 40-19 Years old (1 - PCV) Never done    There are no preventive care reminders to display for this patient.   Lab Results  Component Value Date   TSH 1.790 12/31/2020   Lab Results  Component Value Date   WBC 9.4 12/09/2020   HGB 12.4 12/09/2020   HCT 37.0 12/09/2020   MCV 82.4 12/09/2020   PLT 326 12/09/2020   Lab Results  Component Value Date   NA 134 (L) 12/09/2020   K 3.9 12/09/2020   CO2 22 12/09/2020   GLUCOSE 166 (H) 12/09/2020   BUN 16 12/09/2020   CREATININE 0.65 12/09/2020   BILITOT 0.5 12/09/2020   ALKPHOS 68 12/09/2020   AST 18 12/09/2020   ALT 15 12/09/2020   PROT 7.8 12/09/2020   ALBUMIN 3.8 12/09/2020   CALCIUM 9.2 12/09/2020    ANIONGAP 9 12/09/2020   EGFR 94 12/01/2020   Lab Results  Component Value Date   CHOL 249 (H) 12/31/2020   Lab Results  Component Value Date   HDL 49 12/31/2020   Lab Results  Component Value Date   LDLCALC 158 (H) 12/31/2020   Lab Results  Component Value Date   TRIG 228 (H) 12/31/2020   Lab Results  Component Value Date   CHOLHDL 4.7 03/12/2016   Lab Results  Component Value Date   HGBA1C 5.4 12/31/2020       Assessment & Plan:   Problem List Items Addressed This Visit       Nervous and Auditory   Chronic midline low back pain with bilateral sciatica    Chronic, stable. Controlled with cymbalta daily. Continue current regimen. No side effects with medication. Follow-up in 6 months.        Other Visit Diagnoses     Swelling of right eyelid    -  Primary   Will treat with kenalog IM and polysporin ointment twice a day. Can do warm compresses 4x a day. F/U if symptoms worsen or do not improve.   Relevant Medications   triamcinolone acetonide (KENALOG-40) injection 40 mg (Completed)   Chronic pain of left knee       Kenalog IM. Will check ultrasound to rule out bakers cyst. F/U in 3 months   Relevant Medications   triamcinolone acetonide (KENALOG-40) injection 40 mg (Completed)   Other Relevant Orders   [place] LT LOWER EXTREM LTD SOFT TISSUE NON VASCULAR  Meds ordered this encounter  Medications   bacitracin-polymyxin b (POLYSPORIN) ophthalmic ointment    Sig: Place 1 application into the right eye every 12 (twelve) hours. apply to eye every 12 hours while awake    Dispense:  3.5 g    Refill:  0   triamcinolone acetonide (KENALOG-40) injection 40 mg      Charyl Dancer, NP

## 2021-02-11 NOTE — Telephone Encounter (Signed)
Right upper eyelid with swelling that began this morning after noticing a insect-like bump on top of the eyelid last night. "Area shaped like a star" No fever/drainage, white area is white.  Appointment this afternoon with pcp at 2:20p.  Routing to office for notification of afternoon appointment.    Reason for Disposition  [1] MILD eyelid swelling (puffiness) AND [2] persists > 3 days  (Exception: suspect mosquito bites)  Answer Assessment - Initial Assessment Questions 1. ONSET: "When did the swelling start?" (e.g., minutes, hours, days)     This morning 2. LOCATION: "What part of the eyelids is swollen?"     Right upper eye lid 3. SEVERITY: "How swollen is it?"     Is not swollen shut but close 4. ITCHING: "Is there any itching?" If Yes, ask: "How much?"   (Scale 1-10; mild, moderate or severe)     Mild itchy 5. PAIN: "Is the swelling painful to touch?" If Yes, ask: "How painful is it?"   (Scale 1-10; mild, moderate or severe)     no 6. FEVER: "Do you have a fever?" If Yes, ask: "What is it, how was it measured, and when did it start?"      no 7. CAUSE: "What do you think is causing the swelling?"     Had a bite-like bump on that eyelid last night 8. RECURRENT SYMPTOM: "Have you had eyelid swelling before?" If Yes, ask: "When was the last time?" "What happened that time?"     no 9. OTHER SYMPTOMS: "Do you have any other symptoms?" (e.g., blurred vision, eye discharge, rash, runny nose)     no 10. PREGNANCY: "Is there any chance you are pregnant?" "When was your last menstrual period?"       na  Protocols used: Eye - Swelling-A-AH

## 2021-02-11 NOTE — Telephone Encounter (Signed)
noted 

## 2021-02-11 NOTE — Telephone Encounter (Signed)
FYI

## 2021-02-12 ENCOUNTER — Telehealth: Payer: Self-pay

## 2021-02-12 ENCOUNTER — Other Ambulatory Visit: Payer: Self-pay | Admitting: Nurse Practitioner

## 2021-02-12 MED ORDER — GABAPENTIN 100 MG PO CAPS: 100.0000 mg | ORAL_CAPSULE | Freq: Two times a day (BID) | ORAL | 3 refills | Status: DC

## 2021-02-12 NOTE — Telephone Encounter (Signed)
Copied from CRM [id number]. Topic: General - Other >> Feb 12, 2021 12:23 PM Pawlus, Maxine Glenn A wrote: Reason for CRM: Pt was seen yesterday (6/16) and called in stating her swelling is not getting better. Pt requested a call back, please advise.

## 2021-02-12 NOTE — Telephone Encounter (Signed)
Patient states her eye is doing better, she is concerned about her knee. States the back of her leg is swelling and painful. Patient is not taking any OTC pain meds because she said they do not work

## 2021-02-12 NOTE — Telephone Encounter (Signed)
Pt called reporting that she has not experienced any improvement, she is requesting pain medication.  [facility][id number] [place], [place] - [address] AT [facility] OF [address]  [address] [place] [address]  Phone: [phone] Fax: [phone]

## 2021-02-12 NOTE — Progress Notes (Signed)
Having increased pain in knee, Kenalog IM did not help. Encouraged ice, elevation, can wear a knee brace. Will treat with gabapentin. If still having pain, can follow-up with [facility] since we are unable to schedule ultrasound until July 19.

## 2021-02-12 NOTE — Telephone Encounter (Signed)
Called patient, no answer left VM with Lauren's advise regarding gabapentin sent to pharmacy. Also advised patient if not feeling better to follow up with [facility] walk-in.

## 2021-02-22 ENCOUNTER — Ambulatory Visit: Payer: Medicaid Other | Admitting: Pain Medicine

## 2021-02-28 ENCOUNTER — Other Ambulatory Visit: Payer: Self-pay | Admitting: Nurse Practitioner

## 2021-02-28 NOTE — Telephone Encounter (Signed)
Requested Prescriptions  Pending Prescriptions Disp Refills  . DULoxetine (CYMBALTA) 30 MG capsule [Pharmacy Med Name: DULOXETINE DR 30MG  CAPSULES] 90 capsule 0    Sig: TAKE 1 CAPSULE(30 MG) BY MOUTH DAILY     Psychiatry: Antidepressants - SNRI Passed - 02/28/2021  3:18 AM      Passed - Last BP in normal range    BP Readings from Last 1 Encounters:  02/11/21 126/85         Passed - Valid encounter within last 6 months    Recent Outpatient Visits          2 weeks ago Swelling of right eyelid   [facility] McElwee, Lauren A, NP   1 month ago Pain in gums   [facility] McElwee, Lauren A, NP   1 month ago Routine general medical examination at a health care facility   [facility], Lauren A, NP   2 months ago Chronic midline low back pain with bilateral sciatica   [facility] McElwee, [facility], NP      Future Appointments            In 2 months McElwee, Jake Church, NP [facility]

## 2021-03-03 ENCOUNTER — Telehealth: Payer: Self-pay

## 2021-03-03 NOTE — Telephone Encounter (Signed)
Please let Karina Anderson know that she can go to [facility] for a walk-in appointment or I can put in a referral to ortho which may take a little longer to process. We are waiting for her ultrasound which isn't scheduled until 7/19.

## 2021-03-03 NOTE — Telephone Encounter (Signed)
Copied from CRM [id number]. Topic: General - Other >> Mar 03, 2021  1:36 PM Jaquita Rector A wrote: Reason for CRM: Patient called in asking Rodman Pickle nurse to give her a call because she stated that she have been complaining about the pain in the back of her knee for a few weeks now. Per patient she does not want Gabapentin because all it does is make her sleep. Asking for some help ASAP please Ph# [phone]

## 2021-03-03 NOTE — Telephone Encounter (Signed)
Patient notified

## 2021-03-04 DIAGNOSIS — M7122 Synovial cyst of popliteal space [Baker], left knee: Secondary | ICD-10-CM | POA: Diagnosis not present

## 2021-03-04 DIAGNOSIS — M25462 Effusion, left knee: Secondary | ICD-10-CM | POA: Diagnosis not present

## 2021-03-04 DIAGNOSIS — M25562 Pain in left knee: Secondary | ICD-10-CM | POA: Diagnosis not present

## 2021-03-10 ENCOUNTER — Ambulatory Visit: Payer: Self-pay

## 2021-03-10 NOTE — Telephone Encounter (Signed)
Spoke with patient and notified her of Lauren's recommendations and what she can do to help relieve the pain. Patient states she had tried everything that Leotis Shames has recommended. Patient became angry and states she is still having severe pain. Patient was advised to go to to the local ER to help with relieving pain. Patient verbalized understanding and has no further questions.

## 2021-03-10 NOTE — Telephone Encounter (Signed)
Please let Karina Anderson know that she can try a compression sleeve or ACE wrap to help with the pain in her knee. Make sure she is using ice several times a day and to keep her leg elevated when sitting. She can take extra strength tylenol 1,000mg  three times a day. She has her ultrasound next week, so hopefully we can figure out if she has a collection of fluid behind her knee or what is causing her pain.

## 2021-03-10 NOTE — Telephone Encounter (Signed)
      Message from Traci Sermon sent at 03/10/2021  9:56 AM EDT  Pt called in stating she is having some bad knee problems, and that she is in a lot of pain, pt states she has been giving some medication, but it is only making her sleepy, and she's wanting someone to reach back out to her to see about getting something different. Please advise.       Call History   Type Contact Phone/Fax User  03/10/2021 09:54 AM EDT Phone (Incoming) [address], [place] (Self) [phone] (H) Tawanna Cooler, Faith N   Pt. Reports she went to [facility] last week. They did "an xray and said it's my tissue and they will see what the ultrasound shows." Requesting a different medication for pain be sent to pharmacy. Gabapentin does not help, "only makes me sleepy." Please advise.

## 2021-03-11 ENCOUNTER — Other Ambulatory Visit: Payer: Self-pay

## 2021-03-11 ENCOUNTER — Emergency Department: Payer: Medicaid Other

## 2021-03-11 ENCOUNTER — Emergency Department
Admission: EM | Admit: 2021-03-11 | Discharge: 2021-03-11 | Disposition: A | Discharge status: Home / Self Care | Payer: Medicaid Other | Attending: Emergency Medicine | Admitting: Emergency Medicine

## 2021-03-11 DIAGNOSIS — M79662 Pain in left lower leg: Secondary | ICD-10-CM | POA: Diagnosis not present

## 2021-03-11 DIAGNOSIS — M79605 Pain in left leg: Secondary | ICD-10-CM | POA: Diagnosis not present

## 2021-03-11 DIAGNOSIS — F17210 Nicotine dependence, cigarettes, uncomplicated: Secondary | ICD-10-CM | POA: Insufficient documentation

## 2021-03-11 DIAGNOSIS — M7989 Other specified soft tissue disorders: Secondary | ICD-10-CM | POA: Diagnosis not present

## 2021-03-11 MED ORDER — LIDOCAINE 5 % EX PTCH: 1.0000 | MEDICATED_PATCH | Freq: Two times a day (BID) | CUTANEOUS | 0 refills | Status: AC

## 2021-03-11 MED ORDER — OXYCODONE HCL 5 MG PO TABS: 5.0000 mg | ORAL_TABLET | Freq: Three times a day (TID) | ORAL | 0 refills | Status: AC | PRN

## 2021-03-11 MED ORDER — LIDOCAINE 5 % EX PTCH: 1.0000 | MEDICATED_PATCH | CUTANEOUS | Status: DC

## 2021-03-11 NOTE — Telephone Encounter (Signed)
Called pt to advise of Lauren's message she states that she is currently waiting on a ride to take her to the hospital

## 2021-03-11 NOTE — Discharge Instructions (Addendum)
Stay off your leg. Follow up with ortho for further workup.  Take oxycodone as prescribed. Do not drink alcohol, drive or participate in any other potentially dangerous activities while taking this medication as it may make you sleepy. Do not take this medication with any other sedating medications, either prescription or over-the-counter. If you were prescribed Percocet or Vicodin, do not take these with acetaminophen (Tylenol) as it is already contained within these medications.  This medication is an opiate (or narcotic) pain medication and can be habit forming. Use it as little as possible to achieve adequate pain control. Do not use or use it with extreme caution if you have a history of opiate abuse or dependence. If you are on a pain contract with your primary care doctor or a pain specialist, be sure to let them know you were prescribed this medication today from the Emergency Department. This medication is intended for your use only - do not give any to anyone else and keep it in a secure place where nobody else, especially children, have access to it.

## 2021-03-11 NOTE — Telephone Encounter (Signed)
Agree with Karina Anderson's recommendations. If she is having severe pain, she needs to go to ER for evaluation sooner than scheduled ultrasound.

## 2021-03-11 NOTE — ED Provider Notes (Signed)
[facility] Emergency Department Provider Note  ____________________________________________   Event Date/Time   First MD Initiated Contact with Patient 03/11/21 1302     (approximate)  I have reviewed the triage vital signs and the nursing notes.   HISTORY  Chief Complaint Leg Pain    HPI Karina Anderson is a 50 y.o. female who has had pain in left leg for three weeks.  No history of blood clot. No falls. Can still walk. Pt takes tylenol for pain. Xray done and orthopedic-5 days ago saw arthritis on the xray.  Cant take Nsaids.           Past Medical History:  Diagnosis Date   Chronic lower back pain    Hydradenitis    Seizures (HCC)     Patient Active Problem List   Diagnosis Date Noted   Impaired fasting glucose 12/31/2020   History of psychosis 12/01/2020   History of suicide attempt 12/01/2020   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   Tobacco use disorder 03/13/2016   Cannabis use disorder, moderate, dependence (HCC) 03/13/2016   Bipolar affective disorder, manic, severe, with psychotic behavior (HCC) 03/12/2016   Seizures (HCC) 03/11/2016   Involuntary commitment 03/11/2016    Past Surgical History:  Procedure Laterality Date   DILATION AND CURETTAGE OF UTERUS     3 miscarriages   HYDRADENITIS EXCISION     R underarm   TUBAL LIGATION     TUMOR REMOVAL  07/2020   index finger on L hand    Prior to Admission medications   Medication Sig Start Date End Date Taking? Authorizing Provider  bacitracin-polymyxin b (POLYSPORIN) ophthalmic ointment Place 1 application into the right eye every 12 (twelve) hours. apply to eye every 12 hours while awake 02/11/21   McElwee, Lauren A, NP  DULoxetine (CYMBALTA) 30 MG capsule TAKE 1 CAPSULE(30 MG) BY MOUTH DAILY 02/28/21   McElwee, Lauren A, NP  famotidine (PEPCID) 20 MG tablet Take 1 tablet (20 mg total) by mouth 2 (two) times daily. Patient not taking: Reported on  02/11/2021 12/09/20   Sharman Cheek, MD  gabapentin (NEURONTIN) 100 MG capsule Take 1 capsule (100 mg total) by mouth 2 (two) times daily. Start one daily at night for 3 days then can take twice a day 02/12/21   McElwee, Lauren A, NP  HYDROcodone-acetaminophen (NORCO/VICODIN) 5-325 MG tablet Take 1 tablet by mouth every 6 (six) hours as needed. Patient not taking: No sig reported 09/28/20   Tommi Rumps, PA-C  metoCLOPramide (REGLAN) 10 MG tablet Take 1 tablet (10 mg total) by mouth every 6 (six) hours as needed. Patient not taking: Reported on 02/11/2021 12/09/20   Sharman Cheek, MD  nicotine (NICODERM CQ) 14 mg/24hr patch Place 1 patch (14 mg total) onto the skin daily. Patient not taking: Reported on 02/11/2021 12/31/20   Gerre Scull, NP  phenytoin (DILANTIN) 100 MG ER capsule Take 1 capsule (100 mg total) by mouth 3 (three) times daily. 12/01/20   McElwee, Lauren A, NP  sucralfate (CARAFATE) 1 g tablet Take 1 tablet (1 g total) by mouth 4 (four) times daily. Patient not taking: Reported on 02/11/2021 12/09/20   Sharman Cheek, MD  omeprazole (PRILOSEC OTC) 20 MG tablet Take 1 tablet (20 mg total) by mouth daily. 11/29/19 08/16/20  Loleta Rose, MD    Allergies Citrus, Sulfamethoxazole-trimethoprim, Butorphanol, Butorphanol tartrate, Contrast media [iodinated diagnostic agents], Food, Ibuprofen, Ketorolac, Lorazepam, Naproxen, Prochlorperazine, and Nsaids  Family History  Problem Relation Age of Onset   Seizures Mother    Diabetes Mother    Heart attack Father    Hypertension Brother    Eczema Daughter    Anxiety disorder Maternal Grandmother    Hypertension Brother    Hypertension Brother    Hypertension Brother    Hypertension Brother     Social History Social History   Tobacco Use   Smoking status: Every Day    Packs/day: 0.50    Types: Cigarettes   Smokeless tobacco: Never  Vaping Use   Vaping Use: Never used  Substance Use Topics   Alcohol use: No   Drug  use: No      Review of Systems Constitutional: No fever/chills Eyes: No visual changes. ENT: No sore throat. Cardiovascular: Denies chest pain. Respiratory: Denies shortness of breath. Gastrointestinal: No abdominal pain.  No nausea, no vomiting.  No diarrhea.  No constipation. Genitourinary: Negative for dysuria. Musculoskeletal: Negative for back pain. Left leg pain  Skin: Negative for rash. Neurological: Negative for headaches, focal weakness or numbness. All other ROS negative ____________________________________________   PHYSICAL EXAM:  VITAL SIGNS: ED Triage Vitals  Enc Vitals Group     BP 03/11/21 1111 (!) 149/106     Pulse Rate 03/11/21 1111 98     Resp 03/11/21 1111 16     Temp 03/11/21 1111 98.8 F (37.1 C)     Temp Source 03/11/21 1111 Oral     SpO2 03/11/21 1111 99 %     Weight 03/11/21 1107 173 lb (78.5 kg)     Height 03/11/21 1107 5\' 5"  (1.651 m)     Head Circumference --      Peak Flow --      Pain Score 03/11/21 1106 10     Pain Loc --      Pain Edu? --      Excl. in GC? --     Constitutional: Alert and oriented. Well appearing and in no acute distress. Eyes: Conjunctivae are normal. EOMI. Head: Atraumatic. Nose: No congestion/rhinnorhea. Mouth/Throat: Mucous membranes are moist.   Neck: No stridor. Trachea Midline. FROM Cardiovascular: Normal rate, regular rhythm. Grossly normal heart sounds.  Good peripheral circulation. Respiratory: Normal respiratory effort.  No retractions. Lungs CTAB. Gastrointestinal: Soft and nontender. No distention. No abdominal bruits.  Musculoskeletal: pain behind the L knee, no redness, warmth, Full ROM of knee. Good distal pulse.  Able to ambulate  Neurologic:  Normal speech and language. No gross focal neurologic deficits are appreciated.  Skin:  Skin is warm, dry and intact. No rash noted. Psychiatric: Mood and affect are normal. Speech and behavior are normal. GU: Deferred    ____________________________________________   RADIOLOGY   Official radiology report(s): 03/13/21 Venous Img Lower Unilateral Left  Result Date: 03/11/2021 CLINICAL DATA:  Left lower extremity pain and swelling EXAM: LEFT LOWER EXTREMITY VENOUS DOPPLER ULTRASOUND TECHNIQUE: Gray-scale sonography with compression, as well as color and duplex ultrasound, were performed to evaluate the deep venous system(s) from the level of the common femoral vein through the popliteal and proximal calf veins. COMPARISON:  None. FINDINGS: VENOUS Normal compressibility of the common femoral, superficial femoral, and popliteal veins, as well as the visualized calf veins. Visualized portions of profunda femoral vein and great saphenous vein unremarkable. No filling defects to suggest DVT on grayscale or color Doppler imaging. Doppler waveforms show normal direction of venous flow, normal respiratory plasticity and response to augmentation. Limited views of the contralateral common femoral vein are unremarkable.  OTHER Targeted ultrasound was performed of the soft tissues of the posterior left knee at site of patient's pain. No edema, fluid collection, or other sonographic abnormality identified at this location. Limitations: none IMPRESSION: Negative for left lower extremity DVT. Electronically Signed   By: Duanne Guess D.O.   On: 03/11/2021 12:27    ____________________________________________   PROCEDURES  Procedure(s) performed (including Critical Care):  Procedures   ____________________________________________   INITIAL IMPRESSION / ASSESSMENT AND PLAN / ED COURSE  Karina Anderson was evaluated in Emergency Department on 03/11/2021 for the symptoms described in the history of present illness. She was evaluated in the context of the global COVID-19 pandemic, which necessitated consideration that the patient might be at risk for infection with the SARS-CoV-2 virus that causes COVID-19. Institutional protocols  and algorithms that pertain to the evaluation of patients at risk for COVID-19 are in a state of rapid change based on information released by regulatory bodies including the CDC and federal and state organizations. These policies and algorithms were followed during the patient's care in the ED.    Consider baker cyst vs DVT.  No trauma xray recently no fracture. Warm well perfused.  Unable to control pain with tylenol. Cant do NSAID.  Discussed short course of oxycodone and risk of addiction pt understand risks and will f/u with ortho for further workup.   Database reviewed and no recent fills.        ____________________________________________   FINAL CLINICAL IMPRESSION(S) / ED DIAGNOSES   Final diagnoses:  Left leg pain      MEDICATIONS GIVEN DURING THIS VISIT:  Medications - No data to display   ED Discharge Orders     None        Note:  This document was prepared using Dragon voice recognition software and may include unintentional dictation errors.    Concha Se, MD 03/11/21 1330

## 2021-03-11 NOTE — ED Triage Notes (Signed)
Pt c/o swelling and pain to the posterior left leg for the past 3 weeks. States she had xray and they treating her for cyst, has not had an ultrasound to r/o DVT

## 2021-03-16 ENCOUNTER — Ambulatory Visit: Payer: Medicaid Other

## 2021-03-19 ENCOUNTER — Ambulatory Visit: Payer: Self-pay | Admitting: *Deleted

## 2021-03-19 NOTE — Telephone Encounter (Signed)
FYI pt directed to go to ED

## 2021-03-19 NOTE — Telephone Encounter (Signed)
Agree with ER.

## 2021-03-19 NOTE — Telephone Encounter (Signed)
Pt reports diarrhea, nausea and vomiting x 5 days, 7-8 times in each 24 hr period. States "Can't keep anything down." Reports headache, dizziness, dry mouth,decreased urine."Can't drink anything." States daughter had similar symptoms earlier. Pt reluctant for further triage. Advised ED, unsure if pt will follow disposition.    Reason for Disposition  [1] Drinking very little AND [2] dehydration suspected (e.g., no urine > 12 hours, very dry mouth, very lightheaded)  Answer Assessment - Initial Assessment Questions 1. VOMITING SEVERITY: "How many times have you vomited in the past 24 hours?"     - MILD:  1 - 2 times/day    - MODERATE: 3 - 5 times/day, decreased oral intake without significant weight loss or symptoms of dehydration    - SEVERE: 6 or more times/day, vomits everything or nearly everything, with significant weight loss, symptoms of dehydration      7 2. ONSET: "When did the vomiting begin?"      5 days go 3. FLUIDS: "What fluids or food have you vomited up today?" "Have you been able to keep any fluids down?"     no 4. ABDOMINAL PAIN: "Are your having any abdominal pain?" If yes : "How bad is it and what does it feel like?" (e.g., crampy, dull, intermittent, constant)      no 5. DIARRHEA: "Is there any diarrhea?" If Yes, ask: "How many times today?"     5 so far today 6. CONTACTS: "Is there anyone else in the family with the same symptoms?"      Daughter had it before 7. CAUSE: "What do you think is causing your vomiting?"     unsure 8. HYDRATION STATUS: "Any signs of dehydration?" (e.g., dry mouth [not only dry lips], too weak to stand) "When did you last urinate?"     All 9. OTHER SYMPTOMS: "Do you have any other symptoms?" (e.g., fever, headache, vertigo, vomiting blood or coffee grounds, recent head injury)   Headache, dizzy  Protocols used: Vomiting-A-AH

## 2021-03-23 ENCOUNTER — Encounter: Payer: Self-pay | Admitting: Internal Medicine

## 2021-03-23 ENCOUNTER — Ambulatory Visit (INDEPENDENT_AMBULATORY_CARE_PROVIDER_SITE_OTHER): Payer: Medicaid Other | Admitting: Internal Medicine

## 2021-03-23 DIAGNOSIS — R112 Nausea with vomiting, unspecified: Secondary | ICD-10-CM | POA: Diagnosis not present

## 2021-03-23 DIAGNOSIS — R197 Diarrhea, unspecified: Secondary | ICD-10-CM | POA: Diagnosis not present

## 2021-03-23 MED ORDER — LACTOBACILLUS PO TABS: 1.0000 | ORAL_TABLET | ORAL | 0 refills | Status: DC | PRN

## 2021-03-23 MED ORDER — ONDANSETRON HCL 8 MG PO TABS: 8.0000 mg | ORAL_TABLET | Freq: Three times a day (TID) | ORAL | 0 refills | Status: DC | PRN

## 2021-03-23 NOTE — Progress Notes (Signed)
LMP 03/09/2021 (Exact Date)    Subjective:    Patient ID: Karina Anderson, female    DOB: 1970/10/08, 50 y.o.   MRN: [id number]  Chief Complaint  Patient presents with  . Covid Exposure    Started last week, had 2 negative  covid tests.   . Diarrhea  . Emesis  . Generalized Body Aches  . loss of taste    HPI: Karina Anderson is a 50 y.o. female   This visit was completed via telephone due to the restrictions of the COVID-19 pandemic. All issues as above were discussed and addressed but no physical exam was performed. If it was felt that the patient should be evaluated in the office, they were directed there. The patient verbally consented to this visit. Patient was unable to complete an audio/visual visit due to Technical difficulties. Due to the catastrophic nature of the COVID-19 pandemic, this visit was done through audio contact only. Location of the patient: home Location of the provider: work Those involved with this call:  Provider: Loura Pardon, MD CMA: Tristan Schroeder, CMA Front Desk/Registration: Harriet Pho  Time spent on call: 10 minutes on the phone discussing health concerns. 10 minutes total spent in review of patient's record and preparation of their chart.  No headaches ,  -ve home tests x 2 this week.    Diarrhea  This is a new (both tests were -ve cannot taste anything.) problem. The current episode started 1 to 4 weeks ago (denies bloody). The problem occurs 5 to 10 times per day (x 2 today, 4-6 times a day denies fever - 2 days ago was normal.). The stool consistency is described as Mucous and watery. Associated symptoms include a URI and vomiting.  Emesis  This is a new (not today.) problem. The current episode started in the past 7 days. Associated symptoms include diarrhea and URI.  URI  This is a new (coughing  white phlegm not green phlegm.) problem. The current episode started in the past 7 days. Associated symptoms include diarrhea and vomiting.    Chief Complaint  Patient presents with  . Covid Exposure    Started last week, had 2 negative  covid tests.   . Diarrhea  . Emesis  . Generalized Body Aches  . loss of taste    Relevant past medical, surgical, family and social history reviewed and updated as indicated. Interim medical history since our last visit reviewed. Allergies and medications reviewed and updated.  Review of Systems  Gastrointestinal:  Positive for diarrhea and vomiting.   Per HPI unless specifically indicated above     Objective:    LMP 03/09/2021 (Exact Date)   Wt Readings from Last 3 Encounters:  03/11/21 173 lb (78.5 kg)  02/11/21 173 lb 9.6 oz (78.7 kg)  01/18/21 174 lb 3.2 oz (79 kg)    Physical Exam  Unable to peform sec to virtual visit.   Results for orders placed or performed in visit on 12/31/20  WET PREP FOR TRICH, YEAST, CLUE   Specimen: Sterile Swab   Sterile Swab  Result Value Ref Range   Trichomonas Exam Negative Negative   Yeast Exam Negative Negative   Clue Cell Exam Negative Negative  Lipid Panel w/o Chol/HDL Ratio  Result Value Ref Range   Cholesterol, Total 249 (H) 100 - 199 mg/dL   Triglycerides 643 (H) 0 - 149 mg/dL   HDL 49 >32 mg/dL   VLDL Cholesterol Cal 42 (H) 5 - 40 mg/dL  LDL Chol Calc (NIH) 158 (H) 0 - 99 mg/dL  TSH  Result Value Ref Range   TSH 1.790 0.450 - 4.500 uIU/mL  Dilantin (Phenytoin) level, total  Result Value Ref Range   Phenytoin (Dilantin), Serum 9.0 (L) 10.0 - 20.0 ug/mL  Bayer DCA Hb A1c Waived  Result Value Ref Range   HB A1C (BAYER DCA - WAIVED) 5.4 <7.0 %  Cytology - PAP  Result Value Ref Range   Adequacy      Satisfactory for evaluation; transformation zone component ABSENT.   Diagnosis      - Negative for intraepithelial lesion or malignancy (NILM)   Microorganisms Trichomonas vaginalis present         Current Outpatient Medications:  .  bacitracin-polymyxin b (POLYSPORIN) ophthalmic ointment, Place 1 application into the  right eye every 12 (twelve) hours. apply to eye every 12 hours while awake, Disp: 3.5 g, Rfl: 0 .  DULoxetine (CYMBALTA) 30 MG capsule, TAKE 1 CAPSULE(30 MG) BY MOUTH DAILY, Disp: 90 capsule, Rfl: 0 .  famotidine (PEPCID) 20 MG tablet, Take 1 tablet (20 mg total) by mouth 2 (two) times daily. (Patient not taking: Reported on 02/11/2021), Disp: 60 tablet, Rfl: 0 .  gabapentin (NEURONTIN) 100 MG capsule, Take 1 capsule (100 mg total) by mouth 2 (two) times daily. Start one daily at night for 3 days then can take twice a day (Patient not taking: Reported on 03/23/2021), Disp: 90 capsule, Rfl: 3 .  HYDROcodone-acetaminophen (NORCO/VICODIN) 5-325 MG tablet, Take 1 tablet by mouth every 6 (six) hours as needed. (Patient not taking: No sig reported), Disp: 8 tablet, Rfl: 0 .  metoCLOPramide (REGLAN) 10 MG tablet, Take 1 tablet (10 mg total) by mouth every 6 (six) hours as needed. (Patient not taking: Reported on 02/11/2021), Disp: 30 tablet, Rfl: 0 .  nicotine (NICODERM CQ) 14 mg/24hr patch, Place 1 patch (14 mg total) onto the skin daily. (Patient not taking: Reported on 02/11/2021), Disp: 28 patch, Rfl: 0 .  phenytoin (DILANTIN) 100 MG ER capsule, Take 1 capsule (100 mg total) by mouth 3 (three) times daily., Disp: 90 capsule, Rfl: 2 .  sucralfate (CARAFATE) 1 g tablet, Take 1 tablet (1 g total) by mouth 4 (four) times daily. (Patient not taking: Reported on 02/11/2021), Disp: 120 tablet, Rfl: 1    Assessment & Plan:   Diarrhea / vomiting  Will send in lactobacillus And zofran for symptomaitc rx of the above.  COVID ?  Increase fluid intake.  Headahce - tyelnol every 4-6 hrs prn and alternate this with ibubrufen 800 mg q 8 hrly. Sinus pressure: use steam inhalation.  OTC -  Allegra / claritin.  Quarantine until gets PCR results.  Pt will need to come in wait in the car and get swabbed for flu and COVID test today  Pt verbalized understanding of such, to get to the office at today and get a curb side  test for the above. Acute Diarrhea ? Sec to viral Gastroenteritis will need to check for COVID and FLU . Will need to use probiotics after every loose stool Consider imodium  Pt advised a bland BRAT diet today.  Advised to call the office or go to the ER if she develops further abdominal pain crampign, any new onset of bleeding / black stools or  fresh red blood from any orifice,.  Pt verbalized understanding of such.  Problem List Items Addressed This Visit   None    No orders of the defined types  were placed in this encounter.    Meds ordered this encounter  Medications  . DISCONTD: Lactobacillus TABS    Sig: Take 1 each by mouth as needed. Take after every loose stool    Dispense:  30 tablet    Refill:  0  . Lactobacillus TABS    Sig: Take 1 each by mouth as needed. Take after every loose stool    Dispense:  30 tablet    Refill:  0  . ondansetron (ZOFRAN) 8 MG tablet    Sig: Take 1 tablet (8 mg total) by mouth every 8 (eight) hours as needed for nausea or vomiting.    Dispense:  20 tablet    Refill:  0     Follow up plan: No follow-ups on file.

## 2021-03-23 NOTE — Patient Instructions (Signed)
Bland Diet A bland diet consists of foods that are often soft and do not have a lot of fat, fiber, or extra seasonings. Foods without fat, fiber, or seasoning are easier for the body to digest. They are also less likely to irritate your mouth, throat, stomach, and other parts of your digestive system. A bland dietis sometimes called a BRAT diet. What is my plan? Your health care provider or food and nutrition specialist (dietitian) may recommend specific changes to your diet to prevent symptoms or to treat your symptoms. These changes may include: Eating small meals often. Cooking food until it is soft enough to chew easily. Chewing your food well. Drinking fluids slowly. Not eating foods that are very spicy, sour, or fatty. Not eating citrus fruits, such as oranges and grapefruit. What do I need to know about this diet? Eat a variety of foods from the bland diet food list. Do not follow a bland diet longer than needed. Ask your health care provider whether you should take vitamins or supplements. What foods can I eat? Grains  Hot cereals, such as cream of wheat. Rice. Bread, crackers, or tortillas madefrom refined white flour. Vegetables Canned or cooked vegetables. Mashed or boiled potatoes. Fruits  Bananas. Applesauce. Other types of cooked or canned fruit with the skin andseeds removed, such as canned peaches or pears. Meats and other proteins  Scrambled eggs. Creamy peanut butter or other nut butters. Lean, well-cookedmeats, such as chicken or fish. Tofu. Soups or broths. Dairy Low-fat dairy products, such as milk, cottage cheese, or yogurt. Beverages  Water. Herbal tea. Apple juice. Fats and oils Mild salad dressings. Canola or olive oil. Sweets and desserts Pudding. Custard. Fruit gelatin. Ice cream. The items listed above may not be a complete list of recommended foods and beverages. Contact a dietitian for more options. What foods are not recommended? Grains Whole grain  breads and cereals. Vegetables Raw vegetables. Fruits Raw fruits, especially citrus, berries, or dried fruits. Dairy Whole fat dairy foods. Beverages Caffeinated drinks. Alcohol. Seasonings and condiments Strongly flavored seasonings or condiments. Hot sauce. Salsa. Other foods Spicy foods. Fried foods. Sour foods, such as pickled or fermented foods. Foodswith high sugar content. Foods high in fiber. The items listed above may not be a complete list of foods and beverages to avoid. Contact a dietitian for more information. Summary A bland diet consists of foods that are often soft and do not have a lot of fat, fiber, or extra seasonings. Foods without fat, fiber, or seasoning are easier for the body to digest. Check with your health care provider to see how long you should follow this diet plan. It is not meant to be followed for long periods. This information is not intended to replace advice given to you by your health care provider. Make sure you discuss any questions you have with your healthcare provider. Document Revised: 09/13/2017 Document Reviewed: 09/13/2017 Elsevier Patient Education  2022 Elsevier Inc.  

## 2021-03-24 ENCOUNTER — Ambulatory Visit: Payer: Self-pay | Admitting: *Deleted

## 2021-03-24 ENCOUNTER — Other Ambulatory Visit: Payer: Self-pay

## 2021-03-24 ENCOUNTER — Other Ambulatory Visit: Payer: Medicaid Other

## 2021-03-24 ENCOUNTER — Telehealth: Payer: Self-pay

## 2021-03-24 DIAGNOSIS — Z1152 Encounter for screening for COVID-19: Secondary | ICD-10-CM

## 2021-03-24 NOTE — Telephone Encounter (Signed)
Copied from CRM [id number]. Topic: General - Other >> Mar 24, 2021  2:48 PM Pawlus, Maxine Glenn A wrote: Reason for CRM: Pt wanted to go over her lab results as soon as they are available, pt stated she does not have an appetite and wanted to know if they could be connected.

## 2021-03-24 NOTE — Telephone Encounter (Signed)
Patient notified that we will contact her as soon as the results come back.

## 2021-03-24 NOTE — Telephone Encounter (Signed)
Patient calling for Covid results from this morning done at the office. Results pending as of now. Discussed the patient drinking at least 5 glasses of water/juice daily and attempting small snacks throughout the day. Informed the patient it would most likely be tomorrow before we have the covid results.  No triage performed. Answered all questions at this time.

## 2021-03-25 LAB — NOVEL CORONAVIRUS, NAA: SARS-CoV-2, NAA: DETECTED — AB

## 2021-03-25 LAB — SARS-COV-2, NAA 2 DAY TAT

## 2021-03-26 ENCOUNTER — Ambulatory Visit: Payer: Self-pay | Admitting: *Deleted

## 2021-03-26 ENCOUNTER — Ambulatory Visit: Payer: Medicaid Other | Admitting: Nurse Practitioner

## 2021-03-26 MED ORDER — MULTI-VITAMIN/MINERALS PO TABS
1.0000 | ORAL_TABLET | Freq: Every day | ORAL | 0 refills | Status: DC
Start: 2021-03-26 — End: 2021-04-27

## 2021-03-26 MED ORDER — ENSURE COMPLETE PO LIQD: 237.0000 mL | Freq: Two times a day (BID) | ORAL | 0 refills | Status: DC

## 2021-03-26 NOTE — Telephone Encounter (Signed)
Patient is calling to report that she tested + COVID 7/27. Patient states her symptoms started 7/20 with diarrhea and sinus symptoms. Patient reports she has had improvement in her symptoms- she has the occasional productive cough- she can go a day without coughing and then have a 10 minute spell- coughing up sputum. Patient states she is having a hard time with her appetite- she is requesting advised for vitamins and an Rx for Ensure to supplement her diet until she her appetite returns. Patient has been advised of COVID protocols previously when notified of results.

## 2021-03-26 NOTE — Addendum Note (Signed)
Addended by: Rodman Pickle A on: 03/26/2021 12:56 PM   Modules accepted: Orders

## 2021-03-26 NOTE — Telephone Encounter (Signed)
Pt called in stating she has tested positive for covid and wanted to get some vitamins or something to help her get her appetite back. Please advise.  Reason for Disposition  [1] COVID-19 diagnosed by positive lab test (e.g., PCR, rapid self-test kit) AND [2] mild symptoms (e.g., cough, fever, others) AND [9] no complications or SOB  Answer Assessment - Initial Assessment Questions 1. COVID-19 DIAGNOSIS: "Who made your COVID-19 diagnosis?" "Was it confirmed by a positive lab test or self-test?" If not diagnosed by a doctor (or NP/PA), ask "Are there lots of cases (community spread) where you live?" Note: See public health department website, if unsure.     [facility]- yesterday 2. COVID-19 EXPOSURE: "Was there any known exposure to COVID before the symptoms began?" CDC Definition of close contact: within 6 feet (2 meters) for a total of 15 minutes or more over a 24-hour period.      unknown 3. ONSET: "When did the COVID-19 symptoms start?"      Last week- Wednesday last week 4. WORST SYMPTOM: "What is your worst symptom?" (e.g., cough, fever, shortness of breath, muscle aches)     Lack of Appetite 5. COUGH: "Do you have a cough?" If Yes, ask: "How bad is the cough?"       Yes- not bad now- but does move up [place]  6. FEVER: "Do you have a fever?" If Yes, ask: "What is your temperature, how was it measured, and when did it start?"     no 7. RESPIRATORY STATUS: "Describe your breathing?" (e.g., shortness of breath, wheezing, unable to speak)      Normal breathing 8. BETTER-SAME-WORSE: "Are you getting better, staying the same or getting worse compared to yesterday?"  If getting worse, ask, "In what way?"     Better- symptoms have improved 9. HIGH RISK DISEASE: "Do you have any chronic medical problems?" (e.g., asthma, heart or lung disease, weak immune system, obesity, etc.)     no 10. VACCINE: "Have you had the COVID-19 vaccine?" If Yes, ask: "Which one, how many shots, when did you get it?"        no 11. BOOSTER: "Have you received your COVID-19 booster?" If Yes, ask: "Which one and when did you get it?"       no 12. PREGNANCY: "Is there any chance you are pregnant?" "When was your last menstrual period?"       N/a 13. OTHER SYMPTOMS: "Do you have any other symptoms?"  (e.g., chills, fatigue, headache, loss of smell or taste, muscle pain, sore throat)       Lack of appetite 14. O2 SATURATION MONITOR:  "Do you use an oxygen saturation monitor (pulse oximeter) at home?" If Yes, ask "What is your reading (oxygen level) today?" "What is your usual oxygen saturation reading?" (e.g., 95%)       no  Protocols used: Coronavirus (COVID-19) Diagnosed or Suspected-A-AH

## 2021-03-26 NOTE — Telephone Encounter (Signed)
Patient aware of prescriptions called in. No further questions.

## 2021-03-31 ENCOUNTER — Ambulatory Visit: Payer: Self-pay | Admitting: *Deleted

## 2021-03-31 NOTE — Telephone Encounter (Signed)
Reason for Disposition  Patient is worried they have a sexually transmitted infection (STI)    Had intercourse without a condom.  Now clear discharge with odor  Answer Assessment - Initial Assessment Questions 1. SYMPTOM: "What's the main symptom you're concerned about?" (e.g., pain, itching, dryness)     I had intercourse 3-4 days ago.   No condom used.  I'm having a bad odor.  It's clear.  It smells bad.    I've had vaginal infections before.   No burning.   It's itching 2. LOCATION: "Where is the  itching located?" (e.g., inside/outside, left/right)     Around my vaginal area 3. ONSET: "When did the  discharge   start?"     It smells bad 4. PAIN: "Is there any pain?" If Yes, ask: "How bad is it?" (Scale: 1-10; mild, moderate, severe)     No burning 5. ITCHING: "Is there any itching?" If Yes, ask: "How bad is it?" (Scale: 1-10; mild, moderate, severe)     Some itching 6. CAUSE: "What do you think is causing the discharge?" "Have you had the same problem before? What happened then?"     STD possibly 7. OTHER SYMPTOMS: "Do you have any other symptoms?" (e.g., fever, itching, vaginal bleeding, pain with urination, injury to genital area, vaginal foreign body)     Itching, odor, clear discharge. 8. PREGNANCY: "Is there any chance you are pregnant?" "When was your last menstrual period?"     Not asked   She mentioned she had an appt tomorrow so she would discuss it with her then.  Protocols used: Vaginal Symptoms-A-AH

## 2021-03-31 NOTE — Telephone Encounter (Signed)
Noted  

## 2021-03-31 NOTE — Telephone Encounter (Signed)
Routing to Doyce Loose for tomorrows appointment.

## 2021-03-31 NOTE — Telephone Encounter (Signed)
I returned pt's call.  She is c/o having vaginal odor, clear discharge and itching after having intercourse without a condom 3-4 days ago.  She is concerned she has an STI.  She has not tried any OTC medications. She mentioned she has an appt tomorrow so "I'll talk with her tomorrow when I come in" then she ended the call.    After she hung up I noticed the appt for tomorrow is with Rodman Pickle, NP for a knee injection.  I'm sending a note to [name] letting Lauren know pt plans to talk with her regarding vaginal issues.  I sent my notes to [facility]

## 2021-04-01 ENCOUNTER — Other Ambulatory Visit: Payer: Self-pay

## 2021-04-01 ENCOUNTER — Encounter: Payer: Self-pay | Admitting: Nurse Practitioner

## 2021-04-01 ENCOUNTER — Ambulatory Visit (INDEPENDENT_AMBULATORY_CARE_PROVIDER_SITE_OTHER): Payer: Medicaid Other | Admitting: Nurse Practitioner

## 2021-04-01 VITALS — BP 128/83 | HR 108 | Temp 98.2°F | Wt 171.4 lb

## 2021-04-01 DIAGNOSIS — M1712 Unilateral primary osteoarthritis, left knee: Secondary | ICD-10-CM | POA: Diagnosis not present

## 2021-04-01 DIAGNOSIS — N898 Other specified noninflammatory disorders of vagina: Secondary | ICD-10-CM | POA: Diagnosis not present

## 2021-04-01 LAB — URINALYSIS, ROUTINE W REFLEX MICROSCOPIC
Bilirubin, UA: NEGATIVE
Glucose, UA: NEGATIVE
Ketones, UA: NEGATIVE
Leukocytes,UA: NEGATIVE
Nitrite, UA: NEGATIVE
RBC, UA: NEGATIVE
Specific Gravity, UA: 1.025 (ref 1.005–1.030)
Urobilinogen, Ur: 0.2 mg/dL (ref 0.2–1.0)
pH, UA: 5.5 (ref 5.0–7.5)

## 2021-04-01 LAB — WET PREP FOR TRICH, YEAST, CLUE
Clue Cell Exam: NEGATIVE
Trichomonas Exam: NEGATIVE
Yeast Exam: NEGATIVE

## 2021-04-01 NOTE — Assessment & Plan Note (Signed)
Note reviewed from [facility]. X-ray of left knee shows arthritis. Ultrasound did not show popliteal cyst. Will inject with kenalog and lidocaine. Discussed signs and symptoms of infection to watch for.  Follow-up with any concerns.

## 2021-04-01 NOTE — Progress Notes (Signed)
Established Patient Office Visit  Subjective:  Patient ID: Karina Anderson, female    DOB: 1971-03-07  Age: 50 y.o. MRN: [id number]  CC:  Chief Complaint  Patient presents with   Knee Pain   Vaginal Discharge    HPI [place] presents for left knee pain with planned injection and vaginal discharge and itching.   VAGINAL DISCHARGE  Duration: 3-4 days Discharge description:  chalky   Pruritus: yes Dysuria: no Malodorous: yes Urinary frequency: no Fevers: no Abdominal pain: no  Sexual activity:  sexually active, does not want STD testing History of sexually transmitted diseases: no Recent antibiotic use: no Context: worse  Treatments attempted: none   Past Medical History:  Diagnosis Date   Chronic lower back pain    Hydradenitis    Seizures (HCC)     Past Surgical History:  Procedure Laterality Date   DILATION AND CURETTAGE OF UTERUS     3 miscarriages   HYDRADENITIS EXCISION     R underarm   TUBAL LIGATION     TUMOR REMOVAL  07/2020   index finger on L hand    Family History  Problem Relation Age of Onset   Seizures Mother    Diabetes Mother    Heart attack Father    Hypertension Brother    Eczema Daughter    Anxiety disorder Maternal Grandmother    Hypertension Brother    Hypertension Brother    Hypertension Brother    Hypertension Brother     Social History   Socioeconomic History   Marital status: Single    Spouse name: Not on file   Number of children: Not on file   Years of education: Not on file   Highest education level: Not on file  Occupational History   Not on file  Tobacco Use   Smoking status: Every Day    Packs/day: 0.50    Types: Cigarettes   Smokeless tobacco: Never  Vaping Use   Vaping Use: Never used  Substance and Sexual Activity   Alcohol use: No   Drug use: No   Sexual activity: Not Currently  Other Topics Concern   Not on file  Social History Narrative   Not on file   Social Determinants of Health    Financial Resource Strain: Not on file  Food Insecurity: Not on file  Transportation Needs: Not on file  Physical Activity: Not on file  Stress: Not on file  Social Connections: Not on file  Intimate Partner Violence: Not on file    Outpatient Medications Prior to Visit  Medication Sig Dispense Refill   DULoxetine (CYMBALTA) 30 MG capsule TAKE 1 CAPSULE(30 MG) BY MOUTH DAILY 90 capsule 0   Multiple Vitamins-Minerals (MULTIVITAMIN WITH MINERALS) tablet Take 1 tablet by mouth daily. 30 tablet 0   famotidine (PEPCID) 20 MG tablet Take 1 tablet (20 mg total) by mouth 2 (two) times daily. (Patient not taking: No sig reported) 60 tablet 0   feeding supplement, ENSURE COMPLETE, (ENSURE COMPLETE) LIQD Take 237 mLs by mouth 2 (two) times daily between meals. (Patient not taking: Reported on 04/01/2021) 2844 mL 0   gabapentin (NEURONTIN) 100 MG capsule Take 1 capsule (100 mg total) by mouth 2 (two) times daily. Start one daily at night for 3 days then can take twice a day (Patient not taking: No sig reported) 90 capsule 3   HYDROcodone-acetaminophen (NORCO/VICODIN) 5-325 MG tablet Take 1 tablet by mouth every 6 (six) hours as needed. (Patient not taking: No  sig reported) 8 tablet 0   metoCLOPramide (REGLAN) 10 MG tablet Take 1 tablet (10 mg total) by mouth every 6 (six) hours as needed. (Patient not taking: No sig reported) 30 tablet 0   nicotine (NICODERM CQ) 14 mg/24hr patch Place 1 patch (14 mg total) onto the skin daily. (Patient not taking: No sig reported) 28 patch 0   ondansetron (ZOFRAN) 8 MG tablet Take 1 tablet (8 mg total) by mouth every 8 (eight) hours as needed for nausea or vomiting. (Patient not taking: Reported on 04/01/2021) 20 tablet 0   phenytoin (DILANTIN) 100 MG ER capsule Take 1 capsule (100 mg total) by mouth 3 (three) times daily. (Patient not taking: Reported on 04/01/2021) 90 capsule 2   sucralfate (CARAFATE) 1 g tablet Take 1 tablet (1 g total) by mouth 4 (four) times daily.  (Patient not taking: No sig reported) 120 tablet 1   bacitracin-polymyxin b (POLYSPORIN) ophthalmic ointment Place 1 application into the right eye every 12 (twelve) hours. apply to eye every 12 hours while awake 3.5 g 0   Lactobacillus TABS Take 1 each by mouth as needed. Take after every loose stool 30 tablet 0   No facility-administered medications prior to visit.    Allergies  Allergen Reactions   Citrus Hives   Sulfamethoxazole-Trimethoprim Hives   Butorphanol Hives   Butorphanol Tartrate Hives   Contrast Media [Iodinated Diagnostic Agents] Nausea And Vomiting   Food Other (See Comments)    Allergic to Citrus Fruits.   Ibuprofen    Ketorolac    Lorazepam Hives   Naproxen    Prochlorperazine Hives   Nsaids Rash    ROS Review of Systems  Constitutional: Negative.   HENT: Negative.    Respiratory: Negative.    Cardiovascular: Negative.   Gastrointestinal: Negative.   Genitourinary:  Positive for vaginal discharge. Negative for dysuria and frequency.  Musculoskeletal:  Positive for arthralgias (left knee pain).  Skin: Negative.   Neurological: Negative.      Objective:    Physical Exam Vitals and nursing note reviewed.  Constitutional:      General: She is not in acute distress.    Appearance: Normal appearance.  HENT:     Head: Normocephalic and atraumatic.  Eyes:     Conjunctiva/sclera: Conjunctivae normal.  Cardiovascular:     Rate and Rhythm: Normal rate and regular rhythm.     Pulses: Normal pulses.     Heart sounds: Normal heart sounds.  Pulmonary:     Effort: Pulmonary effort is normal.     Breath sounds: Normal breath sounds.  Musculoskeletal:     Cervical back: Normal range of motion.  Skin:    General: Skin is warm and dry.  Neurological:     General: No focal deficit present.     Mental Status: She is alert and oriented to person, place, and time.  Psychiatric:        Mood and Affect: Mood normal.        Behavior: Behavior normal.         Thought Content: Thought content normal.        Judgment: Judgment normal.    BP 128/83   Pulse (!) 108   Temp 98.2 F (36.8 C) (Oral)   Wt 171 lb 6.4 oz (77.7 kg)   LMP 03/09/2021 (Exact Date)   SpO2 98%   BMI 28.52 kg/m  Wt Readings from Last 3 Encounters:  04/01/21 171 lb 6.4 oz (77.7 kg)  03/11/21 173  lb (78.5 kg)  02/11/21 173 lb 9.6 oz (78.7 kg)     Health Maintenance Due  Topic Date Due   Pneumococcal Vaccine 26-2 Years old (1 - PCV) Never done   INFLUENZA VACCINE  03/29/2021    There are no preventive care reminders to display for this patient.  Lab Results  Component Value Date   TSH 1.790 12/31/2020   Lab Results  Component Value Date   WBC 9.4 12/09/2020   HGB 12.4 12/09/2020   HCT 37.0 12/09/2020   MCV 82.4 12/09/2020   PLT 326 12/09/2020   Lab Results  Component Value Date   NA 134 (L) 12/09/2020   K 3.9 12/09/2020   CO2 22 12/09/2020   GLUCOSE 166 (H) 12/09/2020   BUN 16 12/09/2020   CREATININE 0.65 12/09/2020   BILITOT 0.5 12/09/2020   ALKPHOS 68 12/09/2020   AST 18 12/09/2020   ALT 15 12/09/2020   PROT 7.8 12/09/2020   ALBUMIN 3.8 12/09/2020   CALCIUM 9.2 12/09/2020   ANIONGAP 9 12/09/2020   EGFR 94 12/01/2020   Lab Results  Component Value Date   CHOL 249 (H) 12/31/2020   Lab Results  Component Value Date   HDL 49 12/31/2020   Lab Results  Component Value Date   LDLCALC 158 (H) 12/31/2020   Lab Results  Component Value Date   TRIG 228 (H) 12/31/2020   Lab Results  Component Value Date   CHOLHDL 4.7 03/12/2016   Lab Results  Component Value Date   HGBA1C 5.4 12/31/2020      Assessment & Plan:   Problem List Items Addressed This Visit       Musculoskeletal and Integument   Arthritis of left knee    Note reviewed from [facility]. X-ray of left knee shows arthritis. Ultrasound did not show popliteal cyst. Will inject with kenalog and lidocaine. Discussed signs and symptoms of infection to watch for.  Follow-up  with any concerns.        Other Visit Diagnoses     Vaginal odor    -  Primary   u/a and wet prep negative   Relevant Orders   WET PREP FOR TRICH, YEAST, CLUE (Completed)   Urinalysis, Routine w reflex microscopic (Completed)   Vaginal discharge       u/a and wet prep negative. Will order chlamydia and gonorrhea and treat as needed   Relevant Orders   WET PREP FOR [place], YEAST, CLUE (Completed)   Urinalysis, Routine w reflex microscopic (Completed)   Chlamydia/GC NAA, Confirmation      Procedure: Left  Knee Intraarticular Steroid Injection        Diagnosis: Arthritis of left knee   Provider: Vance Peper, NP Consent:  Risks, benefits, and alternative treatments discussed and all questions were answered.  Patient elected to proceed and verbal consent obtained.  Description: Area prepped and draped using  sterile technique. Using a anterior/lateral approach, a mixture of 4 cc of 1% lidocaine & 1 cc of Kenalog 40 was injected into knee joint. A bandage was then placed over the injection site  Complications: none Post Procedure Instructions: Wound care instructions discussed and patient was instructed to keep area clean and dry.  Signs and symptoms of infection discussed, patient agrees to contact the office ASAP should they occur.  Follow Up: Return if symptoms worsen or fail to improve.   No orders of the defined types were placed in this encounter.   Follow-up: Return if symptoms worsen or  fail to improve.    Charyl Dancer, NP

## 2021-04-03 LAB — CHLAMYDIA/GC NAA, CONFIRMATION
Chlamydia trachomatis, NAA: NEGATIVE
Neisseria gonorrhoeae, NAA: NEGATIVE

## 2021-04-05 MED ORDER — METRONIDAZOLE 500 MG PO TABS: 500.0000 mg | ORAL_TABLET | Freq: Two times a day (BID) | ORAL | 0 refills | Status: AC

## 2021-04-05 NOTE — Progress Notes (Deleted)
No-show to initial evaluation °

## 2021-04-05 NOTE — Addendum Note (Signed)
Addended by: Rodman Pickle A on: 04/05/2021 08:47 AM   Modules accepted: Orders

## 2021-04-07 ENCOUNTER — Ambulatory Visit: Payer: Medicaid Other | Admitting: Pain Medicine

## 2021-04-07 DIAGNOSIS — M899 Disorder of bone, unspecified: Secondary | ICD-10-CM | POA: Insufficient documentation

## 2021-04-07 DIAGNOSIS — Z789 Other specified health status: Secondary | ICD-10-CM | POA: Insufficient documentation

## 2021-04-07 DIAGNOSIS — Z79899 Other long term (current) drug therapy: Secondary | ICD-10-CM | POA: Insufficient documentation

## 2021-04-07 DIAGNOSIS — G894 Chronic pain syndrome: Secondary | ICD-10-CM | POA: Insufficient documentation

## 2021-04-25 ENCOUNTER — Other Ambulatory Visit: Payer: Self-pay | Admitting: Nurse Practitioner

## 2021-04-25 NOTE — Telephone Encounter (Signed)
Requested medication (s) are due for refill today: yes  Requested medication (s) are on the active medication list: yes  Last refill:  12/01/20  Future visit scheduled: yes  Notes to clinic:  med not delegated to NT to RF   Requested Prescriptions  Pending Prescriptions Disp Refills   phenytoin (DILANTIN) 100 MG ER capsule [Pharmacy Med Name: PHENYTOIN SODIUM 100MG  EXT CAPSULES] 90 capsule 2    Sig: TAKE 1 CAPSULE(100 MG) BY MOUTH THREE TIMES DAILY     Not Delegated - Neurology:  Anticonvulsants - phenytoin Failed - 04/25/2021  3:38 AM      Failed - This refill cannot be delegated      Failed - Phenytoin (serum) in normal range and within 360 days    Phenytoin, Total  Date Value Ref Range Status  03/12/2016 5.3 (L) 10.0 - 20.0 ug/mL Final    Comment:    (NOTE)                                Detection Limit =  0.8                          <0.8 Indicates None Detected    Phenytoin (Dilantin), Serum  Date Value Ref Range Status  12/31/2020 9.0 (L) 10.0 - 20.0 ug/mL Final    Comment:                                    Detection Limit =  0.8                           <0.8 Indicates None Detected    Phenytoin, Free  Date Value Ref Range Status  03/12/2016 0.6 (L) 1.0 - 2.0 ug/mL Final    Comment:    (NOTE)                                Detection Limit = 0.5 Performed At: [facility] [address] [place], [place] [id number] MD Mila Homer           Passed - ALT in normal range and within 360 days    ALT  Date Value Ref Range Status  12/09/2020 15 0 - 44 U/L Final          Passed - AST in normal range and within 360 days    AST  Date Value Ref Range Status  12/09/2020 18 15 - 41 U/L Final          Passed - HGB in normal range and within 360 days    Hemoglobin  Date Value Ref Range Status  12/09/2020 12.4 12.0 - 15.0 g/dL Final  12/11/2020 02/26/1600 11.1 - 15.9 g/dL Final          Passed - HCT in normal range and within 360 days     HCT  Date Value Ref Range Status  12/09/2020 37.0 36.0 - 46.0 % Final   Hematocrit  Date Value Ref Range Status  12/01/2020 36.1 34.0 - 46.6 % Final          Passed - PLT in normal range and within 360 days    Platelets  Date Value Ref Range Status  12/09/2020 326 150 - 400 K/uL Final  12/01/2020 319 150 - 450 x10E3/uL Final          Passed - WBC in normal range and within 360 days    WBC  Date Value Ref Range Status  12/09/2020 9.4 4.0 - 10.5 K/uL Final          Passed - Valid encounter within last 12 months    Recent Outpatient Visits           3 weeks ago Vaginal odor   [facility] McElwee, Lauren A, NP   1 month ago Diarrhea, unspecified type   [facility] Vigg, Avanti, MD   2 months ago Swelling of right eyelid   [facility] McElwee, Lauren A, NP   3 months ago Pain in gums   [facility] McElwee, Lauren A, NP   3 months ago Routine general medical examination at a health care facility   [facility], Jake Church, NP       Future Appointments             In 2 weeks McElwee, Jake Church, NP [facility]

## 2021-04-26 ENCOUNTER — Ambulatory Visit: Payer: Self-pay | Admitting: *Deleted

## 2021-04-26 ENCOUNTER — Other Ambulatory Visit: Payer: Self-pay | Admitting: Nurse Practitioner

## 2021-04-26 NOTE — Telephone Encounter (Signed)
Routing to provider for appointment tomorrow.

## 2021-04-26 NOTE — Telephone Encounter (Signed)
Pt called in c/o having lower back spasms and dizziness when she stands up.  This started 5 days ago.   No injuries to her back.   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   I made her an appt to see Rodman Pickle, NP for 04/27/2021 at 9:20 in office visit at her request.     I instructed her to go to the urgent care or ED if the dizziness or spasms got worse.   She was agreeable to this plan.  I sent my notes to [facility].

## 2021-04-26 NOTE — Telephone Encounter (Signed)
Requested medication (s) are due for refill today: already ordered today   Requested medication (s) are on the active medication list: yes   Last refill:  04/26/21 #90 2 refills  Future visit scheduled: tomorrow   Notes to clinic:  not delegated per protocol     Requested Prescriptions  Pending Prescriptions Disp Refills   phenytoin (DILANTIN) 100 MG ER capsule [Pharmacy Med Name: PHENYTOIN SODIUM 100MG  EXT CAPSULES] 90 capsule 2    Sig: TAKE 1 CAPSULE(100 MG) BY MOUTH THREE TIMES DAILY     Not Delegated - Neurology:  Anticonvulsants - phenytoin Failed - 04/26/2021  9:34 AM      Failed - This refill cannot be delegated      Failed - Phenytoin (serum) in normal range and within 360 days    Phenytoin, Total  Date Value Ref Range Status  03/12/2016 5.3 (L) 10.0 - 20.0 ug/mL Final    Comment:    (NOTE)                                Detection Limit =  0.8                          <0.8 Indicates None Detected    Phenytoin (Dilantin), Serum  Date Value Ref Range Status  12/31/2020 9.0 (L) 10.0 - 20.0 ug/mL Final    Comment:                                    Detection Limit =  0.8                           <0.8 Indicates None Detected    Phenytoin, Free  Date Value Ref Range Status  03/12/2016 0.6 (L) 1.0 - 2.0 ug/mL Final    Comment:    (NOTE)                                Detection Limit = 0.5 Performed At: [facility] [address] [place], [place] [id number] MD Mila Homer           Passed - ALT in normal range and within 360 days    ALT  Date Value Ref Range Status  12/09/2020 15 0 - 44 U/L Final          Passed - AST in normal range and within 360 days    AST  Date Value Ref Range Status  12/09/2020 18 15 - 41 U/L Final          Passed - HGB in normal range and within 360 days    Hemoglobin  Date Value Ref Range Status  12/09/2020 12.4 12.0 - 15.0 g/dL Final  12/11/2020 53/29/9242 11.1 - 15.9 g/dL Final          Passed -  HCT in normal range and within 360 days    HCT  Date Value Ref Range Status  12/09/2020 37.0 36.0 - 46.0 % Final   Hematocrit  Date Value Ref Range Status  12/01/2020 36.1 34.0 - 46.6 % Final          Passed - PLT in normal range and within 360 days  Platelets  Date Value Ref Range Status  12/09/2020 326 150 - 400 K/uL Final  12/01/2020 319 150 - 450 x10E3/uL Final          Passed - WBC in normal range and within 360 days    WBC  Date Value Ref Range Status  12/09/2020 9.4 4.0 - 10.5 K/uL Final          Passed - Valid encounter within last 12 months    Recent Outpatient Visits           3 weeks ago Vaginal odor   [facility] McElwee, Lauren A, NP   1 month ago Diarrhea, unspecified type   [facility] Vigg, Avanti, MD   2 months ago Swelling of right eyelid   [facility] McElwee, Lauren A, NP   3 months ago Pain in gums   [facility] McElwee, Lauren A, NP   3 months ago Routine general medical examination at a health care facility   [facility], Jake Church, NP       Future Appointments             Tomorrow Gerre Scull, NP [facility]   In 2 weeks McElwee, Jake Church, NP [facility]

## 2021-04-26 NOTE — Telephone Encounter (Signed)
Please see if she has enough to get to tomorrow

## 2021-04-26 NOTE — Telephone Encounter (Signed)
Tried to call patient, no answer, unable to leave a message. 

## 2021-04-26 NOTE — Telephone Encounter (Signed)
Reason for Disposition  [1] Dizziness caused by heat exposure, sudden standing, or poor fluid intake AND [2] no improvement after 2 hours of rest and fluids  Answer Assessment - Initial Assessment Questions 1. DESCRIPTION: "Describe your dizziness."     I'm having dizziness for 5 days and the back spasms.  I feel real dizzy when I go to the bathroom and when I eat something it doesn't help the dizziness.   I'm having back spasms too.   I haven't done anything to hurt my back.   My lower back is having spasms.   In [place] I had an MRI and it showed degeneration.    I was put on something for my back.  I think a muscle relaxer.   2. LIGHTHEADED: "Do you feel lightheaded?" (e.g., somewhat faint, woozy, weak upon standing)     If I stand up I feel dizzy.    I hold onto the walls to walk. 3. VERTIGO: "Do you feel like either you or the room is spinning or tilting?" (i.e. vertigo)     No 4. SEVERITY: "How bad is it?"  "Do you feel like you are going to faint?" "Can you stand and walk?"   - MILD: Feels slightly dizzy, but walking normally.   - MODERATE: Feels unsteady when walking, but not falling; interferes with normal activities (e.g., school, work).   - SEVERE: Unable to walk without falling, or requires assistance to walk without falling; feels like passing out now.      I'm holding onto the walls to walk.    5. ONSET:  "When did the dizziness begin?"     5 days now. I had Covid last month.   I don't think any of this is from that.    6. AGGRAVATING FACTORS: "Does anything make it worse?" (e.g., standing, change in head position)     Laying down it spasms.   It feel swollen a little bit. 7. HEART RATE: "Can you tell me your heart rate?" "How many beats in 15 seconds?"  (Note: not all patients can do this)       Not asked 8. CAUSE: "What do you think is causing the dizziness?"     I don't know 9. RECURRENT SYMPTOM: "Have you had dizziness before?" If Yes, ask: "When was the last time?" "What  happened that time?"     No 10. OTHER SYMPTOMS: "Do you have any other symptoms?" (e.g., fever, chest pain, vomiting, diarrhea, bleeding)       Back spasms and dizziness 11. PREGNANCY: "Is there any chance you are pregnant?" "When was your last menstrual period?"       Not asked due to age  Protocols used: Dizziness - Lightheadedness-A-AH

## 2021-04-27 ENCOUNTER — Encounter: Payer: Self-pay | Admitting: Nurse Practitioner

## 2021-04-27 ENCOUNTER — Ambulatory Visit (INDEPENDENT_AMBULATORY_CARE_PROVIDER_SITE_OTHER): Payer: Medicaid Other | Admitting: Nurse Practitioner

## 2021-04-27 ENCOUNTER — Other Ambulatory Visit: Payer: Self-pay

## 2021-04-27 VITALS — BP 118/83 | HR 103 | Temp 98.2°F | Wt 169.0 lb

## 2021-04-27 DIAGNOSIS — G8929 Other chronic pain: Secondary | ICD-10-CM

## 2021-04-27 DIAGNOSIS — B356 Tinea cruris: Secondary | ICD-10-CM | POA: Diagnosis not present

## 2021-04-27 DIAGNOSIS — M5442 Lumbago with sciatica, left side: Secondary | ICD-10-CM

## 2021-04-27 DIAGNOSIS — M5441 Lumbago with sciatica, right side: Secondary | ICD-10-CM | POA: Diagnosis not present

## 2021-04-27 DIAGNOSIS — R42 Dizziness and giddiness: Secondary | ICD-10-CM | POA: Diagnosis not present

## 2021-04-27 MED ORDER — CLOTRIMAZOLE 1 % EX CREA
1.0000 | TOPICAL_CREAM | Freq: Two times a day (BID) | CUTANEOUS | 0 refills | Status: DC
Start: 2021-04-27 — End: 2022-08-14

## 2021-04-27 MED ORDER — FLUTICASONE PROPIONATE 50 MCG/ACT NA SUSP: 2.0000 | Freq: Every day | NASAL | 6 refills | Status: DC

## 2021-04-27 MED ORDER — CYCLOBENZAPRINE HCL 10 MG PO TABS: 10.0000 mg | ORAL_TABLET | Freq: Three times a day (TID) | ORAL | 0 refills | Status: DC | PRN

## 2021-04-27 NOTE — Assessment & Plan Note (Signed)
Chronic, exacerbated. Stated the cymbalta is not helping and has stopped taking it the past few days. Encouraged her to continue the cymbalta as it has helped and will add flexeril for back spasms prn. Discussed side effects and possible sedation and not to drive after taking a dose. Follow up in 4 weeks.

## 2021-04-27 NOTE — Telephone Encounter (Signed)
noted 

## 2021-04-27 NOTE — Progress Notes (Signed)
Acute Office Visit  Subjective:    Patient ID: Karina Anderson, female    DOB: 1971-04-28, 50 y.o.   MRN: [id number]  Chief Complaint  Patient presents with   Dizziness    Patient states she is has been having issues since last week.    Spasms    HPI Patient is in today for dizziness, back spasms, and a rash in her groin.   DIZZINESS  Duration: weeks Description of symptoms: lightheaded Duration of episode: hours Dizziness frequency: no history of the same Provoking factors:  standing Aggravating factors:   standing Triggered by rolling over in bed: no Triggered by bending over: yes Aggravated by head movement: no Aggravated by exertion, coughing, loud noises: no Recent head injury: no Recent or current viral symptoms: yes History of vasovagal episodes: no Nausea: yes Vomiting: no Tinnitus: no Hearing loss: no Aural fullness: yes Headache: no Photophobia/phonophobia: no Unsteady gait: no Postural instability: no Diplopia, dysarthria, dysphagia or weakness: no Related to exertion: no Pallor: no Diaphoresis: no Dyspnea: no Chest pain: no  RASH  Duration:  weeks  Location: groin  Itching: yes Burning: yes Redness: yes Oozing: no Scaling: no Blisters: no Painful: yes Fevers: no Change in detergents/soaps/personal care products: no Recent illness: no Recent travel:no History of same: no Context: worse Alleviating factors: nothing Treatments attempted: vagisil  Shortness of breath: no  Throat/tongue swelling: no Myalgias/arthralgias: yes, chronic back pain  BACK SPASMS  Pain has increased to her back and she has been having intermittent back spasms. They have kept her awake the past 2 nights. Back spasms worse with bending and lifting, and the pain radiates down both legs to her ankles. She states that the cymbalta is not working anymore and her body has become used to it. She has stopped taking her cymbalta.     Past Medical History:  Diagnosis  Date   Chronic lower back pain    Hydradenitis    Seizures ([place])     Past Surgical History:  Procedure Laterality Date   DILATION AND CURETTAGE OF UTERUS     3 miscarriages   HYDRADENITIS EXCISION     R underarm   TUBAL LIGATION     TUMOR REMOVAL  07/2020   index finger on L hand    Family History  Problem Relation Age of Onset   Seizures Mother    Diabetes Mother    Heart attack Father    Hypertension Brother    Eczema Daughter    Anxiety disorder Maternal Grandmother    Hypertension Brother    Hypertension Brother    Hypertension Brother    Hypertension Brother     Social History   Socioeconomic History   Marital status: Single    Spouse name: Not on file   Number of children: Not on file   Years of education: Not on file   Highest education level: Not on file  Occupational History   Not on file  Tobacco Use   Smoking status: Every Day    Packs/day: 0.50    Types: Cigarettes   Smokeless tobacco: Never  Vaping Use   Vaping Use: Never used  Substance and Sexual Activity   Alcohol use: No   Drug use: No   Sexual activity: Not Currently  Other Topics Concern   Not on file  Social History Narrative   Not on file   Social Determinants of Health   Financial Resource Strain: Not on file  Food Insecurity: Not on file  Transportation Needs: Not on file  Physical Activity: Not on file  Stress: Not on file  Social Connections: Not on file  Intimate Partner Violence: Not on file    Outpatient Medications Prior to Visit  Medication Sig Dispense Refill   phenytoin (DILANTIN) 100 MG ER capsule TAKE 1 CAPSULE(100 MG) BY MOUTH THREE TIMES DAILY 90 capsule 2   DULoxetine (CYMBALTA) 30 MG capsule TAKE 1 CAPSULE(30 MG) BY MOUTH DAILY (Patient not taking: Reported on 04/27/2021) 90 capsule 0   ondansetron (ZOFRAN) 8 MG tablet Take 1 tablet (8 mg total) by mouth every 8 (eight) hours as needed for nausea or vomiting. (Patient not taking: No sig reported) 20 tablet 0    famotidine (PEPCID) 20 MG tablet Take 1 tablet (20 mg total) by mouth 2 (two) times daily. (Patient not taking: No sig reported) 60 tablet 0   feeding supplement, ENSURE COMPLETE, (ENSURE COMPLETE) LIQD Take 237 mLs by mouth 2 (two) times daily between meals. (Patient not taking: No sig reported) 2844 mL 0   gabapentin (NEURONTIN) 100 MG capsule Take 1 capsule (100 mg total) by mouth 2 (two) times daily. Start one daily at night for 3 days then can take twice a day (Patient not taking: No sig reported) 90 capsule 3   HYDROcodone-acetaminophen (NORCO/VICODIN) 5-325 MG tablet Take 1 tablet by mouth every 6 (six) hours as needed. (Patient not taking: No sig reported) 8 tablet 0   metoCLOPramide (REGLAN) 10 MG tablet Take 1 tablet (10 mg total) by mouth every 6 (six) hours as needed. (Patient not taking: No sig reported) 30 tablet 0   Multiple Vitamins-Minerals (MULTIVITAMIN WITH MINERALS) tablet Take 1 tablet by mouth daily. (Patient not taking: Reported on 04/27/2021) 30 tablet 0   nicotine (NICODERM CQ) 14 mg/24hr patch Place 1 patch (14 mg total) onto the skin daily. (Patient not taking: No sig reported) 28 patch 0   sucralfate (CARAFATE) 1 g tablet Take 1 tablet (1 g total) by mouth 4 (four) times daily. (Patient not taking: No sig reported) 120 tablet 1   No facility-administered medications prior to visit.    Allergies  Allergen Reactions   Citrus Hives   Sulfamethoxazole-Trimethoprim Hives   Butorphanol Hives   Butorphanol Tartrate Hives   Contrast Media [Iodinated Diagnostic Agents] Nausea And Vomiting   Food Other (See Comments)    Allergic to Citrus Fruits.   Ibuprofen    Ketorolac    Lorazepam Hives   Naproxen    Prochlorperazine Hives   Nsaids Rash    Review of Systems  Constitutional:  Positive for fatigue. Negative for fever.  HENT:  Positive for congestion, ear pain (right ear fullness) and sneezing. Negative for sore throat.   Eyes: Negative.   Respiratory:  Positive  for cough. Negative for shortness of breath.   Cardiovascular: Negative.   Gastrointestinal:  Positive for nausea. Negative for abdominal pain, constipation and diarrhea.  Endocrine: Negative.   Genitourinary: Negative.   Musculoskeletal:  Positive for arthralgias (left knee pain) and back pain.  Skin:  Positive for rash (groin).  Neurological:  Positive for dizziness and light-headedness. Negative for syncope and headaches.      Objective:    Physical Exam Vitals and nursing note reviewed.  Constitutional:      General: She is not in acute distress.    Appearance: Normal appearance.  HENT:     Head: Normocephalic.     Right Ear: Tympanic membrane, ear canal and external ear normal.  Left Ear: Ear canal and external ear normal. A middle ear effusion is present. Tympanic membrane is not erythematous.  Eyes:     Conjunctiva/sclera: Conjunctivae normal.  Cardiovascular:     Rate and Rhythm: Normal rate and regular rhythm.     Pulses: Normal pulses.     Heart sounds: Normal heart sounds.  Pulmonary:     Effort: Pulmonary effort is normal.     Breath sounds: Normal breath sounds.  Musculoskeletal:        General: Tenderness (lower back, midline) present. Normal range of motion.     Cervical back: Normal range of motion.  Skin:    General: Skin is warm.     Findings: Rash (redness/irritation to bilateral labia) present.  Neurological:     General: No focal deficit present.     Mental Status: She is alert and oriented to person, place, and time.     Comments: Negative dix-hallpike maneuver bilaterally   Psychiatric:        Mood and Affect: Mood normal.        Behavior: Behavior normal.        Thought Content: Thought content normal.        Judgment: Judgment normal.    BP 118/83   Pulse (!) 103   Temp 98.2 F (36.8 C) (Oral)   Wt 169 lb (76.7 kg)   SpO2 98%   BMI 28.12 kg/m  Wt Readings from Last 3 Encounters:  04/27/21 169 lb (76.7 kg)  04/01/21 171 lb 6.4 oz  (77.7 kg)  03/11/21 173 lb (78.5 kg)    Health Maintenance Due  Topic Date Due   Pneumococcal Vaccine 24-45 Years old (1 - PCV) Never done   INFLUENZA VACCINE  03/29/2021    There are no preventive care reminders to display for this patient.   Lab Results  Component Value Date   TSH 1.790 12/31/2020   Lab Results  Component Value Date   WBC 9.4 12/09/2020   HGB 12.4 12/09/2020   HCT 37.0 12/09/2020   MCV 82.4 12/09/2020   PLT 326 12/09/2020   Lab Results  Component Value Date   NA 134 (L) 12/09/2020   K 3.9 12/09/2020   CO2 22 12/09/2020   GLUCOSE 166 (H) 12/09/2020   BUN 16 12/09/2020   CREATININE 0.65 12/09/2020   BILITOT 0.5 12/09/2020   ALKPHOS 68 12/09/2020   AST 18 12/09/2020   ALT 15 12/09/2020   PROT 7.8 12/09/2020   ALBUMIN 3.8 12/09/2020   CALCIUM 9.2 12/09/2020   ANIONGAP 9 12/09/2020   EGFR 94 12/01/2020   Lab Results  Component Value Date   CHOL 249 (H) 12/31/2020   Lab Results  Component Value Date   HDL 49 12/31/2020   Lab Results  Component Value Date   LDLCALC 158 (H) 12/31/2020   Lab Results  Component Value Date   TRIG 228 (H) 12/31/2020   Lab Results  Component Value Date   CHOLHDL 4.7 03/12/2016   Lab Results  Component Value Date   HGBA1C 5.4 12/31/2020       Assessment & Plan:   Problem List Items Addressed This Visit       Nervous and Auditory   Chronic midline low back pain with bilateral sciatica - Primary    Chronic, exacerbated. Stated the cymbalta is not helping and has stopped taking it the past few days. Encouraged her to continue the cymbalta as it has helped and will add flexeril for back  spasms prn. Discussed side effects and possible sedation and not to drive after taking a dose. Follow up in 4 weeks.       Relevant Medications   cyclobenzaprine (FLEXERIL) 10 MG tablet   Other Visit Diagnoses     Dizziness       With recent covid-19 infection and fluid behind left eardrum, will treat with flonase  and OTC zyrtec/claritin. Increase fluids. F/U in 2 weeks.    Tinea cruris       Will treat with clotrimazole twice a day. Discussed using dove soap and not scented soap. Keep skin dry. F/U if symptoms worsen.    Relevant Medications   clotrimazole (LOTRIMIN) 1 % cream        Meds ordered this encounter  Medications   fluticasone (FLONASE) 50 MCG/ACT nasal spray    Sig: Place 2 sprays into both nostrils daily.    Dispense:  16 g    Refill:  6   cyclobenzaprine (FLEXERIL) 10 MG tablet    Sig: Take 1 tablet (10 mg total) by mouth 3 (three) times daily as needed for muscle spasms.    Dispense:  30 tablet    Refill:  0   clotrimazole (LOTRIMIN) 1 % cream    Sig: Apply 1 application topically 2 (two) times daily.    Dispense:  30 g    Refill:  0      Charyl Dancer, NP

## 2021-05-05 ENCOUNTER — Telehealth: Payer: Self-pay | Admitting: Nurse Practitioner

## 2021-05-05 NOTE — Telephone Encounter (Signed)
Pt is came in stating that the Rx cyclobenzaprine (FLEXERIL) 10 MG is not working on her back pain and would like to see if she can get something else.  Pharm:  [facility].

## 2021-05-06 ENCOUNTER — Ambulatory Visit: Payer: Self-pay | Admitting: *Deleted

## 2021-05-06 NOTE — Telephone Encounter (Signed)
Just an FYI to provider.

## 2021-05-06 NOTE — Telephone Encounter (Signed)
Abscess on lower right groin near the vagina right of the majora labia. Hard knot that is painful unsure of size. Is not draining at this time. Has known condition Hidradenitis which she usually has to have the cysts drained. No fever at this time. Patient advised ED for evaluation and treatment. Patient agreed.  Reason for Disposition  SEVERE pain (e.g., excruciating)  Answer Assessment - Initial Assessment Questions 1. APPEARANCE of BOIL: "What does the boil look like?"      Boil is hard  2. LOCATION: "Where is the boil located?"      Lower right groin by the vagina next to labia majora 3. NUMBER: "How many boils are there?"      one 4. SIZE: "How big is the boil?" (e.g., inches, cm; compare to size of a coin or other object)     Maybe a peanut or grape 5. ONSET: "When did the boil start?"     Last week 6. PAIN: "Is there any pain?" If Yes, ask: "How bad is the pain?"   (Scale 1-10; or mild, moderate, severe)     severe 7. FEVER: "Do you have a fever?" If Yes, ask: "What is it, how was it measured, and when did it start?"      no 8. SOURCE: "Have you been around anyone with boils or other Staph infections?" "Have you ever had boils before?"     Yes has Hidradenitis condition 9. OTHER SYMPTOMS: "Do you have any other symptoms?" (e.g., shaking chills, weakness, rash elsewhere on body)     no 10. PREGNANCY: "Is there any chance you are pregnant?" "When was your last menstrual period?"       Did not ask  Protocols used: Boil (Skin Abscess)-A-AH

## 2021-05-06 NOTE — Telephone Encounter (Signed)
She will need an appointment for this. She is scheduled to see me next Friday, she can keep that or move it up sooner.

## 2021-05-06 NOTE — Telephone Encounter (Signed)
Patient scheduled an appointment for 05/10/21

## 2021-05-06 NOTE — Telephone Encounter (Signed)
noted 

## 2021-05-10 ENCOUNTER — Encounter: Payer: Self-pay | Admitting: Nurse Practitioner

## 2021-05-10 ENCOUNTER — Other Ambulatory Visit: Payer: Self-pay

## 2021-05-10 ENCOUNTER — Ambulatory Visit (INDEPENDENT_AMBULATORY_CARE_PROVIDER_SITE_OTHER): Payer: Medicaid Other | Admitting: Nurse Practitioner

## 2021-05-10 VITALS — BP 100/70 | HR 108 | Temp 98.4°F | Ht 65.0 in | Wt 169.0 lb

## 2021-05-10 DIAGNOSIS — L0292 Furuncle, unspecified: Secondary | ICD-10-CM

## 2021-05-10 DIAGNOSIS — M5441 Lumbago with sciatica, right side: Secondary | ICD-10-CM | POA: Diagnosis not present

## 2021-05-10 DIAGNOSIS — G8929 Other chronic pain: Secondary | ICD-10-CM

## 2021-05-10 DIAGNOSIS — M5442 Lumbago with sciatica, left side: Secondary | ICD-10-CM | POA: Diagnosis not present

## 2021-05-10 MED ORDER — PREGABALIN 75 MG PO CAPS: 75.0000 mg | ORAL_CAPSULE | Freq: Two times a day (BID) | ORAL | 1 refills | Status: DC

## 2021-05-10 MED ORDER — DOXYCYCLINE HYCLATE 100 MG PO TABS: 100.0000 mg | ORAL_TABLET | Freq: Two times a day (BID) | ORAL | 0 refills | Status: DC

## 2021-05-10 NOTE — Progress Notes (Signed)
Acute Office Visit  Subjective:    Patient ID: Karina Anderson, female    DOB: Mar 13, 1971, 50 y.o.   MRN: [id number]  Chief Complaint  Patient presents with   vaginal abcess    Started last week.    Back Pain    HPI Patient is in today for abscess to right inner thigh and pubic region. Also following up on back pain  ABSCESS Duration: weeks Location: right inner thigh, right groin Pain:  yes Quality:  sharp Severity: moderate Redness:  yes Swelling:  yes Warmth:  no Oozing:  yes Pus:  no Treatments attempted:warm compresses Past similar infections:  yes Past MRSA skin infections:  no History of trauma in area:  no Fevers:  no Nausea/vomiting:  no  BACK PAIN Duration: months Mechanism of injury: after childbirth with her last baby Location: midline and low back Onset: gradual Severity: 10/10 Quality: sharp, aching Frequency: constant Radiation: R leg above the knee and L leg above the knee Aggravating factors: lifting, movement, walking, laying, and bending Alleviating factors: nothing. Cymbalta helped for several months, however not anymore Status: worse Treatments attempted: ice, heat, tylenol, cymbalta, flexeril, gabapentin  Relief with NSAIDs?: no Nighttime pain:  yes Paresthesias / decreased sensation:  no Bowel / bladder incontinence:  no Fevers:  no Dysuria / urinary frequency:  no  Past Medical History:  Diagnosis Date   Chronic lower back pain    Hydradenitis    Seizures ([facility])     Past Surgical History:  Procedure Laterality Date   DILATION AND CURETTAGE OF UTERUS     3 miscarriages   HYDRADENITIS EXCISION     R underarm   TUBAL LIGATION     TUMOR REMOVAL  07/2020   index finger on L hand    Family History  Problem Relation Age of Onset   Seizures Mother    Diabetes Mother    Heart attack Father    Hypertension Brother    Eczema Daughter    Anxiety disorder Maternal Grandmother    Hypertension Brother    Hypertension Brother     Hypertension Brother    Hypertension Brother     Social History   Socioeconomic History   Marital status: Single    Spouse name: Not on file   Number of children: Not on file   Years of education: Not on file   Highest education level: Not on file  Occupational History   Not on file  Tobacco Use   Smoking status: Every Day    Packs/day: 0.50    Types: Cigarettes   Smokeless tobacco: Never  Vaping Use   Vaping Use: Never used  Substance and Sexual Activity   Alcohol use: No   Drug use: No   Sexual activity: Not Currently  Other Topics Concern   Not on file  Social History Narrative   Not on file   Social Determinants of Health   Financial Resource Strain: Not on file  Food Insecurity: Not on file  Transportation Needs: Not on file  Physical Activity: Not on file  Stress: Not on file  Social Connections: Not on file  Intimate Partner Violence: Not on file    Outpatient Medications Prior to Visit  Medication Sig Dispense Refill   clotrimazole (LOTRIMIN) 1 % cream Apply 1 application topically 2 (two) times daily. 30 g 0   cyclobenzaprine (FLEXERIL) 10 MG tablet Take 1 tablet (10 mg total) by mouth 3 (three) times daily as needed for muscle spasms.  30 tablet 0   fluticasone (FLONASE) 50 MCG/ACT nasal spray Place 2 sprays into both nostrils daily. 16 g 6   phenytoin (DILANTIN) 100 MG ER capsule TAKE 1 CAPSULE(100 MG) BY MOUTH THREE TIMES DAILY 90 capsule 2   DULoxetine (CYMBALTA) 30 MG capsule TAKE 1 CAPSULE(30 MG) BY MOUTH DAILY (Patient not taking: No sig reported) 90 capsule 0   ondansetron (ZOFRAN) 8 MG tablet Take 1 tablet (8 mg total) by mouth every 8 (eight) hours as needed for nausea or vomiting. (Patient not taking: No sig reported) 20 tablet 0   No facility-administered medications prior to visit.    Allergies  Allergen Reactions   Citrus Hives   Sulfamethoxazole-Trimethoprim Hives   Butorphanol Hives   Butorphanol Tartrate Hives   Contrast Media  [Iodinated Diagnostic Agents] Nausea And Vomiting   Food Other (See Comments)    Allergic to Citrus Fruits.   Ibuprofen    Ketorolac    Lorazepam Hives   Naproxen    Prochlorperazine Hives   Nsaids Rash    Review of Systems  Constitutional: Negative.   Respiratory: Negative.    Cardiovascular: Negative.   Gastrointestinal: Negative.   Genitourinary: Negative.   Musculoskeletal:  Positive for back pain.  Skin:        2 abscesses per HPI      Objective:    Physical Exam Vitals and nursing note reviewed. Exam conducted with a chaperone present.  Constitutional:      General: She is not in acute distress.    Appearance: Normal appearance.  HENT:     Head: Normocephalic.  Eyes:     Conjunctiva/sclera: Conjunctivae normal.  Cardiovascular:     Rate and Rhythm: Normal rate and regular rhythm.     Pulses: Normal pulses.     Heart sounds: Normal heart sounds.  Pulmonary:     Effort: Pulmonary effort is normal.     Breath sounds: Normal breath sounds.  Musculoskeletal:     Cervical back: Normal range of motion.  Skin:    General: Skin is warm.     Comments: Small, raised boil to right inner thigh that appears to have started draining Small, red flat area to right groin that has drained   Neurological:     General: No focal deficit present.     Mental Status: She is alert and oriented to person, place, and time.  Psychiatric:        Mood and Affect: Mood normal.        Behavior: Behavior normal.        Thought Content: Thought content normal.        Judgment: Judgment normal.    BP 100/70   Pulse (!) 108   Temp 98.4 F (36.9 C) (Oral)   Ht _0  (1.651 m)   Wt 169 lb (76.7 kg)   SpO2 98%   BMI 28.12 kg/m  Wt Readings from Last 3 Encounters:  05/10/21 169 lb (76.7 kg)  04/27/21 169 lb (76.7 kg)  04/01/21 171 lb 6.4 oz (77.7 kg)    Health Maintenance Due  Topic Date Due   Pneumococcal Vaccine 60-15 Years old (1 - PCV) Never done    There are no  preventive care reminders to display for this patient.   Lab Results  Component Value Date   TSH 1.790 12/31/2020   Lab Results  Component Value Date   WBC 9.4 12/09/2020   HGB 12.4 12/09/2020   HCT 37.0 12/09/2020  MCV 82.4 12/09/2020   PLT 326 12/09/2020   Lab Results  Component Value Date   NA 134 (L) 12/09/2020   K 3.9 12/09/2020   CO2 22 12/09/2020   GLUCOSE 166 (H) 12/09/2020   BUN 16 12/09/2020   CREATININE 0.65 12/09/2020   BILITOT 0.5 12/09/2020   ALKPHOS 68 12/09/2020   AST 18 12/09/2020   ALT 15 12/09/2020   PROT 7.8 12/09/2020   ALBUMIN 3.8 12/09/2020   CALCIUM 9.2 12/09/2020   ANIONGAP 9 12/09/2020   EGFR 94 12/01/2020   Lab Results  Component Value Date   CHOL 249 (H) 12/31/2020   Lab Results  Component Value Date   HDL 49 12/31/2020   Lab Results  Component Value Date   LDLCALC 158 (H) 12/31/2020   Lab Results  Component Value Date   TRIG 228 (H) 12/31/2020   Lab Results  Component Value Date   CHOLHDL 4.7 03/12/2016   Lab Results  Component Value Date   HGBA1C 5.4 12/31/2020       Assessment & Plan:   Problem List Items Addressed This Visit       Nervous and Auditory   Chronic midline low back pain with bilateral sciatica - Primary    Chronic, ongoing. Flexeril did not help much with back pain. She has tried cymbalta, tylenol, lidocaine patches, and gabapentin in the past. Will try lyrica 11m daily. Reminded her to complete exercises and stretches for back pain. Will refer to neurosurgery if still having pain. Follow up in 1 month.      Relevant Medications   pregabalin (LYRICA) 75 MG capsule   Other Visit Diagnoses     Boil       Both have drained some, 1 to inner thigh that still has some fluid, not fluctuant. Will treat with doxycycline and warm compresses.         Meds ordered this encounter  Medications   pregabalin (LYRICA) 75 MG capsule    Sig: Take 1 capsule (75 mg total) by mouth 2 (two) times daily.     Dispense:  30 capsule    Refill:  1   doxycycline (VIBRA-TABS) 100 MG tablet    Sig: Take 1 tablet (100 mg total) by mouth 2 (two) times daily.    Dispense:  20 tablet    Refill:  0     LCharyl Dancer NP

## 2021-05-10 NOTE — Assessment & Plan Note (Signed)
Chronic, ongoing. Flexeril did not help much with back pain. She has tried cymbalta, tylenol, lidocaine patches, and gabapentin in the past. Will try lyrica 75mg  daily. Reminded her to complete exercises and stretches for back pain. Will refer to neurosurgery if still having pain. Follow up in 1 month.

## 2021-05-14 ENCOUNTER — Ambulatory Visit: Payer: Medicaid Other | Admitting: Nurse Practitioner

## 2021-05-25 ENCOUNTER — Ambulatory Visit: Payer: Self-pay | Admitting: *Deleted

## 2021-05-25 NOTE — Telephone Encounter (Signed)
Summary: Back Pain   Pt called and stated that she would like a call back from the nurse regarding back pain. She states that she stop talking"back medicine because of heart problems. Pt states that she is having a lot of back spasms.     Patient states she stopped her back pain medication - cyclobenzaprine due to heart palpitations. Patient is requesting appointment to discuss other options. Appointment scheduled  Reason for Disposition  [1] SEVERE back pain (e.g., excruciating, unable to do any normal activities) AND [2] not improved 2 hours after pain medicine  Answer Assessment - Initial Assessment Questions 1. ONSET: "When did the pain begin?"      Chronic back pain- degenerative changes 2. LOCATION: "Where does it hurt?" (upper, mid or lower back)     Lower back 3. SEVERITY: "How bad is the pain?"  (e.g., Scale 1-10; mild, moderate, or severe)   - MILD (1-3): doesn't interfere with normal activities    - MODERATE (4-7): interferes with normal activities or awakens from sleep    - SEVERE (8-10): excruciating pain, unable to do any normal activities      severe 4. PATTERN: "Is the pain constant?" (e.g., yes, no; constant, intermittent)      Constant- patient has back spasms 5. RADIATION: "Does the pain shoot into your legs or elsewhere?"     No radiation 6. CAUSE:  "What do you think is causing the back pain?"      Degenerative changes 7. BACK OVERUSE:  "Any recent lifting of heavy objects, strenuous work or exercise?"     no 8. MEDICATIONS: "What have you taken so far for the pain?" (e.g., nothing, acetaminophen, NSAIDS)     no 9. NEUROLOGIC SYMPTOMS: "Do you have any weakness, numbness, or problems with bowel/bladder control?"     no 10. OTHER SYMPTOMS: "Do you have any other symptoms?" (e.g., fever, abdominal pain, burning with urination, blood in urine)       no 11. PREGNANCY: "Is there any chance you are pregnant?" (e.g., yes, no; LMP)       N/a  Protocols used: Back  Pain-A-AH

## 2021-05-27 NOTE — Progress Notes (Signed)
Established Patient Office Visit  Subjective:  Patient ID: Karina Anderson, female    DOB: 04/17/1971  Age: 50 y.o. MRN: [id number]  CC:  Chief Complaint  Patient presents with   Back Pain   Abscess    HPI [name] presents for ongoing chronic back pain. Last MRI was on 04/27/11 and showed L5-S1 central disc bulge pressing against S1 nerve roots. States that she has had an MRI since, although doesn't have access to the results. She had tried cymbalta which helped for a few months and then stopped working. She had tried gabapentin and lyrica with side effects and not able to tolerate.   She also endorses an abscess in her groin. She has a history of hidradenitis suppurativa and last abscess a few weeks ago. She was given doxycycline which resolved that abscess, but now has a new one that has appeared. She denies fevers.    Past Medical History:  Diagnosis Date   Chronic lower back pain    Hydradenitis    Seizures ([place])     Past Surgical History:  Procedure Laterality Date   DILATION AND CURETTAGE OF UTERUS     3 miscarriages   HYDRADENITIS EXCISION     R underarm   TUBAL LIGATION     TUMOR REMOVAL  07/2020   index finger on L hand    Family History  Problem Relation Age of Onset   Seizures Mother    Diabetes Mother    Heart attack Father    Hypertension Brother    Eczema Daughter    Anxiety disorder Maternal Grandmother    Hypertension Brother    Hypertension Brother    Hypertension Brother    Hypertension Brother     Social History   Socioeconomic History   Marital status: Single    Spouse name: Not on file   Number of children: Not on file   Years of education: Not on file   Highest education level: Not on file  Occupational History   Not on file  Tobacco Use   Smoking status: Every Day    Packs/day: 0.50    Types: Cigarettes   Smokeless tobacco: Never  Vaping Use   Vaping Use: Never used  Substance and Sexual Activity   Alcohol use: No   Drug  use: No   Sexual activity: Not Currently  Other Topics Concern   Not on file  Social History Narrative   Not on file   Social Determinants of Health   Financial Resource Strain: Not on file  Food Insecurity: Not on file  Transportation Needs: Not on file  Physical Activity: Not on file  Stress: Not on file  Social Connections: Not on file  Intimate Partner Violence: Not on file    Outpatient Medications Prior to Visit  Medication Sig Dispense Refill   clotrimazole (LOTRIMIN) 1 % cream Apply 1 application topically 2 (two) times daily. 30 g 0   cyclobenzaprine (FLEXERIL) 10 MG tablet Take 1 tablet (10 mg total) by mouth 3 (three) times daily as needed for muscle spasms. 30 tablet 0   fluticasone (FLONASE) 50 MCG/ACT nasal spray Place 2 sprays into both nostrils daily. 16 g 6   omeprazole (PRILOSEC OTC) 20 MG tablet Take 20 mg by mouth daily. 28 tablet 1   phenytoin (DILANTIN) 100 MG ER capsule TAKE 1 CAPSULE(100 MG) BY MOUTH THREE TIMES DAILY 90 capsule 2   pregabalin (LYRICA) 75 MG capsule Take 1 capsule (75 mg total) by  mouth 2 (two) times daily. 30 capsule 1   doxycycline (VIBRA-TABS) 100 MG tablet Take 1 tablet (100 mg total) by mouth 2 (two) times daily. 20 tablet 0   No facility-administered medications prior to visit.    Allergies  Allergen Reactions   Citrus Hives   Sulfamethoxazole-Trimethoprim Hives   Butorphanol Hives   Butorphanol Tartrate Hives   Contrast Media [Iodinated Diagnostic Agents] Nausea And Vomiting   Food Other (See Comments)    Allergic to Citrus Fruits.   Ibuprofen    Ketorolac    Lorazepam Hives   Naproxen    Prochlorperazine Hives   Nsaids Rash    ROS Review of Systems  Constitutional: Negative.   Respiratory: Negative.    Cardiovascular: Negative.   Gastrointestinal: Negative.   Genitourinary: Negative.   Musculoskeletal:  Positive for back pain.  Skin:  Positive for wound (abscess to groin).  Neurological: Negative.       Objective:    Physical Exam Vitals and nursing note reviewed.  Constitutional:      General: She is not in acute distress.    Appearance: Normal appearance.  HENT:     Head: Normocephalic and atraumatic.  Eyes:     Conjunctiva/sclera: Conjunctivae normal.  Cardiovascular:     Rate and Rhythm: Normal rate.     Pulses: Normal pulses.  Pulmonary:     Effort: Pulmonary effort is normal.  Musculoskeletal:     Cervical back: Normal range of motion.  Skin:    General: Skin is warm and dry.     Comments: Small 0.5cmx 0.5cm raised painful abscess to left groin. No drainage.   Neurological:     General: No focal deficit present.     Mental Status: She is alert and oriented to person, place, and time.  Psychiatric:        Mood and Affect: Mood normal.        Behavior: Behavior normal.        Thought Content: Thought content normal.        Judgment: Judgment normal.    BP (!) 140/109   Pulse (!) 108   Ht 5' 4"  (1.626 m)   Wt 172 lb (78 kg)   BMI 29.52 kg/m  Wt Readings from Last 3 Encounters:  05/28/21 172 lb (78 kg)  05/10/21 169 lb (76.7 kg)  04/27/21 169 lb (76.7 kg)     Health Maintenance Due  Topic Date Due   COVID-19 Vaccine (1) Never done    There are no preventive care reminders to display for this patient.  Lab Results  Component Value Date   TSH 1.790 12/31/2020   Lab Results  Component Value Date   WBC 9.4 12/09/2020   HGB 12.4 12/09/2020   HCT 37.0 12/09/2020   MCV 82.4 12/09/2020   PLT 326 12/09/2020   Lab Results  Component Value Date   NA 134 (L) 12/09/2020   K 3.9 12/09/2020   CO2 22 12/09/2020   GLUCOSE 166 (H) 12/09/2020   BUN 16 12/09/2020   CREATININE 0.65 12/09/2020   BILITOT 0.5 12/09/2020   ALKPHOS 68 12/09/2020   AST 18 12/09/2020   ALT 15 12/09/2020   PROT 7.8 12/09/2020   ALBUMIN 3.8 12/09/2020   CALCIUM 9.2 12/09/2020   ANIONGAP 9 12/09/2020   EGFR 94 12/01/2020   Lab Results  Component Value Date   CHOL 249 (H)  12/31/2020   Lab Results  Component Value Date   HDL 49 12/31/2020  Lab Results  Component Value Date   LDLCALC 158 (H) 12/31/2020   Lab Results  Component Value Date   TRIG 228 (H) 12/31/2020   Lab Results  Component Value Date   CHOLHDL 4.7 03/12/2016   Lab Results  Component Value Date   HGBA1C 5.4 12/31/2020      Assessment & Plan:   Problem List Items Addressed This Visit       Nervous and Auditory   Chronic midline low back pain with bilateral sciatica - Primary    Chronic, ongoing. Has tried cymbalta, gabapentin, and lyrica. Cymbalta did help for a few months, gabapentin and lyrica didn't help and gave her side effects. Will try pristiq daily. Discussed potential side effects. Will also place referral to neurosurgery to see if there are any options to help her pain. Last MRI I have access to showed L5-S1 disc bulge pressing on S1 nerves. She does not want to go to pain management. This has been offered at several past visits as well.       Relevant Medications   desvenlafaxine (PRISTIQ) 100 MG 24 hr tablet   Other Relevant Orders   Ambulatory referral to Neurosurgery   Other Visit Diagnoses     Boil       new boil to left groin. Small, not enough to drain. With recently on doxycyline, will treat with clindamycin. use warm compresses. F/U if symptoms worsen   Relevant Medications   clindamycin (CLEOCIN) 300 MG capsule       Meds ordered this encounter  Medications   clindamycin (CLEOCIN) 300 MG capsule    Sig: Take 1 capsule (300 mg total) by mouth 4 (four) times daily.    Dispense:  28 capsule    Refill:  0   desvenlafaxine (PRISTIQ) 100 MG 24 hr tablet    Sig: Take 1 tablet (100 mg total) by mouth daily.    Dispense:  30 tablet    Refill:  0     Follow-up: Return if symptoms worsen or fail to improve.    Charyl Dancer, NP

## 2021-05-28 ENCOUNTER — Other Ambulatory Visit: Payer: Self-pay

## 2021-05-28 ENCOUNTER — Ambulatory Visit (INDEPENDENT_AMBULATORY_CARE_PROVIDER_SITE_OTHER): Payer: Medicaid Other | Admitting: Nurse Practitioner

## 2021-05-28 ENCOUNTER — Encounter: Payer: Self-pay | Admitting: Nurse Practitioner

## 2021-05-28 VITALS — BP 140/109 | HR 108 | Ht 64.0 in | Wt 172.0 lb

## 2021-05-28 DIAGNOSIS — M5442 Lumbago with sciatica, left side: Secondary | ICD-10-CM | POA: Diagnosis not present

## 2021-05-28 DIAGNOSIS — L0292 Furuncle, unspecified: Secondary | ICD-10-CM

## 2021-05-28 DIAGNOSIS — M5441 Lumbago with sciatica, right side: Secondary | ICD-10-CM | POA: Diagnosis not present

## 2021-05-28 DIAGNOSIS — G8929 Other chronic pain: Secondary | ICD-10-CM | POA: Diagnosis not present

## 2021-05-28 MED ORDER — CLINDAMYCIN HCL 300 MG PO CAPS: 300.0000 mg | ORAL_CAPSULE | Freq: Four times a day (QID) | ORAL | 0 refills | Status: DC

## 2021-05-28 MED ORDER — DESVENLAFAXINE SUCCINATE ER 100 MG PO TB24: 100.0000 mg | ORAL_TABLET | Freq: Every day | ORAL | 0 refills | Status: DC

## 2021-05-28 NOTE — Assessment & Plan Note (Signed)
Chronic, ongoing. Has tried cymbalta, gabapentin, and lyrica. Cymbalta did help for a few months, gabapentin and lyrica didn't help and gave her side effects. Will try pristiq daily. Discussed potential side effects. Will also place referral to neurosurgery to see if there are any options to help her pain. Last MRI I have access to showed L5-S1 disc bulge pressing on S1 nerves. She does not want to go to pain management. This has been offered at several past visits as well.

## 2021-06-01 ENCOUNTER — Ambulatory Visit: Payer: Self-pay

## 2021-06-01 NOTE — Telephone Encounter (Signed)
Pt. Reports she started Pristiq and it has caused abdominal pain and constipation. "I can't take that medicine." Requests "something for my back pain " be sent to her pharmacy. Please advise pt.     Answer Assessment - Initial Assessment Questions 1. NAME of MEDICATION: "What medicine are you calling about?"     Pristiq 2. QUESTION: "What is your question?" (e.g., double dose of medicine, side effect)     Side effect 3. PRESCRIBING HCP: "Who prescribed it?" Reason: if prescribed by specialist, call should be referred to that group.     [place] 4. SYMPTOMS: "Do you have any symptoms?"     Abdominal pain, constipation 5. SEVERITY: If symptoms are present, ask "Are they mild, moderate or severe?"     Severe 6. PREGNANCY:  "Is there any chance that you are pregnant?" "When was your last menstrual period?"     No  Protocols used: Medication Question Call-A-AH

## 2021-06-07 ENCOUNTER — Ambulatory Visit (INDEPENDENT_AMBULATORY_CARE_PROVIDER_SITE_OTHER): Payer: Medicaid Other | Admitting: Nurse Practitioner

## 2021-06-07 ENCOUNTER — Ambulatory Visit: Payer: Self-pay | Admitting: *Deleted

## 2021-06-07 ENCOUNTER — Other Ambulatory Visit: Payer: Self-pay

## 2021-06-07 ENCOUNTER — Encounter: Payer: Self-pay | Admitting: Nurse Practitioner

## 2021-06-07 VITALS — BP 124/89 | HR 105 | Ht 64.0 in | Wt 172.0 lb

## 2021-06-07 DIAGNOSIS — H60392 Other infective otitis externa, left ear: Secondary | ICD-10-CM | POA: Diagnosis not present

## 2021-06-07 MED ORDER — AMOXICILLIN-POT CLAVULANATE 875-125 MG PO TABS: 1.0000 | ORAL_TABLET | Freq: Two times a day (BID) | ORAL | 0 refills | Status: DC

## 2021-06-07 NOTE — Telephone Encounter (Signed)
Call to patient- advised needs appointment- call to office- scheduled today- ear pain, sore throat

## 2021-06-07 NOTE — Progress Notes (Signed)
BP 124/89   Pulse (!) 105   Ht 5\' 4"  (1.626 m)   Wt 172 lb (78 kg)   BMI 29.52 kg/m    Subjective:    Patient ID: , female    DOB: Dec 12, 1970, 50 y.o.   MRN: [id number]  HPI: Karina Anderson is a 50 y.o. female  54 Complaint  Patient presents with   Ear Pain   EAR PAIN Duration:  5 days Involved ear(s): left Severity:  10/10  Quality:  sharp Fever: no Otorrhea: no Upper respiratory infection symptoms: yes- congestion and sore throat Pruritus: yes Hearing loss: yes Water immersion no Using Q-tips: yes Recurrent otitis media: no Status: worse Treatments attempted: none   Relevant past medical, surgical, family and social history reviewed and updated as indicated. Interim medical history since our last visit reviewed. Allergies and medications reviewed and updated.  Review of Systems  Constitutional:  Negative for fever.  HENT:  Positive for congestion, ear pain, hearing loss, postnasal drip and sore throat.   Respiratory:  Positive for cough.    Per HPI unless specifically indicated above     Objective:    BP 124/89   Pulse (!) 105   Ht 5\' 4"  (1.626 m)   Wt 172 lb (78 kg)   BMI 29.52 kg/m   Wt Readings from Last 3 Encounters:  06/07/21 172 lb (78 kg)  05/28/21 172 lb (78 kg)  05/10/21 169 lb (76.7 kg)    Physical Exam Vitals and nursing note reviewed.  Constitutional:      General: She is not in acute distress.    Appearance: Normal appearance. She is normal weight. She is not ill-appearing, toxic-appearing or diaphoretic.  HENT:     Head: Normocephalic.     Right Ear: External ear normal. No tenderness. A middle ear effusion is present. Tympanic membrane is not erythematous.     Left Ear: External ear normal. Tenderness present. A middle ear effusion is present. Tympanic membrane is erythematous.     Nose: Congestion present.     Mouth/Throat:     Mouth: Mucous membranes are moist.     Pharynx: Oropharynx is clear. Posterior  oropharyngeal erythema present. No oropharyngeal exudate.  Eyes:     General:        Right eye: No discharge.        Left eye: No discharge.     Extraocular Movements: Extraocular movements intact.     Conjunctiva/sclera: Conjunctivae normal.     Pupils: Pupils are equal, round, and reactive to light.  Cardiovascular:     Rate and Rhythm: Normal rate and regular rhythm.     Heart sounds: No murmur heard. Pulmonary:     Effort: Pulmonary effort is normal. No respiratory distress.     Breath sounds: Normal breath sounds. No wheezing or rales.  Musculoskeletal:     Cervical back: Normal range of motion and neck supple.  Skin:    General: Skin is warm and dry.     Capillary Refill: Capillary refill takes less than 2 seconds.  Neurological:     General: No focal deficit present.     Mental Status: She is alert and oriented to person, place, and time. Mental status is at baseline.  Psychiatric:        Mood and Affect: Mood normal.        Behavior: Behavior normal.        Thought Content: Thought content normal.  Judgment: Judgment normal.    Results for orders placed or performed in visit on 04/01/21  WET PREP FOR TRICH, YEAST, CLUE   Specimen: Sterile Swab   Sterile Swab  Result Value Ref Range   Trichomonas Exam Negative Negative   Yeast Exam Negative Negative   Clue Cell Exam Negative Negative  Chlamydia/GC NAA, Confirmation   Specimen: Urine   UR  Result Value Ref Range   Chlamydia trachomatis, NAA Negative Negative   Neisseria gonorrhoeae, NAA Negative Negative  Urinalysis, Routine w reflex microscopic  Result Value Ref Range   Specific Gravity, UA 1.025 1.005 - 1.030   pH, UA 5.5 5.0 - 7.5   Color, UA Yellow Yellow   Appearance Ur Cloudy (A) Clear   Leukocytes,UA Negative Negative   Protein,UA Trace (A) Negative/Trace   Glucose, UA Negative Negative   Ketones, UA Negative Negative   RBC, UA Negative Negative   Bilirubin, UA Negative Negative    Urobilinogen, Ur 0.2 0.2 - 1.0 mg/dL   Nitrite, UA Negative Negative      Assessment & Plan:   Problem List Items Addressed This Visit   None Visit Diagnoses     Other infective acute otitis externa of left ear    -  Primary   Augmentin sent to the pharmacy for patient to complete. Return to clinic if symptoms worsen or fail to improve.         Follow up plan: Return if symptoms worsen or fail to improve.

## 2021-06-07 NOTE — Telephone Encounter (Signed)
Pt called stating that she has been experiencing a sore throat, cough, and pain in ears x5 days.  Pt declined appt and is requesting to have a nurse give her a call back with care advice. Please advise.  Reason for Disposition  [1] SEVERE pain AND [2] not improved 2 hours after taking analgesic medication (e.g., ibuprofen or acetaminophen)  Answer Assessment - Initial Assessment Questions 1. LOCATION: "Which ear is involved?"     Left ear 2. ONSET: "When did the ear start hurting"      5 days 3. SEVERITY: "How bad is the pain?"  (Scale 1-10; mild, moderate or severe)   - MILD (1-3): doesn't interfere with normal activities    - MODERATE (4-7): interferes with normal activities or awakens from sleep    - SEVERE (8-10): excruciating pain, unable to do any normal activities      severe 4. URI SYMPTOMS: "Do you have a runny nose or cough?"     Cough, sore throat 5. FEVER: "Do you have a fever?" If Yes, ask: "What is your temperature, how was it measured, and when did it start?"     no 6. CAUSE: "Have you been swimming recently?", "How often do you use Q-TIPS?", "Have you had any recent air travel or scuba diving?"     no 7. OTHER SYMPTOMS: "Do you have any other symptoms?" (e.g., headache, stiff neck, dizziness, vomiting, runny nose, decreased hearing)     Sore throat 8. PREGNANCY: "Is there any chance you are pregnant?" "When was your last menstrual period?"     No- LMP- due now  Protocols used: Earache-A-AH

## 2021-06-10 ENCOUNTER — Encounter: Payer: Self-pay | Admitting: Nurse Practitioner

## 2021-06-10 ENCOUNTER — Other Ambulatory Visit: Payer: Self-pay

## 2021-06-10 ENCOUNTER — Ambulatory Visit (INDEPENDENT_AMBULATORY_CARE_PROVIDER_SITE_OTHER): Payer: Medicaid Other | Admitting: Nurse Practitioner

## 2021-06-10 VITALS — BP 109/78 | HR 114 | Ht 64.0 in | Wt 169.0 lb

## 2021-06-10 DIAGNOSIS — R2241 Localized swelling, mass and lump, right lower limb: Secondary | ICD-10-CM

## 2021-06-10 NOTE — Progress Notes (Signed)
Established Patient Office Visit  Subjective:  Patient ID: Karina Anderson, female    DOB: 1971-07-19  Age: 50 y.o. MRN: [id number]  CC:  Chief Complaint  Patient presents with   Mass    Right groin, painful     HPI [place] presents for a "boil" on her right groin. She states that it has gotten bigger and is painful. She has been putting warm compresses on it. She has been on doxycycline and clindamycin for other smaller boils. She denies fevers, nausea, vomiting, and urinary symptoms.   Past Medical History:  Diagnosis Date   Chronic lower back pain    Hydradenitis    Seizures ([place])     Past Surgical History:  Procedure Laterality Date   DILATION AND CURETTAGE OF UTERUS     3 miscarriages   HYDRADENITIS EXCISION     R underarm   TUBAL LIGATION     TUMOR REMOVAL  07/2020   index finger on L hand    Family History  Problem Relation Age of Onset   Seizures Mother    Diabetes Mother    Heart attack Father    Hypertension Brother    Eczema Daughter    Anxiety disorder Maternal Grandmother    Hypertension Brother    Hypertension Brother    Hypertension Brother    Hypertension Brother     Social History   Socioeconomic History   Marital status: Single    Spouse name: Not on file   Number of children: Not on file   Years of education: Not on file   Highest education level: Not on file  Occupational History   Not on file  Tobacco Use   Smoking status: Every Day    Packs/day: 0.50    Types: Cigarettes   Smokeless tobacco: Never  Vaping Use   Vaping Use: Never used  Substance and Sexual Activity   Alcohol use: No   Drug use: No   Sexual activity: Not Currently  Other Topics Concern   Not on file  Social History Narrative   Not on file   Social Determinants of Health   Financial Resource Strain: Not on file  Food Insecurity: Not on file  Transportation Needs: Not on file  Physical Activity: Not on file  Stress: Not on file  Social  Connections: Not on file  Intimate Partner Violence: Not on file    Outpatient Medications Prior to Visit  Medication Sig Dispense Refill   amoxicillin-clavulanate (AUGMENTIN) 875-125 MG tablet Take 1 tablet by mouth 2 (two) times daily. 20 tablet 0   clotrimazole (LOTRIMIN) 1 % cream Apply 1 application topically 2 (two) times daily. 30 g 0   cyclobenzaprine (FLEXERIL) 10 MG tablet Take 1 tablet (10 mg total) by mouth 3 (three) times daily as needed for muscle spasms. 30 tablet 0   fluticasone (FLONASE) 50 MCG/ACT nasal spray Place 2 sprays into both nostrils daily. 16 g 6   omeprazole (PRILOSEC OTC) 20 MG tablet Take 20 mg by mouth daily. 28 tablet 1   phenytoin (DILANTIN) 100 MG ER capsule TAKE 1 CAPSULE(100 MG) BY MOUTH THREE TIMES DAILY 90 capsule 2   desvenlafaxine (PRISTIQ) 100 MG 24 hr tablet Take 1 tablet (100 mg total) by mouth daily. (Patient not taking: Reported on 06/10/2021) 30 tablet 0   pregabalin (LYRICA) 75 MG capsule Take 1 capsule (75 mg total) by mouth 2 (two) times daily. (Patient not taking: Reported on 06/10/2021) 30 capsule 1  No facility-administered medications prior to visit.    Allergies  Allergen Reactions   Citrus Hives   Sulfamethoxazole-Trimethoprim Hives   Butorphanol Hives   Butorphanol Tartrate Hives   Contrast Media [Iodinated Diagnostic Agents] Nausea And Vomiting   Food Other (See Comments)    Allergic to Citrus Fruits.   Ibuprofen    Ketorolac    Lorazepam Hives   Naproxen    Prochlorperazine Hives   Nsaids Rash    ROS Review of Systems  Constitutional:  Positive for fatigue.  Respiratory: Negative.    Cardiovascular: Negative.   Skin:        Lump on right groin     Objective:    Physical Exam Vitals and nursing note reviewed.  Constitutional:      General: She is not in acute distress.    Appearance: Normal appearance.  HENT:     Head: Normocephalic and atraumatic.  Eyes:     Conjunctiva/sclera: Conjunctivae normal.   Cardiovascular:     Rate and Rhythm: Normal rate.     Pulses: Normal pulses.  Pulmonary:     Effort: Pulmonary effort is normal.  Musculoskeletal:     Cervical back: Normal range of motion.  Skin:    General: Skin is warm and dry.     Comments: Small 1cm x 1cm nodule in right groin. No fluctuant areas. Tenderness with palpation. No redness or warmth to site.   Neurological:     General: No focal deficit present.     Mental Status: She is alert and oriented to person, place, and time.  Psychiatric:        Mood and Affect: Mood normal.        Behavior: Behavior normal.        Thought Content: Thought content normal.        Judgment: Judgment normal.    BP 109/78   Pulse (!) 114   Ht 5' 4"  (1.626 m)   Wt 169 lb (76.7 kg)   BMI 29.01 kg/m  Wt Readings from Last 3 Encounters:  06/10/21 169 lb (76.7 kg)  06/07/21 172 lb (78 kg)  05/28/21 172 lb (78 kg)     Health Maintenance Due  Topic Date Due   COVID-19 Vaccine (1) Never done    There are no preventive care reminders to display for this patient.  Lab Results  Component Value Date   TSH 1.790 12/31/2020   Lab Results  Component Value Date   WBC 9.4 12/09/2020   HGB 12.4 12/09/2020   HCT 37.0 12/09/2020   MCV 82.4 12/09/2020   PLT 326 12/09/2020   Lab Results  Component Value Date   NA 134 (L) 12/09/2020   K 3.9 12/09/2020   CO2 22 12/09/2020   GLUCOSE 166 (H) 12/09/2020   BUN 16 12/09/2020   CREATININE 0.65 12/09/2020   BILITOT 0.5 12/09/2020   ALKPHOS 68 12/09/2020   AST 18 12/09/2020   ALT 15 12/09/2020   PROT 7.8 12/09/2020   ALBUMIN 3.8 12/09/2020   CALCIUM 9.2 12/09/2020   ANIONGAP 9 12/09/2020   EGFR 94 12/01/2020   Lab Results  Component Value Date   CHOL 249 (H) 12/31/2020   Lab Results  Component Value Date   HDL 49 12/31/2020   Lab Results  Component Value Date   LDLCALC 158 (H) 12/31/2020   Lab Results  Component Value Date   TRIG 228 (H) 12/31/2020   Lab Results   Component Value Date  CHOLHDL 4.7 03/12/2016   Lab Results  Component Value Date   HGBA1C 5.4 12/31/2020      Assessment & Plan:   Problem List Items Addressed This Visit   None Visit Diagnoses     Skin lump of leg, right    -  Primary   Cyst vs. lymph node. Ultrasound ordered. If cyst can set up appointment for removal in office. Continue warm compresses, tylenol prn.    Relevant Orders   [place] RT LOWER EXTREM LTD SOFT TISSUE NON VASCULAR       No orders of the defined types were placed in this encounter.   Follow-up: Return if symptoms worsen or fail to improve, for after ultrasound results.    Charyl Dancer, NP

## 2021-06-18 ENCOUNTER — Ambulatory Visit: Payer: Self-pay

## 2021-06-18 ENCOUNTER — Ambulatory Visit: Payer: Medicaid Other | Admitting: Nurse Practitioner

## 2021-06-18 NOTE — Telephone Encounter (Signed)
Pt. Called back appointment made for today.

## 2021-06-18 NOTE — Telephone Encounter (Signed)
Pt. Complaining of painful boil to right thigh. States she was seen recently, "but it is getting worse." Requests appointment. Call dropped. Attempted to reach pt. No answer.     Reason for Disposition  2 or more boils  Answer Assessment - Initial Assessment Questions 1. APPEARANCE of BOIL: "What does the boil look like?"      Finger size 2. LOCATION: "Where is the boil located?"      Right thigh 3. NUMBER: "How many boils are there?"      1 4. SIZE: "How big is the boil?" (e.g., inches, cm; compare to size of a coin or other object)     Finger size 5. ONSET: "When did the boil start?"     Last week 6. PAIN: "Is there any pain?" If Yes, ask: "How bad is the pain?"   (Scale 1-10; or mild, moderate, severe)     11 7. FEVER: "Do you have a fever?" If Yes, ask: "What is it, how was it measured, and when did it start?"      No 8. SOURCE: "Have you been around anyone with boils or other Staph infections?" "Have you ever had boils before?"     No 9. OTHER SYMPTOMS: "Do you have any other symptoms?" (e.g., shaking chills, weakness, rash elsewhere on body)     Pain 10. PREGNANCY: "Is there any chance you are pregnant?" "When was your last menstrual period?"       No  Protocols used: Boil (Skin Abscess)-A-AH

## 2021-06-18 NOTE — Telephone Encounter (Signed)
Pt. Called back and had [facility] agent reschedule her appointment for Monday. Requesting "something for pain be sent to my drug store." Please advise.

## 2021-06-21 ENCOUNTER — Ambulatory Visit (INDEPENDENT_AMBULATORY_CARE_PROVIDER_SITE_OTHER): Payer: Medicaid Other | Admitting: Nurse Practitioner

## 2021-06-21 ENCOUNTER — Other Ambulatory Visit: Payer: Self-pay

## 2021-06-21 ENCOUNTER — Encounter: Payer: Self-pay | Admitting: Nurse Practitioner

## 2021-06-21 VITALS — BP 122/96 | HR 112 | Temp 98.6°F | Wt 170.2 lb

## 2021-06-21 DIAGNOSIS — Z748 Other problems related to care provider dependency: Secondary | ICD-10-CM | POA: Diagnosis not present

## 2021-06-21 DIAGNOSIS — L739 Follicular disorder, unspecified: Secondary | ICD-10-CM

## 2021-06-21 MED ORDER — DOXYCYCLINE HYCLATE 100 MG PO TABS: 100.0000 mg | ORAL_TABLET | Freq: Two times a day (BID) | ORAL | 0 refills | Status: DC

## 2021-06-21 NOTE — Telephone Encounter (Signed)
Patient was seen in office today.  

## 2021-06-21 NOTE — Progress Notes (Signed)
Acute Office Visit  Subjective:    Patient ID: Karina Anderson, female    DOB: 1971/07/18, 50 y.o.   MRN: [id number]  Chief Complaint  Patient presents with   Abscess    Patient states she has 2 boil like abscesses between her thighs. Patient states one did drain and it came right back. Patient states it usually comes in that area around the time for her cycle. Patient states thinks they may have to cut it out as she has had done in the past to her arm. Patient states this last flare up started this past weekend.     HPI Patient is in today for 2 boils in between her thighs that started on Saturday.   SKIN INFECTION  Duration: days Location: between thighs History of trauma in area: no Pain: yes Quality:  burning and itching Severity: 10/10 Redness: yes Swelling: no Oozing: no Pus: no Fevers:  sometimes Nausea/vomiting: no Status: worse Treatments attempted:warm compresses, tried to lance herself, but came back   Past Medical History:  Diagnosis Date   Chronic lower back pain    Hydradenitis    Seizures ([place])     Past Surgical History:  Procedure Laterality Date   DILATION AND CURETTAGE OF UTERUS     3 miscarriages   HYDRADENITIS EXCISION     R underarm   TUBAL LIGATION     TUMOR REMOVAL  07/2020   index finger on L hand    Family History  Problem Relation Age of Onset   Seizures Mother    Diabetes Mother    Heart attack Father    Hypertension Brother    Eczema Daughter    Anxiety disorder Maternal Grandmother    Hypertension Brother    Hypertension Brother    Hypertension Brother    Hypertension Brother     Social History   Socioeconomic History   Marital status: Single    Spouse name: Not on file   Number of children: Not on file   Years of education: Not on file   Highest education level: Not on file  Occupational History   Not on file  Tobacco Use   Smoking status: Every Day    Packs/day: 0.50    Types: Cigarettes   Smokeless tobacco:  Never  Vaping Use   Vaping Use: Never used  Substance and Sexual Activity   Alcohol use: No   Drug use: No   Sexual activity: Not Currently  Other Topics Concern   Not on file  Social History Narrative   Not on file   Social Determinants of Health   Financial Resource Strain: Not on file  Food Insecurity: Not on file  Transportation Needs: Not on file  Physical Activity: Not on file  Stress: Not on file  Social Connections: Not on file  Intimate Partner Violence: Not on file    Outpatient Medications Prior to Visit  Medication Sig Dispense Refill   clotrimazole (LOTRIMIN) 1 % cream Apply 1 application topically 2 (two) times daily. 30 g 0   cyclobenzaprine (FLEXERIL) 10 MG tablet Take 1 tablet (10 mg total) by mouth 3 (three) times daily as needed for muscle spasms. 30 tablet 0   fluticasone (FLONASE) 50 MCG/ACT nasal spray Place 2 sprays into both nostrils daily. 16 g 6   omeprazole (PRILOSEC OTC) 20 MG tablet Take 20 mg by mouth daily. 28 tablet 1   phenytoin (DILANTIN) 100 MG ER capsule TAKE 1 CAPSULE(100 MG) BY MOUTH THREE TIMES  DAILY 90 capsule 2   amoxicillin-clavulanate (AUGMENTIN) 875-125 MG tablet Take 1 tablet by mouth 2 (two) times daily. (Patient not taking: Reported on 06/21/2021) 20 tablet 0   No facility-administered medications prior to visit.    Allergies  Allergen Reactions   Citrus Hives   Sulfamethoxazole-Trimethoprim Hives   Butorphanol Hives   Butorphanol Tartrate Hives   Contrast Media [Iodinated Diagnostic Agents] Nausea And Vomiting   Food Other (See Comments)    Allergic to Citrus Fruits.   Ibuprofen    Ketorolac    Lorazepam Hives   Naproxen    Prochlorperazine Hives   Nsaids Rash    Review of Systems  Constitutional:  Positive for fatigue.  Respiratory: Negative.    Cardiovascular: Negative.   Gastrointestinal: Negative.   Skin:        Boil to inner thigh      Objective:    Physical Exam Vitals and nursing note reviewed.   Constitutional:      General: She is not in acute distress.    Appearance: Normal appearance.  HENT:     Head: Normocephalic.  Eyes:     Conjunctiva/sclera: Conjunctivae normal.  Cardiovascular:     Rate and Rhythm: Normal rate and regular rhythm.     Pulses: Normal pulses.     Heart sounds: Normal heart sounds.  Pulmonary:     Effort: Pulmonary effort is normal.     Breath sounds: Normal breath sounds.  Musculoskeletal:     Cervical back: Normal range of motion.  Skin:    General: Skin is warm.     Comments: 2 small raised lesions to inner thigh, no large area of fluctuance   Neurological:     General: No focal deficit present.     Mental Status: She is alert and oriented to person, place, and time.  Psychiatric:        Mood and Affect: Mood normal.        Behavior: Behavior normal.        Thought Content: Thought content normal.        Judgment: Judgment normal.    BP (!) 122/96   Pulse (!) 112   Temp 98.6 F (37 C) (Oral)   Wt 170 lb 3.2 oz (77.2 kg)   SpO2 97%   BMI 29.21 kg/m  Wt Readings from Last 3 Encounters:  06/21/21 170 lb 3.2 oz (77.2 kg)  06/10/21 169 lb (76.7 kg)  06/07/21 172 lb (78 kg)    Health Maintenance Due  Topic Date Due   COVID-19 Vaccine (1) Never done   Pneumococcal Vaccine 72-11 Years old (1 - PCV) Never done    There are no preventive care reminders to display for this patient.   Lab Results  Component Value Date   TSH 1.790 12/31/2020   Lab Results  Component Value Date   WBC 9.4 12/09/2020   HGB 12.4 12/09/2020   HCT 37.0 12/09/2020   MCV 82.4 12/09/2020   PLT 326 12/09/2020   Lab Results  Component Value Date   NA 134 (L) 12/09/2020   K 3.9 12/09/2020   CO2 22 12/09/2020   GLUCOSE 166 (H) 12/09/2020   BUN 16 12/09/2020   CREATININE 0.65 12/09/2020   BILITOT 0.5 12/09/2020   ALKPHOS 68 12/09/2020   AST 18 12/09/2020   ALT 15 12/09/2020   PROT 7.8 12/09/2020   ALBUMIN 3.8 12/09/2020   CALCIUM 9.2 12/09/2020    ANIONGAP 9 12/09/2020   EGFR 94  12/01/2020   Lab Results  Component Value Date   CHOL 249 (H) 12/31/2020   Lab Results  Component Value Date   HDL 49 12/31/2020   Lab Results  Component Value Date   LDLCALC 158 (H) 12/31/2020   Lab Results  Component Value Date   TRIG 228 (H) 12/31/2020   Lab Results  Component Value Date   CHOLHDL 4.7 03/12/2016   Lab Results  Component Value Date   HGBA1C 5.4 12/31/2020       Assessment & Plan:   Problem List Items Addressed This Visit   None Visit Diagnoses     Folliculitis    -  Primary   Too small to drain. Continue warm compresses. Will treat with doxycycline. Avoid shaving the area. Declined CHG saying it made it worse in the past.     Assistance needed with transportation       Does not have ride to neurosurgery or ultrasound appointments. Will reach out to CCM to see if there is any assistance available.    Relevant Orders   AMB Referral to Community Care Coordinaton        Meds ordered this encounter  Medications   doxycycline (VIBRA-TABS) 100 MG tablet    Sig: Take 1 tablet (100 mg total) by mouth 2 (two) times daily.    Dispense:  20 tablet    Refill:  0     Charyl Dancer, NP

## 2021-06-22 ENCOUNTER — Ambulatory Visit: Payer: Medicaid Other | Attending: Nurse Practitioner

## 2021-06-24 ENCOUNTER — Other Ambulatory Visit: Payer: Self-pay

## 2021-06-24 ENCOUNTER — Ambulatory Visit: Payer: Self-pay

## 2021-06-24 ENCOUNTER — Ambulatory Visit: Payer: Self-pay | Admitting: *Deleted

## 2021-06-24 MED ORDER — CYCLOBENZAPRINE HCL 10 MG PO TABS: 10.0000 mg | ORAL_TABLET | Freq: Three times a day (TID) | ORAL | 1 refills | Status: DC | PRN

## 2021-06-24 NOTE — Progress Notes (Signed)
Flexeril refilled per request

## 2021-06-24 NOTE — Telephone Encounter (Signed)
Pt. Called back and states she is going to ED.

## 2021-06-24 NOTE — Telephone Encounter (Signed)
Noted  

## 2021-06-24 NOTE — Addendum Note (Signed)
Addended by: Rodman Pickle A on: 06/24/2021 02:17 PM   Modules accepted: Orders

## 2021-06-24 NOTE — Telephone Encounter (Signed)
  Patient calling back to request what is going to be done regarding her boil on right inner thigh. Patient was triaged today and was going to ED. Patient reports her ride has not arrived to take her to ED yet. Reviewed with patient need to use gauze of clean cloth to catch drainage from left leg boil and to wash hands after contact with drainage. Encouraged patient she can apply warm compress to right inner thigh for pain control. Will need to be seen by a dr to lance right inner thigh boil. Recommended patient continue with plan to go to ED for evaluation of right inner thigh and pain management. Patient hung up phone. Please advise . Care advise given Patient verbalized to go to ED and hung up phone.    Reason for Disposition  Nursing judgment or information in reference  Answer Assessment - Initial Assessment Questions 1. REASON FOR CALL: "What is your main concern right now?"     What is practice going to do regarding right inner thigh boil? 2. ONSET: "When did the na start?"     na 3. SEVERITY: "How bad is the na?"     na 4. FEVER: "Do you have a fever?"     na 5. OTHER SYMPTOMS: "Do you have any other new symptoms?"     Left inner thigh boil draining  6. TREATMENTS AND RESPONSE: "What have you done so far to try to make this better? What medicines have you used?"     Already triaged today and was going to ED. Patient reports ride has not come yet.  7. PREGNANCY: "Is there any chance you are pregnant?" "When was your last menstrual period?"     na  Protocols used: No Guideline Available-A-AH

## 2021-06-24 NOTE — Telephone Encounter (Signed)
Can you please advise?

## 2021-06-24 NOTE — Telephone Encounter (Signed)
  Pt. States "I'm still having pain with these abscesses I have on my legs. The antibiotic is not helping." States "I had drainage from one that looked like strawberry milk." Asking what she should "do now. I might go to the emergency room." Please advise pt.   Answer Assessment - Initial Assessment Questions 1. APPEARANCE of BOIL: "What does the boil look like?"      Bigger - red 2. LOCATION: "Where is the boil located?"      Right thigh 3. NUMBER: "How many boils are there?"      2 4. SIZE: "How big is the boil?" (e.g., inches, cm; compare to size of a coin or other object)     Tip of finger 5. ONSET: "When did the boil start?"     3 weeks 6. PAIN: "Is there any pain?" If Yes, ask: "How bad is the pain?"   (Scale 1-10; or mild, moderate, severe)     11 7. FEVER: "Do you have a fever?" If Yes, ask: "What is it, how was it measured, and when did it start?"      No 8. SOURCE: "Have you been around anyone with boils or other Staph infections?" "Have you ever had boils before?"     No 9. OTHER SYMPTOMS: "Do you have any other symptoms?" (e.g., shaking chills, weakness, rash elsewhere on body)     Pain 10. PREGNANCY: "Is there any chance you are pregnant?" "When was your last menstrual period?"       No  Protocols used: Boil (Skin Abscess)-A-AH

## 2021-06-24 NOTE — Patient Instructions (Signed)
Visit Information  Karina Anderson was given information about Medicaid Managed Care team care coordination services as a part of their [facility] Community Plan Medicaid benefit. Karina Anderson verbally consented to engagement with the [facility] Managed Care team.   If you are experiencing a medical emergency, please call 911 or report to your local emergency department or urgent care.   If you have a non-emergency medical problem during routine business hours, please contact your provider's office and ask to speak with a nurse.   For questions related to your [facility], please call: [phone] or visit the homepage here: [email or web address]  If you would like to schedule transportation through your [facility], please call the following number at least 2 days in advance of your appointment: [phone].   Call the Behavioral Health Crisis Line at [phone], at any time, 24 hours a day, 7 days a week. If you are in danger or need immediate medical attention call 911.  If you would like help to quit smoking, call 1-800-QUIT-NOW ((248) 333-0761) OR Espaol: 1-855-Djelo-Ya (1-287-867-6720) o para ms informacin haga clic aqu or Text READY to 947-096 to register via text  Karina Anderson - following are the goals we discussed in your visit today:   Goals Addressed   None     Please see education materials related to today's visit provided as print materials.   The patient verbalized understanding of instructions provided today and agreed to receive a mailed copy of patient instruction and/or educational materials.  The Managed Medicaid care management team will reach out to the patient again over the next 21 days.   [name] RN, BSN Community Care Coordinator [place]  [facility] Mobile: [phone]   Following is a copy of your plan of care:  Patient  Care Plan: RN Care Manager Plan of Care     Problem Identified: Chronic Disease Management and Care Coordination Needs for Chronic Pain & Skin Issues   Priority: High     Long-Range Goal: Development of Plan of Care for Chronic Disease Management and Care Coordination Needs (Chronic Pain and Skin Issues)   Start Date: 06/24/2021  Expected End Date: 10/22/2021  Priority: High  Note:   Current Barriers:  Knowledge Deficits related to plan of care for management of chronic low back pain and skin issues Care Coordination needs related to Financial constraints related to utilities and Housing barriers Chronic Disease Management support and education needs related to chronic low back pain and skin issues. Financial Constraints No Advanced Directives in place  RNCM Clinical Goal(s): New Goals Patient will verbalize understanding of plan for management of chronic low back pain and skin issues. verbalize basic understanding of  chronic low back pain and skin issues disease process and self health management plan   take all medications exactly as prescribed and will call provider for medication related questions attend all scheduled medical appointments: No scheduled future appointments demonstrate Ongoing adherence to prescribed treatment plan for chronic low back pain and skin issues as evidenced by reduced pain on pain scale and improvement of skin issues. demonstrate Ongoing health management independence   continue to work with RN Care Manager to address care management and care coordination needs related to  Chronic low back pain and skin issues work with Child psychotherapist to address  related to the management of Financial constraints related to utilities and Housing barriers related to the management of chronic low back pain and skin issues  through collaboration with RN Care manager, provider, and care team.   Interventions: Inter-disciplinary care team collaboration (see longitudinal plan of  care) Evaluation of current treatment plan related to  self management and patient's adherence to plan as established by provider  Pain Interventions: New Goal Pain assessment performed Medications reviewed Reviewed provider established plan for pain management; Discussed importance of adherence to all scheduled medical appointments; Counseled on the importance of reporting any/all new or changed pain symptoms or management strategies to pain management provider; Advised patient to report to care team affect of pain on daily activities; Reviewed with patient prescribed pharmacological and nonpharmacological pain relief strategies; Screening for signs and symptoms of depression related to chronic disease state;  Assessed social determinant of health barriers;   Skin Issues: Bilateral leg abscesses  New goal. Evaluation of current treatment plan related to  bilateral leg abscesses , Financial constraints related to utilities and Housing barriers self-management and patient's adherence to plan as established by provider. Discussed plans with patient for ongoing care management follow up and provided patient with direct contact information for care management team Evaluation of current treatment plan related to skin issues and patient's adherence to plan as established by provider; Reviewed medications with patient and discussed  ; Social Work referral for financial assistance with utilities and rent, plus patient's request to find a new PCP covered by [employer] Screening for signs and symptoms of depression related to chronic disease state;  Assessed social determinant of health barriers;   Patient Goals/Self-Care Activities: Patient will self administer medications as prescribed Patient will attend all scheduled provider appointments Patient will call pharmacy for medication refills Patient will continue to perform ADL's independently Patient will continue to perform  IADL's independently Patient will call provider office for new concerns or questions Patient will work with BSW to address care coordination needs and will continue to work with the clinical team to address health care and disease management related needs.    Follow Up Plan:  Telephone follow up appointment with care management team member scheduled for:  07/14/2021 at 10:00 AM The patient has been provided with contact information for the care management team and has been advised to call with any health related questions or concerns.  The care management team will reach out to the patient again over the next 21 days.

## 2021-06-24 NOTE — Telephone Encounter (Signed)
From last visit, if they are draining, we are unable to lance them. They were also too small to be drained last visit. I can place a referral to dermatology if she would be interested.

## 2021-06-24 NOTE — Telephone Encounter (Signed)
Please see patient's concern below and advise accordingly.

## 2021-06-24 NOTE — Patient Outreach (Signed)
Medicaid Managed Care   Nurse Care Manager Note  06/24/2021 Name:  Lidwina Kaner MRN:  [id number] DOB:  Jul 03, 1971  Kathe Becton Chesler is an 50 y.o. year old female who is a primary patient of McElwee, Lauren A, NP.  The [facility] Managed Care Coordination team was consulted for assistance with:    Chronic low back pain and skin issues  Ms. Korver was given information about [employer] team services today. [name] Mesta Patient agreed to services and verbal consent obtained.  Engaged with patient by telephone for initial visit in response to provider referral for case management and/or care coordination services.   Assessments/Interventions:  Review of past medical history, allergies, medications, health status, including review of consultants reports, laboratory and other test data, was performed as part of comprehensive evaluation and provision of chronic care management services.  SDOH (Social Determinants of Health) assessments and interventions performed: SDOH Interventions    Flowsheet Row Most Recent Value  SDOH Interventions   Food Insecurity Interventions Intervention Not Indicated  Financial Strain Interventions Other (Comment)  [Referral to [facility]]  Housing Interventions Other (Comment)  [Referral to [facility]]  Physical Activity Interventions Intervention Not Indicated  Social Connections Interventions Intervention Not Indicated  Transportation Interventions Intervention Not Indicated       Care Plan  Allergies  Allergen Reactions   Citrus Hives   Sulfamethoxazole-Trimethoprim Hives   Butorphanol Hives   Butorphanol Tartrate Hives   Contrast Media [Iodinated Diagnostic Agents] Nausea And Vomiting   Food Other (See Comments)    Allergic to Citrus Fruits.   Ibuprofen    Ketorolac    Lorazepam Hives   Naproxen    Prochlorperazine Hives   Nsaids Rash    Medications Reviewed Today     Reviewed by Leane Call, RN (Case Manager) on  06/24/21 at 1307  Med List Status: <None>   Medication Order Taking? Sig Documenting Provider Last Dose Status Informant  clotrimazole (LOTRIMIN) 1 % cream [id number] No Apply 1 application topically 2 (two) times daily.  Patient not taking: Reported on 06/24/2021   Gerre Scull, NP Not Taking Active            Med Note [facility], Marisue Humble A   Thu Jun 24, 2021  1:06 PM) Using PRN  cyclobenzaprine (FLEXERIL) 10 MG tablet [id number] No Take 1 tablet (10 mg total) by mouth 3 (three) times daily as needed for muscle spasms.  Patient not taking: Reported on 06/24/2021   Gerre Scull, NP Not Taking Active            Med Note [facility], Marisue Humble A   Thu Jun 24, 2021  1:07 PM) Needs refill  doxycycline (VIBRA-TABS) 100 MG tablet [id number] Yes Take 1 tablet (100 mg total) by mouth 2 (two) times daily. McElwee, Lauren A, NP Taking Active   fluticasone (FLONASE) 50 MCG/ACT nasal spray [id number] No Place 2 sprays into both nostrils daily.  Patient not taking: Reported on 06/24/2021   Gerre Scull, NP Not Taking Active            Med Note [facility], Marisue Humble A   Thu Jun 24, 2021  1:07 PM) Using PRN  omeprazole (PRILOSEC OTC) 20 MG tablet [id number] Yes Take 20 mg by mouth daily. [provider] Taking Active   phenytoin (DILANTIN) 100 MG ER capsule [id number] Yes TAKE 1 CAPSULE(100 MG) BY MOUTH THREE TIMES DAILY Cannady, Dorie Rank, NP Taking Active  Patient Active Problem List   Diagnosis Date Noted   Chronic pain syndrome 04/07/2021   Pharmacologic therapy 04/07/2021   Disorder of skeletal system 04/07/2021   Problems influencing health status 04/07/2021   Arthritis of left knee 04/01/2021   Diarrhea 03/23/2021   Nausea and vomiting 03/23/2021   Impaired fasting glucose 12/31/2020   History of psychosis 12/01/2020   History of suicide attempt 12/01/2020   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   Bone lesion 02/28/2020   Tobacco use  disorder 03/13/2016   Cannabis use disorder, moderate, dependence (HCC) 03/13/2016   Bipolar affective disorder, manic, severe, with psychotic behavior (HCC) 03/12/2016   Seizures (HCC) 03/11/2016   Involuntary commitment 03/11/2016   Syphilis, unspecified 08/29/1898    Conditions to be addressed/monitored per PCP order:   Chronic low back pain and skin issues  Care Plan : RN Care Manager Plan of Care  Updates made by Leane Call, RN since 06/24/2021 12:00 AM     Problem: Chronic Disease Management and Care Coordination Needs for Chronic Pain & Skin Issues   Priority: High     Long-Range Goal: Development of Plan of Care for Chronic Disease Management and Care Coordination Needs (Chronic Pain and Skin Issues)   Start Date: 06/24/2021  Expected End Date: 10/22/2021  Priority: High  Note:   Current Barriers:  Knowledge Deficits related to plan of care for management of chronic low back pain and skin issues Care Coordination needs related to Financial constraints related to utilities and Housing barriers Chronic Disease Management support and education needs related to chronic low back pain and skin issues. Financial Constraints No Advanced Directives in place  RNCM Clinical Goal(s): New Goals Patient will verbalize understanding of plan for management of chronic low back pain and skin issues. verbalize basic understanding of  chronic low back pain and skin issues disease process and self health management plan   take all medications exactly as prescribed and will call provider for medication related questions attend all scheduled medical appointments: No scheduled future appointments demonstrate Ongoing adherence to prescribed treatment plan for chronic low back pain and skin issues as evidenced by reduced pain on pain scale and improvement of skin issues. demonstrate Ongoing health management independence   continue to work with RN Care Manager to address care management  and care coordination needs related to  Chronic low back pain and skin issues work with Child psychotherapist to address  related to the management of Financial constraints related to utilities and Housing barriers related to the management of chronic low back pain and skin issues  through collaboration with Medical illustrator, provider, and care team.   Interventions: Inter-disciplinary care team collaboration (see longitudinal plan of care) Evaluation of current treatment plan related to  self management and patient's adherence to plan as established by provider  Pain Interventions: New Goal Pain assessment performed Medications reviewed Reviewed provider established plan for pain management; Discussed importance of adherence to all scheduled medical appointments; Counseled on the importance of reporting any/all new or changed pain symptoms or management strategies to pain management provider; Advised patient to report to care team affect of pain on daily activities; Reviewed with patient prescribed pharmacological and nonpharmacological pain relief strategies; Screening for signs and symptoms of depression related to chronic disease state;  Assessed social determinant of health barriers;   Skin Issues: Bilateral leg abscesses  New goal. Evaluation of current treatment plan related to  bilateral leg abscesses , Financial  constraints related to utilities and Housing barriers self-management and patient's adherence to plan as established by provider. Discussed plans with patient for ongoing care management follow up and provided patient with direct contact information for care management team Evaluation of current treatment plan related to skin issues and patient's adherence to plan as established by provider; Reviewed medications with patient and discussed  ; Social Work referral for financial assistance with utilities and rent, plus patient's request to find a new PCP covered by [employer] Screening for signs and symptoms of depression related to chronic disease state;  Assessed social determinant of health barriers;   Patient Goals/Self-Care Activities: Patient will self administer medications as prescribed Patient will attend all scheduled provider appointments Patient will call pharmacy for medication refills Patient will continue to perform ADL's independently Patient will continue to perform IADL's independently Patient will call provider office for new concerns or questions Patient will work with BSW to address care coordination needs and will continue to work with the clinical team to address health care and disease management related needs.    Follow Up Plan:  Telephone follow up appointment with care management team member scheduled for:  07/14/2021 at 10:00 AM The patient has been provided with contact information for the care management team and has been advised to call with any health related questions or concerns.  The care management team will reach out to the patient again over the next 21 days.      Follow Up:  Patient agrees to Care Plan and Follow-up.  Plan: The Managed Medicaid care management team will reach out to the patient again over the next 21 days.  Date/time of next scheduled RN care management/care coordination outreach:  07/14/2021 at 10:00 am  [name] RN, BSN [facility]  [facility] Mobile: [phone]

## 2021-06-25 NOTE — Telephone Encounter (Signed)
Patient is not interested in a referral, will follow up as needed.

## 2021-06-26 ENCOUNTER — Emergency Department
Admission: EM | Admit: 2021-06-26 | Discharge: 2021-06-26 | Disposition: A | Discharge status: Home / Self Care | Payer: Medicaid Other | Attending: Emergency Medicine | Admitting: Emergency Medicine

## 2021-06-26 ENCOUNTER — Other Ambulatory Visit: Payer: Self-pay

## 2021-06-26 ENCOUNTER — Encounter: Payer: Self-pay | Admitting: Intensive Care

## 2021-06-26 DIAGNOSIS — F17210 Nicotine dependence, cigarettes, uncomplicated: Secondary | ICD-10-CM | POA: Insufficient documentation

## 2021-06-26 DIAGNOSIS — L739 Follicular disorder, unspecified: Secondary | ICD-10-CM | POA: Diagnosis not present

## 2021-06-26 MED ORDER — LIDOCAINE-PRILOCAINE 2.5-2.5 % EX CREA
TOPICAL_CREAM | Freq: Once | CUTANEOUS | Status: AC
  Filled 2021-06-26: qty 5

## 2021-06-26 MED ORDER — ONDANSETRON 4 MG PO TBDP
4.0000 mg | ORAL_TABLET | Freq: Once | ORAL | Status: AC
  Administered 2021-06-26: 4 mg via ORAL
  Filled 2021-06-26: qty 1

## 2021-06-26 MED ORDER — DOXYCYCLINE MONOHYDRATE 100 MG PO TABS: 100.0000 mg | ORAL_TABLET | Freq: Two times a day (BID) | ORAL | 0 refills | Status: AC

## 2021-06-26 MED ORDER — OXYCODONE-ACETAMINOPHEN 5-325 MG PO TABS
1.0000 | ORAL_TABLET | Freq: Once | ORAL | Status: AC
Start: 2021-06-26 — End: 2021-06-26
  Administered 2021-06-26: 1 via ORAL
  Filled 2021-06-26: qty 1

## 2021-06-26 NOTE — ED Provider Notes (Signed)
[facility]-EMERGENCY DEPARTMENT  ____________________________________________  Time seen: Approximately 4:44 PM  I have reviewed the triage vital signs and the nursing notes.   HISTORY  Chief Complaint Abscess   Historian Patient     HPI Kathe Becton Middlekauff is a 50 y.o. female presents to the emergency department with concern for 2 abscesses.  Patient has an area of folliculitis that it spontaneously draining along the right aspect of the mons pubis and a region of left inner thigh folliculitis.  Patient is requesting incision and drainage for both.  Explained to patient that we do not do incision and drainage for a spontaneously draining abscess.  Patient has had no fever or chills but states that she has had significant pain.   Past Medical History:  Diagnosis Date   Chronic lower back pain    Hydradenitis    Seizures (HCC)      Immunizations up to date:  Yes.     Past Medical History:  Diagnosis Date   Chronic lower back pain    Hydradenitis    Seizures (HCC)     Patient Active Problem List   Diagnosis Date Noted   Chronic pain syndrome 04/07/2021   Pharmacologic therapy 04/07/2021   Disorder of skeletal system 04/07/2021   Problems influencing health status 04/07/2021   Arthritis of left knee 04/01/2021   Diarrhea 03/23/2021   Nausea and vomiting 03/23/2021   Impaired fasting glucose 12/31/2020   History of psychosis 12/01/2020   History of suicide attempt 12/01/2020   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   Bone lesion 02/28/2020   Tobacco use disorder 03/13/2016   Cannabis use disorder, moderate, dependence (HCC) 03/13/2016   Bipolar affective disorder, manic, severe, with psychotic behavior (HCC) 03/12/2016   Seizures (HCC) 03/11/2016   Involuntary commitment 03/11/2016   Syphilis, unspecified 08/29/1898    Past Surgical History:  Procedure Laterality Date   DILATION AND CURETTAGE OF UTERUS     3 miscarriages   HYDRADENITIS  EXCISION     R underarm   TUBAL LIGATION     TUMOR REMOVAL  07/2020   index finger on L hand    Prior to Admission medications   Medication Sig Start Date End Date Taking? Authorizing Provider  doxycycline (ADOXA) 100 MG tablet Take 1 tablet (100 mg total) by mouth 2 (two) times daily for 7 days. 06/26/21 07/03/21 Yes Pia Mau M, PA-C  clotrimazole (LOTRIMIN) 1 % cream Apply 1 application topically 2 (two) times daily. Patient not taking: Reported on 06/24/2021 04/27/21   Gerre Scull, NP  cyclobenzaprine (FLEXERIL) 10 MG tablet Take 1 tablet (10 mg total) by mouth 3 (three) times daily as needed for muscle spasms. 06/24/21   McElwee, Lauren A, NP  doxycycline (VIBRA-TABS) 100 MG tablet Take 1 tablet (100 mg total) by mouth 2 (two) times daily. 06/21/21   McElwee, Lauren A, NP  fluticasone (FLONASE) 50 MCG/ACT nasal spray Place 2 sprays into both nostrils daily. Patient not taking: Reported on 06/24/2021 04/27/21   Gerre Scull, NP  omeprazole (PRILOSEC OTC) 20 MG tablet Take 20 mg by mouth daily. 05/28/21   [provider]  phenytoin (DILANTIN) 100 MG ER capsule TAKE 1 CAPSULE(100 MG) BY MOUTH THREE TIMES DAILY 04/26/21   Cannady, Corrie Dandy T, NP    Allergies Citrus, Sulfamethoxazole-trimethoprim, Butorphanol, Butorphanol tartrate, Contrast media [iodinated diagnostic agents], Food, Ibuprofen, Ketorolac, Lorazepam, Naproxen, Prochlorperazine, and Nsaids  Family History  Problem Relation Age of Onset   Seizures Mother  Diabetes Mother    Heart attack Father    Hypertension Brother    Eczema Daughter    Anxiety disorder Maternal Grandmother    Hypertension Brother    Hypertension Brother    Hypertension Brother    Hypertension Brother     Social History Social History   Tobacco Use   Smoking status: Every Day    Packs/day: 0.50    Types: Cigarettes   Smokeless tobacco: Never   Tobacco comments:    Has smoked since the age of 50 yrs old.  Vaping Use    Vaping Use: Never used  Substance Use Topics   Alcohol use: No    Comment: "Rarely"   Drug use: No     Review of Systems  Constitutional: No fever/chills Eyes:  No discharge ENT: No upper respiratory complaints. Respiratory: no cough. No SOB/ use of accessory muscles to breath Gastrointestinal:   No nausea, no vomiting.  No diarrhea.  No constipation. Musculoskeletal: Negative for musculoskeletal pain. Skin: Patient has folliculitis.    ____________________________________________   PHYSICAL EXAM:  VITAL SIGNS: ED Triage Vitals  Enc Vitals Group     BP 06/26/21 1512 (!) 132/101     Pulse Rate 06/26/21 1512 (!) 107     Resp 06/26/21 1512 16     Temp 06/26/21 1512 98.8 F (37.1 C)     Temp Source 06/26/21 1512 Oral     SpO2 06/26/21 1512 97 %     Weight 06/26/21 1513 170 lb (77.1 kg)     Height 06/26/21 1513 5\' 5"  (1.651 m)     Head Circumference --      Peak Flow --      Pain Score 06/26/21 1513 10     Pain Loc --      Pain Edu? --      Excl. in GC? --      Constitutional: Alert and oriented. Well appearing and in no acute distress. Eyes: Conjunctivae are normal. PERRL. EOMI. Head: Atraumatic. ENT: Cardiovascular: Normal rate, regular rhythm. Normal S1 and S2.  Good peripheral circulation. Respiratory: Normal respiratory effort without tachypnea or retractions. Lungs CTAB. Good air entry to the bases with no decreased or absent breath sounds Gastrointestinal: Bowel sounds x 4 quadrants. Soft and nontender to palpation. No guarding or rigidity. No distention. Musculoskeletal: Full range of motion to all extremities. No obvious deformities noted Neurologic:  Normal for age. No gross focal neurologic deficits are appreciated.  Skin:  Patient has folliculitis.  Psychiatric: Mood and affect are normal for age. Speech and behavior are normal.   ____________________________________________   LABS (all labs ordered are listed, but only abnormal results are  displayed)  Labs Reviewed - No data to display ____________________________________________  EKG   ____________________________________________  RADIOLOGY   No results found.  ____________________________________________    PROCEDURES  Procedure(s) performed:     10/31/22Marland KitchenIncision and Drainage  Date/Time: 06/26/2021 4:45 PM Performed by: 06/28/2021, PA-C Authorized by: Orvil Feil, PA-C   Consent:    Consent obtained:  Verbal   Risks discussed:  Bleeding Universal protocol:    Procedure explained and questions answered to patient or proxy's satisfaction: yes     Patient identity confirmed:  Verbally with patient Location:    Type:  Abscess   Location:  Lower extremity   Lower extremity location:  Leg   Leg location:  L upper leg Pre-procedure details:    Skin preparation:  Povidone-iodine Sedation:    Sedation  type:  None Anesthesia:    Anesthesia method:  Topical application   Topical anesthetic:  EMLA cream Procedure type:    Complexity:  Simple Procedure details:    Ultrasound guidance: no     Needle aspiration: yes     Needle size:  18 G   Incision depth:  Dermal   Drainage:  Purulent   Drainage amount:  Scant   Packing materials:  None Post-procedure details:    Procedure completion:  Tolerated     Medications  lidocaine-prilocaine (EMLA) cream ( Topical Given 06/26/21 1642)  oxyCODONE-acetaminophen (PERCOCET/ROXICET) 5-325 MG per tablet 1 tablet (1 tablet Oral Given 06/26/21 1609)  ondansetron (ZOFRAN-ODT) disintegrating tablet 4 mg (4 mg Oral Given 06/26/21 1609)     ____________________________________________   INITIAL IMPRESSION / ASSESSMENT AND PLAN / ED COURSE  Pertinent labs & imaging results that were available during my care of the patient were reviewed by me and considered in my medical decision making (see chart for details).      Assessment and Plan:  Folliculitis 50 year old female presents to the emergency  department with 2 regions of folliculitis.  Explained to patient that region of folliculitis along mons pubis is spontaneously draining with no need for incision and drainage.  Patient underwent incision and drainage for region of folliculitis along left upper thigh.  She was started on doxycycline twice daily for the next 7 days.  Return precautions were given to return with new or worsening symptoms.    ____________________________________________  FINAL CLINICAL IMPRESSION(S) / ED DIAGNOSES  Final diagnoses:  Folliculitis      NEW MEDICATIONS STARTED DURING THIS VISIT:  ED Discharge Orders          Ordered    doxycycline (ADOXA) 100 MG tablet  2 times daily        06/26/21 1652                This chart was dictated using voice recognition software/Dragon. Despite best efforts to proofread, errors can occur which can change the meaning. Any change was purely unintentional.     Orvil Feil, PA-C 06/26/21 1656    Georga Hacking, MD 06/26/21 [phone]

## 2021-06-26 NOTE — ED Notes (Signed)
Patient given discharge instructions, all questions answered. Patient in possession of all belongings, directed to the discharge area  

## 2021-06-26 NOTE — ED Triage Notes (Signed)
Patient reports recurrent abscesses. Reports two different sites. HX of same and having drained. C/o pain at site

## 2021-06-26 NOTE — Discharge Instructions (Signed)
Take Doxycycline twice daily for seven days.  

## 2021-06-27 ENCOUNTER — Other Ambulatory Visit: Payer: Self-pay | Admitting: Nurse Practitioner

## 2021-06-27 NOTE — Telephone Encounter (Signed)
dc'd 06/10/21 "pt preference" Rodman Pickle DNP

## 2021-06-28 ENCOUNTER — Telehealth: Payer: Self-pay

## 2021-06-28 ENCOUNTER — Ambulatory Visit: Payer: Self-pay

## 2021-06-28 ENCOUNTER — Ambulatory Visit: Payer: Self-pay | Admitting: *Deleted

## 2021-06-28 MED ORDER — LIDOCAINE VISCOUS HCL 2 % MT SOLN: 15.0000 mL | OROMUCOSAL | 0 refills | Status: DC | PRN

## 2021-06-28 NOTE — Addendum Note (Signed)
Addended by: Rodman Pickle A on: 06/28/2021 12:41 PM   Modules accepted: Orders

## 2021-06-28 NOTE — Telephone Encounter (Signed)
Reason for Disposition  SEVERE pain (e.g., excruciating)  Answer Assessment - Initial Assessment Questions 1. APPEARANCE of BOIL: "What does the boil look like?"      Boil on left inner thigh stopped draining and is bigger. Right inner thigh is draining .  2. LOCATION: "Where is the boil located?"      Right and left inner thighs 3. NUMBER: "How many boils are there?"      2  4. SIZE: "How big is the boil?" (e.g., inches, cm; compare to size of a coin or other object)     Left inner thigh is size of "end of thumb".  5. ONSET: "When did the boil start?"     na 6. PAIN: "Is there any pain?" If Yes, ask: "How bad is the pain?"   (Scale 1-10; or mild, moderate, severe)     Severe, can barely walk 7. FEVER: "Do you have a fever?" If Yes, ask: "What is it, how was it measured, and when did it start?"      Yes  high as 102 8. SOURCE: "Have you been around anyone with boils or other Staph infections?" "Have you ever had boils before?"    Had boils before  9. OTHER SYMPTOMS: "Do you have any other symptoms?" (e.g., shaking chills, weakness, rash elsewhere on body)     Difficulty walking  10. PREGNANCY: "Is there any chance you are pregnant?" "When was your last menstrual period?"       na  Protocols used: Boil (Skin Abscess)-A-AH

## 2021-06-28 NOTE — Telephone Encounter (Signed)
  Pt. Reports she went to ED for "boils on my thighs." "They cut the one on my right thigh.Said the other one is draining." Declines appointment. Asking for "some pain medicine." Please advise pt.    Answer Assessment - Initial Assessment Questions 1. APPEARANCE of BOIL: "What does the boil look like?"      2 on each thigh 2. LOCATION: "Where is the boil located?"      Both thighs 3. NUMBER: "How many boils are there?"      2 4. SIZE: "How big is the boil?" (e.g., inches, cm; compare to size of a coin or other object)     Tip of finger 5. ONSET: "When did the boil start?"     2 WEEKS ago 6. PAIN: "Is there any pain?" If Yes, ask: "How bad is the pain?"   (Scale 1-10; or mild, moderate, severe)     10 7. FEVER: "Do you have a fever?" If Yes, ask: "What is it, how was it measured, and when did it start?"      No 8. SOURCE: "Have you been around anyone with boils or other Staph infections?" "Have you ever had boils before?"     No 9. OTHER SYMPTOMS: "Do you have any other symptoms?" (e.g., shaking chills, weakness, rash elsewhere on body)     No 10. PREGNANCY: "Is there any chance you are pregnant?" "When was your last menstrual period?"       No  Protocols used: Boil (Skin Abscess)-A-AH

## 2021-06-28 NOTE — Telephone Encounter (Signed)
C/o severe pain to inner thighs and difficulty walking. Patient reports she has had boils to inner thighs since last week and has been to ED at least 2 times for pain and treatment. Patient last seen in ED to lance right inner boil and it is now draining. Left inner thigh boil was draining and now stopped and is getting bigger. C/o "barely able to walk". Patient taking tylenol ES and it is not helping with pain. Reports temp. Running as high as 102. Denies temp now. Reviewed appt VV available 07/01/21 due to patient reports she is having difficulty getting a ride to appt. Patient requesting any help for managing boils and treatment of severe pain. Please advise . Care advise given. Patient verbalized understanding of care advise and to call back or go to ED if symptoms worsen.

## 2021-06-28 NOTE — Telephone Encounter (Signed)
Patient is calling back to see if she can get something for pain- patient states she was given 1 percocet yesterday and that helped with her pain until it ran out. Patient has been scheduled at office tomorrow for follow up to her abscess treatment at ED.

## 2021-06-28 NOTE — Telephone Encounter (Signed)
Routing to provider  

## 2021-06-28 NOTE — Telephone Encounter (Signed)
Transition Care Management Follow-up Telephone Call Date of discharge and from where: 06/26/2021 from [facility] How have you been since you were released from the hospital? Pt states that she is in a lot of pain and that another abscess has come up. Pt stated that they refused to drain it. Pt states that she is taking the clindamycin that was previously prescribed for the same issue but it is not helping.  Any questions or concerns? No  Items Reviewed: Did the pt receive and understand the discharge instructions provided? Yes  Medications obtained and verified? Yes  Other? No  Any new allergies since your discharge? No  Dietary orders reviewed? No Do you have support at home? Yes   Functional Questionnaire: (I = Independent and D = Dependent) ADLs: I Bathing/Dressing- I Meal Prep- I Eating- I Maintaining continence- I Transferring/Ambulation- I Managing Meds- I  Follow up appointments reviewed:  PCP Hospital f/u appt confirmed? No   Specialist Hospital f/u appt confirmed? No   Are transportation arrangements needed? No  If their condition worsens, is the pt aware to call PCP or go to the Emergency Dept.? Yes Was the patient provided with contact information for the PCP's office or ED? Yes Was to pt encouraged to call back with questions or concerns? Yes

## 2021-06-28 NOTE — Telephone Encounter (Signed)
See other telephone encounter in chart.

## 2021-06-29 ENCOUNTER — Ambulatory Visit (INDEPENDENT_AMBULATORY_CARE_PROVIDER_SITE_OTHER): Payer: Medicaid Other | Admitting: Nurse Practitioner

## 2021-06-29 ENCOUNTER — Other Ambulatory Visit: Payer: Self-pay

## 2021-06-29 ENCOUNTER — Encounter: Payer: Self-pay | Admitting: Nurse Practitioner

## 2021-06-29 VITALS — BP 110/76 | HR 103 | Temp 98.4°F | Resp 16 | Ht 65.0 in | Wt 173.0 lb

## 2021-06-29 DIAGNOSIS — L739 Follicular disorder, unspecified: Secondary | ICD-10-CM | POA: Diagnosis not present

## 2021-06-29 NOTE — Assessment & Plan Note (Signed)
She had left thigh abscess drained at the ER on Saturday. The area to the right thigh is getting larger today, even with doxycyline and clindamycin. I&D performed to right thigh with moderate amount of tan/brown drainage and small amount of blood. Discussed post I&D instructions, continue doxycycline from ER. Follow up if not improved, worsening symptoms, fevers, redness, or increasing drainage.

## 2021-06-29 NOTE — Progress Notes (Signed)
Acute Office Visit  Subjective:    Patient ID: Karina Anderson, female    DOB: [birth year], 50 y.o.   MRN: [id number]  Chief Complaint  Patient presents with   Hospitalization Follow-up    Abscess inside both thighs. Been there for 3 weeks. A little drainage with pus.     HPI Patient is in today for follow up on abscesses that have been ongoing for 3 weeks. She went to the ER on Saturday and they drained the abscess to her left thigh. The abscess to her right thigh has gotten bigger, even with taking the antibiotics. She denies drainage from the abscess on her right thigh and any fevers.   Past Medical History:  Diagnosis Date   Chronic lower back pain    Hydradenitis    Seizures ([place])     Past Surgical History:  Procedure Laterality Date   DILATION AND CURETTAGE OF UTERUS     3 miscarriages   HYDRADENITIS EXCISION     R underarm   TUBAL LIGATION     TUMOR REMOVAL  07/2020   index finger on L hand    Family History  Problem Relation Age of Onset   Seizures Mother    Diabetes Mother    Heart attack Father    Hypertension Brother    Eczema Daughter    Anxiety disorder Maternal Grandmother    Hypertension Brother    Hypertension Brother    Hypertension Brother    Hypertension Brother     Social History   Socioeconomic History   Marital status: Single    Spouse name: Not on file   Number of children: Not on file   Years of education: Not on file   Highest education level: Not on file  Occupational History   Not on file  Tobacco Use   Smoking status: Every Day    Packs/day: 0.50    Types: Cigarettes   Smokeless tobacco: Never   Tobacco comments:    Has smoked since the age of 50 yrs old.  Vaping Use   Vaping Use: Never used  Substance and Sexual Activity   Alcohol use: No    Comment: "Rarely"   Drug use: No   Sexual activity: Not Currently  Other Topics Concern   Not on file  Social History Narrative   Not on file   Social Determinants of Health    Financial Resource Strain: High Risk   Difficulty of Paying Living Expenses: Hard  Food Insecurity: No Food Insecurity   Worried About Running Out of Food in the Last Year: Never true   Ran Out of Food in the Last Year: Never true  Transportation Needs: No Transportation Needs   Lack of Transportation (Medical): No   Lack of Transportation (Non-Medical): No  Physical Activity: Sufficiently Active   Days of Exercise per Week: 3 days   Minutes of Exercise per Session: 60 min  Stress: Stress Concern Present   Feeling of Stress : Rather much  Social Connections: Unknown   Frequency of Communication with Friends and Family: More than three times a week   Frequency of Social Gatherings with Friends and Family: More than three times a week   Attends Religious Services: Not on file   Active Member of Clubs or Organizations: Not on file   Attends Archivist Meetings: Not on file   Marital Status: Never married  Intimate Partner Violence: Not on file    Outpatient Medications Prior to Visit  Medication Sig Dispense Refill   cyclobenzaprine (FLEXERIL) 10 MG tablet Take 1 tablet (10 mg total) by mouth 3 (three) times daily as needed for muscle spasms. 30 tablet 1   doxycycline (ADOXA) 100 MG tablet Take 1 tablet (100 mg total) by mouth 2 (two) times daily for 7 days. 14 tablet 0   doxycycline (VIBRA-TABS) 100 MG tablet Take 1 tablet (100 mg total) by mouth 2 (two) times daily. 20 tablet 0   lidocaine (XYLOCAINE) 2 % solution Use as directed 15 mLs in the mouth or throat as needed for mouth pain. Apply to painful boils as every 2 hours as needed for pain. 100 mL 0   omeprazole (PRILOSEC OTC) 20 MG tablet Take 20 mg by mouth daily. 28 tablet 1   phenytoin (DILANTIN) 100 MG ER capsule TAKE 1 CAPSULE(100 MG) BY MOUTH THREE TIMES DAILY 90 capsule 2   clotrimazole (LOTRIMIN) 1 % cream Apply 1 application topically 2 (two) times daily. (Patient not taking: No sig reported) 30 g 0    fluticasone (FLONASE) 50 MCG/ACT nasal spray Place 2 sprays into both nostrils daily. (Patient not taking: No sig reported) 16 g 6   No facility-administered medications prior to visit.    Allergies  Allergen Reactions   Citrus Hives   Sulfamethoxazole-Trimethoprim Hives   Butorphanol Hives   Butorphanol Tartrate Hives   Contrast Media [Iodinated Diagnostic Agents] Nausea And Vomiting   Food Other (See Comments)    Allergic to Citrus Fruits.   Ibuprofen    Ketorolac    Lorazepam Hives   Naproxen    Prochlorperazine Hives   Nsaids Rash    Review of Systems  Constitutional: Negative.   Respiratory: Negative.    Cardiovascular: Negative.   Gastrointestinal: Negative.   Genitourinary: Negative.   Musculoskeletal:  Positive for back pain (chronic).  Skin:  Positive for wound.      Objective:    Physical Exam Vitals and nursing note reviewed.  Constitutional:      General: She is not in acute distress.    Appearance: Normal appearance.  HENT:     Head: Normocephalic.  Eyes:     Conjunctiva/sclera: Conjunctivae normal.  Cardiovascular:     Rate and Rhythm: Normal rate.     Pulses: Normal pulses.  Pulmonary:     Effort: Pulmonary effort is normal.  Musculoskeletal:     Cervical back: Normal range of motion.  Skin:    General: Skin is warm.     Comments: Small raised area to left medial thigh with minimal fluctuance 1.5cm x1.5 cm raised area to right medial thigh with moderate fluctuance   Neurological:     General: No focal deficit present.     Mental Status: She is alert and oriented to person, place, and time.  Psychiatric:        Mood and Affect: Mood normal.        Behavior: Behavior normal.        Thought Content: Thought content normal.        Judgment: Judgment normal.    BP 110/76   Pulse (!) 103   Temp 98.4 F (36.9 C) (Oral)   Resp 16   Ht 5' 5" (1.651 m)   Wt 173 lb (78.5 kg)   SpO2 96%   BMI 28.79 kg/m  Wt Readings from Last 3 Encounters:   06/29/21 173 lb (78.5 kg)  06/26/21 170 lb (77.1 kg)  06/21/21 170 lb 3.2 oz (77.2 kg)  Health Maintenance Due  Topic Date Due   COVID-19 Vaccine (1) Never done   Pneumococcal Vaccine 79-93 Years old (1 - PCV) Never done    There are no preventive care reminders to display for this patient.   Lab Results  Component Value Date   TSH 1.790 12/31/2020   Lab Results  Component Value Date   WBC 9.4 12/09/2020   HGB 12.4 12/09/2020   HCT 37.0 12/09/2020   MCV 82.4 12/09/2020   PLT 326 12/09/2020   Lab Results  Component Value Date   NA 134 (L) 12/09/2020   K 3.9 12/09/2020   CO2 22 12/09/2020   GLUCOSE 166 (H) 12/09/2020   BUN 16 12/09/2020   CREATININE 0.65 12/09/2020   BILITOT 0.5 12/09/2020   ALKPHOS 68 12/09/2020   AST 18 12/09/2020   ALT 15 12/09/2020   PROT 7.8 12/09/2020   ALBUMIN 3.8 12/09/2020   CALCIUM 9.2 12/09/2020   ANIONGAP 9 12/09/2020   EGFR 94 12/01/2020   Lab Results  Component Value Date   CHOL 249 (H) 12/31/2020   Lab Results  Component Value Date   HDL 49 12/31/2020   Lab Results  Component Value Date   LDLCALC 158 (H) 12/31/2020   Lab Results  Component Value Date   TRIG 228 (H) 12/31/2020   Lab Results  Component Value Date   CHOLHDL 4.7 03/12/2016   Lab Results  Component Value Date   HGBA1C 5.4 12/31/2020       Assessment & Plan:   Problem List Items Addressed This Visit       Musculoskeletal and Integument   Folliculitis - Primary    She had left thigh abscess drained at the ER on Saturday. The area to the right thigh is getting larger today, even with doxycyline and clindamycin. I&D performed to right thigh with moderate amount of tan/brown drainage and small amount of blood. Discussed post I&D instructions, continue doxycycline from ER. Follow up if not improved, worsening symptoms, fevers, redness, or increasing drainage.       Skin Procedure  Procedure: Incision and drainage of right thigh folliculitis with  abscess  Diagnosis:   ICD-10-CM   1. Folliculitis  E09.2       Lesion Location/Size: 1.5cm x1.5 cm right inner thigh Provider: Vance Peper, NP Consent:  Risks, benefits, and alternative treatments discussed and all questions were answered.  Patient elected to proceed and verbal consent obtained.  Description: Area prepped and draped using semi-sterile technique. Area locally anesthetized using 4 cc's of lidocaine 1% plain. "Using a 15 blade scalpel, a  0.5cm incision was made above the lesion. Cyst cavity entered and mild amount of purulent material expressed.   Adequate hemostastis achieved using  gauze, pressure . Post Procedure Instructions: Wound care instructions discussed and patient was instructed to keep area clean and dry.  Signs and symptoms of infection discussed, patient agrees to contact the office ASAP should they occur.  Dressing change recommended daily.  Follow Up: As needed   No orders of the defined types were placed in this encounter.    Charyl Dancer, NP

## 2021-07-05 ENCOUNTER — Other Ambulatory Visit: Payer: Self-pay

## 2021-07-05 NOTE — Patient Outreach (Signed)
Medicaid Managed Care Social Work Note  07/05/2021 Name:  Jayce Loafman MRN:  [id number] DOB:  [birth year]  Karina Anderson is an 50 y.o. year old female who is a primary patient of McElwee, Lauren A, NP.  The Medicaid Managed Care Coordination team was consulted for assistance with:  Community Resources   Ms. Devins was given information about [employer] team services today. [place] Patient agreed to services and verbal consent obtained.  Engaged with patient  for by telephone forinitial visit in response to referral for case management and/or care coordination services.   Assessments/Interventions:  Review of past medical history, allergies, medications, health status, including review of consultants reports, laboratory and other test data, was performed as part of comprehensive evaluation and provision of chronic care management services.  SDOH: (Social Determinant of Health) assessments and interventions performed: BSW contacted patient regarding financial assistance. Patient stated she needs assistance with utility bill that is $2,000 and she has a cut off notice for 11/29. Patient stated she has already reached out to [facility] and have not heard anything back. Patient asked for resources to be sent to her via text message. Patient stated she would also like a new PCP, patient feels as though the office has her best interest at heart. Patient also stated she would like to move to [facility] due to her rent being increased to 845 monthly and she only gets 841 in disability each month.    Advanced Directives Status:  Not addressed in this encounter.  Care Plan                 Allergies  Allergen Reactions   Citrus Hives   Sulfamethoxazole-Trimethoprim Hives   Butorphanol Hives   Butorphanol Tartrate Hives   Contrast Media [Iodinated Diagnostic Agents] Nausea And Vomiting   Food Other (See Comments)    Allergic to Citrus Fruits.   Ibuprofen     Ketorolac    Lorazepam Hives   Naproxen    Prochlorperazine Hives   Nsaids Rash    Medications Reviewed Today     Reviewed by Charyl Dancer, NP (Nurse Practitioner) on 06/29/21 at 1530  Med List Status: <None>   Medication Order Taking? Sig Documenting Provider Last Dose Status Informant  clotrimazole (LOTRIMIN) 1 % cream [id number] No Apply 1 application topically 2 (two) times daily.  Patient not taking: No sig reported   Charyl Dancer, NP Not Taking Active            Med Note [facility], MAUREEN A   Thu Jun 24, 2021  1:06 PM) Using PRN  cyclobenzaprine (FLEXERIL) 10 MG tablet [id number] Yes Take 1 tablet (10 mg total) by mouth 3 (three) times daily as needed for muscle spasms. McElwee, Lauren A, NP Taking Active   doxycycline (ADOXA) 100 MG tablet [id number] Yes Take 1 tablet (100 mg total) by mouth 2 (two) times daily for 7 days. Vallarie Mare M, PA-C Taking Active   doxycycline (VIBRA-TABS) 100 MG tablet [id number] Yes Take 1 tablet (100 mg total) by mouth 2 (two) times daily. McElwee, Lauren A, NP Taking Active   fluticasone (FLONASE) 50 MCG/ACT nasal spray [id number] No Place 2 sprays into both nostrils daily.  Patient not taking: No sig reported   Charyl Dancer, NP Not Taking Active            Med Note [facility], MAUREEN A   Thu Jun 24, 2021  1:07 PM) Using PRN  lidocaine (XYLOCAINE) 2 %  solution [id number] Yes Use as directed 15 mLs in the mouth or throat as needed for mouth pain. Apply to painful boils as every 2 hours as needed for pain. McElwee, Lauren A, NP Taking Active   omeprazole (PRILOSEC OTC) 20 MG tablet [id number] Yes Take 20 mg by mouth daily. [provider] Taking Active   phenytoin (DILANTIN) 100 MG ER capsule [id number] Yes TAKE 1 CAPSULE(100 MG) BY MOUTH THREE TIMES DAILY Marjie Skiff, NP Taking Active             Patient Active Problem List   Diagnosis Date Noted   Folliculitis 06/29/2021   Chronic pain syndrome 04/07/2021    Pharmacologic therapy 04/07/2021   Disorder of skeletal system 04/07/2021   Problems influencing health status 04/07/2021   Arthritis of left knee 04/01/2021   Diarrhea 03/23/2021   Nausea and vomiting 03/23/2021   Impaired fasting glucose 12/31/2020   History of psychosis 12/01/2020   History of suicide attempt 12/01/2020   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   Bone lesion 02/28/2020   Tobacco use disorder 03/13/2016   Cannabis use disorder, moderate, dependence (HCC) 03/13/2016   Bipolar affective disorder, manic, severe, with psychotic behavior (HCC) 03/12/2016   Seizures (HCC) 03/11/2016   Involuntary commitment 03/11/2016   Syphilis, unspecified 08/29/1898    Conditions to be addressed/monitored per PCP order:   community resources  Care Plan : RN Care Manager Plan of Care  Updates made by Shaune Leeks since 07/05/2021 12:00 AM     Problem: Chronic Disease Management and Care Coordination Needs for Chronic Pain & Skin Issues   Priority: High     Long-Range Goal: Development of Plan of Care for Chronic Disease Management and Care Coordination Needs (Chronic Pain and Skin Issues)   Start Date: 06/24/2021  Expected End Date: 10/22/2021  Priority: High  Note:   Current Barriers:  Knowledge Deficits related to plan of care for management of chronic low back pain and skin issues Care Coordination needs related to Financial constraints related to utilities and Housing barriers Chronic Disease Management support and education needs related to chronic low back pain and skin issues. Financial Constraints No Advanced Directives in place  RNCM Clinical Goal(s): New Goals Patient will verbalize understanding of plan for management of chronic low back pain and skin issues. verbalize basic understanding of  chronic low back pain and skin issues disease process and self health management plan   take all medications exactly as prescribed and will call  provider for medication related questions attend all scheduled medical appointments: No scheduled future appointments demonstrate Ongoing adherence to prescribed treatment plan for chronic low back pain and skin issues as evidenced by reduced pain on pain scale and improvement of skin issues. demonstrate Ongoing health management independence   continue to work with RN Care Manager to address care management and care coordination needs related to  Chronic low back pain and skin issues work with Child psychotherapist to address  related to the management of Financial constraints related to utilities and Housing barriers related to the management of chronic low back pain and skin issues  through collaboration with Medical illustrator, provider, and care team.   Interventions: Inter-disciplinary care team collaboration (see longitudinal plan of care) Evaluation of current treatment plan related to  self management and patient's adherence to plan as established by provider 07/05/21: BSW contacted patient regarding financial assistance. Patient stated she needs assistance with utility bill that is $2,000  and she has a cut off notice for 11/29. Patient stated she has already reached out to [facility] and have not heard anything back. Patient asked for resources to be sent to her via text message. Patient stated she would also like a new PCP, patient feels as though the office has her best interest at heart. Patient also stated she would like to move to [facility] due to her rent being increased to 845 monthly and she only gets 841 in disability each month.   Pain Interventions: New Goal Pain assessment performed Medications reviewed Reviewed provider established plan for pain management; Discussed importance of adherence to all scheduled medical appointments; Counseled on the importance of reporting any/all new or changed pain symptoms or management strategies to pain management provider; Advised patient to  report to care team affect of pain on daily activities; Reviewed with patient prescribed pharmacological and nonpharmacological pain relief strategies; Screening for signs and symptoms of depression related to chronic disease state;  Assessed social determinant of health barriers;   Skin Issues: Bilateral leg abscesses  New goal. Evaluation of current treatment plan related to  bilateral leg abscesses , Financial constraints related to utilities and Housing barriers self-management and patient's adherence to plan as established by provider. Discussed plans with patient for ongoing care management follow up and provided patient with direct contact information for care management team Evaluation of current treatment plan related to skin issues and patient's adherence to plan as established by provider; Reviewed medications with patient and discussed  ; Social Work referral for financial assistance with utilities and rent, plus patient's request to find a new PCP covered by [employer] Screening for signs and symptoms of depression related to chronic disease state;  Assessed social determinant of health barriers;   Patient Goals/Self-Care Activities: Patient will self administer medications as prescribed Patient will attend all scheduled provider appointments Patient will call pharmacy for medication refills Patient will continue to perform ADL's independently Patient will continue to perform IADL's independently Patient will call provider office for new concerns or questions Patient will work with BSW to address care coordination needs and will continue to work with the clinical team to address health care and disease management related needs.    Follow Up Plan:  Telephone follow up appointment with care management team member scheduled for:  07/14/2021 at 10:00 AM The patient has been provided with contact information for the care management team and has been advised to call  with any health related questions or concerns.  The care management team will reach out to the patient again over the next 21 days.      Follow up:  Patient agrees to Care Plan and Follow-up.  Plan: The Managed Medicaid care management team will reach out to the patient again over the next 4 days.  Date/time of next scheduled Social Work care management/care coordination outreach:  07/02/21 Mickel Fuchs, Arita Miss, [place] Medicaid Team  [phone]

## 2021-07-05 NOTE — Patient Instructions (Signed)
Visit Information  Ms. Labus was given information about Medicaid Managed Care team care coordination services as a part of their [facility] Community Plan Medicaid benefit. [name] Horsfall verbally consented to engagement with the [facility] Managed Care team.   If you are experiencing a medical emergency, please call 911 or report to your local emergency department or urgent care.   If you have a non-emergency medical problem during routine business hours, please contact your provider's office and ask to speak with a nurse.   For questions related to your [facility], please call: [phone] or visit the homepage here: [email or web address]  If you would like to schedule transportation through your [facility], please call the following number at least 2 days in advance of your appointment: [phone].   Call the Behavioral Health Crisis Line at [phone], at any time, 24 hours a day, 7 days a week. If you are in danger or need immediate medical attention call 911.  If you would like help to quit smoking, call 1-800-QUIT-NOW ((479)290-8706) OR Espaol: 1-855-Djelo-Ya (7-782-423-5361) o para ms informacin haga clic aqu or Text READY to 443-154 to register via text  Karina Anderson - following are the goals we discussed in your visit today:   Goals Addressed   None      Social Worker will follow up with patient in 4 days.   Gus Puma, BSW, MHA [facility]  [place]  High Risk Managed Medicaid Team  [phone]   Following is a copy of your plan of care:  Care Plan : RN Care Manager Plan of Care  Updates made by Shaune Leeks since 07/05/2021 12:00 AM     Problem: Chronic Disease Management and Care Coordination Needs for Chronic Pain & Skin Issues   Priority: High     Long-Range Goal: Development of Plan of Care for Chronic Disease  Management and Care Coordination Needs (Chronic Pain and Skin Issues)   Start Date: 06/24/2021  Expected End Date: 10/22/2021  Priority: High  Note:   Current Barriers:  Knowledge Deficits related to plan of care for management of chronic low back pain and skin issues Care Coordination needs related to Financial constraints related to utilities and Housing barriers Chronic Disease Management support and education needs related to chronic low back pain and skin issues. Financial Constraints No Advanced Directives in place  RNCM Clinical Goal(s): New Goals Patient will verbalize understanding of plan for management of chronic low back pain and skin issues. verbalize basic understanding of  chronic low back pain and skin issues disease process and self health management plan   take all medications exactly as prescribed and will call provider for medication related questions attend all scheduled medical appointments: No scheduled future appointments demonstrate Ongoing adherence to prescribed treatment plan for chronic low back pain and skin issues as evidenced by reduced pain on pain scale and improvement of skin issues. demonstrate Ongoing health management independence   continue to work with RN Care Manager to address care management and care coordination needs related to  Chronic low back pain and skin issues work with Child psychotherapist to address  related to the management of Financial constraints related to utilities and Housing barriers related to the management of chronic low back pain and skin issues  through collaboration with Medical illustrator, provider, and care team.   Interventions: Inter-disciplinary care team collaboration (see longitudinal plan of care) Evaluation of current treatment plan related to  self management and patient's adherence to plan as established by provider 07/05/21: BSW contacted patient regarding financial assistance. Patient stated she needs assistance with utility  bill that is $2,000 and she has a cut off notice for 11/29. Patient stated she has already reached out to [facility] and have not heard anything back. Patient asked for resources to be sent to her via text message. Patient stated she would also like a new PCP, patient feels as though the office has her best interest at heart. Patient also stated she would like to move to [facility] due to her rent being increased to 845 monthly and she only gets 841 in disability each month.   Pain Interventions: New Goal Pain assessment performed Medications reviewed Reviewed provider established plan for pain management; Discussed importance of adherence to all scheduled medical appointments; Counseled on the importance of reporting any/all new or changed pain symptoms or management strategies to pain management provider; Advised patient to report to care team affect of pain on daily activities; Reviewed with patient prescribed pharmacological and nonpharmacological pain relief strategies; Screening for signs and symptoms of depression related to chronic disease state;  Assessed social determinant of health barriers;   Skin Issues: Bilateral leg abscesses  New goal. Evaluation of current treatment plan related to  bilateral leg abscesses , Financial constraints related to utilities and Housing barriers self-management and patient's adherence to plan as established by provider. Discussed plans with patient for ongoing care management follow up and provided patient with direct contact information for care management team Evaluation of current treatment plan related to skin issues and patient's adherence to plan as established by provider; Reviewed medications with patient and discussed  ; Social Work referral for financial assistance with utilities and rent, plus patient's request to find a new PCP covered by [employer] Screening for signs and symptoms of depression related to chronic disease  state;  Assessed social determinant of health barriers;   Patient Goals/Self-Care Activities: Patient will self administer medications as prescribed Patient will attend all scheduled provider appointments Patient will call pharmacy for medication refills Patient will continue to perform ADL's independently Patient will continue to perform IADL's independently Patient will call provider office for new concerns or questions Patient will work with BSW to address care coordination needs and will continue to work with the clinical team to address health care and disease management related needs.    Follow Up Plan:  Telephone follow up appointment with care management team member scheduled for:  07/14/2021 at 10:00 AM The patient has been provided with contact information for the care management team and has been advised to call with any health related questions or concerns.  The care management team will reach out to the patient again over the next 21 days.

## 2021-07-09 ENCOUNTER — Other Ambulatory Visit: Payer: Self-pay

## 2021-07-09 NOTE — Patient Instructions (Signed)
Visit Information  [name] was given information about Medicaid Managed Care team care coordination services as a part of their [facility] Community Plan Medicaid benefit. Karina Anderson verbally consented to engagement with the [facility] Managed Care team.   If you are experiencing a medical emergency, please call 911 or report to your local emergency department or urgent care.   If you have a non-emergency medical problem during routine business hours, please contact your provider's office and ask to speak with a nurse.   For questions related to your [facility], please call: [phone] or visit the homepage here: [email or web address]  If you would like to schedule transportation through your [facility], please call the following number at least 2 days in advance of your appointment: [phone].   Call the Behavioral Health Crisis Line at [phone], at any time, 24 hours a day, 7 days a week. If you are in danger or need immediate medical attention call 911.  If you would like help to quit smoking, call 1-800-QUIT-NOW ((941)555-0472) OR Espaol: 1-855-Djelo-Ya (5-885-027-7412) o para ms informacin haga clic aqu or Text READY to 878-676 to register via text  Karina Anderson - following are the goals we discussed in your visit today:   Goals Addressed   None     Social Worker will follow up in 14 days.   Karina Anderson, BSW, MHA [facility]  [place]  High Risk Managed Medicaid Team  [phone]   Following is a copy of your plan of care:  Care Plan : RN Care Manager Plan of Care  Updates made by Shaune Leeks since 07/09/2021 12:00 AM     Problem: Chronic Disease Management and Care Coordination Needs for Chronic Pain & Skin Issues   Priority: High     Long-Range Goal: Development of Plan of Care for Chronic Disease Management and Care  Coordination Needs (Chronic Pain and Skin Issues)   Start Date: 06/24/2021  Expected End Date: 10/22/2021  Priority: High  Note:   Current Barriers:  Knowledge Deficits related to plan of care for management of chronic low back pain and skin issues Care Coordination needs related to Financial constraints related to utilities and Housing barriers Chronic Disease Management support and education needs related to chronic low back pain and skin issues. Financial Constraints No Advanced Directives in place  RNCM Clinical Goal(s): New Goals Patient will verbalize understanding of plan for management of chronic low back pain and skin issues. verbalize basic understanding of  chronic low back pain and skin issues disease process and self health management plan   take all medications exactly as prescribed and will call provider for medication related questions attend all scheduled medical appointments: No scheduled future appointments demonstrate Ongoing adherence to prescribed treatment plan for chronic low back pain and skin issues as evidenced by reduced pain on pain scale and improvement of skin issues. demonstrate Ongoing health management independence   continue to work with RN Care Manager to address care management and care coordination needs related to  Chronic low back pain and skin issues work with Child psychotherapist to address  related to the management of Financial constraints related to utilities and Housing barriers related to the management of chronic low back pain and skin issues  through collaboration with Medical illustrator, provider, and care team.   Interventions: Inter-disciplinary care team collaboration (see longitudinal plan of care) Evaluation of current treatment plan related to  self  management and patient's adherence to plan as established by provider 07/05/21: BSW contacted patient regarding financial assistance. Patient stated she needs assistance with utility bill that is $2,000  and she has a cut off notice for 11/29. Patient stated she has already reached out to [facility] and have not heard anything back. Patient asked for resources to be sent to her via text message. Patient stated she would also like a new PCP, patient feels as though the office has her best interest at heart. Patient also stated she would like to move to [facility] due to her rent being increased to 845 monthly and she only gets 841 in disability each month. 07/09/21: BSW researched resources for patient for loans, [employer] in [place], affordable apartments in [name] and [employer] in [place] [employer] for patient to outreach. Patient stated that DSS did give her a call back and will assist her with $600 for her utility bill.    Pain Interventions: New Goal Pain assessment performed Medications reviewed Reviewed provider established plan for pain management; Discussed importance of adherence to all scheduled medical appointments; Counseled on the importance of reporting any/all new or changed pain symptoms or management strategies to pain management provider; Advised patient to report to care team affect of pain on daily activities; Reviewed with patient prescribed pharmacological and nonpharmacological pain relief strategies; Screening for signs and symptoms of depression related to chronic disease state;  Assessed social determinant of health barriers;   Skin Issues: Bilateral leg abscesses  New goal. Evaluation of current treatment plan related to  bilateral leg abscesses , Financial constraints related to utilities and Housing barriers self-management and patient's adherence to plan as established by provider. Discussed plans with patient for ongoing care management follow up and provided patient with direct contact information for care management team Evaluation of current treatment plan related to skin issues and patient's adherence to plan as established by provider; Reviewed medications with patient and  discussed  ; Social Work referral for financial assistance with utilities and rent, plus patient's request to find a new PCP covered by [employer] Screening for signs and symptoms of depression related to chronic disease state;  Assessed social determinant of health barriers;   Patient Goals/Self-Care Activities: Patient will self administer medications as prescribed Patient will attend all scheduled provider appointments Patient will call pharmacy for medication refills Patient will continue to perform ADL's independently Patient will continue to perform IADL's independently Patient will call provider office for new concerns or questions Patient will work with BSW to address care coordination needs and will continue to work with the clinical team to address health care and disease management related needs.    Follow Up Plan:  Telephone follow up appointment with care management team member scheduled for:  07/14/2021 at 10:00 AM The patient has been provided with contact information for the care management team and has been advised to call with any health related questions or concerns.  The care management team will reach out to the patient again over the next 21 days.

## 2021-07-09 NOTE — Patient Outreach (Signed)
Medicaid Managed Care Social Work Note  07/09/2021 Name:  Karina Anderson MRN:  [id number] DOB:  February 02, 1971  [place] is an 50 y.o. year old female who is Anderson primary patient of Karina Anderson.  The Medicaid Managed Care Coordination team was consulted for assistance with:  Community Resources   [name] was given information about [employer] team services today. [place] Patient agreed to services and verbal consent obtained.  Engaged with patient  for by telephone forfollow up visit in response to referral for case management and/or care coordination services.   Assessments/Interventions:  Review of past medical history, allergies, medications, health status, including review of consultants reports, laboratory and other test data, was performed as part of comprehensive evaluation and provision of chronic care management services.  SDOH: (Social Determinant of Health) assessments and interventions performed: BSW researched resources for patient for loans, Housing Authority in [place], affordable apartments in [place] and [employer] in [place] for patient to outreach. Patient stated that DSS did give her Anderson call back and will assist her with $600 for her utility bill.    Advanced Directives Status:  Not addressed in this encounter.  Care Plan                 Allergies  Allergen Reactions   Citrus Hives   Sulfamethoxazole-Trimethoprim Hives   Butorphanol Hives   Butorphanol Tartrate Hives   Contrast Media [Iodinated Diagnostic Agents] Nausea And Vomiting   Food Other (See Comments)    Allergic to Citrus Fruits.   Ibuprofen    Ketorolac    Lorazepam Hives   Naproxen    Prochlorperazine Hives   Nsaids Rash    Medications Reviewed Today     Reviewed by Karina Anderson (Nurse Practitioner) on 06/29/21 at 1530  Med List Status: <None>   Medication Order Taking? Sig Documenting Provider Last Dose Status Informant  clotrimazole (LOTRIMIN) 1 % cream  [id number] No Apply 1 application topically 2 (two) times daily.  Patient not taking: No sig reported   Karina Anderson Not Taking Active            Med Note [facility], Karina Anderson   Thu Jun 24, 2021  1:06 PM) Using PRN  cyclobenzaprine (FLEXERIL) 10 MG tablet [id number] Yes Take 1 tablet (10 mg total) by mouth 3 (three) times daily as needed for muscle spasms. Karina Anderson Taking Active   doxycycline (ADOXA) 100 MG tablet [id number] Yes Take 1 tablet (100 mg total) by mouth 2 (two) times daily for 7 days. Karina Mare M, PA-C Taking Active   doxycycline (VIBRA-TABS) 100 MG tablet [id number] Yes Take 1 tablet (100 mg total) by mouth 2 (two) times daily. Karina Anderson Taking Active   fluticasone (FLONASE) 50 MCG/ACT nasal spray [id number] No Place 2 sprays into both nostrils daily.  Patient not taking: No sig reported   Karina Anderson Not Taking Active            Med Note [facility], Karina Anderson   Thu Jun 24, 2021  1:07 PM) Using PRN  lidocaine (XYLOCAINE) 2 % solution [id number] Yes Use as directed 15 mLs in the mouth or throat as needed for mouth pain. Apply to painful boils as every 2 hours as needed for pain. Karina Anderson Taking Active   omeprazole (PRILOSEC OTC) 20 MG tablet [id number] Yes Take 20 mg by mouth daily. [provider] Taking Active  phenytoin (DILANTIN) 100 MG ER capsule [id number] Yes TAKE 1 CAPSULE(100 MG) BY MOUTH THREE TIMES DAILY Karina Skiff, Anderson Taking Active             Patient Active Problem List   Diagnosis Date Noted   Folliculitis 06/29/2021   Chronic pain syndrome 04/07/2021   Pharmacologic therapy 04/07/2021   Disorder of skeletal system 04/07/2021   Problems influencing health status 04/07/2021   Arthritis of left knee 04/01/2021   Diarrhea 03/23/2021   Nausea and vomiting 03/23/2021   Impaired fasting glucose 12/31/2020   History of psychosis 12/01/2020   History of suicide attempt 12/01/2020   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   Bone lesion 02/28/2020   Tobacco use disorder 03/13/2016   Cannabis use disorder, moderate, dependence (HCC) 03/13/2016   Bipolar affective disorder, manic, severe, with psychotic behavior (HCC) 03/12/2016   Seizures (HCC) 03/11/2016   Involuntary commitment 03/11/2016   Syphilis, unspecified 08/29/1898    Conditions to be addressed/monitored per PCP order:   community resources  Care Plan : RN Care Manager Plan of Care  Updates made by Shaune Leeks since 07/09/2021 12:00 AM     Problem: Chronic Disease Management and Care Coordination Needs for Chronic Pain & Skin Issues   Priority: High     Long-Range Goal: Development of Plan of Care for Chronic Disease Management and Care Coordination Needs (Chronic Pain and Skin Issues)   Start Date: 06/24/2021  Expected End Date: 10/22/2021  Priority: High  Note:   Current Barriers:  Knowledge Deficits related to plan of care for management of chronic low back pain and skin issues Care Coordination needs related to Financial constraints related to utilities and Housing barriers Chronic Disease Management support and education needs related to chronic low back pain and skin issues. Financial Constraints No Advanced Directives in place  RNCM Clinical Goal(s): New Goals Patient will verbalize understanding of plan for management of chronic low back pain and skin issues. verbalize basic understanding of  chronic low back pain and skin issues disease process and self health management plan   take all medications exactly as prescribed and will call provider for medication related questions attend all scheduled medical appointments: No scheduled future appointments demonstrate Ongoing adherence to prescribed treatment plan for chronic low back pain and skin issues as evidenced by reduced pain on pain scale and improvement of skin issues. demonstrate Ongoing health management  independence   continue to work with RN Care Manager to address care management and care coordination needs related to  Chronic low back pain and skin issues work with Child psychotherapist to address  related to the management of Financial constraints related to utilities and Housing barriers related to the management of chronic low back pain and skin issues  through collaboration with Medical illustrator, provider, and care team.   Interventions: Inter-disciplinary care team collaboration (see longitudinal plan of care) Evaluation of current treatment plan related to  self management and patient's adherence to plan as established by provider 07/05/21: BSW contacted patient regarding financial assistance. Patient stated she needs assistance with utility bill that is $2,000 and she has Anderson cut off notice for 11/29. Patient stated she has already reached out to [facility] and have not heard anything back. Patient asked for resources to be sent to her via text message. Patient stated she would also like Anderson new PCP, patient feels as though the office has her best interest at heart.  Patient also stated she would like to move to [facility] due to her rent being increased to 845 monthly and she only gets 841 in disability each month. 07/09/21: BSW researched resources for patient for loans, [employer] in [place], affordable apartments in [place] and [employer] in [place] for patient to outreach. Patient stated that DSS did give her Anderson call back and will assist her with $600 for her utility bill.    Pain Interventions: New Goal Pain assessment performed Medications reviewed Reviewed provider established plan for pain management; Discussed importance of adherence to all scheduled medical appointments; Counseled on the importance of reporting any/all new or changed pain symptoms or management strategies to pain management provider; Advised patient to report to care team affect of pain on daily activities; Reviewed with patient  prescribed pharmacological and nonpharmacological pain relief strategies; Screening for signs and symptoms of depression related to chronic disease state;  Assessed social determinant of health barriers;   Skin Issues: Bilateral leg abscesses  New goal. Evaluation of current treatment plan related to  bilateral leg abscesses , Financial constraints related to utilities and Housing barriers self-management and patient's adherence to plan as established by provider. Discussed plans with patient for ongoing care management follow up and provided patient with direct contact information for care management team Evaluation of current treatment plan related to skin issues and patient's adherence to plan as established by provider; Reviewed medications with patient and discussed  ; Social Work referral for financial assistance with utilities and rent, plus patient's request to find Anderson new PCP covered by [employer] Screening for signs and symptoms of depression related to chronic disease state;  Assessed social determinant of health barriers;   Patient Goals/Self-Care Activities: Patient will self administer medications as prescribed Patient will attend all scheduled provider appointments Patient will call pharmacy for medication refills Patient will continue to perform ADL's independently Patient will continue to perform IADL's independently Patient will call provider office for new concerns or questions Patient will work with BSW to address care coordination needs and will continue to work with the clinical team to address health care and disease management related needs.    Follow Up Plan:  Telephone follow up appointment with care management team member scheduled for:  07/14/2021 at 10:00 AM The patient has been provided with contact information for the care management team and has been advised to call with any health related questions or concerns.  The care management team will  reach out to the patient again over the next 21 days.      Follow up:  Patient agrees to Care Plan and Follow-up.  Plan: The Managed Medicaid care management team will reach out to the patient again over the next 14 days.  Date/time of next scheduled Social Work care management/care coordination outreach: 07/29/21  Mickel Fuchs, Arita Miss, [place] Medicaid Team  [phone]

## 2021-07-13 ENCOUNTER — Ambulatory Visit: Payer: Self-pay

## 2021-07-13 ENCOUNTER — Ambulatory Visit: Payer: Medicaid Other | Admitting: Nurse Practitioner

## 2021-07-13 NOTE — Telephone Encounter (Signed)
Pt called in stating her abcess has returned and wanted to speak with a nurse about it, no further information was giving   Pt. Reports she "has 2 boils on my thighs, one close to my vagina. There is another coming up under my left under arm. They are very painful." Requests appointment. Appointment made for today.    Reason for Disposition  2 or more boils  Answer Assessment - Initial Assessment Questions 1. APPEARANCE of BOIL: "What does the boil look like?"      One to each thigh 2. LOCATION: "Where is the boil located?"      Thighs, left axilla 3. NUMBER: "How many boils are there?"      2 4. SIZE: "How big is the boil?" (e.g., inches, cm; compare to size of a coin or other object)     Pea-size 5. ONSET: "When did the boil start?"     This week 6. PAIN: "Is there any pain?" If Yes, ask: "How bad is the pain?"   (Scale 1-10; or mild, moderate, severe)     Severe 7. FEVER: "Do you have a fever?" If Yes, ask: "What is it, how was it measured, and when did it start?"      No 8. SOURCE: "Have you been around anyone with boils or other Staph infections?" "Have you ever had boils before?"     No 9. OTHER SYMPTOMS: "Do you have any other symptoms?" (e.g., shaking chills, weakness, rash elsewhere on body)     Pain 10. PREGNANCY: "Is there any chance you are pregnant?" "When was your last menstrual period?"       No  Protocols used: Boil (Skin Abscess)-A-AH

## 2021-07-13 NOTE — Telephone Encounter (Signed)
Noted  

## 2021-07-14 ENCOUNTER — Other Ambulatory Visit: Payer: Self-pay

## 2021-07-14 NOTE — Patient Instructions (Signed)
Visit Information  Karina Anderson was given information about Medicaid Managed Care team care coordination services as a part of their [facility] Community Plan Medicaid benefit. Karina Anderson verbally consented to engagement with the [facility] Managed Care team.   If you are experiencing a medical emergency, please call 911 or report to your local emergency department or urgent care.   If you have a non-emergency medical problem during routine business hours, please contact your provider's office and ask to speak with a nurse.   For questions related to your [facility], please call: [phone] or visit the homepage here: [email or web address]  If you would like to schedule transportation through your [facility], please call the following number at least 2 days in advance of your appointment: [phone].   Call the Behavioral Health Crisis Line at [phone], at any time, 24 hours a day, 7 days a week. If you are in danger or need immediate medical attention call 911.  If you would like help to quit smoking, call 1-800-QUIT-NOW ((605)556-9531) OR Espaol: 1-855-Djelo-Ya (9-450-388-8280) o para ms informacin haga clic aqu or Text READY to 034-917 to register via text  Karina Anderson - following are the goals we discussed in your visit today:   Goals Addressed   None     Please see education materials related to today's visit provided as print materials.   The patient verbalized understanding of instructions provided today and declined a print copy of patient instruction materials.   The Managed Medicaid care management team will reach out to the patient again over the next 30 days.   Karina Norfolk RN, BSN Community Care Coordinator [place]  [facility] Mobile: [phone]   Following is a copy of your plan of care:  Care Plan : RN Care Manager Plan of  Care  Updates made by Leane Call, RN since 07/14/2021 12:00 AM     Problem: Chronic Disease Management and Care Coordination Needs for Chronic Pain & Skin Issues   Priority: High     Long-Range Goal: Development of Plan of Care for Chronic Disease Management and Care Coordination Needs (Chronic Pain and Skin Issues)   Start Date: 06/24/2021  Expected End Date: 10/22/2021  Priority: High  Note:   Current Barriers:  Knowledge Deficits related to plan of care for management of chronic low back pain and skin issues Care Coordination needs related to Financial constraints related to utilities and Housing barriers Chronic Disease Management support and education needs related to chronic low back pain and skin issues. Financial Constraints No Advanced Directives in place  RNCM Clinical Goal(s): New Goals Patient will verbalize understanding of plan for management of chronic low back pain and skin issues. verbalize basic understanding of  chronic low back pain and skin issues disease process and self health management plan   take all medications exactly as prescribed and will call provider for medication related questions attend all scheduled medical appointments: PCP Visit tomorrow 07/15/2021 to address new  demonstrate Ongoing adherence to prescribed treatment plan for chronic low back pain and skin issues as evidenced by reduced pain on pain scale and improvement of skin issues. demonstrate Ongoing health management independence   continue to work with RN Care Manager to address care management and care coordination needs related to  Chronic low back pain and skin issues work with Child psychotherapist to address  related to the management of Financial constraints related to utilities and Housing barriers related  to the management of chronic low back pain and skin issues  through collaboration with RN Care manager, provider, and care team.   Interventions: Inter-disciplinary care team  collaboration (see longitudinal plan of care) Evaluation of current treatment plan related to  self management and patient's adherence to plan as established by provider 07/05/21: BSW contacted patient regarding financial assistance. Patient stated she needs assistance with utility bill that is $2,000 and she has a cut off notice for 11/29. Patient stated she has already reached out to [facility] and have not heard anything back. Patient asked for resources to be sent to her via text message. Patient stated she would also like a new PCP, patient feels as though the office has her best interest at heart. Patient also stated she would like to move to [facility] due to her rent being increased to 845 monthly and she only gets 841 in disability each month. 07/09/21: BSW researched resources for patient for loans, [employer] in [place], affordable apartments in [place] and [employer] in [place] for patient to outreach. Patient stated that DSS did give her a call back and will assist her with $600 for her utility bill.    Pain Interventions: On going Goal (progressing - Not on Track) Pain assessment performed. Patient reports pain "10 to 15" on a pain scale of 1 - 10 due to new bilateral thigh abscesses and one at Left underarm. Medications reviewed Reviewed provider established plan for pain management; Discussed importance of adherence to all scheduled medical appointments; Counseled on the importance of reporting any/all new or changed pain symptoms or management strategies to pain management provider; Advised patient to report to care team affect of pain on daily activities; Reviewed with patient prescribed pharmacological and nonpharmacological pain relief strategies;  Skin Issues: Bilateral leg abscesses  Goal on track:  NO. Evaluation of current treatment plan related to  bilateral leg abscesses , Financial constraints related to utilities and Housing barriers self-management and patient's adherence to  plan as established by provider. Discussed plans with patient for ongoing care management follow up and provided patient with direct contact information for care management team Evaluation of current treatment plan related to skin issues and patient's adherence to plan as established by provider; Reviewed medications with patient and discussed need to discuss with PCP the idea of referral to specialist to address reoccurring abscesses; Social Work referral for financial assistance with utilities and rent, plus patient's request to find a new PCP covered by [employer] Plan to follow up with [facility] BSW regarding status os finding new PCP covered by [employer].  Patient Goals/Self-Care Activities: Patient will self administer medications as prescribed Patient will attend all scheduled provider appointments Patient will call pharmacy for medication refills Patient will continue to perform ADL's independently Patient will continue to perform IADL's independently Patient will call provider office for new concerns or questions Patient will work with BSW to address care coordination needs and will continue to work with the clinical team to address health care and disease management related needs.    Follow Up Plan:  Telephone follow up appointment with care management team member scheduled for:  08/11/2021 at 10:00 AM The patient has been provided with contact information for the care management team and has been advised to call with any health related questions or concerns.  The care management team will reach out to the patient again over the next 30 days.

## 2021-07-14 NOTE — Patient Outreach (Signed)
Medicaid Managed Care   Nurse Care Manager Note  07/14/2021 Name:  Karina Anderson MRN:  [id number] DOB:  [birth year]  [name] is an 49 y.o. year old female who is Anderson primary patient of McElwee, Karina Anderson.  The [facility] Managed Care Coordination team was consulted for assistance with:    Chronic Pain and Skin Issues   [name] was given information about [employer] team services today. [facility] Patient agreed to services and verbal consent obtained.  Engaged with patient by telephone for follow up visit in response to provider referral for case management and/or care coordination services.   Assessments/Interventions:  Review of past medical history, allergies, medications, health status, including review of consultants reports, laboratory and other test data, was performed as part of comprehensive evaluation and provision of chronic care management services.  SDOH (Social Determinants of Health) assessments and interventions performed:   Care Plan  Allergies  Allergen Reactions   Citrus Hives   Sulfamethoxazole-Trimethoprim Hives   Butorphanol Hives   Butorphanol Tartrate Hives   Contrast Media [Iodinated Diagnostic Agents] Nausea And Vomiting   Food Other (See Comments)    Allergic to Citrus Fruits.   Ibuprofen    Ketorolac    Lorazepam Hives   Naproxen    Prochlorperazine Hives   Nsaids Rash    Medications Reviewed Today     Reviewed by Inge Rise, RN (Case Manager) on 07/14/21 at 17  Med List Status: <None>   Medication Order Taking? Sig Documenting Provider Last Dose Status Informant  clotrimazole (LOTRIMIN) 1 % cream [id number] No Apply 1 application topically 2 (two) times daily.  Patient not taking: No sig reported   Charyl Dancer, Anderson Not Taking Active            Med Note [facility], Rett Stehlik Anderson   Thu Jun 24, 2021  1:06 PM) Using PRN  cyclobenzaprine (FLEXERIL) 10 MG tablet [id number] No Take 1 tablet (10 mg total)  by mouth 3 (three) times daily as needed for muscle spasms. McElwee, Karina Anderson Taking Active   doxycycline (VIBRA-TABS) 100 MG tablet [id number] No Take 1 tablet (100 mg total) by mouth 2 (two) times daily. McElwee, Karina Anderson Taking Active   fluticasone (FLONASE) 50 MCG/ACT nasal spray [id number] No Place 2 sprays into both nostrils daily.  Patient not taking: No sig reported   Charyl Dancer, Anderson Not Taking Active            Med Note [facility], Amberlyn Martinezgarcia Anderson   Thu Jun 24, 2021  1:07 PM) Using PRN  lidocaine (XYLOCAINE) 2 % solution [id number] No Use as directed 15 mLs in the mouth or throat as needed for mouth pain. Apply to painful boils as every 2 hours as needed for pain. McElwee, Karina Anderson Taking Active   omeprazole (PRILOSEC OTC) 20 MG tablet [id number] No Take 20 mg by mouth daily. [provider] Taking Active   phenytoin (DILANTIN) 100 MG ER capsule [id number] No TAKE 1 CAPSULE(100 MG) BY MOUTH THREE TIMES DAILY [name], Anderson Taking Active             Patient Active Problem List   Diagnosis Date Noted   Folliculitis [id number]   Chronic pain syndrome 04/07/2021   Pharmacologic therapy 04/07/2021   Disorder of skeletal system 04/07/2021   Problems influencing health status 04/07/2021   Arthritis of left knee 04/01/2021   Diarrhea 03/23/2021   Nausea and vomiting 03/23/2021  Impaired fasting glucose 12/31/2020   History of psychosis 12/01/2020   History of suicide attempt 12/01/2020   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   Bone lesion 02/28/2020   Tobacco use disorder 03/13/2016   Cannabis use disorder, moderate, dependence ([place]) 03/13/2016   Bipolar affective disorder, manic, severe, with psychotic behavior ([place]) 03/12/2016   Seizures ([place]) 03/11/2016   Involuntary commitment 03/11/2016   Syphilis, unspecified 08/29/1898    Conditions to be addressed/monitored per PCP order:   Chronic Pain and Skin Issues  Care Plan :  RN Care Manager Plan of Care  Updates made by Inge Rise, RN since 07/14/2021 12:00 AM     Problem: Chronic Disease Management and Care Coordination Needs for Chronic Pain & Skin Issues   Priority: High     Long-Range Goal: Development of Plan of Care for Chronic Disease Management and Care Coordination Needs (Chronic Pain and Skin Issues)   Start Date: 06/24/2021  Expected End Date: 10/22/2021  Priority: High  Note:   Current Barriers:  Knowledge Deficits related to plan of care for management of chronic low back pain and skin issues Care Coordination needs related to Financial constraints related to utilities and Housing barriers Chronic Disease Management support and education needs related to chronic low back pain and skin issues. Financial Constraints No Advanced Directives in place  RNCM Clinical Goal(s): New Goals Patient will verbalize understanding of plan for management of chronic low back pain and skin issues. verbalize basic understanding of  chronic low back pain and skin issues disease process and self health management plan   take all medications exactly as prescribed and will call provider for medication related questions attend all scheduled medical appointments: PCP Visit tomorrow 07/15/2021 to address new  demonstrate Ongoing adherence to prescribed treatment plan for chronic low back pain and skin issues as evidenced by reduced pain on pain scale and improvement of skin issues. demonstrate Ongoing health management independence   continue to work with RN Care Manager to address care management and care coordination needs related to  Chronic low back pain and skin issues work with Education officer, museum to address  related to the management of Financial constraints related to utilities and Housing barriers related to the management of chronic low back pain and skin issues  through collaboration with Consulting civil engineer, provider, and care team.    Interventions: Inter-disciplinary care team collaboration (see longitudinal plan of care) Evaluation of current treatment plan related to  self management and patient's adherence to plan as established by provider 07/05/21: BSW contacted patient regarding financial assistance. Patient stated she needs assistance with utility bill that is $2,000 and she has Anderson cut off notice for 11/29. Patient stated she has already reached out to [facility] and have not heard anything back. Patient asked for resources to be sent to her via text message. Patient stated she would also like Anderson new PCP, patient feels as though the office has her best interest at heart. Patient also stated she would like to move to [facility] due to her rent being increased to 845 monthly and she only gets 841 in disability each month. 07/09/21: BSW researched resources for patient for loans, [employer] in [place], affordable apartments in [place] and [employer] in [place] for patient to outreach. Patient stated that DSS did give her Anderson call back and will assist her with $600 for her utility bill.    Pain Interventions: On going Goal (progressing - Not on  Track) Pain assessment performed. Patient reports pain "10 to 15" on Anderson pain scale of 1 - 10 due to new bilateral thigh abscesses and one at Left underarm. Medications reviewed Reviewed provider established plan for pain management; Discussed importance of adherence to all scheduled medical appointments; Counseled on the importance of reporting any/all new or changed pain symptoms or management strategies to pain management provider; Advised patient to report to care team affect of pain on daily activities; Reviewed with patient prescribed pharmacological and nonpharmacological pain relief strategies;  Skin Issues: Bilateral leg abscesses  Goal on track:  NO. Evaluation of current treatment plan related to  bilateral leg abscesses , Financial constraints related to utilities and Housing  barriers self-management and patient's adherence to plan as established by provider. Discussed plans with patient for ongoing care management follow up and provided patient with direct contact information for care management team Evaluation of current treatment plan related to skin issues and patient's adherence to plan as established by provider; Reviewed medications with patient and discussed need to discuss with PCP the idea of referral to specialist to address reoccurring abscesses; Social Work referral for financial assistance with utilities and rent, plus patient's request to find Anderson new PCP covered by [employer] Plan to follow up with [facility] BSW regarding status os finding new PCP covered by [employer].  Patient Goals/Self-Care Activities: Patient will self administer medications as prescribed Patient will attend all scheduled provider appointments Patient will call pharmacy for medication refills Patient will continue to perform ADL's independently Patient will continue to perform IADL's independently Patient will call provider office for new concerns or questions Patient will work with BSW to address care coordination needs and will continue to work with the clinical team to address health care and disease management related needs.    Follow Up Plan:  Telephone follow up appointment with care management team member scheduled for:  08/11/2021 at 10:00 AM The patient has been provided with contact information for the care management team and has been advised to call with any health related questions or concerns.  The care management team will reach out to the patient again over the next 30 days.      Follow Up:  Patient agrees to Care Plan and Follow-up.  Plan: The Managed Medicaid care management team will reach out to the patient again over the next 30 days.  Date/time of next scheduled RN care management/care coordination outreach:  08/11/2021 at 10:00  am  [name] RN, BSN [facility]  [facility] Mobile: [phone]

## 2021-07-15 ENCOUNTER — Encounter: Payer: Self-pay | Admitting: Nurse Practitioner

## 2021-07-15 ENCOUNTER — Other Ambulatory Visit: Payer: Self-pay

## 2021-07-15 ENCOUNTER — Ambulatory Visit (INDEPENDENT_AMBULATORY_CARE_PROVIDER_SITE_OTHER): Payer: Medicaid Other | Admitting: Nurse Practitioner

## 2021-07-15 VITALS — BP 123/86 | HR 107 | Temp 98.7°F | Wt 173.5 lb

## 2021-07-15 DIAGNOSIS — L739 Follicular disorder, unspecified: Secondary | ICD-10-CM | POA: Diagnosis not present

## 2021-07-15 MED ORDER — OXYCODONE-ACETAMINOPHEN 10-325 MG PO TABS: 1.0000 | ORAL_TABLET | Freq: Three times a day (TID) | ORAL | 0 refills | Status: AC | PRN

## 2021-07-15 NOTE — Progress Notes (Signed)
Acute Office Visit  Subjective:    Patient ID: Karina Anderson, female    DOB: 1970-10-15, 50 y.o.   MRN: [id number]  Chief Complaint  Patient presents with   Abscess    Abscesses on both thighs.     HPI Patient is in today for recurrent boil on her right thigh and new boil on her left labia. Both are draining and states pain is a 15/10. The spot on her right inner thigh was drained on June 29, 2021. The spot to her left labia is new. She denies any fevers.   She has been having recurrent boils/folliculitis since [birth year]70[birth year]. She has completed several rounds of doxycycline and clindamycin with no relief. She had an area of folliculitis drained on her left thigh on 06/26/21 in the ER.   She has been complaining of severe pain. States that she has been doing warm baths and taking tylenol with no relief. She was given CHG rinse and lidocaine gel to help with the pain and prevent new areas from appearing but states that this does not help her. She declines referral to pain management. She was given one percocet in the ER on 06/26/21 and states this is the only thing that will help with her pain.   Past Medical History:  Diagnosis Date   Chronic lower back pain    Hydradenitis    Seizures ([place])     Past Surgical History:  Procedure Laterality Date   DILATION AND CURETTAGE OF UTERUS     3 miscarriages   HYDRADENITIS EXCISION     R underarm   TUBAL LIGATION     TUMOR REMOVAL  07/2020   index finger on L hand    Family History  Problem Relation Age of Onset   Seizures Mother    Diabetes Mother    Heart attack Father    Hypertension Brother    Eczema Daughter    Anxiety disorder Maternal Grandmother    Hypertension Brother    Hypertension Brother    Hypertension Brother    Hypertension Brother     Social History   Socioeconomic History   Marital status: Single    Spouse name: Not on file   Number of children: Not on file   Years of education: Not on file   Highest  education level: Not on file  Occupational History   Not on file  Tobacco Use   Smoking status: Every Day    Packs/day: 0.50    Types: Cigarettes   Smokeless tobacco: Never   Tobacco comments:    Has smoked since the age of 50 yrs old.  Vaping Use   Vaping Use: Never used  Substance and Sexual Activity   Alcohol use: No    Comment: "Rarely"   Drug use: No   Sexual activity: Not Currently  Other Topics Concern   Not on file  Social History Narrative   Not on file   Social Determinants of Health   Financial Resource Strain: High Risk   Difficulty of Paying Living Expenses: Hard  Food Insecurity: No Food Insecurity   Worried About Running Out of Food in the Last Year: Never true   Ran Out of Food in the Last Year: Never true  Transportation Needs: No Transportation Needs   Lack of Transportation (Medical): No   Lack of Transportation (Non-Medical): No  Physical Activity: Sufficiently Active   Days of Exercise per Week: 3 days   Minutes of Exercise per Session: 60 min  Stress: Stress Concern Present   Feeling of Stress : Rather much  Social Connections: Unknown   Frequency of Communication with Friends and Family: More than three times a week   Frequency of Social Gatherings with Friends and Family: More than three times a week   Attends Religious Services: Not on Electrical engineer or Organizations: Not on file   Attends Archivist Meetings: Not on file   Marital Status: Never married  Intimate Partner Violence: Not on file    Outpatient Medications Prior to Visit  Medication Sig Dispense Refill   clotrimazole (LOTRIMIN) 1 % cream Apply 1 application topically 2 (two) times daily. 30 g 0   doxycycline (VIBRA-TABS) 100 MG tablet Take 1 tablet (100 mg total) by mouth 2 (two) times daily. 20 tablet 0   phenytoin (DILANTIN) 100 MG ER capsule TAKE 1 CAPSULE(100 MG) BY MOUTH THREE TIMES DAILY 90 capsule 2   cyclobenzaprine (FLEXERIL) 10 MG tablet Take 1  tablet (10 mg total) by mouth 3 (three) times daily as needed for muscle spasms. 30 tablet 1   fluticasone (FLONASE) 50 MCG/ACT nasal spray Place 2 sprays into both nostrils daily. (Patient not taking: Reported on 06/24/2021) 16 g 6   lidocaine (XYLOCAINE) 2 % solution Use as directed 15 mLs in the mouth or throat as needed for mouth pain. Apply to painful boils as every 2 hours as needed for pain. (Patient not taking: Reported on 07/15/2021) 100 mL 0   omeprazole (PRILOSEC OTC) 20 MG tablet Take 20 mg by mouth daily. (Patient not taking: Reported on 07/15/2021) 28 tablet 1   No facility-administered medications prior to visit.    Allergies  Allergen Reactions   Citrus Hives   Sulfamethoxazole-Trimethoprim Hives   Butorphanol Hives   Butorphanol Tartrate Hives   Contrast Media [Iodinated Diagnostic Agents] Nausea And Vomiting   Food Other (See Comments)    Allergic to Citrus Fruits.   Ibuprofen    Ketorolac    Lorazepam Hives   Naproxen    Prochlorperazine Hives   Nsaids Rash    Review of Systems  Constitutional: Negative.   Respiratory: Negative.    Cardiovascular: Negative.   Gastrointestinal: Negative.   Genitourinary: Negative.   Skin:        Boil to right thigh and left labia that are draining  Neurological: Negative.       Objective:    Physical Exam Vitals and nursing note reviewed.  Constitutional:      General: She is not in acute distress.    Appearance: Normal appearance.  HENT:     Head: Normocephalic.  Eyes:     Conjunctiva/sclera: Conjunctivae normal.  Cardiovascular:     Rate and Rhythm: Normal rate and regular rhythm.     Pulses: Normal pulses.     Heart sounds: Normal heart sounds.  Pulmonary:     Effort: Pulmonary effort is normal.     Breath sounds: Normal breath sounds.  Musculoskeletal:     Cervical back: Normal range of motion.  Skin:    General: Skin is warm.     Comments: Small flat area of redness to right inner thigh with small  amount of yellow drainage. Small raised area on left labia that is draining sanguinous drainage.   Neurological:     General: No focal deficit present.     Mental Status: She is alert and oriented to person, place, and time.  Psychiatric:  Mood and Affect: Mood normal.        Behavior: Behavior normal.        Thought Content: Thought content normal.        Judgment: Judgment normal.    BP 123/86   Pulse (!) 107   Temp 98.7 F (37.1 C) (Oral)   Wt 173 lb 8 oz (78.7 kg)   SpO2 96%   BMI 28.87 kg/m  Wt Readings from Last 3 Encounters:  07/15/21 173 lb 8 oz (78.7 kg)  06/29/21 173 lb (78.5 kg)  06/26/21 170 lb (77.1 kg)    Health Maintenance Due  Topic Date Due   COVID-19 Vaccine (1) Never done   Pneumococcal Vaccine 41-30 Years old (1 - PCV) Never done    There are no preventive care reminders to display for this patient.   Lab Results  Component Value Date   TSH 1.790 12/31/2020   Lab Results  Component Value Date   WBC 9.4 12/09/2020   HGB 12.4 12/09/2020   HCT 37.0 12/09/2020   MCV 82.4 12/09/2020   PLT 326 12/09/2020   Lab Results  Component Value Date   NA 134 (L) 12/09/2020   K 3.9 12/09/2020   CO2 22 12/09/2020   GLUCOSE 166 (H) 12/09/2020   BUN 16 12/09/2020   CREATININE 0.65 12/09/2020   BILITOT 0.5 12/09/2020   ALKPHOS 68 12/09/2020   AST 18 12/09/2020   ALT 15 12/09/2020   PROT 7.8 12/09/2020   ALBUMIN 3.8 12/09/2020   CALCIUM 9.2 12/09/2020   ANIONGAP 9 12/09/2020   EGFR 94 12/01/2020   Lab Results  Component Value Date   CHOL 249 (H) 12/31/2020   Lab Results  Component Value Date   HDL 49 12/31/2020   Lab Results  Component Value Date   LDLCALC 158 (H) 12/31/2020   Lab Results  Component Value Date   TRIG 228 (H) 12/31/2020   Lab Results  Component Value Date   CHOLHDL 4.7 03/12/2016   Lab Results  Component Value Date   HGBA1C 5.4 12/31/2020       Assessment & Plan:   Problem List Items Addressed This Visit        Musculoskeletal and Integument   Folliculitis - Primary    Ongoing drainage from recurrent boils/folliculitis. She has taken several rounds of doxycyline and clindamycin with no relief. She states that CHG makes these areas worse and not better. Will place referral to surgery to evaluate recurring folliculitis. Continue warm compresses. Will give 5 days of percocet for pain until evaluation by surgeon. Discussed that this would be one time prescription. PDMP reviewed. Will reach out to care management to see if we can get help with transportation to this appointment with surgery on 07/20/21 at 3pm. Encouraged her to reach out to friends/family to help with transportation to this appointment.       Relevant Orders   Ambulatory referral to General Surgery     Meds ordered this encounter  Medications   oxyCODONE-acetaminophen (PERCOCET) 10-325 MG tablet    Sig: Take 1 tablet by mouth every 8 (eight) hours as needed for up to 5 days for pain.    Dispense:  15 tablet    Refill:  0     Charyl Dancer, NP

## 2021-07-15 NOTE — Patient Instructions (Addendum)
07/20/21 at 3pm:  [address] [place] [address] Phone: [phone]  We are going to call and see if we can help with transportation

## 2021-07-15 NOTE — Assessment & Plan Note (Signed)
Ongoing drainage from recurrent boils/folliculitis. She has taken several rounds of doxycyline and clindamycin with no relief. She states that CHG makes these areas worse and not better. Will place referral to surgery to evaluate recurring folliculitis. Continue warm compresses. Will give 5 days of percocet for pain until evaluation by surgeon. Discussed that this would be one time prescription. PDMP reviewed. Will reach out to care management to see if we can get help with transportation to this appointment with surgery on 07/20/21 at 3pm. Encouraged her to reach out to friends/family to help with transportation to this appointment.

## 2021-07-20 ENCOUNTER — Ambulatory Visit: Payer: Medicaid Other | Admitting: Surgery

## 2021-07-29 ENCOUNTER — Other Ambulatory Visit: Payer: Self-pay

## 2021-07-29 NOTE — Patient Instructions (Signed)
Visit Information  Ms. Culliton was given information about Medicaid Managed Care team care coordination services as a part of their [facility] Community Plan Medicaid benefit. [name] Murton verbally consented to engagement with the [facility] Managed Care team.   If you are experiencing a medical emergency, please call 911 or report to your local emergency department or urgent care.   If you have a non-emergency medical problem during routine business hours, please contact your provider's office and ask to speak with a nurse.   For questions related to your [facility], please call: [phone] or visit the homepage here: [email or web address]  If you would like to schedule transportation through your [facility], please call the following number at least 2 days in advance of your appointment: [phone].   Call the Behavioral Health Crisis Line at [phone], at any time, 24 hours a day, 7 days a week. If you are in danger or need immediate medical attention call 911.  If you would like help to quit smoking, call 1-800-QUIT-NOW ((779) 822-7818) OR Espaol: 1-855-Djelo-Ya (7-322-025-4270) o para ms informacin haga clic aqu or Text READY to 623-762 to register via text  Ms. Tuckett - following are the goals we discussed in your visit today:   Goals Addressed   None       Social Worker will follow up with patient in 30 days.   Gus Puma, BSW, MHA [facility]  [place]  High Risk Managed Medicaid Team  [phone]   Following is a copy of your plan of care:  Care Plan : RN Care Manager Plan of Care  Updates made by Shaune Leeks since 07/29/2021 12:00 AM     Problem: Chronic Disease Management and Care Coordination Needs for Chronic Pain & Skin Issues   Priority: High     Long-Range Goal: Development of Plan of Care for Chronic Disease  Management and Care Coordination Needs (Chronic Pain and Skin Issues)   Start Date: 06/24/2021  Expected End Date: 10/22/2021  Priority: High  Note:   Current Barriers:  Knowledge Deficits related to plan of care for management of chronic low back pain and skin issues Care Coordination needs related to Financial constraints related to utilities and Housing barriers Chronic Disease Management support and education needs related to chronic low back pain and skin issues. Financial Constraints No Advanced Directives in place  RNCM Clinical Goal(s): New Goals Patient will verbalize understanding of plan for management of chronic low back pain and skin issues. verbalize basic understanding of  chronic low back pain and skin issues disease process and self health management plan   take all medications exactly as prescribed and will call provider for medication related questions attend all scheduled medical appointments: PCP Visit tomorrow 07/15/2021 to address new  demonstrate Ongoing adherence to prescribed treatment plan for chronic low back pain and skin issues as evidenced by reduced pain on pain scale and improvement of skin issues. demonstrate Ongoing health management independence   continue to work with RN Care Manager to address care management and care coordination needs related to  Chronic low back pain and skin issues work with Child psychotherapist to address  related to the management of Financial constraints related to utilities and Housing barriers related to the management of chronic low back pain and skin issues  through collaboration with Medical illustrator, provider, and care team.   Interventions: Inter-disciplinary care team collaboration (see longitudinal plan of care) Evaluation  of current treatment plan related to  self management and patient's adherence to plan as established by provider 07/05/21: BSW contacted patient regarding financial assistance. Patient stated she needs assistance  with utility bill that is $2,000 and she has a cut off notice for 11/29. Patient stated she has already reached out to [facility] and have not heard anything back. Patient asked for resources to be sent to her via text message. Patient stated she would also like a new PCP, patient feels as though the office has her best interest at heart. Patient also stated she would like to move to [facility] due to her rent being increased to 845 monthly and she only gets 841 in disability each month. 07/09/21: BSW researched resources for patient for loans, [employer] in [place], affordable apartments in [place] and [employer] in [place] for patient to outreach. Patient stated that DSS did give her a call back and will assist her with $600 for her utility bill.  07/29/21: Patient stated she still wants to go to a new doctor because she is still in pain. Patient states she has called Psychologist, occupational in [place] and in [place] and they are not taking applications. Patient stated there is a govenerment program that assists with rent and she would give them a call. BSW asked patient about transportation to her appointment on 12/6 she stated she does not have transportation, BSW contacted [employer] to schedule transportation. [employer]'s transportation confirmation number is [phone] and they will be to pick patient up at 1:00pm. BSW contacted patient and informed her of pick up time.   Pain Interventions: On going Goal (progressing - Not on Track) Pain assessment performed. Patient reports pain "10 to 15" on a pain scale of 1 - 10 due to new bilateral thigh abscesses and one at Left underarm. Medications reviewed Reviewed provider established plan for pain management; Discussed importance of adherence to all scheduled medical appointments; Counseled on the importance of reporting any/all new or changed pain symptoms or management strategies to pain management provider; Advised patient to report to care team affect  of pain on daily activities; Reviewed with patient prescribed pharmacological and nonpharmacological pain relief strategies;  Skin Issues: Bilateral leg abscesses  Goal on track:  NO. Evaluation of current treatment plan related to  bilateral leg abscesses , Financial constraints related to utilities and Housing barriers self-management and patient's adherence to plan as established by provider. Discussed plans with patient for ongoing care management follow up and provided patient with direct contact information for care management team Evaluation of current treatment plan related to skin issues and patient's adherence to plan as established by provider; Reviewed medications with patient and discussed need to discuss with PCP the idea of referral to specialist to address reoccurring abscesses; Social Work referral for financial assistance with utilities and rent, plus patient's request to find a new PCP covered by [employer] Plan to follow up with [facility] BSW regarding status os finding new PCP covered by [employer].  Patient Goals/Self-Care Activities: Patient will self administer medications as prescribed Patient will attend all scheduled provider appointments Patient will call pharmacy for medication refills Patient will continue to perform ADL's independently Patient will continue to perform IADL's independently Patient will call provider office for new concerns or questions Patient will work with BSW to address care coordination needs and will continue to work with the clinical team to address health care and disease management related needs.    Follow Up  Plan:  Telephone follow up appointment with care management team member scheduled for:  08/11/2021 at 10:00 AM The patient has been provided with contact information for the care management team and has been advised to call with any health related questions or concerns.  The care management team will reach  out to the patient again over the next 30 days.

## 2021-07-29 NOTE — Patient Outreach (Signed)
Medicaid Managed Care Social Work Note  07/29/2021 Name:  Karina Anderson MRN:  [id number] DOB:  1971-04-14  [place] is an 50 y.o. year old female who is Anderson primary patient of Anderson, Karina A, NP.  The [facility] Managed Care Coordination team was consulted for assistance with:  Transportation Needs   [name] was given information about [employer] team services today. [place] Patient agreed to services and verbal consent obtained.  Engaged with patient  for by telephone forfollow up visit in response to referral for case management and/or care coordination services.   Assessments/Interventions:  Review of past medical history, allergies, medications, health status, including review of consultants reports, laboratory and other test data, was performed as part of comprehensive evaluation and provision of chronic care management services.  SDOH: (Social Determinant of Health) assessments and interventions performed: Patient stated she still wants to go to Anderson new doctor because she is still in pain. Patient states she has called Heritage manager in [place] and in [place] and they are not taking applications. Patient stated there is Anderson govenerment program that assists with rent and she would give them Anderson call. BSW asked patient about transportation to her appointment on 12/6 she stated she does not have transportation, BSW contacted [employer] to schedule transportation. [employer]'s transportation confirmation number is [phone] and they will be to pick patient up at 1:00pm. BSW contacted patient and informed her of pick up time.   Advanced Directives Status:  Not addressed in this encounter.  Care Plan                 Allergies  Allergen Reactions   Citrus Hives   Sulfamethoxazole-Trimethoprim Hives   Butorphanol Hives   Butorphanol Tartrate Hives   Contrast Media [Iodinated Diagnostic Agents] Nausea And Vomiting   Food Other (See Comments)     Allergic to Citrus Fruits.   Ibuprofen    Ketorolac    Lorazepam Hives   Naproxen    Prochlorperazine Hives   Nsaids Rash    Medications Reviewed Today     Reviewed by Karina Dancer, NP (Nurse Practitioner) on 07/15/21 at [place] List Status: <None>   Medication Order Taking? Sig Documenting Provider Last Dose Status Informant  clotrimazole (LOTRIMIN) 1 % cream [id number] Yes Apply 1 application topically 2 (two) times daily. Karina Dancer, NP Taking Active            Med Note [facility] Anderson, Karina Anderson   Thu Jun 24, 2021  1:06 PM) Using PRN  cyclobenzaprine (FLEXERIL) 10 MG tablet [id number]  Take 1 tablet (10 mg total) by mouth 3 (three) times daily as needed for muscle spasms. Anderson, Karina A, NP  Consider Medication Status and Discontinue (Change in therapy)   doxycycline (VIBRA-TABS) 100 MG tablet [id number] Yes Take 1 tablet (100 mg total) by mouth 2 (two) times daily. Anderson, Karina A, NP Taking Active   fluticasone (FLONASE) 50 MCG/ACT nasal spray [id number] No Place 2 sprays into both nostrils daily.  Patient not taking: Reported on 06/24/2021   Karina Dancer, NP Not Taking Active            Med Note [facility] Anderson, Karina Anderson   Thu Jun 24, 2021  1:07 PM) Using PRN  lidocaine (XYLOCAINE) 2 % solution [id number] No Use as directed 15 mLs in the mouth or throat as needed for mouth pain. Apply to painful boils as every 2 hours as needed for pain.  Patient  not taking: Reported on 07/15/2021   Karina Dancer, NP Not Taking Active   omeprazole (PRILOSEC OTC) 20 MG tablet [id number] No Take 20 mg by mouth daily.  Patient not taking: Reported on 07/15/2021   [provider] Not Taking Active   oxyCODONE-acetaminophen (PERCOCET) 10-325 MG tablet [id number] Yes Take 1 tablet by mouth every 8 (eight) hours as needed for up to 5 days for pain. Karina Dancer, NP  Active   phenytoin (DILANTIN) 100 MG ER capsule [id number] Yes TAKE 1 CAPSULE(100 MG) BY MOUTH THREE TIMES DAILY  Karina Lick, NP Taking Active             Patient Active Problem List   Diagnosis Date Noted   Folliculitis [id number]   Chronic pain syndrome 04/07/2021   Pharmacologic therapy 04/07/2021   Disorder of skeletal system 04/07/2021   Problems influencing health status 04/07/2021   Arthritis of left knee 04/01/2021   Diarrhea 03/23/2021   Nausea and vomiting 03/23/2021   Impaired fasting glucose 12/31/2020   History of psychosis 12/01/2020   History of suicide attempt 12/01/2020   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   Bone lesion 02/28/2020   Tobacco use disorder 03/13/2016   Cannabis use disorder, moderate, dependence ([place]) 03/13/2016   Bipolar affective disorder, manic, severe, with psychotic behavior ([place]) 03/12/2016   Seizures ([place]) 03/11/2016   Involuntary commitment 03/11/2016   Syphilis, unspecified 08/29/1898    Conditions to be addressed/monitored per PCP order:   transportation  Care Plan : RN Care Manager Plan of Care  Updates made by Ethelda Chick since 07/29/2021 12:00 AM     Problem: Chronic Disease Management and Care Coordination Needs for Chronic Pain & Skin Issues   Priority: High     Long-Range Goal: Development of Plan of Care for Chronic Disease Management and Care Coordination Needs (Chronic Pain and Skin Issues)   Start Date: 06/24/2021  Expected End Date: 10/22/2021  Priority: High  Note:   Current Barriers:  Knowledge Deficits related to plan of care for management of chronic low back pain and skin issues Care Coordination needs related to Financial constraints related to utilities and Housing barriers Chronic Disease Management support and education needs related to chronic low back pain and skin issues. Financial Constraints No Advanced Directives in place  RNCM Clinical Goal(s): New Goals Patient will verbalize understanding of plan for management of chronic low back pain and skin issues. verbalize  basic understanding of  chronic low back pain and skin issues disease process and self health management plan   take all medications exactly as prescribed and will call provider for medication related questions attend all scheduled medical appointments: PCP Visit tomorrow 07/15/2021 to address new  demonstrate Ongoing adherence to prescribed treatment plan for chronic low back pain and skin issues as evidenced by reduced pain on pain scale and improvement of skin issues. demonstrate Ongoing health management independence   continue to work with RN Care Manager to address care management and care coordination needs related to  Chronic low back pain and skin issues work with Education officer, museum to address  related to the management of Financial constraints related to utilities and Housing barriers related to the management of chronic low back pain and skin issues  through collaboration with Consulting civil engineer, provider, and care team.   Interventions: Inter-disciplinary care team collaboration (see longitudinal plan of care) Evaluation of current treatment plan related to  self management and patient's adherence  to plan as established by provider 07/05/21: BSW contacted patient regarding financial assistance. Patient stated she needs assistance with utility bill that is $2,000 and she has Anderson cut off notice for 11/29. Patient stated she has already reached out to [facility] and have not heard anything back. Patient asked for resources to be sent to her via text message. Patient stated she would also like Anderson new PCP, patient feels as though the office has her best interest at heart. Patient also stated she would like to move to [facility] due to her rent being increased to 845 monthly and she only gets 841 in disability each month. 07/09/21: BSW researched resources for patient for loans, [employer] in [place], affordable apartments in [place] and [employer] in [place] for patient to outreach. Patient stated that DSS did  give her Anderson call back and will assist her with $600 for her utility bill.  07/29/21: Patient stated she still wants to go to Anderson new doctor because she is still in pain. Patient states she has called Heritage manager in [place] and in [place] and they are not taking applications. Patient stated there is Anderson govenerment program that assists with rent and she would give them Anderson call. BSW asked patient about transportation to her appointment on 12/6 she stated she does not have transportation, BSW contacted [employer] to schedule transportation. [employer]'s transportation confirmation number is [phone] and they will be to pick patient up at 1:00pm. BSW contacted patient and informed her of pick up time.   Pain Interventions: On going Goal (progressing - Not on Track) Pain assessment performed. Patient reports pain "10 to 15" on Anderson pain scale of 1 - 10 due to new bilateral thigh abscesses and one at Left underarm. Medications reviewed Reviewed provider established plan for pain management; Discussed importance of adherence to all scheduled medical appointments; Counseled on the importance of reporting any/all new or changed pain symptoms or management strategies to pain management provider; Advised patient to report to care team affect of pain on daily activities; Reviewed with patient prescribed pharmacological and nonpharmacological pain relief strategies;  Skin Issues: Bilateral leg abscesses  Goal on track:  NO. Evaluation of current treatment plan related to  bilateral leg abscesses , Financial constraints related to utilities and Housing barriers self-management and patient's adherence to plan as established by provider. Discussed plans with patient for ongoing care management follow up and provided patient with direct contact information for care management team Evaluation of current treatment plan related to skin issues and patient's adherence to plan as established by provider; Reviewed  medications with patient and discussed need to discuss with PCP the idea of referral to specialist to address reoccurring abscesses; Social Work referral for financial assistance with utilities and rent, plus patient's request to find Anderson new PCP covered by [employer] Plan to follow up with [facility] BSW regarding status os finding new PCP covered by [employer].  Patient Goals/Self-Care Activities: Patient will self administer medications as prescribed Patient will attend all scheduled provider appointments Patient will call pharmacy for medication refills Patient will continue to perform ADL's independently Patient will continue to perform IADL's independently Patient will call provider office for new concerns or questions Patient will work with BSW to address care coordination needs and will continue to work with the clinical team to address health care and disease management related needs.    Follow Up Plan:  Telephone follow up appointment with care management team member scheduled  for:  08/11/2021 at 10:00 AM The patient has been provided with contact information for the care management team and has been advised to call with any health related questions or concerns.  The care management team will reach out to the patient again over the next 30 days.      Follow up:  Patient agrees to Care Plan and Follow-up.  Plan: The Managed Medicaid care management team will reach out to the patient again over the next 30 days.  Date/time of next scheduled Social Work care management/care coordination outreach:  08/31/20  Gus Puma, Kenard Gower, [facility]  [facility]  High Risk Managed Medicaid Team  [phone]

## 2021-08-03 ENCOUNTER — Ambulatory Visit: Payer: Medicaid Other | Admitting: Surgery

## 2021-08-11 ENCOUNTER — Other Ambulatory Visit: Payer: Self-pay

## 2021-08-11 NOTE — Patient Outreach (Signed)
Medicaid Managed Care   Nurse Care Manager Note  08/11/2021 Name:  Karina Anderson MRN:  [id number] DOB:  [birth year]  Karina Anderson is an 50 y.o. year old female who is a primary patient of Anderson, Karina A, NP.  The [facility] Managed Care Coordination team was consulted for assistance with:    Chronic Back Pain and Skin Issues  [name] was given information about [employer] team services today. [name] agreed to services and verbal consent obtained.  Engaged with patient by telephone for follow up visit in response to provider referral for case management and/or care coordination services.   Assessments/Interventions:  Review of past medical history, allergies, medications, health status, including review of consultants reports, laboratory and other test data, was performed as part of comprehensive evaluation and provision of chronic care management services.  SDOH (Social Determinants of Health) assessments and interventions performed:   Care Plan  Allergies  Allergen Reactions   Citrus Hives   Sulfamethoxazole-Trimethoprim Hives   Butorphanol Hives   Butorphanol Tartrate Hives   Contrast Media [Iodinated Diagnostic Agents] Nausea And Vomiting   Food Other (See Comments)    Allergic to Citrus Fruits.   Ibuprofen    Ketorolac    Lorazepam Hives   Naproxen    Prochlorperazine Hives   Nsaids Rash    Medications Reviewed Today     Reviewed by Karina Rise, RN (Case Manager) on 08/11/21 at [place] List Status: <None>   Medication Order Taking? Sig Documenting Provider Last Dose Status Informant  clotrimazole (LOTRIMIN) 1 % cream [id number] No Apply 1 application topically 2 (two) times daily. Karina Dancer, NP Taking Active            Med Note [facility], Girtrude Enslin A   Thu Jun 24, 2021  1:06 PM) Using PRN  cyclobenzaprine (FLEXERIL) 10 MG tablet [id number] No Take 1 tablet (10 mg total) by mouth 3 (three) times daily as needed  for muscle spasms. Anderson, Karina A, NP Taking Active   doxycycline (VIBRA-TABS) 100 MG tablet [id number] No Take 1 tablet (100 mg total) by mouth 2 (two) times daily. Anderson, Karina A, NP Taking Active   fluticasone (FLONASE) 50 MCG/ACT nasal spray [id number] No Place 2 sprays into both nostrils daily.  Patient not taking: Reported on 06/24/2021   Karina Dancer, NP Not Taking Active            Med Note [facility], Karina Anderson A   Thu Jun 24, 2021  1:07 PM) Using PRN  lidocaine (XYLOCAINE) 2 % solution [id number] No Use as directed 15 mLs in the mouth or throat as needed for mouth pain. Apply to painful boils as every 2 hours as needed for pain.  Patient not taking: Reported on 07/15/2021   Karina Dancer, NP Not Taking Active   omeprazole (PRILOSEC OTC) 20 MG tablet [id number] No Take 20 mg by mouth daily.  Patient not taking: Reported on 07/15/2021   [provider] Not Taking Active   phenytoin (DILANTIN) 100 MG ER capsule [id number] No TAKE 1 CAPSULE(100 MG) BY MOUTH THREE TIMES DAILY Karina Lick, NP Taking Active             Patient Active Problem List   Diagnosis Date Noted   Folliculitis [id number]   Chronic pain syndrome 04/07/2021   Pharmacologic therapy 04/07/2021   Disorder of skeletal system 04/07/2021   Problems influencing health status 04/07/2021   Arthritis of left knee 04/01/2021  Diarrhea 03/23/2021   Nausea and vomiting 03/23/2021   Impaired fasting glucose 12/31/2020   History of psychosis 12/01/2020   History of suicide attempt 12/01/2020   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   Bone lesion 02/28/2020   Tobacco use disorder 03/13/2016   Cannabis use disorder, moderate, dependence ([place]) 03/13/2016   Bipolar affective disorder, manic, severe, with psychotic behavior ([place]) 03/12/2016   Seizures ([place]) 03/11/2016   Involuntary commitment 03/11/2016   Syphilis, unspecified 08/29/1898    Conditions to be  addressed/monitored per PCP order:   Chronic Back Pain and Skin Issues  Care Plan : RN Care Manager Plan of Care  Updates made by Karina Rise, RN since 08/11/2021 12:00 AM     Problem: Chronic Disease Management and Care Coordination Needs for Chronic Pain & Skin Issues   Priority: High     Long-Range Goal: Development of Plan of Care for Chronic Disease Management and Care Coordination Needs (Chronic Pain and Skin Issues)   Start Date: 06/24/2021  Expected End Date: 10/22/2021  Priority: High  Note:   Current Barriers:  Knowledge Deficits related to plan of care for management of chronic low back pain and skin issues Care Coordination needs related to Financial constraints related to utilities and Housing barriers Chronic Disease Management support and education needs related to chronic low back pain and skin issues. Financial Constraints No Advanced Directives in place  RNCM Clinical Goal(s): New Goals Patient will verbalize understanding of plan for management of chronic low back pain and skin issues. 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 demonstrate Ongoing adherence to prescribed treatment plan for chronic low back pain and skin issues as evidenced by reduced pain on pain scale and improvement of skin issues. demonstrate Ongoing health management independence   continue to work with RN Care Manager to address care management and care coordination needs related to  Chronic low back pain and skin issues work with Education officer, museum to address  related to the management of Financial constraints related to utilities and Housing barriers related to the management of chronic  low back pain and skin issues  through collaboration with Consulting civil engineer, provider, and care team.   Interventions: Inter-disciplinary care team collaboration (see longitudinal plan of care) Evaluation of current treatment plan related to  self management and patient's adherence to plan as established by provider 07/05/21: BSW contacted patient regarding financial assistance. Patient stated she needs assistance with utility bill that is $2,000 and she has a cut off notice for 11/29. Patient stated she has already reached out to [employer] [place] [employer] and have not heard anything back. Patient asked for resources to be sent to her via text message. Patient stated she would also like a new PCP, patient feels as though the office has her best interest at heart. Patient also stated she would like to move to [facility] due to her rent being increased to 845 monthly and she only gets 841 in disability each month. 07/09/21: BSW researched resources for patient for loans, [employer] in [place], affordable apartments in [place] and [employer] in [place] for patient to outreach. Patient stated that DSS did give her a call back and will assist her with $600 for  her utility bill.  07/29/21: Patient stated she still wants to go to a new doctor because she is still in pain. Patient states she has called Heritage manager in [place] and in [place] and they are not taking applications. Patient stated there is a govenerment program that assists with rent and she would give them a call. BSW asked patient about transportation to her appointment on 12/6 she stated she does not have transportation, BSW contacted [employer] to schedule transportation. [employer]'s transportation confirmation number is [phone] and they will be to pick patient up at 1:00pm. BSW contacted patient and informed her of pick up time.   Pain Interventions: On going Goal (progressing - Not on Track) Pain assessment performed. Patient reports pain "10" on a  pain scale of 1 - 10 due to chronic lower back pain new bilateral thigh abscesses and one at Left underarm. Medications reviewed Reviewed provider established plan for pain management; Discussed importance of adherence to all scheduled medical appointments; Counseled on the importance of reporting any/all new or changed pain symptoms or management strategies to pain management provider; Advised patient to report to care team affect of pain on daily activities; Reviewed with patient prescribed pharmacological and nonpharmacological pain relief strategies; such as use of her new heating blanket along with use of hot baths with Epsom Salt.  Skin Issues: Bilateral leg abscesses  Goal on track:  NO. Evaluation of current treatment plan related to  bilateral leg abscesses ,  self-management and patient's adherence to plan as established by provider.  Patient reports there are no new abscesses that have developed.  Old abscesses remain about the same.  Patient plans on attending new patient visit with surgeon on 08/17/2021 to address recurrent abscesses. Discussed plans with patient for ongoing care management follow up and provided patient with direct contact information for care management team Evaluation of current treatment plan related to skin issues and patient's adherence to plan as established by provider; Reviewed medications with patient and discussed need to discuss with PCP the idea of referral to specialist to address reoccurring abscesses; Social Work referral for financial assistance with utilities and rent, plus patient's request to find a new PCP covered by [employer] Plan to follow up with [facility] BSW regarding status os finding new PCP covered by [employer].  Patient Goals/Self-Care Activities: Patient will self administer medications as prescribed Patient will attend all scheduled provider appointments Patient will call pharmacy for medication  refills Patient will continue to perform ADL's independently Patient will continue to perform IADL's independently Patient will call provider office for new concerns or questions Patient will work with BSW to address care coordination needs and will continue to work with the clinical team to address health care and disease management related needs.    Follow Up Plan:  Telephone follow up appointment with care management team member scheduled for:  09/08/2021 at 10:00 AM The patient has been provided with contact information for the care management team and has been advised to call with any health related questions or concerns.  The care management team will reach out to the patient again over the next 30 days.      Follow Up:  Patient agrees to Care Plan and Follow-up.  Plan: The Managed Medicaid care management team will reach out to the patient again over the next 30 days.  Date/time of next scheduled RN care management/care coordination outreach:  09/08/2021 at 10:00 AM  Jim Hogg, [place]  Coordinator [facility]   [facility] Mobile: [phone]

## 2021-08-11 NOTE — Patient Instructions (Signed)
Visit Information  Karina Anderson was given information about Medicaid Managed Care team care coordination services as a part of their [facility] Plan Medicaid benefit. Karina Anderson verbally consented to engagement with the [facility] Managed Care team.   If you are experiencing a medical emergency, please call 911 or report to your local emergency department or urgent care.   If you have a non-emergency medical problem during routine business hours, please contact your provider's office and ask to speak with a nurse.   For questions related to your [facility], please call: [phone] or visit the homepage here: [email or web address]  If you would like to schedule transportation through your [facility], please call the following number at least 2 days in advance of your appointment: [phone].   Call the Behavioral Health Crisis Line at [phone], at any time, 24 hours a day, 7 days a week. If you are in danger or need immediate medical attention call 911.  If you would like help to quit smoking, call 1-800-QUIT-NOW (878-876-3974) OR Espaol: 1-855-Djelo-Ya (1-749-449-6759) o para ms informacin haga clic aqu or Text READY to 163-846 to register via text  Karina Anderson - following are the goals we discussed in your visit today:  Goals Addressed:  Patient Goals addressed today are listed under RN Care Manager Plan of Care below.  The patient verbalized understanding of instructions provided today and declined a print copy of patient instruction materials.   The Managed Medicaid care management team will reach out to the patient again over the next 30 days.   Karina Norfolk RN, BSN Community Care Coordinator [place]   [facility] Mobile: [phone]   Following is a copy of your plan of care:  Care Plan : RN Care Manager Plan of Care  Updates  made by Leane Call, RN since 08/11/2021 12:00 AM     Problem: Chronic Disease Management and Care Coordination Needs for Chronic Pain & Skin Issues   Priority: High     Long-Range Goal: Development of Plan of Care for Chronic Disease Management and Care Coordination Needs (Chronic Pain and Skin Issues)   Start Date: 06/24/2021  Expected End Date: 10/22/2021  Priority: High  Note:   Current Barriers:  Knowledge Deficits related to plan of care for management of chronic low back pain and skin issues Care Coordination needs related to Financial constraints related to utilities and Housing barriers Chronic Disease Management support and education needs related to chronic low back pain and skin issues. Financial Constraints No Advanced Directives in place  RNCM Clinical Goal(s): New Goals Patient will verbalize understanding of plan for management of chronic low back pain and skin issues. 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 demonstrate Ongoing adherence to prescribed treatment plan for chronic low back pain and skin issues as evidenced by reduced pain on pain scale and improvement of skin issues. demonstrate Ongoing health management independence   continue to work with RN Care Manager to address care management and care coordination needs related to  Chronic low back pain and skin issues work with Child psychotherapist to address  related to the management of Financial constraints related to utilities and Housing barriers related to  the management of chronic low back pain and skin issues  through collaboration with RN Care manager, provider, and care team.    Interventions: Inter-disciplinary care team collaboration (see longitudinal plan of care) Evaluation of current treatment plan related to  self management and patient's adherence to plan as established by provider 07/05/21: BSW contacted patient regarding financial assistance. Patient stated she needs assistance with utility bill that is $2,000 and she has a cut off notice for 11/29. Patient stated she has already reached out to [facility] and have not heard anything back. Patient asked for resources to be sent to her via text message. Patient stated she would also like a new PCP, patient feels as though the office has her best interest at heart. Patient also stated she would like to move to [facility] due to her rent being increased to 845 monthly and she only gets 841 in disability each month. 07/09/21: BSW researched resources for patient for loans, [employer] in [place], affordable apartments in [place] and [employer] in [place] for patient to outreach. Patient stated that DSS did give her a call back and will assist her with $600 for her utility bill.  07/29/21: Patient stated she still wants to go to a new doctor because she is still in pain. Patient states she has called Psychologist, occupational in [place] and in [place] and they are not taking applications. Patient stated there is a govenerment program that assists with rent and she would give them a call. BSW asked patient about transportation to her appointment on 12/6 she stated she does not have transportation, BSW contacted [employer] to schedule transportation. [employer]'s transportation confirmation number is [id number] and they will be to pick patient up at 1:00pm. BSW contacted patient and informed her of pick up time.   Pain Interventions: On going Goal (progressing - Not on Track) Pain assessment performed. Patient reports pain "10" on a pain scale of 1 - 10 due to chronic lower back pain new bilateral thigh abscesses and one at Left  underarm. Medications reviewed Reviewed provider established plan for pain management; Discussed importance of adherence to all scheduled medical appointments; Counseled on the importance of reporting any/all new or changed pain symptoms or management strategies to pain management provider; Advised patient to report to care team affect of pain on daily activities; Reviewed with patient prescribed pharmacological and nonpharmacological pain relief strategies; such as use of her new heating blanket along with use of hot baths with Epsom Salt.  Skin Issues: Bilateral leg abscesses  Goal on track:  NO. Evaluation of current treatment plan related to  bilateral leg abscesses ,  self-management and patient's adherence to plan as established by provider.  Patient reports there are no new abscesses that have developed.  Old abscesses remain about the same.  Patient plans on attending new patient visit with surgeon on 08/17/2021 to address recurrent abscesses. Discussed plans with patient for ongoing care management follow up and provided patient with direct contact information for care management team Evaluation of current treatment plan related to skin issues and patient's adherence to plan as established by provider; Reviewed medications with patient and discussed need to discuss with PCP the idea of referral to specialist to address reoccurring abscesses; Social Work referral for financial assistance with utilities and rent, plus patient's request to find a new PCP covered by [employer] Plan to follow up with [facility] BSW regarding status os finding new PCP covered by [employer].  Patient  Goals/Self-Care Activities: Patient will self administer medications as prescribed Patient will attend all scheduled provider appointments Patient will call pharmacy for medication refills Patient will continue to perform ADL's independently Patient will continue to perform IADL's  independently Patient will call provider office for new concerns or questions Patient will work with BSW to address care coordination needs and will continue to work with the clinical team to address health care and disease management related needs.    Follow Up Plan:  Telephone follow up appointment with care management team member scheduled for:  09/08/2021 at 10:00 AM The patient has been provided with contact information for the care management team and has been advised to call with any health related questions or concerns.  The care management team will reach out to the patient again over the next 30 days.

## 2021-08-12 ENCOUNTER — Ambulatory Visit: Payer: Self-pay

## 2021-08-12 NOTE — Telephone Encounter (Signed)
°  Chief Complaint: 3 boils to groin area Symptoms: Painful, not draining. Frequency: Started 2 months ago Pertinent Negatives: Patient denies fever Disposition: [] ED /[] Urgent Care (no appt availability in office) / [] Appointment(In office/virtual)/ []  [facility]/ [] Home Care/ [] Refused Recommended Disposition  Additional Notes: Pt. Asking for Percocet for pain. Please advise.      Answer Assessment - Initial Assessment Questions 1. APPEARANCE of BOIL: "What does the boil look like?"      Small 2. LOCATION: "Where is the boil located?"      Near groin  3. NUMBER: "How many boils are there?"      3 4. SIZE: "How big is the boil?" (e.g., inches, cm; compare to size of a coin or other object)     Tip of thumb 5. ONSET: "When did the boil start?"     2 months 6. PAIN: "Is there any pain?" If Yes, ask: "How bad is the pain?"   (Scale 1-10; or mild, moderate, severe)     Severe 7. FEVER: "Do you have a fever?" If Yes, ask: "What is it, how was it measured, and when did it start?"      No 8. SOURCE: "Have you been around anyone with boils or other Staph infections?" "Have you ever had boils before?"     No 9. OTHER SYMPTOMS: "Do you have any other symptoms?" (e.g., shaking chills, weakness, rash elsewhere on body)     No 10. PREGNANCY: "Is there any chance you are pregnant?" "When was your last menstrual period?"       No  Protocols used: Boil (Skin Abscess)-A-AH

## 2021-08-12 NOTE — Telephone Encounter (Signed)
Pt scheduled for an appt tomorrow with Leotis Shames.

## 2021-08-13 ENCOUNTER — Ambulatory Visit (INDEPENDENT_AMBULATORY_CARE_PROVIDER_SITE_OTHER): Payer: Medicaid Other | Admitting: Nurse Practitioner

## 2021-08-13 ENCOUNTER — Other Ambulatory Visit: Payer: Self-pay

## 2021-08-13 ENCOUNTER — Encounter: Payer: Self-pay | Admitting: Nurse Practitioner

## 2021-08-13 VITALS — BP 113/76 | HR 118 | Wt 172.2 lb

## 2021-08-13 DIAGNOSIS — L739 Follicular disorder, unspecified: Secondary | ICD-10-CM

## 2021-08-13 MED ORDER — OXYCODONE-ACETAMINOPHEN 10-325 MG PO TABS: 1.0000 | ORAL_TABLET | Freq: Three times a day (TID) | ORAL | 0 refills | Status: AC | PRN

## 2021-08-13 NOTE — Assessment & Plan Note (Signed)
Symptoms improved, then worsened. 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 PDMP reviewed 4 days percocet prescribed, warm compresses for pain, relief.

## 2021-08-13 NOTE — Progress Notes (Signed)
Acute Office Visit  Subjective:    Patient ID: Karina Anderson, female    DOB: 12-Nov-1970, 50 y.o.   MRN: [id number]  Chief Complaint  Patient presents with   Recurrent Skin Infections    Patient states she as another flare up in her right groin area. Patient states she has 3 bumps. Patient denies any areas draining.    HPI Patient is in today for skin abscess to right groin. She states that she had finished taking the antibiotic, and her abscesses had cleared up for 1 week and then they returned. She is having 10/10 pain. She has an appointment with the surgeon in 4 days and has a ride to pick her up. She is asking for pain medication until she gets to the appointment on Tuesday. She denies fevers.   Past Medical History:  Diagnosis Date   Chronic lower back pain    Hydradenitis    Seizures ([place])     Past Surgical History:  Procedure Laterality Date   DILATION AND CURETTAGE OF UTERUS     3 miscarriages   HYDRADENITIS EXCISION     R underarm   TUBAL LIGATION     TUMOR REMOVAL  07/2020   index finger on L hand    Family History  Problem Relation Age of Onset   Seizures Mother    Diabetes Mother    Heart attack Father    Hypertension Brother    Eczema Daughter    Anxiety disorder Maternal Grandmother    Hypertension Brother    Hypertension Brother    Hypertension Brother    Hypertension Brother     Social History   Socioeconomic History   Marital status: Single    Spouse name: Not on file   Number of children: Not on file   Years of education: Not on file   Highest education level: Not on file  Occupational History   Not on file  Tobacco Use   Smoking status: Every Day    Packs/day: 0.50    Types: Cigarettes   Smokeless tobacco: Never   Tobacco comments:    Has smoked since the age of 50 yrs old.  Vaping Use   Vaping Use: Never used  Substance and Sexual Activity   Alcohol use: No    Comment: "Rarely"   Drug use: No   Sexual activity: Not Currently   Other Topics Concern   Not on file  Social History Narrative   Not on file   Social Determinants of Health   Financial Resource Strain: High Risk   Difficulty of Paying Living Expenses: Hard  Food Insecurity: No Food Insecurity   Worried About Running Out of Food in the Last Year: Never true   Ran Out of Food in the Last Year: Never true  Transportation Needs: No Transportation Needs   Lack of Transportation (Medical): No   Lack of Transportation (Non-Medical): No  Physical Activity: Sufficiently Active   Days of Exercise per Week: 3 days   Minutes of Exercise per Session: 60 min  Stress: Stress Concern Present   Feeling of Stress : Rather much  Social Connections: Unknown   Frequency of Communication with Friends and Family: More than three times a week   Frequency of Social Gatherings with Friends and Family: More than three times a week   Attends Religious Services: Not on file   Active Member of Clubs or Organizations: Not on file   Attends Archivist Meetings: Not on file  Marital Status: Never married  Human resources officer Violence: Not on file    Outpatient Medications Prior to Visit  Medication Sig Dispense Refill   cyclobenzaprine (FLEXERIL) 10 MG tablet Take 1 tablet (10 mg total) by mouth 3 (three) times daily as needed for muscle spasms. 30 tablet 1   phenytoin (DILANTIN) 100 MG ER capsule TAKE 1 CAPSULE(100 MG) BY MOUTH THREE TIMES DAILY 90 capsule 2   clotrimazole (LOTRIMIN) 1 % cream Apply 1 application topically 2 (two) times daily. (Patient not taking: Reported on 08/13/2021) 30 g 0   doxycycline (VIBRA-TABS) 100 MG tablet Take 1 tablet (100 mg total) by mouth 2 (two) times daily. (Patient not taking: Reported on 08/13/2021) 20 tablet 0   fluticasone (FLONASE) 50 MCG/ACT nasal spray Place 2 sprays into both nostrils daily. (Patient not taking: Reported on 06/24/2021) 16 g 6   lidocaine (XYLOCAINE) 2 % solution Use as directed 15 mLs in the mouth or  throat as needed for mouth pain. Apply to painful boils as every 2 hours as needed for pain. (Patient not taking: Reported on 07/15/2021) 100 mL 0   omeprazole (PRILOSEC OTC) 20 MG tablet Take 20 mg by mouth daily. (Patient not taking: Reported on 07/15/2021) 28 tablet 1   No facility-administered medications prior to visit.    Allergies  Allergen Reactions   Citrus Hives   Sulfamethoxazole-Trimethoprim Hives   Butorphanol Hives   Butorphanol Tartrate Hives   Contrast Media [Iodinated Diagnostic Agents] Nausea And Vomiting   Food Other (See Comments)    Allergic to Citrus Fruits.   Ibuprofen    Ketorolac    Lorazepam Hives   Naproxen    Prochlorperazine Hives   Nsaids Rash    Review of Systems  Constitutional: Negative.   Respiratory: Negative.    Cardiovascular: Negative.   Skin:        Boil to right inner thigh      Objective:    Physical Exam Vitals and nursing note reviewed.  Constitutional:      General: She is not in acute distress.    Appearance: Normal appearance.  HENT:     Head: Normocephalic.  Eyes:     Conjunctiva/sclera: Conjunctivae normal.  Cardiovascular:     Rate and Rhythm: Normal rate.     Pulses: Normal pulses.  Pulmonary:     Effort: Pulmonary effort is normal.  Musculoskeletal:     Cervical back: Normal range of motion.  Skin:    General: Skin is warm.     Comments: Small, red raised area to right inner thigh. No signs of draining.   Neurological:     General: No focal deficit present.     Mental Status: She is alert and oriented to person, place, and time.  Psychiatric:        Mood and Affect: Mood normal.        Behavior: Behavior normal.        Thought Content: Thought content normal.        Judgment: Judgment normal.    BP 113/76    Pulse (!) 118    Wt 172 lb 3.2 oz (78.1 kg)    SpO2 99%    BMI 28.66 kg/m  Wt Readings from Last 3 Encounters:  08/13/21 172 lb 3.2 oz (78.1 kg)  07/15/21 173 lb 8 oz (78.7 kg)  06/29/21 173 lb  (78.5 kg)    Health Maintenance Due  Topic Date Due   COVID-19 Vaccine (1) Never done  Pneumococcal Vaccine 54-23 Years old (1 - PCV) Never done    There are no preventive care reminders to display for this patient.   Lab Results  Component Value Date   TSH 1.790 12/31/2020   Lab Results  Component Value Date   WBC 9.4 12/09/2020   HGB 12.4 12/09/2020   HCT 37.0 12/09/2020   MCV 82.4 12/09/2020   PLT 326 12/09/2020   Lab Results  Component Value Date   NA 134 (L) 12/09/2020   K 3.9 12/09/2020   CO2 22 12/09/2020   GLUCOSE 166 (H) 12/09/2020   BUN 16 12/09/2020   CREATININE 0.65 12/09/2020   BILITOT 0.5 12/09/2020   ALKPHOS 68 12/09/2020   AST 18 12/09/2020   ALT 15 12/09/2020   PROT 7.8 12/09/2020   ALBUMIN 3.8 12/09/2020   CALCIUM 9.2 12/09/2020   ANIONGAP 9 12/09/2020   EGFR 94 12/01/2020   Lab Results  Component Value Date   CHOL 249 (H) 12/31/2020   Lab Results  Component Value Date   HDL 49 12/31/2020   Lab Results  Component Value Date   LDLCALC 158 (H) 12/31/2020   Lab Results  Component Value Date   TRIG 228 (H) 12/31/2020   Lab Results  Component Value Date   CHOLHDL 4.7 03/12/2016   Lab Results  Component Value Date   HGBA1C 5.4 12/31/2020       Assessment & Plan:   Problem List Items Addressed This Visit       Musculoskeletal and Integument   Folliculitis - Primary    Symptoms improved, then worsened. 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 PDMP reviewed 4 days percocet prescribed, warm compresses for pain, relief.        Meds ordered this encounter  Medications   oxyCODONE-acetaminophen (PERCOCET) 10-325 MG tablet    Sig: Take 1 tablet by mouth every 8 (eight) hours as needed for up to 5 days for pain.    Dispense:  12 tablet    Refill:  0     Charyl Dancer, NP

## 2021-08-17 ENCOUNTER — Ambulatory Visit: Payer: Medicaid Other | Admitting: Surgery

## 2021-08-25 ENCOUNTER — Ambulatory Visit: Payer: Self-pay

## 2021-08-25 NOTE — Telephone Encounter (Signed)
°  Chief Complaint: hemorrhoids Symptoms: hemorrhoids, 10/10 pain, abd pain Frequency: several days Pertinent Negatives: Patient denies bleeding Disposition: [] ED /[] Urgent Care (no appt availability in office) / [x] Appointment(In office/virtual)/ []  [facility]/ [] Home Care/ [] Refused Recommended Disposition  Additional Notes: Pt states she has used OTC creams and wipes and nothing is helping. Scheduled OV for 08/26/21 at 0820 with Dr. . Pt is asking is there anything that can be called in for pain prior to appt. Advised her that I will send message and let her know. Pt verbalized understanding.    Summary: hemorrhoids for 4 days   Pt has been having issue with painful Hemorrhoids for the past 4 days./ pt stated she has used creams and wipes and they will not go away/ pt asked to speak with nurse asap / please advise      Reason for Disposition  SEVERE rectal pain (e.g., excruciating, unable to have a bowel movement)  Answer Assessment - Initial Assessment Questions 1. SYMPTOM:  "What's the main symptom you're concerned about?" (e.g., pain, itching, swelling, rash)     hemorrhoids 2. ONSET: "When did the sx  start?"     4-5 days 3. RECTAL PAIN: "Do you have any pain around your rectum?" "How bad is the pain?"  (Scale 0-10; or mild, moderate, severe)   - NONE (0): no pain   - MILD (1-3): doesn't interfere with normal activities    - MODERATE (4-7): interferes with normal activities or awakens from sleep, limping    - SEVERE (8-10): excruciating pain, unable to have a bowel movement      10 4. RECTAL ITCHING: "Do you have any itching in this area?" "How bad is the itching?"  (Scale 0-10; or mild, moderate, severe)   - NONE: no itching   - MILD: doesn't interfere with normal activities    - MODERATE-SEVERE: interferes with normal activities or awakens from sleep     No 5. CONSTIPATION: "Do you have constipation?" If Yes, ask: "How bad is it?"     Was but took meds and  relief 7. OTHER SYMPTOMS: "Do you have any other symptoms?"  (e.g., abdomen pain, fever, rectal bleeding, vomiting)     Abdominal pain  Protocols used: Rectal Symptoms-A-AH

## 2021-08-26 ENCOUNTER — Ambulatory Visit (INDEPENDENT_AMBULATORY_CARE_PROVIDER_SITE_OTHER): Payer: Medicaid Other | Admitting: Family Medicine

## 2021-08-26 ENCOUNTER — Other Ambulatory Visit: Payer: Self-pay

## 2021-08-26 ENCOUNTER — Encounter: Payer: Self-pay | Admitting: Family Medicine

## 2021-08-26 VITALS — BP 96/68 | HR 118 | Temp 98.3°F | Wt 171.0 lb

## 2021-08-26 DIAGNOSIS — K649 Unspecified hemorrhoids: Secondary | ICD-10-CM

## 2021-08-26 MED ORDER — HYDROCORTISONE ACETATE 25 MG RE SUPP: 25.0000 mg | Freq: Two times a day (BID) | RECTAL | 0 refills | Status: DC

## 2021-08-26 NOTE — Progress Notes (Signed)
BP 96/68    Pulse (!) 118    Temp 98.3 F (36.8 C)    Wt 171 lb (77.6 kg)    SpO2 96%    BMI 28.46 kg/m    Subjective:    Patient ID: [employer], female    DOB: [birth year], 50 y.o.   MRN: [id number]  HPI: Karina Anderson is a 50 y.o. female  Chief Complaint  Patient presents with   Hemorrhoids    Patient states she noticed a hemorrhoid about 5 days ago, causing a lot of pain. Has tried otc creams, and proper cleaning.    HEMORRHOIDS Duration: 5 days Anal fullness: yes Perianal itching/irritation: yes Perianal pain: yes Bright red rectal bleeding: no Amount of blood:  no bleeding Frequency: constant Constipation: no Hard stools: no Chronic straining/valsava: no Status: stable Treatments attempted: OTC medicated wipes and hydrocortisone cream  Previous hemorrhoids: yes  Colonoscopy: no  Relevant past medical, surgical, family and social history reviewed and updated as indicated. Interim medical history since our last visit reviewed. Allergies and medications reviewed and updated.  Review of Systems  Constitutional: Negative.   Respiratory: Negative.    Cardiovascular: Negative.   Gastrointestinal:  Positive for rectal pain. Negative for abdominal distention, abdominal pain, anal bleeding, blood in stool, constipation, diarrhea, nausea and vomiting.  Musculoskeletal: Negative.   Skin: Negative.   Psychiatric/Behavioral: Negative.     Per HPI unless specifically indicated above     Objective:    BP 96/68    Pulse (!) 118    Temp 98.3 F (36.8 C)    Wt 171 lb (77.6 kg)    SpO2 96%    BMI 28.46 kg/m   Wt Readings from Last 3 Encounters:  08/26/21 171 lb (77.6 kg)  08/13/21 172 lb 3.2 oz (78.1 kg)  07/15/21 173 lb 8 oz (78.7 kg)    Physical Exam Vitals and nursing note reviewed.  Constitutional:      General: She is not in acute distress.    Appearance: Normal appearance. She is not ill-appearing, toxic-appearing or diaphoretic.  HENT:     Head:  Normocephalic and atraumatic.     Right Ear: External ear normal.     Left Ear: External ear normal.     Nose: Nose normal.     Mouth/Throat:     Mouth: Mucous membranes are moist.     Pharynx: Oropharynx is clear.  Eyes:     General: No scleral icterus.       Right eye: No discharge.        Left eye: No discharge.     Extraocular Movements: Extraocular movements intact.     Conjunctiva/sclera: Conjunctivae normal.     Pupils: Pupils are equal, round, and reactive to light.  Cardiovascular:     Rate and Rhythm: Normal rate and regular rhythm.     Pulses: Normal pulses.     Heart sounds: Normal heart sounds. No murmur heard.   No friction rub. No gallop.  Pulmonary:     Effort: Pulmonary effort is normal. No respiratory distress.     Breath sounds: Normal breath sounds. No stridor. No wheezing, rhonchi or rales.  Chest:     Chest wall: No tenderness.  Musculoskeletal:        General: Normal range of motion.     Cervical back: Normal range of motion and neck supple.  Skin:    General: Skin is warm and dry.     Capillary Refill: Capillary refill  takes less than 2 seconds.     Coloration: Skin is not jaundiced or pale.     Findings: No bruising, erythema, lesion or rash.  Neurological:     General: No focal deficit present.     Mental Status: She is alert and oriented to person, place, and time. Mental status is at baseline.  Psychiatric:        Mood and Affect: Mood normal.        Behavior: Behavior normal.        Thought Content: Thought content normal.        Judgment: Judgment normal.    Results for orders placed or performed in visit on 04/01/21  WET PREP FOR TRICH, YEAST, CLUE   Specimen: Sterile Swab   Sterile Swab  Result Value Ref Range   Trichomonas Exam Negative Negative   Yeast Exam Negative Negative   Clue Cell Exam Negative Negative  Chlamydia/GC NAA, Confirmation   Specimen: Urine   UR  Result Value Ref Range   Chlamydia trachomatis, NAA Negative  Negative   Neisseria gonorrhoeae, NAA Negative Negative  Urinalysis, Routine w reflex microscopic  Result Value Ref Range   Specific Gravity, UA 1.025 1.005 - 1.030   pH, UA 5.5 5.0 - 7.5   Color, UA Yellow Yellow   Appearance Ur Cloudy (A) Clear   Leukocytes,UA Negative Negative   Protein,UA Trace (A) Negative/Trace   Glucose, UA Negative Negative   Ketones, UA Negative Negative   RBC, UA Negative Negative   Bilirubin, UA Negative Negative   Urobilinogen, Ur 0.2 0.2 - 1.0 mg/dL   Nitrite, UA Negative Negative      Assessment & Plan:   Problem List Items Addressed This Visit   None Visit Diagnoses     Hemorrhoids, unspecified hemorrhoid type    -  Primary   Will get her into GI and treat with anusol and OTC stool softeners. Call if starts bleeding or any other concerns.    Relevant Orders   Ambulatory referral to Gastroenterology        Follow up plan: Return if symptoms worsen or fail to improve.

## 2021-08-31 ENCOUNTER — Other Ambulatory Visit: Payer: Self-pay

## 2021-08-31 NOTE — Patient Instructions (Signed)
Visit Information  Karina Anderson was given information about Medicaid Managed Care team care coordination services as a part of their [facility] Plan Medicaid benefit. Karina Anderson verbally consented to engagement with the [facility] Managed Care team.   If you are experiencing a medical emergency, please call 911 or report to your local emergency department or urgent care.   If you have a non-emergency medical problem during routine business hours, please contact your provider's office and ask to speak with a nurse.   For questions related to your [facility], please call: [phone] or visit the homepage here: [email or web address]  If you would like to schedule transportation through your [facility], please call the following number at least 2 days in advance of your appointment: [phone].   Call the Behavioral Health Crisis Line at [phone], at any time, 24 hours a day, 7 days a week. If you are in danger or need immediate medical attention call 911.  If you would like help to quit smoking, call 1-800-QUIT-NOW ((541)527-6393) OR Espaol: 1-855-Djelo-Ya (3-382-505-3976) o para ms informacin haga clic aqu or Text READY to 734-193 to register via text  [name] - following are the goals we discussed in your visit today:   Goals Addressed   None     Social Worker will follow up with patient .   Karina Anderson, BSW, MHA [facility]   [place]  High Risk Managed Medicaid Team  [phone]   Following is a copy of your plan of care:  Care Plan : RN Care Manager Plan of Care  Updates made by Shaune Leeks since 08/31/2021 12:00 AM     Problem: Chronic Disease Management and Care Coordination Needs for Chronic Pain & Skin Issues   Priority: High     Long-Range Goal: Development of Plan of Care for Chronic Disease Management and Care  Coordination Needs (Chronic Pain and Skin Issues)   Start Date: 06/24/2021  Expected End Date: 10/22/2021  Priority: High  Note:   Current Barriers:  Knowledge Deficits related to plan of care for management of chronic low back pain and skin issues Care Coordination needs related to Financial constraints related to utilities and Housing barriers Chronic Disease Management support and education needs related to chronic low back pain and skin issues. Financial Constraints No Advanced Directives in place  RNCM Clinical Goal(s): New Goals Patient will verbalize understanding of plan for management of chronic low back pain and skin issues. 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 demonstrate Ongoing adherence to prescribed treatment plan for chronic low back pain and skin issues as evidenced by reduced pain on pain scale and improvement of skin issues. demonstrate Ongoing health management independence   continue to work with RN Care Manager to address care management and care coordination needs related to  Chronic low back pain and skin issues work with Child psychotherapist to address  related to the management of Financial constraints related to utilities and Housing barriers related to the management of chronic low back pain and skin issues  through collaboration with Medical illustrator, provider, and care team.   Interventions: Inter-disciplinary care team collaboration (see longitudinal plan of care) Evaluation  of current treatment plan related to  self management and patient's adherence to plan as established by provider 07/05/21: BSW contacted patient regarding financial assistance. Patient stated  she needs assistance with utility bill that is $2,000 and she has a cut off notice for 11/29. Patient stated she has already reached out to [place] and have not heard anything back. Patient asked for resources to be sent to her via text message. Patient stated she would also like a new PCP, patient feels as though the office has her best interest at heart. Patient also stated [place] [facility] due to her rent being increased to 845 monthly and she only gets 841 in disability each month. 07/09/21: BSW researched resources for patient for loans, [employer] in [place], affordable apartments in [place] and [employer]'s [place] for patient to outreach. Patient stated that DSS did give her a call back and will assist her with $600 for her utility bill.  07/29/21: Patient stated she still wants to go to a new doctor because she is still in pain. Patient states she has called Psychologist, [employer]Housing Authority in [place] and in [place] and they are not taking applications. Patient stated there is a govenerment program that assists with rent and she would give them a call. BSW asked patient about transportation to her appointment on 12/6 she stated she does not have transportation, BSW contacted [employer] to schedule transportation. [employer]'s transportation confirmation number is [phone] and they will be to pick patient up at 1:00pm. BSW contacted patient and informed her of pick up time. 08/31/20: BSW contacted patient to complete follow up. Patient stated she did not receive her daughters disability this month because of an appeal that is needed. Patient stated that they are trying to say that patient daughter is no longer disabled and can worked. Patient states she has not been able to pay her rent due to not receiving the disability check. Patient stated she did pay 200 and is in need of 650 for the remainder. Patient stated she has surgery next week and she would like for her rent to be paid before she  goes. BSW provided patient with the phone number for [employer], [employer], and [employer].   Pain Interventions: On going Goal (progressing - Not on Track) Pain assessment performed. Patient reports pain "10" on a pain scale of 1 - 10 due to chronic lower back pain new bilateral thigh abscesses and one at Left underarm. Medications reviewed Reviewed provider established plan for pain management; Discussed importance of adherence to all scheduled medical appointments; Counseled on the importance of reporting any/all new or changed pain symptoms or management strategies to pain management provider; Advised patient to report to care team affect of pain on daily activities; Reviewed with patient prescribed pharmacological and nonpharmacological pain relief strategies; such as use of her new heating blanket along with use of hot baths with Epsom Salt.  Skin Issues: Bilateral leg abscesses  Goal on track:  NO. Evaluation of current treatment plan related to  bilateral leg abscesses ,  self-management and patient's adherence to plan as established by provider.  Patient reports there are no new abscesses that have developed.  Old abscesses remain about the same.  Patient plans on attending new patient visit with surgeon on 08/17/2021 to address recurrent abscesses. Discussed plans with patient for ongoing care management follow up and provided patient with direct contact information for care management team Evaluation  of current treatment plan related to skin issues and patient's adherence to plan as established by provider; Reviewed medications with patient and discussed need to discuss with PCP the idea of referral to specialist to address reoccurring abscesses; Social Work referral for financial assistance with utilities and rent, plus patient's request to find a new PCP covered by [employer] Plan to follow up with [facility] BSW regarding status os finding new PCP  covered by [employer].  Patient Goals/Self-Care Activities: Patient will self administer medications as prescribed Patient will attend all scheduled provider appointments Patient will call pharmacy for medication refills Patient will continue to perform ADL's independently Patient will continue to perform IADL's independently Patient will call provider office for new concerns or questions Patient will work with BSW to address care coordination needs and will continue to work with the clinical team to address health care and disease management related needs.    Follow Up Plan:  Telephone follow up appointment with care management team member scheduled for:  09/08/2021 at 10:00 AM The patient has been provided with contact information for the care management team and has been advised to call with any health related questions or concerns.  The care management team will reach out to the patient again over the next 30 days.

## 2021-08-31 NOTE — Patient Outreach (Signed)
Medicaid Managed Care Social Work Note  08/31/2021 Name:  Tandi Beres MRN:  [id number] DOB:  05-15-1971  York is an 51 y.o. year old female who is a primary patient of McElwee, Lauren A, NP.  The Medicaid Managed Care Coordination team was consulted for assistance with:  Community Resources   Ms. Sorbello was given information about [employer] team services today. [place] Patient agreed to services and verbal consent obtained.  Engaged with patient  for by telephone forfollow up visit in response to referral for case management and/or care coordination services.   Assessments/Interventions:  Review of past medical history, allergies, medications, health status, including review of consultants reports, laboratory and other test data, was performed as part of comprehensive evaluation and provision of chronic care management services.  SDOH: (Social Determinant of Health) assessments and interventions performed:  BSW contacted patient to complete follow up. Patient stated she did not receive her daughters disability this month because of an appeal that is needed. Patient stated that they are trying to say that patient daughter is no longer disabled and can worked. Patient states she has not been able to pay her rent due to not receiving the disability check. Patient stated she did pay 200 and is in need of 650 for the remainder. Patient stated she has surgery next week and she would like for her rent to be paid before she goes. BSW provided patient with the phone number for [place], [employer], and [employer].  Advanced Directives Status:  Not addressed in this encounter.  Care Plan                 Allergies  Allergen Reactions   Citrus Hives   Sulfamethoxazole-Trimethoprim Hives   Butorphanol Hives   Butorphanol Tartrate Hives   Contrast Media [Iodinated Contrast Media] Nausea And Vomiting   Food Other (See Comments)     Allergic to Citrus Fruits.   Ibuprofen    Ketorolac    Lorazepam Hives   Naproxen    Prochlorperazine Hives   Nsaids Rash    Medications Reviewed Today     Reviewed by Valerie Roys, DO (Physician) on 08/26/21 at 0849  Med List Status: <None>   Medication Order Taking? Sig Documenting Provider Last Dose Status Informant  clotrimazole (LOTRIMIN) 1 % cream [id number] Yes Apply 1 application topically 2 (two) times daily. Charyl Dancer, NP Taking Active            Med Note [facility], MAUREEN A   Thu Jun 24, 2021  1:06 PM) Using PRN  cyclobenzaprine (FLEXERIL) 10 MG tablet [id number] Yes Take 1 tablet (10 mg total) by mouth 3 (three) times daily as needed for muscle spasms. McElwee, Lauren A, NP Taking Active   fluticasone (FLONASE) 50 MCG/ACT nasal spray [id number] Yes Place 2 sprays into both nostrils daily. Charyl Dancer, NP Taking Active            Med Note [facility], Gwenette Greet A   Thu Jun 24, 2021  1:07 PM) Using PRN  hydrocortisone (ANUSOL-HC) 25 MG suppository [id number] Yes Place 1 suppository (25 mg total) rectally 2 (two) times daily. Johnson, Megan P, DO  Active   omeprazole (PRILOSEC OTC) 20 MG tablet [id number] Yes Take 20 mg by mouth daily. [provider] Taking Active            Med Note Donnetta Simpers, CRISTINA   Thu Aug 26, 2021  8:23 AM) prn  phenytoin (DILANTIN) 100 MG ER capsule [id number] Yes TAKE 1 CAPSULE(100 MG) BY MOUTH THREE TIMES DAILY [name], NP Taking Active             Patient Active Problem List   Diagnosis Date Noted   Folliculitis [id number]   Chronic pain syndrome 04/07/2021   Pharmacologic therapy 04/07/2021   Disorder of skeletal system 04/07/2021   Problems influencing health status 04/07/2021   Arthritis of left knee 04/01/2021   Diarrhea 03/23/2021   Nausea and vomiting 03/23/2021   Impaired fasting glucose 12/31/2020   History of psychosis 12/01/2020   History of suicide attempt 12/01/2020   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   Bone lesion 02/28/2020   Tobacco use disorder 03/13/2016   Cannabis use disorder, moderate, dependence ([place]) 03/13/2016   Bipolar affective disorder, manic, severe, with psychotic behavior ([place]) 03/12/2016   Seizures ([place]) 03/11/2016   Involuntary commitment 03/11/2016   Syphilis, unspecified 08/29/1898    Conditions to be addressed/monitored per PCP order:   rent assistance   Care Plan : RN Care Manager Plan of Care  Updates made by Ethelda Chick since 08/31/2021 12:00 AM     Problem: Chronic Disease Management and Care Coordination Needs for Chronic Pain & Skin Issues   Priority: High     Long-Range Goal: Development of Plan of Care for Chronic Disease Management and Care Coordination Needs (Chronic Pain and Skin Issues)   Start Date: 06/24/2021  Expected End Date: 10/22/2021  Priority: High  Note:   Current Barriers:  Knowledge Deficits related to plan of care for management of chronic low back pain and skin issues Care Coordination needs related to Financial constraints related to utilities and Housing barriers Chronic Disease Management support and education needs related to chronic low back pain and skin issues. Financial Constraints No Advanced Directives in place  RNCM Clinical Goal(s): New Goals Patient will verbalize understanding of plan for management of chronic low back pain and skin issues. 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 demonstrate Ongoing adherence to prescribed treatment plan for chronic low back pain and skin issues as evidenced by reduced pain on pain scale and improvement of skin issues. demonstrate  Ongoing health management independence   continue to work with RN Care Manager to address care management and care coordination needs related to  Chronic low back pain and skin issues work with Education officer, museum to address  related to the management of Financial constraints related to utilities and Housing barriers related to the management of chronic low back pain and skin issues  through collaboration with Consulting civil engineer, provider, and care team.   Interventions: Inter-disciplinary care team collaboration (see longitudinal plan of care) Evaluation of current treatment plan related to  self management and patient's adherence to plan as established by provider 07/05/21: BSW contacted patient regarding financial assistance. Patient stated she needs assistance with utility bill that is $2,000 and she has a cut off notice for 11/29. Patient stated she has already reached out to [facility] and have not heard anything back. Patient asked for resources to be sent to her via text message. Patient stated she would also like a new PCP, patient feels as  though the office has her best interest at heart. Patient also stated she would like to move to [facility] due to her rent being increased to 845 monthly and she only gets 841 in disability each month. 07/09/21: BSW researched resources for patient for loans, [employer] in [place], affordable apartments in [place] and [employer] in [place] for patient to outreach. Patient stated that DSS did give her a call back and will assist her with $600 for her utility bill.  07/29/21: Patient stated she still wants to go to a new doctor because she is still in pain. Patient states she has called Heritage manager in [place] and in [place] and they are not taking applications. Patient stated there is a govenerment program that assists with rent and she would give them a call. BSW asked patient about transportation to her appointment on 12/6 she stated she does not have transportation,  BSW contacted [employer] to schedule transportation. [employer]'s transportation confirmation number is [phone] and they will be to pick patient up at 1:00pm. BSW contacted patient and informed her of pick up time. 08/31/20: BSW contacted patient to complete follow up. Patient stated she did not receive her daughters disability this month because of an appeal that is needed. Patient stated that they are trying to say that patient daughter is no longer disabled and can worked. Patient states she has not been able to pay her rent due to not receiving the disability check. Patient stated she did pay 200 and is in need of 650 for the remainder. Patient stated she has surgery next week and she would like for her rent to be paid before she goes. BSW provided patient with the phone number for [place], [employer], and [employer].   Pain Interventions: On going Goal (progressing - Not on Track) Pain assessment performed. Patient reports pain "10" on a pain scale of 1 - 10 due to chronic lower back pain new bilateral thigh abscesses and one at Left underarm. Medications reviewed Reviewed provider established plan for pain management; Discussed importance of adherence to all scheduled medical appointments; Counseled on the importance of reporting any/all new or changed pain symptoms or management strategies to pain management provider; Advised patient to report to care team affect of pain on daily activities; Reviewed with patient prescribed pharmacological and nonpharmacological pain relief strategies; such as use of her new heating blanket along with use of hot baths with Epsom Salt.  Skin Issues: Bilateral leg abscesses  Goal on track:  NO. Evaluation of current treatment plan related to  bilateral leg abscesses ,  self-management and patient's adherence to plan as established by provider.  Patient reports there are no new abscesses that have developed.  Old abscesses remain  about the same.  Patient plans on attending new patient visit with surgeon on 08/17/2021 to address recurrent abscesses. Discussed plans with patient for ongoing care management follow up and provided patient with direct contact information for care management team Evaluation of current treatment plan related to skin issues and patient's adherence to plan as established by provider; Reviewed medications with patient and discussed need to discuss with PCP the idea of referral to specialist to address reoccurring abscesses; Social Work referral for financial assistance with utilities and rent, plus patient's request to find a new PCP covered by [employer] Plan to follow up with [facility] BSW regarding status os finding new PCP covered by [employer].  Patient Goals/Self-Care Activities: Patient will self  administer medications as prescribed Patient will attend all scheduled provider appointments Patient will call pharmacy for medication refills Patient will continue to perform ADL's independently Patient will continue to perform IADL's independently Patient will call provider office for new concerns or questions Patient will work with BSW to address care coordination needs and will continue to work with the clinical team to address health care and disease management related needs.    Follow Up Plan:  Telephone follow up appointment with care management team member scheduled for:  09/08/2021 at 10:00 AM The patient has been provided with contact information for the care management team and has been advised to call with any health related questions or concerns.  The care management team will reach out to the patient again over the next 30 days.      Follow up:  Patient agrees to Care Plan and Follow-up.  Plan: The Managed Medicaid care management team will reach out to the patient again over the next 30 days.  Date/time of next scheduled Social Work care  management/care coordination outreach:  10/01/20  Mickel Fuchs, Arita Miss, [place] Medicaid Team  [phone]

## 2021-09-02 ENCOUNTER — Ambulatory Visit: Payer: Medicaid Other | Admitting: Surgery

## 2021-09-03 ENCOUNTER — Ambulatory Visit: Payer: Medicaid Other | Admitting: Family Medicine

## 2021-09-07 ENCOUNTER — Ambulatory Visit: Payer: Medicaid Other | Admitting: Surgery

## 2021-09-07 ENCOUNTER — Other Ambulatory Visit: Payer: Self-pay

## 2021-09-07 ENCOUNTER — Encounter: Payer: Self-pay | Admitting: Surgery

## 2021-09-07 VITALS — BP 138/92 | HR 89 | Temp 98.3°F | Ht 65.0 in | Wt 173.0 lb

## 2021-09-07 DIAGNOSIS — L739 Follicular disorder, unspecified: Secondary | ICD-10-CM | POA: Diagnosis not present

## 2021-09-07 NOTE — Patient Instructions (Signed)
Call us to be seen if this area starts to become infected.   Hidradenitis Suppurativa Hidradenitis suppurativa is a long-term (chronic) skin disease. It is similar to a severe form of acne, but it affects areas of the body where acne would be unusual, especially areas of the body where skin rubs against skin and becomes moist. These include: Underarms. Groin. Genital area. Buttocks. Upper thighs. Breasts. Hidradenitis suppurativa may start out as small lumps or pimples caused by blocked sweat glands or hair follicles. Pimples may develop into deep sores that break open (rupture) and drain pus. Over time, affected areas of skin may thicken and become scarred. This condition is rare and does not spread from person to person (non-contagious). What are the causes? The exact cause of this condition is not known. It may be related to: Female and female hormones. An overactive disease-fighting system (immune system). The immune system may over-react to blocked hair follicles or sweat glands and cause swelling and pus-filled sores. What increases the risk? You are more likely to develop this condition if you: Are female. Are 46-24 years old. Have a family history of hidradenitis suppurativa. Have a personal history of acne. Are overweight. Smoke. Take the medicine lithium. What are the signs or symptoms? The first symptoms are usually painful bumps in the skin, similar to pimples. The condition may get worse over time (progress), or it may only cause mild symptoms. If the disease progresses, symptoms may include: Skin bumps getting bigger and growing deeper into the skin. Bumps rupturing and draining pus. Itchy, infected skin. Skin getting thicker and scarred. Tunnels under the skin (fistulas) where pus drains from a bump. Pain during daily activities, such as pain during walking if your groin area is affected. Emotional problems, such as stress or depression. This condition may affect your  appearance and your ability or willingness to wear certain clothes or do certain activities. How is this diagnosed? This condition is diagnosed by a health care provider who specializes in skin diseases (dermatologist). You may be diagnosed based on: Your symptoms and medical history. A physical exam. Testing a pus sample for infection. Blood tests. How is this treated? Your treatment will depend on how severe your symptoms are. The same treatment will not work for everybody with this condition. You may need to try several treatments to find what works best for you. Treatment may include: Cleaning and bandaging (dressing) your wounds as needed. Lifestyle changes, such as new skin care routines. Taking medicines, such as: Antibiotics. Acne medicines. Medicines to reduce the activity of the immune system. A diabetes medicine (metformin). Birth control pills, for women. Steroids to reduce swelling and pain. Working with a mental health care provider, if you experience emotional distress due to this condition. If you have severe symptoms that do not get better with medicine, you may need surgery. Surgery may involve: Using a laser to clear the skin and remove hair follicles. Opening and draining deep sores. Removing the areas of skin that are diseased and scarred. Follow these instructions at home: Medicines  Take over-the-counter and prescription medicines only as told by your health care provider. If you were prescribed an antibiotic medicine, take it as told by your health care provider. Do not stop taking the antibiotic even if your condition improves. Skin care If you have open wounds, cover them with a clean dressing as told by your health care provider. Keep wounds clean by washing them gently with soap and water when you bathe. Do not  shave the areas where you get hidradenitis suppurativa. Do not wear deodorant. Wear loose-fitting clothes. Try to avoid getting overheated or  sweaty. If you get sweaty or wet, change into clean, dry clothes as soon as you can. To help relieve pain and itchiness, cover sore areas with a warm, clean washcloth (warm compress) for 5-10 minutes as often as needed. If told by your health care provider, take a bleach bath twice a week: Fill your bathtub halfway with water. Pour in  cup of unscented household bleach. Soak in the tub for 5-10 minutes. Only soak from the neck down. Avoid water on your face and hair. Shower to rinse off the bleach from your skin. General instructions Learn as much as you can about your disease so that you have an active role in your treatment. Work closely with your health care provider to find treatments that work for you. If you are overweight, work with your health care provider to lose weight as recommended. Do not use any products that contain nicotine or tobacco, such as cigarettes and e-cigarettes. If you need help quitting, ask your health care provider. If you struggle with living with this condition, talk with your health care provider or work with a mental health care provider as recommended. Keep all follow-up visits as told by your health care provider. This is important. Where to find more information Hidradenitis [place].: https://www.hs-foundation.org/ American Academy of Dermatology: http://www.nguyen-hutchinson.com/ Contact a health care provider if you have: A flare-up of hidradenitis suppurativa. A fever or chills. Trouble controlling your symptoms at home. Trouble doing your daily activities because of your symptoms. Trouble dealing with emotional problems related to your condition. Summary Hidradenitis suppurativa is a long-term (chronic) skin disease. It is similar to a severe form of acne, but it affects areas of the body where acne would be unusual. The first symptoms are usually painful bumps in the skin, similar to pimples. The condition may only cause mild symptoms, or it  may get worse over time (progress). If you have open wounds, cover them with a clean dressing as told by your health care provider. Keep wounds clean by washing them gently with soap and water when you bathe. Besides skin care, treatment may include medicines, laser treatment, and surgery. This information is not intended to replace advice given to you by your health care provider. Make sure you discuss any questions you have with your health care provider. Document Revised: 06/09/2020 Document Reviewed: 06/09/2020 Elsevier Patient Education  2022 [employer].

## 2021-09-07 NOTE — Progress Notes (Signed)
Patient ID: Karina Anderson, female   DOB: 03-07-1971, 51 y.o.   MRN: [id number]  Chief Complaint: Concerns regarding lingering core in right groin area.  History of Present Illness Karina Anderson is a 51 y.o. female with a prior history of hidradenitis which was treated via excision of axillary skin.  She had an episode of folliculitis/abscess late November which was treated with lancing, and antibiotics.  For 1 reason or another is taken her this long to get into our office here, multiple visit cancellations/no-shows.  However she presents now having no drainage, but concerned about a lingering persistence in the area that has since healed.  She is concerned about a sebaceous cyst type of core, and potential for recurrence.  She reports that the area waxes and wanes with her periods.  But has not drained, nor been inflamed for a month it sounds like.  She is previously had infections in this area since she was 86.  Was never referred to dermatology for hidradenitis suppurativa.   Past Medical History Past Medical History:  Diagnosis Date   Chronic lower back pain    Hydradenitis    Seizures ([place])       Past Surgical History:  Procedure Laterality Date   DILATION AND CURETTAGE OF UTERUS     3 miscarriages   HYDRADENITIS EXCISION     R underarm   TUBAL LIGATION     TUMOR REMOVAL  07/2020   index finger on L hand    Allergies  Allergen Reactions   Citrus Hives   Sulfamethoxazole-Trimethoprim Hives   Butorphanol Hives   Butorphanol Tartrate Hives   Contrast Media [Iodinated Contrast Media] Nausea And Vomiting   Food Other (See Comments)    Allergic to Citrus Fruits.   Ibuprofen    Ketorolac    Lorazepam Hives   Naproxen    Prochlorperazine Hives   Nsaids Rash    Current Outpatient Medications  Medication Sig Dispense Refill   clotrimazole (LOTRIMIN) 1 % cream Apply 1 application topically 2 (two) times daily. 30 g 0   cyclobenzaprine (FLEXERIL) 10 MG tablet Take 1 tablet (10  mg total) by mouth 3 (three) times daily as needed for muscle spasms. 30 tablet 1   fluticasone (FLONASE) 50 MCG/ACT nasal spray Place 2 sprays into both nostrils daily. 16 g 6   hydrocortisone (ANUSOL-HC) 25 MG suppository Place 1 suppository (25 mg total) rectally 2 (two) times daily. 12 suppository 0   omeprazole (PRILOSEC OTC) 20 MG tablet Take 20 mg by mouth daily. 28 tablet 1   phenytoin (DILANTIN) 100 MG ER capsule TAKE 1 CAPSULE(100 MG) BY MOUTH THREE TIMES DAILY 90 capsule 2   No current facility-administered medications for this visit.    Family History Family History  Problem Relation Age of Onset   Seizures Mother    Diabetes Mother    Heart attack Father    Hypertension Brother    Eczema Daughter    Anxiety disorder Maternal Grandmother    Hypertension Brother    Hypertension Brother    Hypertension Brother    Hypertension Brother       Social History Social History   Tobacco Use   Smoking status: Every Day    Packs/day: 0.50    Types: Cigarettes   Smokeless tobacco: Never   Tobacco comments:    Has smoked since the age of 51 yrs old.  Vaping Use   Vaping Use: Never used  Substance Use Topics   Alcohol use: No  Comment: "Rarely"   Drug use: No        Review of Systems  Constitutional: Negative.   HENT: Negative.    Eyes: Negative.   Respiratory: Negative.    Cardiovascular: Negative.   Gastrointestinal: Negative.   Genitourinary: Negative.   Skin: Negative.   Neurological: Negative.   Psychiatric/Behavioral: Negative.       Physical Exam Blood pressure (!) 138/92, pulse 89, temperature 98.3 F (36.8 C), height 5\' 5"  (1.651 m), weight 173 lb (78.5 kg), last menstrual period 08/16/2021, SpO2 97 %. Last Weight  Most recent update: 09/07/2021 10:30 AM    Weight  78.5 kg (173 lb)             CONSTITUTIONAL: Well developed, and nourished, appropriately responsive and aware without distress.   EYES: Sclera non-icteric.   EARS, NOSE, MOUTH  AND THROAT: Mask worn.  Hearing is intact to voice.  NECK: Trachea is midline, and there is no jugular venous distension.  LYMPH NODES:  Lymph nodes in the neck are not enlarged. RESPIRATORY:  Lungs are clear, and breath sounds are equal bilaterally. Normal respiratory effort without pathologic use of accessory muscles. CARDIOVASCULAR: Heart is regular in rate and rhythm. GI: The abdomen is  soft, nontender, and nondistended.   MUSCULOSKELETAL:  Symmetrical muscle tone appreciated in all four extremities.    SKIN: Skin turgor is normal. No pathologic skin lesions appreciated.  With Caryl Lyn as chaperone we evaluated the right groin.  There is no draining lesion, surprisingly no evidence of hidradenitis at all.  There is a small soft area consistent with scarring/healing with possibly a healed granuloma.  There is no defects in the skin, no punctum's, certainly no tunneling or tract formation.  There is no induration, no fluctuation and no tenderness. NEUROLOGIC:  Motor and sensation appear grossly normal.  Cranial nerves are grossly without defect. PSYCH:  Alert and oriented to person, place and time. Affect is appropriate for situation.  Data Reviewed I have personally reviewed what is currently available of the patient's imaging, recent labs and medical records.   Labs:  CBC Latest Ref Rng & Units 12/09/2020 12/01/2020 08/16/2020  WBC 4.0 - 10.5 K/uL 9.4 4.5 5.7  Hemoglobin 12.0 - 15.0 g/dL 12.4 11.8 12.1  Hematocrit 36.0 - 46.0 % 37.0 36.1 37.3  Platelets 150 - 400 K/uL 326 319 305   CMP Latest Ref Rng & Units 12/09/2020 12/01/2020 08/16/2020  Glucose 70 - 99 mg/dL 166(H) 97 137(H)  BUN 6 - 20 mg/dL 16 14 15   Creatinine 0.44 - 1.00 mg/dL 0.65 0.78 0.86  Sodium 135 - 145 mmol/L 134(L) 146(H) 138  Potassium 3.5 - 5.1 mmol/L 3.9 4.0 3.3(L)  Chloride 98 - 111 mmol/L 103 107(H) 103  CO2 22 - 32 mmol/L 22 23 26   Calcium 8.9 - 10.3 mg/dL 9.2 9.5 9.3  Total Protein 6.5 - 8.1 g/dL 7.8 7.1 7.8   Total Bilirubin 0.3 - 1.2 mg/dL 0.5 0.3 0.5  Alkaline Phos 38 - 126 U/L 68 83 74  AST 15 - 41 U/L 18 16 14(L)  ALT 0 - 44 U/L 15 10 11       Imaging:  Within last 24 hrs: No results found.  Assessment    History of hidradenitis, and most recent episode of folliculitis right groin, clearly resolved or the interval. Patient Active Problem List   Diagnosis Date Noted   Folliculitis 99991111   Chronic pain syndrome 04/07/2021   Pharmacologic therapy 04/07/2021   Disorder  of skeletal system 04/07/2021   Problems influencing health status 04/07/2021   Arthritis of left knee 04/01/2021   Diarrhea 03/23/2021   Nausea and vomiting 03/23/2021   Impaired fasting glucose 12/31/2020   History of psychosis 12/01/2020   History of suicide attempt 12/01/2020   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   Bone lesion 02/28/2020   Tobacco use disorder 03/13/2016   Cannabis use disorder, moderate, dependence ([place]) 03/13/2016   Bipolar affective disorder, manic, severe, with psychotic behavior ([place]) 03/12/2016   Seizures ([place]) 03/11/2016   Involuntary commitment 03/11/2016   Syphilis, unspecified 08/29/1898    Plan    No evidence of active disease process in the right groin area.  I do not believe there is a core present as suspected as there is no evidence of sebaceous cyst in this area.  There is no role for pro-active excision of this area.  At present no role for any topical medications or oral medications.  We are delighted to see her make such great progress in the interval.    We discussed referral to dermatology for more proactive treatment in the future, but at the present time there is no evidence of disease.  Face-to-face time spent with the patient and accompanying care providers(if present) was 30 minutes, with more than 50% of the time spent counseling, educating, and coordinating care of the patient.    These notes generated with voice recognition  software. I apologize for typographical errors.  Ronny Bacon M.D., FACS 09/07/2021, 12:47 PM

## 2021-09-08 ENCOUNTER — Telehealth: Payer: Self-pay

## 2021-09-08 ENCOUNTER — Other Ambulatory Visit: Payer: Self-pay

## 2021-09-08 NOTE — Patient Instructions (Signed)
Visit Information  Ms. Penland was given information about Medicaid Managed Care team care coordination services as a part of their [facility] Medicaid benefit. [name] Fleck verbally consented to engagement with the [facility] team.   If you are experiencing a medical emergency, please call 911 or report to your local emergency department or urgent care.   If you have a non-emergency medical problem during routine business hours, please contact your provider's office and ask to speak with a nurse.   For questions related to your [facility] Community [facility] Medicaid, please call: [phone] or visit the homepage here: [email or web address]  If you would like to schedule transportation through your [facility] Community [facility] Medicaid, please call the following number at least 2 days in advance of your appointment: [phone].   Call the Behavioral Health Crisis Line at [phone], at any time, 24 hours a day, 7 days a week. If you are in danger or need immediate medical attention call 911.  If you would like help to quit smoking, call 1-800-QUIT-NOW (337-730-64101-304-279-9461) OR Espaol: 1-855-Djelo-Ya (4-132-440-1027(1-985-378-7753) o para ms informacin haga clic aqu or Text READY to 253-664200-400 to register via text  Karina Anderson - following are the goals we discussed in your visit today:   Goals Addressed   None     Please see education materials related to today's visit provided as print materials.   Print copy of patient instructions provided.   The Managed Medicaid care management team will reach out to the patient again over the next 30 days.   Virgina NorfolkMaureen Sydne Krahl RN, BSN Community Care Coordinator [place]   [facility] Mobile: [phone]   Following is a copy of your plan of care:  Care Plan : RN Care Manager Plan of Care  Updates made by Leane Callavanaugh, Kailey Esquilin A, RN since 09/08/2021 12:00 AM      Problem: Chronic Disease Management and Care Coordination Needs for Chronic Pain & Skin Issues   Priority: High     Long-Range Goal: Development of Plan of Care for Chronic Disease Management and Care Coordination Needs (Chronic Pain and Skin Issues)   Start Date: 06/24/2021  Expected End Date: 10/22/2021  Priority: High  Note:   Current Barriers:  Knowledge Deficits related to plan of care for management of chronic low back pain and skin issues Care Coordination needs related to Financial constraints related to utilities and Housing barriers Chronic Disease Management support and education needs related to chronic low back pain and skin issues. Financial Constraints No Advanced Directives in place  RNCM Clinical Goal(s): New Goals Patient will verbalize understanding of plan for management of chronic low back pain and skin issues. verbalize basic understanding of  chronic low back pain and skin issues disease process and self health management plan   take all medications exactly as prescribed and will call provider for medication related questions attend all scheduled medical appointments: No future appointments scheduled at this time. demonstrate Ongoing adherence to prescribed treatment plan for chronic low back pain and skin issues as evidenced by reduced pain on pain scale and improvement of skin issues. demonstrate Ongoing health management independence   continue to work with RN Care Manager to address care management and care coordination needs related to  Chronic low back pain and skin issues work with Child psychotherapistsocial worker to address  related to the management of Financial constraints related to utilities and Housing barriers related to the management of chronic low back pain and skin issues  through collaboration with RN Care manager, provider, and care team.   Interventions: Inter-disciplinary care team collaboration (see longitudinal plan of care) Evaluation of current treatment  plan related to  self management and patient's adherence to plan as established by provider 07/05/21: BSW contacted patient regarding financial assistance. Patient stated she needs assistance with utility bill that is $2,000 and she has a cut off notice for 11/29. Patient stated she has already reached out to [facility] and have not heard anything back. Patient asked for resources to be sent to her via text message. Patient stated she would also like a new PCP, patient feels as though the office has her best interest at heart. Patient also stated she would like to move to [facility] due to her rent being increased to 845 monthly and she only gets 841 in disability each month. 07/09/21: BSW researched resources for patient for loans, [employer] in [place], affordable apartments in [place] and [employer] in [place] for patient to outreach. Patient stated that DSS did give her a call back and will assist her with $600 for her utility bill.  07/29/21: Patient stated she still wants to go to a new doctor because she is still in pain. Patient states she has called Psychologist, occupational in [place] and in [place] and they are not taking applications. Patient stated there is a govenerment program that assists with rent and she would give them a call. BSW asked patient about transportation to her appointment on 12/6 she stated she does not have transportation, BSW contacted [employer] to schedule transportation. [employer]'s transportation confirmation number is [phone] and they will be to pick patient up at 1:00pm. BSW contacted patient and informed her of pick up time. 08/31/20: BSW contacted patient to complete follow up. Patient stated she did not receive her daughters disability this month because of an appeal that is needed. Patient stated that they are trying to say that patient daughter is no longer disabled and can worked. Patient states she has not been able to pay her rent due to not receiving the disability  check. Patient stated she did pay 200 and is in need of 650 for the remainder. Patient stated she has surgery next week and she would like for her rent to be paid before she goes. BSW provided patient with the phone number for [employer], [employer], and [employer].  Pain Interventions: On going Goal (progressing - Not on Track) Pain assessment performed. Patient reports continued chronic lower back pain "10" on a pain scale of 1 - 10.  Chronic pain is relieved temporarily with use of hot bath and heating blanket after bath.  Medications reviewed Reviewed provider established plan for pain management; Discussed importance of adherence to all scheduled medical appointments; Counseled on the importance of reporting any/all new or changed pain symptoms or management strategies to pain management provider; Advised patient to report to care team affect of pain on daily activities; Reviewed with patient prescribed pharmacological and nonpharmacological pain relief strategies; patient continues to use of her heating blanket along with use of hot baths with Epsom Salt to help relieve pain.  Skin Issues: Bilateral leg abscesses  Goal on track:  Yes. Evaluation of current treatment plan related to  bilateral leg abscesses ,  self-management and patient's adherence to plan as established by provider.  Patient reports there are no abscesses currently.  Patient attended attending new patient visit with surgeon on 09/07/2021 to address recurrent abscesses.  There was no evidence of active disease at yesterday's appointment.  Surgeon recommended patient see a dermatologist in the future when abscesses reoccur. Discussed plans with patient for ongoing care management follow up and provided patient with direct contact information for care management team Evaluation of current treatment plan related to skin issues and patient's adherence to plan as established by provider; Reviewed medications  with patient and discussed need to discuss with PCP the idea of referral to specialist to address reoccurring abscesses; Social Work referral for financial assistance with utilities and rent;  This RN Care Manger placed a phone call and spoke with [facility] BSW, Gus Puma, to inform her that patient has increased stress due to inability to pay this month's rent.  Patient has attempted to contact all community resources for financial assistance and has not been successful in getting assistance.  I asked Jon Gills to contact the patient today, if possible, since patient was very upset about losing her housing.  Patient said this was very urgent and had until Friday 09/10/2021 to work out a plan for rent payment.  [facility] BSW will try to contact patient today.  Patient Goals/Self-Care Activities: Patient will self administer medications as prescribed Patient will attend all scheduled provider appointments Patient will call pharmacy for medication refills Patient will continue to perform ADL's independently Patient will continue to perform IADL's independently Patient will call provider office for new concerns or questions Patient will work with BSW to address care coordination needs and will continue to work with the clinical team to address health care and disease management related needs.    Follow Up Plan:  Telephone follow up appointment with care management team member scheduled for:  10/05/2021 at 10:00 AM The patient has been provided with contact information for the care management team and has been advised to call with any health related questions or concerns.  The care management team will reach out to the patient again over the next 30 days.

## 2021-09-08 NOTE — Patient Outreach (Signed)
Medicaid Managed Care   Nurse Care Manager Note  09/08/2021 Name:  Karina Anderson MRN:  [id number] DOB:  06-22-1971  [name] is an 51 y.o. year old female who is a primary patient of Vigg, Avanti, MD.  The Medicaid Managed Care Coordination team was consulted for assistance with:    Chronic Lower Back Pain and Skin Issues  Karina Anderson was given information about [employer] team services today. [place] Patient agreed to services and verbal consent obtained.  Engaged with patient by telephone for follow up visit in response to provider referral for case management and/or care coordination services.   Assessments/Interventions:  Review of past medical history, allergies, medications, health status, including review of consultants reports, laboratory and other test data, was performed as part of comprehensive evaluation and provision of chronic care management services.  SDOH (Social Determinants of Health) assessments and interventions performed: SDOH Interventions    Flowsheet Row Most Recent Value  SDOH Interventions   Financial Strain Interventions Other (Comment)  [Telephone call to [place] regarding patient's increased financial strain over inability to pay rent this month.]  Housing Interventions Other (Comment)  [Telephone call to [place] regarding increased financial strain/unable to pay rent for apartment this month]       Care Plan  Allergies  Allergen Reactions   Citrus Hives   Sulfamethoxazole-Trimethoprim Hives   Butorphanol Hives   Butorphanol Tartrate Hives   Contrast Media [Iodinated Contrast Media] Nausea And Vomiting   Food Other (See Comments)    Allergic to Citrus Fruits.   Ibuprofen    Ketorolac    Lorazepam Hives   Naproxen    Prochlorperazine Hives   Nsaids Rash    Medications Reviewed Today     Reviewed by Inge Rise, RN (Case Manager) on 09/08/21 at 1029  Med List Status: <None>    Medication Order Taking? Sig Documenting Provider Last Dose Status Informant  clotrimazole (LOTRIMIN) 1 % cream [id number] No Apply 1 application topically 2 (two) times daily. Charyl Dancer, NP Taking Active            Med Note [facility], Dwyane Dupree A   Thu Jun 24, 2021  1:06 PM) Using PRN  cyclobenzaprine (FLEXERIL) 10 MG tablet [id number] No Take 1 tablet (10 mg total) by mouth 3 (three) times daily as needed for muscle spasms. McElwee, Lauren A, NP Taking Active   fluticasone (FLONASE) 50 MCG/ACT nasal spray [id number] No Place 2 sprays into both nostrils daily. Charyl Dancer, NP Taking Active            Med Note [facility], Gwenette Greet A   Thu Jun 24, 2021  1:07 PM) Using PRN  hydrocortisone (ANUSOL-HC) 25 MG suppository [id number] No Place 1 suppository (25 mg total) rectally 2 (two) times daily. Johnson, Megan P, DO Taking Active   omeprazole (PRILOSEC OTC) 20 MG tablet [id number] No Take 20 mg by mouth daily. [provider] Taking Active            Med Note [facility] Aug 26, 2021  8:23 AM) prn  phenytoin (DILANTIN) 100 MG ER capsule [id number] No TAKE 1 CAPSULE(100 MG) BY MOUTH THREE TIMES DAILY [name], NP Taking Active             Patient Active Problem List   Diagnosis Date Noted   Folliculitis [id number]   Chronic pain syndrome 04/07/2021   Pharmacologic therapy 04/07/2021   Disorder of skeletal  system 04/07/2021   Problems influencing health status 04/07/2021   Arthritis of left knee 04/01/2021   Diarrhea 03/23/2021   Nausea and vomiting 03/23/2021   Impaired fasting glucose 12/31/2020   History of psychosis 12/01/2020   History of suicide attempt 12/01/2020   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   Bone lesion 02/28/2020   Tobacco use disorder 03/13/2016   Cannabis use disorder, moderate, dependence ([place]) 03/13/2016   Bipolar affective disorder, manic, severe, with psychotic behavior ([place]) 03/12/2016    Seizures ([place]) 03/11/2016   Involuntary commitment 03/11/2016   Syphilis, unspecified 08/29/1898    Conditions to be addressed/monitored per PCP order:   Chronic Lower Back Pain and Skin Issues  Care Plan : RN Care Manager Plan of Care  Updates made by Inge Rise, RN since 09/08/2021 12:00 AM     Problem: Chronic Disease Management and Care Coordination Needs for Chronic Pain & Skin Issues   Priority: High     Long-Range Goal: Development of Plan of Care for Chronic Disease Management and Care Coordination Needs (Chronic Pain and Skin Issues)   Start Date: 06/24/2021  Expected End Date: 10/22/2021  Priority: High  Note:   Current Barriers:  Knowledge Deficits related to plan of care for management of chronic low back pain and skin issues Care Coordination needs related to Financial constraints related to utilities and Housing barriers Chronic Disease Management support and education needs related to chronic low back pain and skin issues. Financial Constraints No Advanced Directives in place  RNCM Clinical Goal(s): New Goals Patient will verbalize understanding of plan for management of chronic low back pain and skin issues. verbalize basic understanding of  chronic low back pain and skin issues disease process and self health management plan   take all medications exactly as prescribed and will call provider for medication related questions attend all scheduled medical appointments: No future appointments scheduled at this time. demonstrate Ongoing adherence to prescribed treatment plan for chronic low back pain and skin issues as evidenced by reduced pain on pain scale and improvement of skin issues. demonstrate Ongoing health management independence   continue to work with RN Care Manager to address care management and care coordination needs related to  Chronic low back pain and skin issues work with Education officer, museum to address  related to the management of Financial  constraints related to utilities and Housing barriers related to the management of chronic low back pain and skin issues  through collaboration with Consulting civil engineer, provider, and care team.   Interventions: Inter-disciplinary care team collaboration (see longitudinal plan of care) Evaluation of current treatment plan related to  self management and patient's adherence to plan as established by provider 07/05/21: BSW contacted patient regarding financial assistance. Patient stated she needs assistance with utility bill that is $2,000 and she has a cut off notice for 11/29. Patient stated she has already reached out to [facility] and have not heard anything back. Patient asked for resources to be sent to her via text message. Patient stated she would also like a new PCP, patient feels as though the office has her best interest at heart. Patient also stated she would like to move to [facility] due to her rent being increased to 845 monthly and she only gets 841 in disability each month. 07/09/21: BSW researched resources for patient for loans, [employer] in [place], affordable apartments in [place] and [employer] in [place] for patient to outreach. Patient stated that  [employer] did give her a call back and will assist her with $600 for her utility bill.  07/29/21: Patient stated she still wants to go to a new doctor because she is still in pain. Patient states she has called [employer] in [place] and in [place] and they are not taking applications. Patient stated there is a govenerment program that assists with rent and she would give them a call. BSW asked patient about transportation to her appointment on 12/6 she stated she does not have transportation, BSW contacted [employer] to schedule transportation. [employer]'s transportation confirmation number is [phone] and they will be to pick patient up at 1:00pm. BSW contacted patient and informed her of pick up time. 08/31/20: BSW contacted patient to  complete follow up. Patient stated she did not receive her daughters disability this month because of an appeal that is needed. Patient stated that they are trying to say that patient daughter is no longer disabled and can worked. Patient states she has not been able to pay her rent due to not receiving the disability check. Patient stated she did pay 200 and is in need of 650 for the remainder. Patient stated she has surgery next week and she would like for her rent to be paid before she goes. BSW provided patient with the phone number for [place], [employer], and [employer].  Pain Interventions: On going Goal (progressing - Not on Track) Pain assessment performed. Patient reports continued chronic lower back pain "10" on a pain scale of 1 - 10.  Chronic pain is relieved temporarily with use of hot bath and heating blanket after bath.  Medications reviewed Reviewed provider established plan for pain management; Discussed importance of adherence to all scheduled medical appointments; Counseled on the importance of reporting any/all new or changed pain symptoms or management strategies to pain management provider; Advised patient to report to care team affect of pain on daily activities; Reviewed with patient prescribed pharmacological and nonpharmacological pain relief strategies; patient continues to use of her heating blanket along with use of hot baths with Epsom Salt to help relieve pain.  Skin Issues: Bilateral leg abscesses  Goal on track:  Yes. Evaluation of current treatment plan related to  bilateral leg abscesses ,  self-management and patient's adherence to plan as established by provider.  Patient reports there are no abscesses currently.  Patient attended attending new patient visit with surgeon on 09/07/2021 to address recurrent abscesses.  There was no evidence of active disease at yesterday's appointment.  Surgeon recommended patient see a dermatologist in the  future when abscesses reoccur. Discussed plans with patient for ongoing care management follow up and provided patient with direct contact information for care management team Evaluation of current treatment plan related to skin issues and patient's adherence to plan as established by provider; Reviewed medications with patient and discussed need to discuss with PCP the idea of referral to specialist to address reoccurring abscesses; Social Work referral for financial assistance with utilities and rent;  This [place] placed a phone call and spoke with [facility] BSW, Mickel Fuchs, to inform her that patient has increased stress due to inability to pay this month's rent.  Patient has attempted to contact all community resources for financial assistance and has not been successful in getting assistance.  I asked Ubaldo Glassing to contact the patient today, if possible, since patient was very upset about losing her housing.  Patient said this was very urgent and  had until Friday 09/10/2021 to work out a plan for rent payment.  [facility] BSW will try to contact patient today.  Patient Goals/Self-Care Activities: Patient will self administer medications as prescribed Patient will attend all scheduled provider appointments Patient will call pharmacy for medication refills Patient will continue to perform ADL's independently Patient will continue to perform IADL's independently Patient will call provider office for new concerns or questions Patient will work with BSW to address care coordination needs and will continue to work with the clinical team to address health care and disease management related needs.    Follow Up Plan:  Telephone follow up appointment with care management team member scheduled for:  10/05/2021 at 10:00 AM The patient has been provided with contact information for the care management team and has been advised to call with any health related questions or concerns.  The care management team will  reach out to the patient again over the next 30 days.      Follow Up:  Patient agrees to Care Plan and Follow-up.  Plan: The Managed Medicaid care management team will reach out to the patient again over the next 30 days.  Date/time of next scheduled RN care management/care coordination outreach:  10/05/2021 at 10:00 AM  [place], [facility] Mobile: [phone]

## 2021-09-08 NOTE — Patient Outreach (Signed)
Care Coordination  09/08/2021  [name] Centola April 18, 1971 [id number]  BSW contacted patient to follow up with her. Patient states she is frustrated because she has contacted a lot of resources and has asked family members to help with her rent and no one can help. She does have an appointment with [employer] tomorrow to discuss her daughter's disability. Patient states she has until Friday to get her rent paid before her landlord files an eviction. BSW contacted 4 churches in the area to see if they are offering any assistance and no one has funds.  Gus Puma, BSW, [facility]   [employer] Risk Managed Medicaid Team  [phone]

## 2021-09-22 ENCOUNTER — Other Ambulatory Visit: Payer: Self-pay

## 2021-09-22 ENCOUNTER — Encounter: Payer: Self-pay | Admitting: Internal Medicine

## 2021-09-22 ENCOUNTER — Ambulatory Visit (INDEPENDENT_AMBULATORY_CARE_PROVIDER_SITE_OTHER): Payer: Medicaid Other | Admitting: Internal Medicine

## 2021-09-22 VITALS — BP 122/80 | HR 100 | Temp 98.5°F | Ht 65.0 in | Wt 171.6 lb

## 2021-09-22 DIAGNOSIS — G8929 Other chronic pain: Secondary | ICD-10-CM | POA: Diagnosis not present

## 2021-09-22 DIAGNOSIS — M545 Low back pain, unspecified: Secondary | ICD-10-CM

## 2021-09-22 MED ORDER — POLYETHYLENE GLYCOL 3350 17 GM/SCOOP PO POWD: 17.0000 g | Freq: Two times a day (BID) | ORAL | 1 refills | Status: DC | PRN

## 2021-09-22 MED ORDER — DOCUSATE SODIUM 50 MG PO CAPS: 50.0000 mg | ORAL_CAPSULE | Freq: Two times a day (BID) | ORAL | 0 refills | Status: DC

## 2021-09-22 NOTE — Progress Notes (Signed)
BP 122/80    Pulse 100    Temp 98.5 F (36.9 C) (Oral)    Ht 5\' 5"  (1.651 m)    Wt 171 lb 9.6 oz (77.8 kg)    SpO2 98%    BMI 28.56 kg/m    Subjective:    Patient ID: , female    DOB: 08/13/1971, 51 y.o.   MRN: [id number]  Chief Complaint  Patient presents with   Constipation    For the past week patient states that she has had to take laxatives to help with bowel movements which are not really working.     Back Pain    Lower back pain    HPI: [id number] Karina Anderson is a 51 y.o. female  HPI  Chief Complaint  Patient presents with   Constipation    For the past week patient states that she has had to take laxatives to help with bowel movements which are not really working.     Back Pain    Lower back pain    Relevant past medical, surgical, family and social history reviewed and updated as indicated. Interim medical history since our last visit reviewed. Allergies and medications reviewed and updated.  Review of Systems  Per HPI unless specifically indicated above     Objective:    BP 122/80    Pulse 100    Temp 98.5 F (36.9 C) (Oral)    Ht 5\' 5"  (1.651 m)    Wt 171 lb 9.6 oz (77.8 kg)    SpO2 98%    BMI 28.56 kg/m   Wt Readings from Last 3 Encounters:  09/22/21 171 lb 9.6 oz (77.8 kg)  09/07/21 173 lb (78.5 kg)  08/26/21 171 lb (77.6 kg)    Physical Exam  Results for orders placed or performed in visit on 04/01/21  WET PREP FOR TRICH, YEAST, CLUE   Specimen: Sterile Swab   Sterile Swab  Result Value Ref Range   Trichomonas Exam Negative Negative   Yeast Exam Negative Negative   Clue Cell Exam Negative Negative  Chlamydia/GC NAA, Confirmation   Specimen: Urine   UR  Result Value Ref Range   Chlamydia trachomatis, NAA Negative Negative   Neisseria gonorrhoeae, NAA Negative Negative  Urinalysis, Routine w reflex microscopic  Result Value Ref Range   Specific Gravity, UA 1.025 1.005 - 1.030   pH, UA 5.5 5.0 - 7.5   Color, UA Yellow Yellow    Appearance Ur Cloudy (A) Clear   Leukocytes,UA Negative Negative   Protein,UA Trace (A) Negative/Trace   Glucose, UA Negative Negative   Ketones, UA Negative Negative   RBC, UA Negative Negative   Bilirubin, UA Negative Negative   Urobilinogen, Ur 0.2 0.2 - 1.0 mg/dL   Nitrite, UA Negative Negative        Current Outpatient Medications:    clotrimazole (LOTRIMIN) 1 % cream, Apply 1 application topically 2 (two) times daily., Disp: 30 g, Rfl: 0   cyclobenzaprine (FLEXERIL) 10 MG tablet, Take 1 tablet (10 mg total) by mouth 3 (three) times daily as needed for muscle spasms., Disp: 30 tablet, Rfl: 1   docusate sodium (COLACE) 50 MG capsule, Take 1 capsule (50 mg total) by mouth 2 (two) times daily., Disp: 10 capsule, Rfl: 0   fluticasone (FLONASE) 50 MCG/ACT nasal spray, Place 2 sprays into both nostrils daily., Disp: 16 g, Rfl: 6   hydrocortisone (ANUSOL-HC) 25 MG suppository, Place 1 suppository (25 mg total) rectally 2 (two) times  daily., Disp: 12 suppository, Rfl: 0   omeprazole (PRILOSEC OTC) 20 MG tablet, Take 20 mg by mouth daily., Disp: 28 tablet, Rfl: 1   phenytoin (DILANTIN) 100 MG ER capsule, TAKE 1 CAPSULE(100 MG) BY MOUTH THREE TIMES DAILY, Disp: 90 capsule, Rfl: 2   polyethylene glycol powder (GLYCOLAX/MIRALAX) 17 GM/SCOOP powder, Take 17 g by mouth 2 (two) times daily as needed., Disp: 3350 g, Rfl: 1    Assessment & Plan:  Drinks 2 cups of water a day Increase intake 8- 9 cups a day Consider  AXr patient was ADVISED TO start pt on miralax.  increase fiber in diet.  Increase vegetables in diet.    Problem List Items Addressed This Visit   None Visit Diagnoses     Chronic low back pain without sciatica, unspecified back pain laterality    -  Primary   Relevant Orders   Ambulatory referral to Orthopedics        Orders Placed This Encounter  Procedures   Ambulatory referral to Orthopedics     Meds ordered this encounter  Medications   docusate sodium (COLACE)  50 MG capsule    Sig: Take 1 capsule (50 mg total) by mouth 2 (two) times daily.    Dispense:  10 capsule    Refill:  0   polyethylene glycol powder (GLYCOLAX/MIRALAX) 17 GM/SCOOP powder    Sig: Take 17 g by mouth 2 (two) times daily as needed.    Dispense:  3350 g    Refill:  1     Follow up plan: Return in about 4 weeks (around 10/20/2021).

## 2021-10-01 ENCOUNTER — Other Ambulatory Visit: Payer: Self-pay

## 2021-10-01 NOTE — Patient Outreach (Signed)
Medicaid Managed Care Social Work Note  10/01/2021 Name:  Karina Anderson MRN:  [id number] DOB:  24-Jul-1971  Karina Anderson is an 51 y.o. year old female who is a primary patient of Jon Billings, NP.  The Medicaid Managed Care Coordination team was consulted for assistance with:  Community Resources   Ms. Landing was given information about [employer] team services today. [place] Patient agreed to services and verbal consent obtained.  Engaged with patient  for by telephone forfollow up visit in response to referral for case management and/or care coordination services.   Assessments/Interventions:  Review of past medical history, allergies, medications, health status, including review of consultants reports, laboratory and other test data, was performed as part of comprehensive evaluation and provision of chronic care management services.  SDOH: (Social Determinant of Health) assessments and interventions performed: BSW contacted patient for follow up. Patient stated she is not doing good because she is trying to figure out how she will pay the rest of her rent. Patient states she still needs $500. Patient asked BSW to look up renters stimulus package online, BSW was unable to locate anything for it. After researching BSW was unable to find any other rent resources besides the ones provided in January. Patient states she is still waiting on SSA to make a decision about her daughters disability. BSW reached out to [place] worker in [place] to inquire about rent assistance for patient and left a voicemail.  Advanced Directives Status:  Not addressed in this encounter.  Care Plan                 Allergies  Allergen Reactions   Citrus Hives   Sulfamethoxazole-Trimethoprim Hives   Butorphanol Hives   Butorphanol Tartrate Hives   Contrast Media [Iodinated Contrast Media] Nausea And Vomiting   Food Other (See Comments)    Allergic to Citrus Fruits.    Ibuprofen    Ketorolac    Lorazepam Hives   Naproxen    Prochlorperazine Hives   Nsaids Rash    Medications Reviewed Today     Reviewed by Charlynne Cousins, MD (Physician) on 09/22/21 at 1318  Med List Status: <None>   Medication Order Taking? Sig Documenting Provider Last Dose Status Informant  clotrimazole (LOTRIMIN) 1 % cream [id number] Yes Apply 1 application topically 2 (two) times daily. Charyl Dancer, NP Taking Active            Med Note [facility], MAUREEN A   Thu Jun 24, 2021  1:06 PM) Using PRN  cyclobenzaprine (FLEXERIL) 10 MG tablet [id number] Yes Take 1 tablet (10 mg total) by mouth 3 (three) times daily as needed for muscle spasms. McElwee, Lauren A, NP Taking Active   docusate sodium (COLACE) 50 MG capsule [id number] Yes Take 1 capsule (50 mg total) by mouth 2 (two) times daily. Vigg, Avanti, MD  Active   fluticasone (FLONASE) 50 MCG/ACT nasal spray [id number] Yes Place 2 sprays into both nostrils daily. Charyl Dancer, NP Taking Active            Med Note [facility], Gwenette Greet A   Thu Jun 24, 2021  1:07 PM) Using PRN  hydrocortisone (ANUSOL-HC) 25 MG suppository [id number] Yes Place 1 suppository (25 mg total) rectally 2 (two) times daily. Johnson, Megan P, DO Taking Active   omeprazole (PRILOSEC OTC) 20 MG tablet [id number] Yes Take 20 mg by mouth daily. [provider] Taking Active  Med Note Donnetta Simpers, CRISTINA   Thu Aug 26, 2021  8:23 AM) prn  phenytoin (DILANTIN) 100 MG ER capsule [id number] Yes TAKE 1 CAPSULE(100 MG) BY MOUTH THREE TIMES DAILY Cannady, Jolene T, NP Taking Active   polyethylene glycol powder (GLYCOLAX/MIRALAX) 17 GM/SCOOP powder [id number] Yes Take 17 g by mouth 2 (two) times daily as needed. 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   Bone lesion 02/28/2020   Tobacco use disorder 03/13/2016   Cannabis use disorder, moderate, dependence ([place]) 03/13/2016   Bipolar affective disorder, manic, severe, with psychotic behavior ([place]) 03/12/2016   Seizures ([place]) 03/11/2016   Involuntary commitment 03/11/2016   Syphilis, unspecified 08/29/1898    Conditions to be addressed/monitored per PCP order:   housing assitance  Care Plan : RN Care Manager Plan of Care  Updates made by Ethelda Chick since 10/01/2021 12:00 AM     Problem: Chronic Disease Management and Care Coordination Needs for Chronic Pain & Skin Issues   Priority: High     Long-Range Goal: Development of Plan of Care for Chronic Disease Management and Care Coordination Needs (Chronic Pain and Skin Issues)   Start Date: 06/24/2021  Expected End Date: 10/22/2021  Priority: High  Note:   Current Barriers:  Knowledge Deficits related to plan of care for management of chronic low back pain and skin issues Care Coordination needs related to Financial constraints related to utilities and Housing barriers Chronic Disease Management support and education needs related to chronic low back pain and skin issues. Financial Constraints No Advanced Directives in place  RNCM Clinical Goal(s): New Goals Patient will verbalize understanding of plan for management of chronic low back pain and skin issues. verbalize basic understanding of  chronic low back pain and skin issues disease process and self health management plan   take all medications exactly as prescribed and will call provider for medication related questions attend all scheduled medical appointments: No  future appointments scheduled at this time. demonstrate Ongoing adherence to prescribed treatment plan for chronic low back pain and skin issues as evidenced by reduced pain on pain scale and improvement of skin issues. demonstrate Ongoing health management independence   continue to work with RN Care Manager to address care management and care coordination needs related to  Chronic low back pain and skin issues work with Education officer, museum to address  related to the management of Financial constraints related to utilities and Housing barriers related to the management of chronic low back pain and skin issues  through collaboration with Consulting civil engineer, provider, and care team.   Interventions: Inter-disciplinary care team collaboration (see longitudinal plan of care) Evaluation of current treatment plan related to  self management and patient's adherence to plan as established by provider 07/05/21: BSW contacted patient regarding financial assistance. Patient stated she needs assistance with utility bill that is $2,000 and she has a cut off notice for 11/29. Patient stated she has already  reached out to [facility] and have not heard anything back. Patient asked for resources to be sent to her via text message. Patient stated she would also like a new PCP, patient feels as though the office has her best interest at heart. Patient also stated she would like to move to [facility] due to her rent being increased to 845 monthly and she only gets 841 in disability each month. 07/09/21: BSW researched resources for patient for loans, [employer] in [place], affordable apartments in [place] and [employer] in [place] for patient to outreach. Patient stated that DSS did give her a call back and will assist her with $600 for her utility bill.  07/29/21: Patient stated she still wants to go to a new doctor because she is still in pain. Patient states she has called [employer] manager in [place] and in [place] and they are not  taking applications. Patient stated there is a govenerment program that assists with rent and she would give them a call. BSW asked patient about transportation to her appointment on 12/6 she stated she does not have transportation, BSW contacted [employer] to schedule transportation. [employer]'s transportation confirmation number is [phone] and they will be to pick patient up at 1:00pm. BSW contacted patient and informed her of pick up time. 08/31/20: BSW contacted patient to complete follow up. Patient stated she did not receive her daughters disability this month because of an appeal that is needed. Patient stated that they are trying to say that patient daughter is no longer disabled and can worked. Patient states she has not been able to pay her rent due to not receiving the disability check. Patient stated she did pay 200 and is in need of 650 for the remainder. Patient stated she has surgery next week and she would like for her rent to be paid before she goes. BSW provided patient with the phone number for [place], [employer], and [employer]. 10/01/20: BSW contacted patient for follow up. Patient stated she is not doing good because she is trying to figure out how she will pay the rest of her rent. Patient states she still needs $500. Patient asked BSW to look up renters stimulus package online, BSW was unable to locate anything for it. After researching BSW was unable to find any other rent resources besides the ones provided in January. Patient states she is still waiting on SSA to make a decision about her daughters disability. BSW reached out to [place] worker in [place] to inquire about rent assistance for patient and left a voicemail.  Pain Interventions: On going Goal (progressing - Not on Track) Pain assessment performed. Patient reports continued chronic lower back pain "10" on a pain scale of 1 - 10.  Chronic pain is relieved temporarily with use of  hot bath and heating blanket after bath.  Medications reviewed Reviewed provider established plan for pain management; Discussed importance of adherence to all scheduled medical appointments; Counseled on the importance of reporting any/all new or changed pain symptoms or management strategies to pain management provider; Advised patient to report to care team affect of pain on daily activities; Reviewed with patient prescribed pharmacological and nonpharmacological pain relief strategies; patient continues to use of her heating blanket along with use of hot baths with Epsom Salt to help relieve pain.  Skin Issues: Bilateral leg abscesses  Goal on track:  Yes. Evaluation of current treatment plan related to  bilateral leg abscesses ,  self-management and patient's adherence to plan as established by provider.  Patient reports there are no abscesses currently.  Patient attended attending new patient visit with surgeon on 09/07/2021 to address recurrent abscesses.  There was no evidence of active disease at yesterday's appointment.  Surgeon recommended patient see a dermatologist in the future when abscesses reoccur. Discussed plans with patient for ongoing care management follow up and provided patient with direct contact information for care management team Evaluation of current treatment plan related to skin issues and patient's adherence to plan as established by provider; Reviewed medications with patient and discussed need to discuss with PCP the idea of referral to specialist to address reoccurring abscesses; Social Work referral for financial assistance with utilities and rent;  This [place] placed a phone call and spoke with [facility] BSW, Mickel Fuchs, to inform her that patient has increased stress due to inability to pay this month's rent.  Patient has attempted to contact all community resources for financial assistance and has not been successful in getting assistance.  I asked Ubaldo Glassing to  contact the patient today, if possible, since patient was very upset about losing her housing.  Patient said this was very urgent and had until Friday 09/10/2021 to work out a plan for rent payment.  [facility] BSW will try to contact patient today.  Patient Goals/Self-Care Activities: Patient will self administer medications as prescribed Patient will attend all scheduled provider appointments Patient will call pharmacy for medication refills Patient will continue to perform ADL's independently Patient will continue to perform IADL's independently Patient will call provider office for new concerns or questions Patient will work with BSW to address care coordination needs and will continue to work with the clinical team to address health care and disease management related needs.    Follow Up Plan:  Telephone follow up appointment with care management team member scheduled for:  10/05/2021 at 10:00 AM The patient has been provided with contact information for the care management team and has been advised to call with any health related questions or concerns.  The care management team will reach out to the patient again over the next 30 days.      Follow up:  Patient agrees to Care Plan and Follow-up.  Plan: The Managed Medicaid care management team will reach out to the patient again over the next 30-45 days.  Date/time of next scheduled Social Work care management/care coordination outreach:  11/05/21  Mickel Fuchs, Arita Miss, [place] Medicaid Team  [phone]

## 2021-10-01 NOTE — Patient Instructions (Signed)
Visit Information  Karina Anderson was given information about Medicaid Managed Care team care coordination services as a part of their [facility] Community Plan Medicaid benefit. Karina Anderson verbally consented to engagement with the [facility] team.   If you are experiencing a medical emergency, please call 911 or report to your local emergency department or urgent care.   If you have a non-emergency medical problem during routine business hours, please contact your provider's office and ask to speak with a nurse.   For questions related to your [facility] Community [facility] Medicaid, please call: [phone] or visit the homepage here: [email or web address]  If you would like to schedule transportation through your [facility] Community Plan Medicaid, please call the following number at least 2 days in advance of your appointment: [phone].   Call the Behavioral Health Crisis Line at [phone], at any time, 24 hours a day, 7 days a week. If you are in danger or need immediate medical attention call 911.  If you would like help to quit smoking, call 1-800-QUIT-NOW (734-761-47861-563-728-7995) OR Espaol: 1-855-Djelo-Ya (1-027-253-6644(1-647-456-7225) o para ms informacin haga clic aqu or Text READY to 034-742200-400 to register via text  Karina Anderson - following are the goals we discussed in your visit today:   Goals Addressed   None      Social Worker will follow up in 30-45 days .   Marland Kitchen. Karina PumaAlexis Twanisha Foulk, BSW, [place]   [place]  High Risk Managed Medicaid Team  [phone]   Following is a copy of your plan of care:  Care Plan : RN Care Manager Plan of Care  Updates made by Shaune LeeksShields, Syrus Nakama J since 10/01/2021 12:00 AM     Problem: Chronic Disease Management and Care Coordination Needs for Chronic Pain & Skin Issues   Priority: High     Long-Range Goal: Development of Plan of Care for Chronic Disease Management and  Care Coordination Needs (Chronic Pain and Skin Issues)   Start Date: 06/24/2021  Expected End Date: 10/22/2021  Priority: High  Note:   Current Barriers:  Knowledge Deficits related to plan of care for management of chronic low back pain and skin issues Care Coordination needs related to Financial constraints related to utilities and Housing barriers Chronic Disease Management support and education needs related to chronic low back pain and skin issues. Financial Constraints No Advanced Directives in place  RNCM Clinical Goal(s): New Goals Patient will verbalize understanding of plan for management of chronic low back pain and skin issues. verbalize basic understanding of  chronic low back pain and skin issues disease process and self health management plan   take all medications exactly as prescribed and will call provider for medication related questions attend all scheduled medical appointments: No future appointments scheduled at this time. demonstrate Ongoing adherence to prescribed treatment plan for chronic low back pain and skin issues as evidenced by reduced pain on pain scale and improvement of skin issues. demonstrate Ongoing health management independence   continue to work with RN Care Manager to address care management and care coordination needs related to  Chronic low back pain and skin issues work with Child psychotherapistsocial worker to address  related to the management of Financial constraints related to utilities and Housing barriers related to the management of chronic low back pain and skin issues  through collaboration with Medical illustratorN Care manager, provider, and care team.   Interventions: Inter-disciplinary care team collaboration (see longitudinal plan of care) Evaluation of current  treatment plan related to  self management and patient's adherence to plan as established by provider 07/05/21: BSW contacted patient regarding financial assistance. Patient stated she needs assistance with utility  bill that is $2,000 and she has a cut off notice for 11/29. Patient stated she has already reached out to [facility] and have not heard anything back. Patient asked for resources to be sent to her via text message. Patient stated she would also like a new PCP, patient feels as though the office has her best interest at heart. Patient also stated she would like to move to [facility] due to her rent being increased to 845 monthly and she only gets 841 in disability each month. 07/09/21: BSW researched resources for patient for loans, [employer] in [place], affordable apartments in [place] and [employer] in [place] for patient to outreach. Patient stated that DSS did give her a call back and will assist her with $600 for her utility bill.  07/29/21: Patient stated she still wants to go to a new doctor because she is still in pain. Patient states she has called Psychologist, occupational in [place] and in [place] and they are not taking applications. Patient stated there is a govenerment program that assists with rent and she would give them a call. BSW asked patient about transportation to her appointment on 12/6 she stated she does not have transportation, BSW contacted [employer] to schedule transportation. [employer]'s transportation confirmation number is [phone] and they will be to pick patient up at 1:00pm. BSW contacted patient and informed her of pick up time. 08/31/20: BSW contacted patient to complete follow up. Patient stated she did not receive her daughters disability this month because of an appeal that is needed. Patient stated that they are trying to say that patient daughter is no longer disabled and can worked. Patient states she has not been able to pay her rent due to not receiving the disability check. Patient stated she did pay 200 and is in need of 650 for the remainder. Patient stated she has surgery next week and she would like for her rent to be paid before she goes. BSW provided patient with the  phone number for [employer], [employer], and [employer]. 10/01/20: BSW contacted patient for follow up. Patient stated she is not doing good because she is trying to figure out how she will pay the rest of her rent. Patient states she still needs $500. Patient asked BSW to look up renters stimulus package online, BSW was unable to locate anything for it. After researching BSW was unable to find any other rent resources besides the ones provided in January. Patient states she is still waiting on SSA to make a decision about her daughters disability. BSW reached out to [employer] social worker in [place] to inquire about rent assistance for patient and left a voicemail.  Pain Interventions: On going Goal (progressing - Not on Track) Pain assessment performed. Patient reports continued chronic lower back pain "10" on a pain scale of 1 - 10.  Chronic pain is relieved temporarily with use of hot bath and heating blanket after bath.  Medications reviewed Reviewed provider established plan for pain management; Discussed importance of adherence to all scheduled medical appointments; Counseled on the importance of reporting any/all new or changed pain symptoms or management strategies to pain management provider; Advised patient to report to care team affect of pain on daily activities; Reviewed with patient prescribed pharmacological and nonpharmacological  pain relief strategies; patient continues to use of her heating blanket along with use of hot baths with Epsom Salt to help relieve pain.  Skin Issues: Bilateral leg abscesses  Goal on track:  Yes. Evaluation of current treatment plan related to  bilateral leg abscesses ,  self-management and patient's adherence to plan as established by provider.  Patient reports there are no abscesses currently.  Patient attended attending new patient visit with surgeon on 09/07/2021 to address recurrent abscesses.  There was no evidence of active  disease at yesterday's appointment.  Surgeon recommended patient see a dermatologist in the future when abscesses reoccur. Discussed plans with patient for ongoing care management follow up and provided patient with direct contact information for care management team Evaluation of current treatment plan related to skin issues and patient's adherence to plan as established by provider; Reviewed medications with patient and discussed need to discuss with PCP the idea of referral to specialist to address reoccurring abscesses; Social Work referral for financial assistance with utilities and rent;  This RN Care Manger placed a phone call and spoke with [facility] BSW, Karina Anderson, to inform her that patient has increased stress due to inability to pay this month's rent.  Patient has attempted to contact all community resources for financial assistance and has not been successful in getting assistance.  I asked Jon Gills to contact the patient today, if possible, since patient was very upset about losing her housing.  Patient said this was very urgent and had until Friday 09/10/2021 to work out a plan for rent payment.  [facility] BSW will try to contact patient today.  Patient Goals/Self-Care Activities: Patient will self administer medications as prescribed Patient will attend all scheduled provider appointments Patient will call pharmacy for medication refills Patient will continue to perform ADL's independently Patient will continue to perform IADL's independently Patient will call provider office for new concerns or questions Patient will work with BSW to address care coordination needs and will continue to work with the clinical team to address health care and disease management related needs.    Follow Up Plan:  Telephone follow up appointment with care management team member scheduled for:  10/05/2021 at 10:00 AM The patient has been provided with contact information for the care management team and has been  advised to call with any health related questions or concerns.  The care management team will reach out to the patient again over the next 30 days.

## 2021-10-05 ENCOUNTER — Other Ambulatory Visit: Payer: Self-pay

## 2021-10-05 NOTE — Patient Instructions (Signed)
[name] Furlough ,   [employer] is available to provide assistance to you with your healthcare needs at no cost and as a benefit of your [facility] Health plan.   I'm sorry I was unable to reach you today for our scheduled appointment. Our care guide will call you to reschedule our telephone appointment. Please call me at the number below. I am available to be of assistance to you regarding your healthcare needs. .   Thank you,   [name] RN, BSN [facility] Coordinator [facility]   [facility] Mobile: [phone]

## 2021-10-05 NOTE — Patient Outreach (Signed)
Care Coordination  10/05/2021  [name] 1971-05-15 [id number]  10/05/2021 Name: Karina Anderson MRN: [id number] DOB: [birth year]  Referred by: Larae Grooms, NP Reason for referral : High Risk Managed Medicaid (Unsuccessful Telephone Outreach)   An unsuccessful telephone outreach was attempted today. The patient was referred to the case management team for assistance with care management and care coordination.    Follow Up Plan: The Managed Medicaid care management team will reach out to the patient again over the next 7-14 days.    [name] RN, BSN Community Care Coordinator [facility]   [facility] Mobile: [phone]

## 2021-10-12 ENCOUNTER — Telehealth: Payer: Self-pay | Admitting: Internal Medicine

## 2021-10-12 NOTE — Telephone Encounter (Signed)
.. °  Medicaid Managed Care   Unsuccessful Outreach Note  10/12/2021 Name: Karina Anderson MRN: [id number] DOB: [birth year]  Referred by: Loura Pardon, MD Reason for referral : High Risk Managed Medicaid (I called the patient today to get her rescheduled with the MM RNCM. She did not answer and there was not a VM.)   An unsuccessful telephone outreach was attempted today. The patient was referred to the case management team for assistance with care management and care coordination.   Follow Up Plan: The care management team will reach out to the patient again over the next 7 days.   Weston Settle Care Guide, High Risk Medicaid Managed Care Embedded Care Coordination [facility]   [facility]

## 2021-10-18 ENCOUNTER — Telehealth: Payer: Self-pay | Admitting: Internal Medicine

## 2021-10-18 NOTE — Telephone Encounter (Signed)
.. °  Medicaid Managed Care   Unsuccessful Outreach Note  10/18/2021 Name: Karina Anderson MRN: [id number] DOB: [birth year]  Referred by: Loura Pardon, MD Reason for referral : High Risk Managed Medicaid (I attempted to reach the patient today to get her rescheduled with the MM RNCM. She did not answer and her VM is full.)   A second unsuccessful telephone outreach was attempted today. The patient was referred to the case management team for assistance with care management and care coordination.   Follow Up Plan: The care management team will reach out to the patient again over the next 7 days.    Weston Settle Care Guide, High Risk Medicaid Managed Care Embedded Care Coordination [facility]   [facility]

## 2021-10-20 ENCOUNTER — Ambulatory Visit: Payer: Medicaid Other | Admitting: Internal Medicine

## 2021-10-21 ENCOUNTER — Other Ambulatory Visit: Payer: Self-pay

## 2021-10-21 NOTE — Patient Outreach (Signed)
Care Coordination  10/21/2021  Karina Anderson Apr 12, 1971 [id number]  10/21/2021 Name: Karina Anderson MRN: [id number] DOB: 1970-12-29  Referred by: Loura Pardon, MD Reason for referral : High Risk Managed Medicaid (RNCM Notification of Case Closure due to inability to maintain contact with patient.)   Third unsuccessful telephone outreach was attempted today. The patient was referred to the case management team for assistance with care management and care coordination. The patient's primary care provider has been notified of our unsuccessful attempts to make or maintain contact with the patient. The care management team is pleased to engage with this patient at any time in the future should he/she be interested in assistance from the care management team.   To clarify, the third unsuccessful telephone outreach occurred on 10/18/2021 by Weston Settle and not today as stated above. The Case Closure is effective today 10/21/2021.  Follow Up Plan: The Managed Medicaid care management team is available to follow up with the patient after provider conversation with the patient regarding recommendation for care management engagement and subsequent re-referral to the care management team.     [name] RN, BSN [facility] Coordinator [facility]   [facility] Mobile: [phone]

## 2021-10-21 NOTE — Patient Instructions (Signed)
Karina Anderson ,   [employer] is available to provide assistance to you with your healthcare needs at no cost and as a benefit of your [facility] Health plan.   We have been unable to reach you on 3 separate attempts. This is notification of the Case Closure due to inability to maintain contact with you.  The Case Closure is effective today 10/21/2021.  The care management team is available to assist with your healthcare needs at any time. Please do not hesitate to contact me at the number below. .   Thank you,   [name] RN, BSN [facility] Coordinator [facility]   [facility] Mobile: [phone]

## 2021-10-25 ENCOUNTER — Emergency Department: Payer: Medicaid Other

## 2021-10-25 ENCOUNTER — Other Ambulatory Visit: Payer: Self-pay

## 2021-10-25 ENCOUNTER — Emergency Department
Admission: EM | Admit: 2021-10-25 | Discharge: 2021-10-25 | Disposition: A | Discharge status: Home / Self Care | Payer: Medicaid Other | Attending: Emergency Medicine | Admitting: Emergency Medicine

## 2021-10-25 DIAGNOSIS — R109 Unspecified abdominal pain: Secondary | ICD-10-CM | POA: Diagnosis not present

## 2021-10-25 DIAGNOSIS — Z9851 Tubal ligation status: Secondary | ICD-10-CM | POA: Diagnosis not present

## 2021-10-25 LAB — CBC WITH DIFFERENTIAL/PLATELET
Abs Immature Granulocytes: 0.02 10*3/uL (ref 0.00–0.07)
Basophils Absolute: 0 10*3/uL (ref 0.0–0.1)
Basophils Relative: 1 %
Eosinophils Absolute: 0.1 10*3/uL (ref 0.0–0.5)
Eosinophils Relative: 3 %
HCT: 37.2 % (ref 36.0–46.0)
Hemoglobin: 11.8 g/dL — ABNORMAL LOW (ref 12.0–15.0)
Immature Granulocytes: 0 %
Lymphocytes Relative: 34 %
Lymphs Abs: 1.6 10*3/uL (ref 0.7–4.0)
MCH: 26.9 pg (ref 26.0–34.0)
MCHC: 31.7 g/dL (ref 30.0–36.0)
MCV: 84.9 fL (ref 80.0–100.0)
Monocytes Absolute: 0.4 10*3/uL (ref 0.1–1.0)
Monocytes Relative: 8 %
Neutro Abs: 2.6 10*3/uL (ref 1.7–7.7)
Neutrophils Relative %: 54 %
Platelets: 326 10*3/uL (ref 150–400)
RBC: 4.38 MIL/uL (ref 3.87–5.11)
RDW: 15.4 % (ref 11.5–15.5)
WBC: 4.8 10*3/uL (ref 4.0–10.5)
nRBC: 0 % (ref 0.0–0.2)

## 2021-10-25 LAB — BASIC METABOLIC PANEL
Anion gap: 6 (ref 5–15)
BUN: 17 mg/dL (ref 6–20)
CO2: 28 mmol/L (ref 22–32)
Calcium: 9.2 mg/dL (ref 8.9–10.3)
Chloride: 102 mmol/L (ref 98–111)
Creatinine, Ser: 0.83 mg/dL (ref 0.44–1.00)
GFR, Estimated: >60 mL/min (ref >60)
Glucose, Bld: 87 mg/dL (ref 70–99)
Potassium: 3.4 mmol/L — ABNORMAL LOW (ref 3.5–5.1)
Sodium: 136 mmol/L (ref 135–145)

## 2021-10-25 LAB — URINALYSIS, ROUTINE W REFLEX MICROSCOPIC
Bacteria, UA: NONE SEEN
Bilirubin Urine: NEGATIVE
Glucose, UA: NEGATIVE mg/dL
Hgb urine dipstick: NEGATIVE
Ketones, ur: NEGATIVE mg/dL
Nitrite: NEGATIVE
Protein, ur: NEGATIVE mg/dL
Specific Gravity, Urine: 1.027 (ref 1.005–1.030)
pH: 6 (ref 5.0–8.0)

## 2021-10-25 LAB — LIPASE, BLOOD: Lipase: 22 U/L (ref 11–51)

## 2021-10-25 LAB — POC URINE PREG, ED: Preg Test, Ur: NEGATIVE

## 2021-10-25 MED ORDER — TRAMADOL HCL 50 MG PO TABS: 50.0000 mg | ORAL_TABLET | Freq: Four times a day (QID) | ORAL | 0 refills | Status: DC | PRN

## 2021-10-25 MED ORDER — MORPHINE SULFATE (PF) 2 MG/ML IV SOLN
2.0000 mg | Freq: Once | INTRAVENOUS | Status: AC
  Administered 2021-10-25: 2 mg via INTRAVENOUS
  Filled 2021-10-25: qty 1

## 2021-10-25 MED ORDER — ONDANSETRON HCL 4 MG/2ML IJ SOLN
4.0000 mg | Freq: Once | INTRAMUSCULAR | Status: AC
  Administered 2021-10-25: 4 mg via INTRAVENOUS
  Filled 2021-10-25: qty 2

## 2021-10-25 NOTE — ED Provider Notes (Signed)
[facility] Provider Note    Event Date/Time   First MD Initiated Contact with Patient 10/25/21 [phone]     (approximate)   History   Flank Pain   HPI  Karina Anderson is a 51 y.o. female with a history of bipolar disorder, seizures who presents with complaints of left flank pain.  She reports is been intermittent over the last 2 weeks, seems to be worse today she describes it as a sharp pain today.  She does report history of kidney stones and is unsure if this feels similar.  No dysuria.  No fevers chills or cough     Physical Exam   Triage Vital Signs: ED Triage Vitals [10/25/21 0925]  Enc Vitals Group     BP (!) 140/104     Pulse Rate 97     Resp 14     Temp 98.6 F (37 C)     Temp Source Oral     SpO2 96 %     Weight 79.4 kg (175 lb)     Height 1.651 m (5\' 5" )     Head Circumference      Peak Flow      Pain Score 10     Pain Loc      Pain Edu?      Excl. in [place]?     Most recent vital signs: Vitals:   10/25/21 1006 10/25/21 1200  BP: (!) 159/111 (!) 153/99  Pulse: 89 80  Resp: 16 18  Temp:    SpO2: 99% 98%     General: Awake, no distress.  CV:  Good peripheral perfusion.  Resp:  Normal effort.  Abd:  No distention.  No significant CVA tenderness, no tenderness palpation of the abdomen, overall reassuring exam Other:     ED Results / Procedures / Treatments   Labs (all labs ordered are listed, but only abnormal results are displayed) Labs Reviewed  URINALYSIS, ROUTINE W REFLEX MICROSCOPIC - Abnormal; Notable for the following components:      Result Value   Color, Urine YELLOW (*)    APPearance HAZY (*)    Leukocytes,Ua TRACE (*)    All other components within normal limits  CBC WITH DIFFERENTIAL/PLATELET - Abnormal; Notable for the following components:   Hemoglobin 11.8 (*)    All other components within normal limits  BASIC METABOLIC PANEL - Abnormal; Notable for the following components:   Potassium 3.4 (*)    All  other components within normal limits  LIPASE, BLOOD  POC URINE PREG, ED     EKG    RADIOLOGY CT reviewed by me, no acute abnormality, pending radiology read    PROCEDURES:  Critical Care performed:   Procedures   MEDICATIONS ORDERED IN ED: Medications  morphine (PF) 2 MG/ML injection 2 mg (2 mg Intravenous Given 10/25/21 1009)  ondansetron (ZOFRAN) injection 4 mg (4 mg Intravenous Given 10/25/21 1008)     IMPRESSION / MDM / [place] / ED COURSE  I reviewed the triage vital signs and the nursing notes.    Patient presents with left-sided flank pain as detailed above, differential includes ureterolithiasis, pyelonephritis/UTI.  Musculoskeletal pain, colitis  We will treat with IV morphine, IV Zofran  We will send for CT abdomen pelvis  Lab work is reassuring, unremarkable BMP CBC  Urinalysis not consistent with UTI  CT scan does not demonstrate any acute abnormalities, patient is feeling improved after treatment, appropriate for discharge with outpatient follow-up, return  precautions discussed if any worsening        FINAL CLINICAL IMPRESSION(S) / ED DIAGNOSES   Final diagnoses:  Flank pain     Rx / DC Orders   ED Discharge Orders          Ordered    traMADol (ULTRAM) 50 MG tablet  Every 6 hours PRN        10/25/21 1218             Note:  This document was prepared using Dragon voice recognition software and may include unintentional dictation errors.   Lavonia Drafts, MD 10/25/21 [phone]

## 2021-10-25 NOTE — ED Notes (Signed)
Pt to ED for L flank pain, intermittent since 2 weeks ago, worsened today, pain sharp 10/10, hx kidney stones, denies hematuria, denies NVD.

## 2021-10-25 NOTE — ED Triage Notes (Signed)
Reports left sided flank pain that started 2 weeks ago, increasing today. Feels similar to previous kidney stones. Reports urinary frequency.

## 2021-10-25 NOTE — ED Notes (Signed)
Pt to CT

## 2021-10-26 ENCOUNTER — Telehealth: Payer: Self-pay

## 2021-10-26 NOTE — Telephone Encounter (Signed)
Transition Care Management Follow-up Telephone Call Date of discharge and from where: 10/25/2021-[facility] How have you been since you were released from the hospital? Pt stated she is doing fine since her release.  Any questions or concerns? No  Items Reviewed: Did the pt receive and understand the discharge instructions provided? Yes  Medications obtained and verified? Yes  Other? No  Any new allergies since your discharge? No  Dietary orders reviewed? No Do you have support at home? Yes   Home Care and Equipment/Supplies: Were home health services ordered? not applicable If so, what is the name of the agency? N/A  Has the agency set up a time to come to the patient's home? not applicable Were any new equipment or medical supplies ordered?  No What is the name of the medical supply agency? N/A Were you able to get the supplies/equipment? not applicable Do you have any questions related to the use of the equipment or supplies? No  Functional Questionnaire: (I = Independent and D = Dependent) ADLs: I  Bathing/Dressing- I  Meal Prep- I  Eating- I  Maintaining continence- I  Transferring/Ambulation- I  Managing Meds- I  Follow up appointments reviewed:  PCP Hospital f/u appt confirmed? No   Specialist Hospital f/u appt confirmed? No   Are transportation arrangements needed? No  If their condition worsens, is the pt aware to call PCP or go to the Emergency Dept.? Yes Was the patient provided with contact information for the PCP's office or ED? Yes Was to pt encouraged to call back with questions or concerns? Yes

## 2021-11-05 ENCOUNTER — Other Ambulatory Visit: Payer: Self-pay

## 2021-11-05 NOTE — Patient Outreach (Signed)
Care Coordination ? ?11/05/2021 ? ?[name] Hertenstein ?March 23, 1971 ?[id number] ? ?Patient's MM case has been closed. ? ? ?Gus Puma, BSW, [place] ?[facility]  ?High Risk Managed Medicaid Team  ?[phone]  ?

## 2021-11-08 ENCOUNTER — Encounter: Payer: Self-pay | Admitting: Internal Medicine

## 2021-11-08 ENCOUNTER — Ambulatory Visit (INDEPENDENT_AMBULATORY_CARE_PROVIDER_SITE_OTHER): Payer: Medicaid Other | Admitting: Internal Medicine

## 2021-11-08 ENCOUNTER — Other Ambulatory Visit: Payer: Self-pay

## 2021-11-08 ENCOUNTER — Ambulatory Visit: Payer: Self-pay | Admitting: *Deleted

## 2021-11-08 VITALS — BP 145/99 | HR 109 | Temp 98.4°F | Ht 65.0 in | Wt 169.0 lb

## 2021-11-08 DIAGNOSIS — G894 Chronic pain syndrome: Secondary | ICD-10-CM | POA: Diagnosis not present

## 2021-11-08 DIAGNOSIS — G8929 Other chronic pain: Secondary | ICD-10-CM | POA: Diagnosis not present

## 2021-11-08 DIAGNOSIS — M549 Dorsalgia, unspecified: Secondary | ICD-10-CM | POA: Diagnosis not present

## 2021-11-08 MED ORDER — METHYLPREDNISOLONE 4 MG PO TBPK: ORAL_TABLET | ORAL | 0 refills | Status: DC

## 2021-11-08 MED ORDER — METHYLPREDNISOLONE SODIUM SUCC 40 MG IJ SOLR
120.0000 mg | Freq: Once | INTRAMUSCULAR | Status: AC
  Administered 2021-11-08: 120 mg via INTRAMUSCULAR

## 2021-11-08 NOTE — Progress Notes (Signed)
BP (!) 145/99   Pulse (!) 109   Temp 98.4 F (36.9 C) (Oral)   Ht 5\' 5"  (1.651 m)   Wt 169 lb (76.7 kg)   LMP 10/11/2021 (Approximate)   SpO2 96%   BMI 28.12 kg/m    Subjective:    Patient ID: [employer], female    DOB: 1971/05/04, 51 y.o.   MRN: [id number]  Chief Complaint  Patient presents with   Back Pain    Low back pain for past 4 days    HPI: Karina Anderson is a 51 y.o. female  Back Pain This is a chronic (started hurting all of a sudden) problem. The current episode started in the past 7 days.   Chief Complaint  Patient presents with   Back Pain    Low back pain for past 4 days    Relevant past medical, surgical, family and social history reviewed and updated as indicated. Interim medical history since our last visit reviewed. Allergies and medications reviewed and updated.  Review of Systems  Musculoskeletal:  Positive for back pain.   Per HPI unless specifically indicated above     Objective:    BP (!) 145/99   Pulse (!) 109   Temp 98.4 F (36.9 C) (Oral)   Ht 5\' 5"  (1.651 m)   Wt 169 lb (76.7 kg)   LMP 10/11/2021 (Approximate)   SpO2 96%   BMI 28.12 kg/m   Wt Readings from Last 3 Encounters:  11/08/21 169 lb (76.7 kg)  10/25/21 175 lb (79.4 kg)  09/22/21 171 lb 9.6 oz (77.8 kg)    Physical Exam  Results for orders placed or performed during the hospital encounter of 10/25/21  Urinalysis, Routine w reflex microscopic Urine, Clean Catch  Result Value Ref Range   Color, Urine YELLOW (A) YELLOW   APPearance HAZY (A) CLEAR   Specific Gravity, Urine 1.027 1.005 - 1.030   pH 6.0 5.0 - 8.0   Glucose, UA NEGATIVE NEGATIVE mg/dL   Hgb urine dipstick NEGATIVE NEGATIVE   Bilirubin Urine NEGATIVE NEGATIVE   Ketones, ur NEGATIVE NEGATIVE mg/dL   Protein, ur NEGATIVE NEGATIVE mg/dL   Nitrite NEGATIVE NEGATIVE   Leukocytes,Ua TRACE (A) NEGATIVE   RBC / HPF 0-5 0 - 5 RBC/hpf   WBC, UA 0-5 0 - 5 WBC/hpf   Bacteria, UA NONE SEEN NONE SEEN    Squamous Epithelial / LPF 6-10 0 - 5   Mucus PRESENT   Lipase, blood  Result Value Ref Range   Lipase 22 11 - 51 U/L  CBC with Differential  Result Value Ref Range   WBC 4.8 4.0 - 10.5 K/uL   RBC 4.38 3.87 - 5.11 MIL/uL   Hemoglobin 11.8 (L) 12.0 - 15.0 g/dL   HCT 16.6 06.3 - 01.6 %   MCV 84.9 80.0 - 100.0 fL   MCH 26.9 26.0 - 34.0 pg   MCHC 31.7 30.0 - 36.0 g/dL   RDW 01.0 93.2 - 35.5 %   Platelets 326 150 - 400 K/uL   nRBC 0.0 0.0 - 0.2 %   Neutrophils Relative % 54 %   Neutro Abs 2.6 1.7 - 7.7 K/uL   Lymphocytes Relative 34 %   Lymphs Abs 1.6 0.7 - 4.0 K/uL   Monocytes Relative 8 %   Monocytes Absolute 0.4 0.1 - 1.0 K/uL   Eosinophils Relative 3 %   Eosinophils Absolute 0.1 0.0 - 0.5 K/uL   Basophils Relative 1 %   Basophils Absolute  0.0 0.0 - 0.1 K/uL   Immature Granulocytes 0 %   Abs Immature Granulocytes 0.02 0.00 - 0.07 K/uL  Basic metabolic panel  Result Value Ref Range   Sodium 136 135 - 145 mmol/L   Potassium 3.4 (L) 3.5 - 5.1 mmol/L   Chloride 102 98 - 111 mmol/L   CO2 28 22 - 32 mmol/L   Glucose, Bld 87 70 - 99 mg/dL   BUN 17 6 - 20 mg/dL   Creatinine, Ser 0.27 0.44 - 1.00 mg/dL   Calcium 9.2 8.9 - 25.3 mg/dL   GFR, Estimated >66 >44 mL/min   Anion gap 6 5 - 15  POC urine preg, ED  Result Value Ref Range   Preg Test, Ur NEGATIVE NEGATIVE        Current Outpatient Medications:    clotrimazole (LOTRIMIN) 1 % cream, Apply 1 application topically 2 (two) times daily., Disp: 30 g, Rfl: 0   cyclobenzaprine (FLEXERIL) 10 MG tablet, Take 1 tablet (10 mg total) by mouth 3 (three) times daily as needed for muscle spasms., Disp: 30 tablet, Rfl: 1   docusate sodium (COLACE) 50 MG capsule, Take 1 capsule (50 mg total) by mouth 2 (two) times daily., Disp: 10 capsule, Rfl: 0   fluticasone (FLONASE) 50 MCG/ACT nasal spray, Place 2 sprays into both nostrils daily., Disp: 16 g, Rfl: 6   hydrocortisone (ANUSOL-HC) 25 MG suppository, Place 1 suppository (25 mg total)  rectally 2 (two) times daily., Disp: 12 suppository, Rfl: 0   methylPREDNISolone (MEDROL DOSEPAK) 4 MG TBPK tablet, Use as directed, Disp: 1 each, Rfl: 0   omeprazole (PRILOSEC OTC) 20 MG tablet, Take 20 mg by mouth daily., Disp: 28 tablet, Rfl: 1   phenytoin (DILANTIN) 100 MG ER capsule, TAKE 1 CAPSULE(100 MG) BY MOUTH THREE TIMES DAILY, Disp: 90 capsule, Rfl: 2   polyethylene glycol powder (GLYCOLAX/MIRALAX) 17 GM/SCOOP powder, Take 17 g by mouth 2 (two) times daily as needed., Disp: 3350 g, Rfl: 1   traMADol (ULTRAM) 50 MG tablet, Take 1 tablet (50 mg total) by mouth every 6 (six) hours as needed., Disp: 20 tablet, Rfl: 0    Assessment & Plan:  Back pain: Pt is allergic to toradol. Will need to administer a  solumedrol shot for pt  medrol dose pak sent to the pharmacy Neds to see ortho pt verbalized understanding. Problem List Items Addressed This Visit       Other   Chronic pain syndrome - Primary (Chronic)   Relevant Medications   methylPREDNISolone (MEDROL DOSEPAK) 4 MG TBPK tablet   Chronic back pain   Relevant Medications   methylPREDNISolone (MEDROL DOSEPAK) 4 MG TBPK tablet     No orders of the defined types were placed in this encounter.    Meds ordered this encounter  Medications   methylPREDNISolone (MEDROL DOSEPAK) 4 MG TBPK tablet    Sig: Use as directed    Dispense:  1 each    Refill:  0   methylPREDNISolone sodium succinate (SOLU-MEDROL) 40 mg/mL injection 120 mg     Follow up plan: Return in about 4 months (around 03/10/2022).

## 2021-11-08 NOTE — Telephone Encounter (Signed)
?  Summary: advice - back pain  ? Pt called in stating she has had back pain for the past 4 days, pt wanted to speak with a nurse.   ?  ? ? ? ?Chief Complaint: severe back pain requesting medication or treatment  ?Symptoms: low back pain  ?Frequency: 4 days  ?Pertinent Negatives: Patient denies inability to walk, radiating down legs, no numbness or tingling noted. No loss of B/B ?Disposition: [] ED /[] Urgent Care (no appt availability in office) / [x] Appointment(In office/virtual)/ []  [place] [facility]/ [] Home Care/ [] Refused Recommended Disposition /[] [place] [facility]/ []  Follow-up with PCP ?Additional Notes:  ? ?Scheduled appt today . Tylenol and heating pad no longer helping with pain  ? ? ?Reason for Disposition ? [1] SEVERE back pain (e.g., excruciating, unable to do any normal activities) AND [2] not improved 2 hours after pain medicine ? ?Answer Assessment - Initial Assessment Questions ?1. ONSET: "When did the pain begin?"  ?    4 days ago  ?2. LOCATION: "Where does it hurt?" (upper, mid or lower back) ?    Low back  ?3. SEVERITY: "How bad is the pain?"  (e.g., Scale 1-10; mild, moderate, or severe) ?  - MILD (1-3): doesn't interfere with normal activities  ?  - MODERATE (4-7): interferes with normal activities or awakens from sleep  ?  - SEVERE (8-10): excruciating pain, unable to do any normal activities  ?    Moderate to severe ?4. PATTERN: "Is the pain constant?" (e.g., yes, no; constant, intermittent)  ?    Yes  unless using heating pad ?5. RADIATION: "Does the pain shoot into your legs or elsewhere?" ?    No  ?6. CAUSE:  "What do you think is causing the back pain?"  ?    Not sure  ?7. BACK OVERUSE:  "Any recent lifting of heavy objects, strenuous work or exercise?" ?    No  ?8. MEDICATIONS: "What have you taken so far for the pain?" (e.g., nothing, acetaminophen, NSAIDS) ?    Tylenol and heating with minimal relief  ?9. NEUROLOGIC SYMPTOMS: "Do you have any weakness, numbness, or problems  with bowel/bladder control?" ?    No  ?10. OTHER SYMPTOMS: "Do you have any other symptoms?" (e.g., fever, abdominal pain, burning with urination, blood in urine) ?      No  ?11. PREGNANCY: "Is there any chance you are pregnant?" (e.g., yes, no; LMP) ?      na ? ?Protocols used: Back Pain-A-AH ? ?

## 2021-11-12 DIAGNOSIS — M545 Low back pain, unspecified: Secondary | ICD-10-CM | POA: Diagnosis not present

## 2021-11-22 ENCOUNTER — Ambulatory Visit: Payer: Medicaid Other | Admitting: Gastroenterology

## 2021-11-22 ENCOUNTER — Encounter: Payer: Self-pay | Admitting: Gastroenterology

## 2021-12-13 DIAGNOSIS — F321 Major depressive disorder, single episode, moderate: Secondary | ICD-10-CM | POA: Diagnosis not present

## 2021-12-14 DIAGNOSIS — F321 Major depressive disorder, single episode, moderate: Secondary | ICD-10-CM | POA: Diagnosis not present

## 2021-12-15 DIAGNOSIS — F321 Major depressive disorder, single episode, moderate: Secondary | ICD-10-CM | POA: Diagnosis not present

## 2021-12-16 DIAGNOSIS — F321 Major depressive disorder, single episode, moderate: Secondary | ICD-10-CM | POA: Diagnosis not present

## 2021-12-17 DIAGNOSIS — F321 Major depressive disorder, single episode, moderate: Secondary | ICD-10-CM | POA: Diagnosis not present

## 2021-12-18 DIAGNOSIS — F321 Major depressive disorder, single episode, moderate: Secondary | ICD-10-CM | POA: Diagnosis not present

## 2021-12-20 DIAGNOSIS — F321 Major depressive disorder, single episode, moderate: Secondary | ICD-10-CM | POA: Diagnosis not present

## 2021-12-21 DIAGNOSIS — F321 Major depressive disorder, single episode, moderate: Secondary | ICD-10-CM | POA: Diagnosis not present

## 2021-12-22 DIAGNOSIS — F321 Major depressive disorder, single episode, moderate: Secondary | ICD-10-CM | POA: Diagnosis not present

## 2021-12-23 DIAGNOSIS — F321 Major depressive disorder, single episode, moderate: Secondary | ICD-10-CM | POA: Diagnosis not present

## 2021-12-24 DIAGNOSIS — F321 Major depressive disorder, single episode, moderate: Secondary | ICD-10-CM | POA: Diagnosis not present

## 2021-12-25 DIAGNOSIS — F321 Major depressive disorder, single episode, moderate: Secondary | ICD-10-CM | POA: Diagnosis not present

## 2021-12-27 DIAGNOSIS — F321 Major depressive disorder, single episode, moderate: Secondary | ICD-10-CM | POA: Diagnosis not present

## 2021-12-28 DIAGNOSIS — F321 Major depressive disorder, single episode, moderate: Secondary | ICD-10-CM | POA: Diagnosis not present

## 2021-12-29 DIAGNOSIS — F321 Major depressive disorder, single episode, moderate: Secondary | ICD-10-CM | POA: Diagnosis not present

## 2021-12-30 DIAGNOSIS — F321 Major depressive disorder, single episode, moderate: Secondary | ICD-10-CM | POA: Diagnosis not present

## 2021-12-31 DIAGNOSIS — F321 Major depressive disorder, single episode, moderate: Secondary | ICD-10-CM | POA: Diagnosis not present

## 2022-01-03 DIAGNOSIS — F321 Major depressive disorder, single episode, moderate: Secondary | ICD-10-CM | POA: Diagnosis not present

## 2022-01-04 ENCOUNTER — Ambulatory Visit: Payer: Self-pay

## 2022-01-04 DIAGNOSIS — F321 Major depressive disorder, single episode, moderate: Secondary | ICD-10-CM | POA: Diagnosis not present

## 2022-01-04 NOTE — Telephone Encounter (Signed)
Pt stated she wanted provider to change her medication due to it not being strong enough / phenytoin (DILANTIN) 100 MG ER capsule/ please advise  ? ? ? ?Chief Complaint: Pt. States she needs Dilantin adjusted. Had a seizure 2 weeks ago and 1 today. Declines to come in today. Appointment made for tomorrow. ?Symptoms: Feels tired now. Alert, oriented. ?Frequency: Today ?Pertinent Negatives: Patient denies  ?Disposition: [] ED /[] Urgent Care (no appt availability in office) / [x] Appointment(In office/virtual)/ []  [place] [facility]/ [] Home Care/ [] Refused Recommended Disposition /[] [place] [facility]/ []  Follow-up with PCP ?Additional Notes: Instructed to call 911 if another seizure occurs. States she has a friend staying with her.  ?Reason for Disposition ? 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 ?Answer Assessment - Initial Assessment Questions ?1. ONSET: "How long did the seizure last?" (e.g., seconds, minutes)  ?    15 minutes ?2. CONTENT: "Describe what happened during the seizure. Did the body become stiff? Was there any jerking?"  ?    Shaking ?3. CIRCUMSTANCE: "What was the person doing when the seizure began?"  ?    Talking ?4. MENTAL STATUS: "Does the person know who they are, who you are, and where they are?"  ?    Alert ?5. PRIOR SEIZURES: "Has the person had a seizure (convulsion) before?" If Yes, ask: "When was the last time?" and "What happened last time?" ?    Yes ?6. EPILEPSY: "Does the person have epilepsy?" Note: Check for medical ID bracelet. ?    Yes ?7. MEDICINES: "Does the person take anticonvulsant medications?" (e.g., Yes, No; compliance, any recent changes) ?    Yes ?8. INJURY: "Did the person hurt himself during the seizure?" (e.g., head, tongue) ?    No ?9. OTHER SYMPTOMS: "Are there any other symptoms?" (e.g., fever, headache) ?    Feels drained ?10. PREGNANCY: "Is there any chance you are pregnant?" "When was your last menstrual period?" ?      No ? ?Protocols  used: Seizure-A-AH ? ?

## 2022-01-05 ENCOUNTER — Ambulatory Visit: Payer: Medicaid Other | Admitting: Internal Medicine

## 2022-01-05 DIAGNOSIS — F321 Major depressive disorder, single episode, moderate: Secondary | ICD-10-CM | POA: Diagnosis not present

## 2022-01-06 DIAGNOSIS — F321 Major depressive disorder, single episode, moderate: Secondary | ICD-10-CM | POA: Diagnosis not present

## 2022-01-07 DIAGNOSIS — F321 Major depressive disorder, single episode, moderate: Secondary | ICD-10-CM | POA: Diagnosis not present

## 2022-01-08 DIAGNOSIS — F321 Major depressive disorder, single episode, moderate: Secondary | ICD-10-CM | POA: Diagnosis not present

## 2022-01-10 DIAGNOSIS — F321 Major depressive disorder, single episode, moderate: Secondary | ICD-10-CM | POA: Diagnosis not present

## 2022-01-11 DIAGNOSIS — F321 Major depressive disorder, single episode, moderate: Secondary | ICD-10-CM | POA: Diagnosis not present

## 2022-01-12 DIAGNOSIS — F321 Major depressive disorder, single episode, moderate: Secondary | ICD-10-CM | POA: Diagnosis not present

## 2022-01-13 DIAGNOSIS — F321 Major depressive disorder, single episode, moderate: Secondary | ICD-10-CM | POA: Diagnosis not present

## 2022-01-14 DIAGNOSIS — F321 Major depressive disorder, single episode, moderate: Secondary | ICD-10-CM | POA: Diagnosis not present

## 2022-01-15 ENCOUNTER — Other Ambulatory Visit: Payer: Self-pay | Admitting: Internal Medicine

## 2022-01-17 DIAGNOSIS — F321 Major depressive disorder, single episode, moderate: Secondary | ICD-10-CM | POA: Diagnosis not present

## 2022-01-17 NOTE — Telephone Encounter (Signed)
LVM asking patient to call back to schedule an appt 

## 2022-01-17 NOTE — Telephone Encounter (Signed)
Requested medication (s) are due for refill today: No  Requested medication (s) are on the active medication list: No  Last refill:    Future visit scheduled: No  Notes to clinic:  Medication has been d/c.    Requested Prescriptions  Pending Prescriptions Disp Refills   pregabalin (LYRICA) 75 MG capsule [Pharmacy Med Name: PREGABALIN 75MG  CAPSULES] 30 capsule     Sig: TAKE 1 CAPSULE(75 MG) BY MOUTH TWICE DAILY     Not Delegated - Neurology:  Anticonvulsants - Controlled - pregabalin Failed - 01/15/2022 11:38 AM      Failed - This refill cannot be delegated      Passed - Cr in normal range and within 360 days    Creatinine, Ser  Date Value Ref Range Status  10/25/2021 0.83 0.44 - 1.00 mg/dL Final         Passed - Completed PHQ-2 or PHQ-9 in the last 360 days      Passed - Valid encounter within last 12 months    Recent Outpatient Visits           2 months ago Chronic pain syndrome   [facility] Vigg, Avanti, MD   3 months ago Chronic low back pain without sciatica, unspecified back pain laterality   [facility] Vigg, Avanti, MD   4 months ago Hemorrhoids, unspecified hemorrhoid type   [place], Megan P, DO   5 months ago [place], Lauren A, NP   6 months ago [place] Mc[place]wee, Scheryl Darter, NP

## 2022-01-18 DIAGNOSIS — F321 Major depressive disorder, single episode, moderate: Secondary | ICD-10-CM | POA: Diagnosis not present

## 2022-01-19 DIAGNOSIS — F321 Major depressive disorder, single episode, moderate: Secondary | ICD-10-CM | POA: Diagnosis not present

## 2022-01-20 DIAGNOSIS — F321 Major depressive disorder, single episode, moderate: Secondary | ICD-10-CM | POA: Diagnosis not present

## 2022-01-21 DIAGNOSIS — F321 Major depressive disorder, single episode, moderate: Secondary | ICD-10-CM | POA: Diagnosis not present

## 2022-01-22 DIAGNOSIS — F321 Major depressive disorder, single episode, moderate: Secondary | ICD-10-CM | POA: Diagnosis not present

## 2022-01-24 DIAGNOSIS — F321 Major depressive disorder, single episode, moderate: Secondary | ICD-10-CM | POA: Diagnosis not present

## 2022-01-25 DIAGNOSIS — F321 Major depressive disorder, single episode, moderate: Secondary | ICD-10-CM | POA: Diagnosis not present

## 2022-01-26 DIAGNOSIS — F321 Major depressive disorder, single episode, moderate: Secondary | ICD-10-CM | POA: Diagnosis not present

## 2022-01-27 DIAGNOSIS — F321 Major depressive disorder, single episode, moderate: Secondary | ICD-10-CM | POA: Diagnosis not present

## 2022-01-28 DIAGNOSIS — F321 Major depressive disorder, single episode, moderate: Secondary | ICD-10-CM | POA: Diagnosis not present

## 2022-01-29 DIAGNOSIS — F321 Major depressive disorder, single episode, moderate: Secondary | ICD-10-CM | POA: Diagnosis not present

## 2022-01-31 DIAGNOSIS — F321 Major depressive disorder, single episode, moderate: Secondary | ICD-10-CM | POA: Diagnosis not present

## 2022-02-01 DIAGNOSIS — F321 Major depressive disorder, single episode, moderate: Secondary | ICD-10-CM | POA: Diagnosis not present

## 2022-02-02 DIAGNOSIS — F321 Major depressive disorder, single episode, moderate: Secondary | ICD-10-CM | POA: Diagnosis not present

## 2022-02-03 DIAGNOSIS — F321 Major depressive disorder, single episode, moderate: Secondary | ICD-10-CM | POA: Diagnosis not present

## 2022-02-04 DIAGNOSIS — F321 Major depressive disorder, single episode, moderate: Secondary | ICD-10-CM | POA: Diagnosis not present

## 2022-02-05 DIAGNOSIS — F321 Major depressive disorder, single episode, moderate: Secondary | ICD-10-CM | POA: Diagnosis not present

## 2022-02-07 DIAGNOSIS — F321 Major depressive disorder, single episode, moderate: Secondary | ICD-10-CM | POA: Diagnosis not present

## 2022-02-08 DIAGNOSIS — F321 Major depressive disorder, single episode, moderate: Secondary | ICD-10-CM | POA: Diagnosis not present

## 2022-02-09 DIAGNOSIS — F321 Major depressive disorder, single episode, moderate: Secondary | ICD-10-CM | POA: Diagnosis not present

## 2022-02-10 DIAGNOSIS — F321 Major depressive disorder, single episode, moderate: Secondary | ICD-10-CM | POA: Diagnosis not present

## 2022-02-11 DIAGNOSIS — F321 Major depressive disorder, single episode, moderate: Secondary | ICD-10-CM | POA: Diagnosis not present

## 2022-02-12 DIAGNOSIS — F321 Major depressive disorder, single episode, moderate: Secondary | ICD-10-CM | POA: Diagnosis not present

## 2022-02-14 DIAGNOSIS — F321 Major depressive disorder, single episode, moderate: Secondary | ICD-10-CM | POA: Diagnosis not present

## 2022-02-15 DIAGNOSIS — F321 Major depressive disorder, single episode, moderate: Secondary | ICD-10-CM | POA: Diagnosis not present

## 2022-02-16 ENCOUNTER — Ambulatory Visit: Payer: Self-pay | Admitting: *Deleted

## 2022-02-16 DIAGNOSIS — F321 Major depressive disorder, single episode, moderate: Secondary | ICD-10-CM | POA: Diagnosis not present

## 2022-02-16 NOTE — Telephone Encounter (Signed)
  Chief Complaint: Flank Pain Symptoms: 11/10 left flank area pain, radiates down hip. Constant x 1 week, worsening today. Pt sounds distressed Frequency: 1 week Pertinent Negatives: Patient denies dysuria, blood in urine, nausea Disposition: [x] ED /[] Urgent Care (no appt availability in office) / [] Appointment(In office/virtual)/ []  [place] [facility]/ [] Home Care/ [] Refused Recommended Disposition /[] [place] [facility]/ []  Follow-up with PCP Additional Notes: Pt sounds distressed. Advised ED, offered EMS, states she will have co -worker take her. Care advise given, verbalizes understanding. Reason for Disposition  [1] SEVERE pain (e.g., excruciating, scale 8-10) AND [2] present > 1 hour  Answer Assessment - Initial Assessment Questions 1. LOCATION: "Where does it hurt?" (e.g., left, right)     Left side 2. ONSET: "When did the pain start?"     Last week, worse today 3. SEVERITY: "How bad is the pain?" (e.g., Scale 1-10; mild, moderate, or severe)   - MILD (1-3): doesn't interfere with normal activities    - MODERATE (4-7): interferes with normal activities or awakens from sleep    - SEVERE (8-10): excruciating pain and patient unable to do normal activities (stays in bed)       11/10 4. PATTERN: "Does the pain come and go, or is it constant?"      Constant 5. CAUSE: "What do you think is causing the pain?"     Unsure 6. OTHER SYMPTOMS:  "Do you have any other symptoms?" (e.g., fever, abdominal pain, vomiting, leg weakness, burning with urination, blood in urine)     Left hip tender to touch, side 11/10 pain  Protocols used: Flank Pain-A-AH

## 2022-02-16 NOTE — Telephone Encounter (Signed)
FYI to provider. Patient directed to go to the ER by nurse triage.

## 2022-02-17 DIAGNOSIS — F321 Major depressive disorder, single episode, moderate: Secondary | ICD-10-CM | POA: Diagnosis not present

## 2022-02-18 DIAGNOSIS — F321 Major depressive disorder, single episode, moderate: Secondary | ICD-10-CM | POA: Diagnosis not present

## 2022-02-19 DIAGNOSIS — F321 Major depressive disorder, single episode, moderate: Secondary | ICD-10-CM | POA: Diagnosis not present

## 2022-02-21 DIAGNOSIS — F321 Major depressive disorder, single episode, moderate: Secondary | ICD-10-CM | POA: Diagnosis not present

## 2022-02-22 DIAGNOSIS — F321 Major depressive disorder, single episode, moderate: Secondary | ICD-10-CM | POA: Diagnosis not present

## 2022-02-23 DIAGNOSIS — F321 Major depressive disorder, single episode, moderate: Secondary | ICD-10-CM | POA: Diagnosis not present

## 2022-02-24 DIAGNOSIS — F321 Major depressive disorder, single episode, moderate: Secondary | ICD-10-CM | POA: Diagnosis not present

## 2022-02-25 DIAGNOSIS — F321 Major depressive disorder, single episode, moderate: Secondary | ICD-10-CM | POA: Diagnosis not present

## 2022-02-26 DIAGNOSIS — F321 Major depressive disorder, single episode, moderate: Secondary | ICD-10-CM | POA: Diagnosis not present

## 2022-02-28 ENCOUNTER — Other Ambulatory Visit: Payer: Self-pay | Admitting: Nurse Practitioner

## 2022-02-28 DIAGNOSIS — F321 Major depressive disorder, single episode, moderate: Secondary | ICD-10-CM | POA: Diagnosis not present

## 2022-03-01 DIAGNOSIS — F321 Major depressive disorder, single episode, moderate: Secondary | ICD-10-CM | POA: Diagnosis not present

## 2022-03-02 DIAGNOSIS — F321 Major depressive disorder, single episode, moderate: Secondary | ICD-10-CM | POA: Diagnosis not present

## 2022-03-02 NOTE — Telephone Encounter (Signed)
Patient is due for a follow up next week. Please call to schedule and then route to Dr. Laural Anderson if patient does not have enough medication to get to appointment.

## 2022-03-02 NOTE — Telephone Encounter (Signed)
Requested medication (s) are due for refill today: yes  Requested medication (s) are on the active medication list: yes  Last refill:  04/26/21  Future visit scheduled: yes  Notes to clinic:  Unable to refill per protocol, cannot delegate.      Requested Prescriptions  Pending Prescriptions Disp Refills   phenytoin (DILANTIN) 100 MG ER capsule [Pharmacy Med Name: PHENYTOIN SODIUM 100MG  EXT CAPSULES] 270 capsule     Sig: TAKE 1 CAPSULE(100 MG) BY MOUTH THREE TIMES DAILY     Not Delegated - Neurology:  Anticonvulsants - phenytoin Failed - 02/28/2022  3:13 PM      Failed - This refill cannot be delegated      Failed - ALT in normal range and within 360 days    ALT  Date Value Ref Range Status  12/09/2020 15 0 - 44 U/L Final         Failed - AST in normal range and within 360 days    AST  Date Value Ref Range Status  12/09/2020 18 15 - 41 U/L Final         Failed - HGB in normal range and within 360 days    Hemoglobin  Date Value Ref Range Status  10/25/2021 11.8 (L) 12.0 - 15.0 g/dL Final  10/27/2021 32/67/1245 11.1 - 15.9 g/dL Final         Failed - Phenytoin (serum) in normal range and within 360 days    Phenytoin, Total  Date Value Ref Range Status  03/12/2016 5.3 (L) 10.0 - 20.0 ug/mL Final    Comment:    (NOTE)                                Detection Limit =  0.8                          <0.8 Indicates None Detected    Phenytoin (Dilantin), Serum  Date Value Ref Range Status  12/31/2020 9.0 (L) 10.0 - 20.0 ug/mL Final    Comment:                                    Detection Limit =  0.8                           <0.8 Indicates None Detected    Phenytoin, Free  Date Value Ref Range Status  03/12/2016 0.6 (L) 1.0 - 2.0 ug/mL Final    Comment:    (NOTE)                                Detection Limit = 0.5 Performed At: [facility] [address] [place], [place] [id number] MD Mila Homer          Passed - HCT in normal range  and within 360 days    HCT  Date Value Ref Range Status  10/25/2021 37.2 36.0 - 46.0 % Final   Hematocrit  Date Value Ref Range Status  12/01/2020 36.1 34.0 - 46.6 % Final         Passed - PLT in normal range and within 360 days    Platelets  Date Value Ref Range Status  10/25/2021 326 150 - 400 K/uL Final  12/01/2020 319 150 - 450 x10E3/uL Final         Passed - WBC in normal range and within 360 days    WBC  Date Value Ref Range Status  10/25/2021 4.8 4.0 - 10.5 K/uL Final         Passed - Completed PHQ-2 or PHQ-9 in the last 360 days      Passed - Patient is not pregnant      Passed - Valid encounter within last 12 months    Recent Outpatient Visits           3 months ago Chronic pain syndrome   [facility] Vigg, Avanti, MD   5 months ago Chronic low back pain without sciatica, unspecified back pain laterality   [facility] Vigg, Avanti, MD   6 months ago Hemorrhoids, unspecified hemorrhoid type   [facility] Cheshire, Megan P, DO   6 months ago Folliculitis   [facility] McElwee, Lauren A, NP   7 months ago Folliculitis   [facility] McElwee, Jake Church, NP

## 2022-03-03 DIAGNOSIS — F321 Major depressive disorder, single episode, moderate: Secondary | ICD-10-CM | POA: Diagnosis not present

## 2022-03-03 NOTE — Telephone Encounter (Signed)
This was last written for 30 days with 2 refills 11 months ago. Please confirm patient has been taking this medicine and dose she has been taking

## 2022-03-04 DIAGNOSIS — F321 Major depressive disorder, single episode, moderate: Secondary | ICD-10-CM | POA: Diagnosis not present

## 2022-03-05 DIAGNOSIS — F321 Major depressive disorder, single episode, moderate: Secondary | ICD-10-CM | POA: Diagnosis not present

## 2022-03-07 DIAGNOSIS — F321 Major depressive disorder, single episode, moderate: Secondary | ICD-10-CM | POA: Diagnosis not present

## 2022-03-08 DIAGNOSIS — F321 Major depressive disorder, single episode, moderate: Secondary | ICD-10-CM | POA: Diagnosis not present

## 2022-03-08 NOTE — Telephone Encounter (Signed)
Attempted to contact patient, NA unable to Lvm. Patient has appointment tomorrow 03/09/22 in office.

## 2022-03-09 ENCOUNTER — Encounter: Payer: Self-pay | Admitting: Unknown Physician Specialty

## 2022-03-09 ENCOUNTER — Ambulatory Visit (INDEPENDENT_AMBULATORY_CARE_PROVIDER_SITE_OTHER): Payer: Medicaid Other | Admitting: Unknown Physician Specialty

## 2022-03-09 ENCOUNTER — Ambulatory Visit: Payer: Medicaid Other | Admitting: Unknown Physician Specialty

## 2022-03-09 VITALS — BP 126/89 | HR 106 | Temp 98.6°F | Ht 65.0 in | Wt 165.6 lb

## 2022-03-09 DIAGNOSIS — M25562 Pain in left knee: Secondary | ICD-10-CM | POA: Diagnosis not present

## 2022-03-09 DIAGNOSIS — R590 Localized enlarged lymph nodes: Secondary | ICD-10-CM | POA: Diagnosis not present

## 2022-03-09 DIAGNOSIS — F321 Major depressive disorder, single episode, moderate: Secondary | ICD-10-CM | POA: Diagnosis not present

## 2022-03-09 DIAGNOSIS — Z7251 High risk heterosexual behavior: Secondary | ICD-10-CM

## 2022-03-09 NOTE — Progress Notes (Signed)
BP 126/89   Pulse (!) 106   Temp 98.6 F (37 C) (Oral)   Ht 5\' 5"  (1.651 m)   Wt 165 lb 9.6 oz (75.1 kg)   SpO2 98%   BMI 27.56 kg/m    Subjective:    Patient ID: , female    DOB: [birth year], 51 y.o.   MRN: [id number]  HPI: Karina Anderson is a 51 y.o. female  Chief Complaint  Patient presents with   Knee Pain    Left knee pain started hurting last week.    HIV testing    Patient wants HIV testing   Swollen glands    Patient believes the glands in her neck are swollen   Left knee pain - Pt states she had several injections of right knee done here by Dr. 44.  That has helped in the past.  Now left knee is bothering, almost like "cutting."  She has received Tramadol and Tylenol not working.  Last knee x-ray was 2021 of the left knee without any abnormalities.  No locking or popping.  States the only thing that helps is Tylenol PM as it helps her rest.  Feels like her right knee.    Recenlty found partner was unfaithful.  No specific swelling noted just a generalized feeling.  Only interested in HIV but refusing GC and Chlamydia.     Relevant past medical, surgical, family and social history reviewed and updated as indicated. Interim medical history since our last visit reviewed. Allergies and medications reviewed and updated.  Review of Systems  Per HPI unless specifically indicated above     Objective:    BP 126/89   Pulse (!) 106   Temp 98.6 F (37 C) (Oral)   Ht 5\' 5"  (1.651 m)   Wt 165 lb 9.6 oz (75.1 kg)   SpO2 98%   BMI 27.56 kg/m   Wt Readings from Last 3 Encounters:  03/09/22 165 lb 9.6 oz (75.1 kg)  11/08/21 169 lb (76.7 kg)  10/25/21 175 lb (79.4 kg)    Physical Exam Constitutional:      General: She is not in acute distress.    Appearance: Normal appearance. She is well-developed.  HENT:     Head: Normocephalic and atraumatic.  Eyes:     General: Lids are normal. No scleral icterus.       Right eye: No discharge.        Left eye:  No discharge.     Conjunctiva/sclera: Conjunctivae normal.  Neck:     Vascular: No carotid bruit or JVD.  Cardiovascular:     Rate and Rhythm: Normal rate and regular rhythm.     Heart sounds: Normal heart sounds.  Pulmonary:     Effort: Pulmonary effort is normal. No respiratory distress.     Breath sounds: Normal breath sounds.  Abdominal:     Palpations: There is no hepatomegaly or splenomegaly.  Musculoskeletal:        General: Normal range of motion.     Cervical back: Normal range of motion and neck supple. No edema.  Lymphadenopathy:     Cervical: No cervical adenopathy.  Skin:    General: Skin is warm and dry.     Coloration: Skin is not pale.     Findings: No rash.  Neurological:     Mental Status: She is alert and oriented to person, place, and time.  Psychiatric:        Behavior: Behavior normal.  Thought Content: Thought content normal.        Judgment: Judgment normal.     Results for orders placed or performed during the hospital encounter of 10/25/21  Urinalysis, Routine w reflex microscopic Urine, Clean Catch  Result Value Ref Range   Color, Urine YELLOW (A) YELLOW   APPearance HAZY (A) CLEAR   Specific Gravity, Urine 1.027 1.005 - 1.030   pH 6.0 5.0 - 8.0   Glucose, UA NEGATIVE NEGATIVE mg/dL   Hgb urine dipstick NEGATIVE NEGATIVE   Bilirubin Urine NEGATIVE NEGATIVE   Ketones, ur NEGATIVE NEGATIVE mg/dL   Protein, ur NEGATIVE NEGATIVE mg/dL   Nitrite NEGATIVE NEGATIVE   Leukocytes,Ua TRACE (A) NEGATIVE   RBC / HPF 0-5 0 - 5 RBC/hpf   WBC, UA 0-5 0 - 5 WBC/hpf   Bacteria, UA NONE SEEN NONE SEEN   Squamous Epithelial / LPF 6-10 0 - 5   Mucus PRESENT   Lipase, blood  Result Value Ref Range   Lipase 22 11 - 51 U/L  CBC with Differential  Result Value Ref Range   WBC 4.8 4.0 - 10.5 K/uL   RBC 4.38 3.87 - 5.11 MIL/uL   Hemoglobin 11.8 (L) 12.0 - 15.0 g/dL   HCT 93.2 67.1 - 24.5 %   MCV 84.9 80.0 - 100.0 fL   MCH 26.9 26.0 - 34.0 pg   MCHC  31.7 30.0 - 36.0 g/dL   RDW 80.9 98.3 - 38.2 %   Platelets 326 150 - 400 K/uL   nRBC 0.0 0.0 - 0.2 %   Neutrophils Relative % 54 %   Neutro Abs 2.6 1.7 - 7.7 K/uL   Lymphocytes Relative 34 %   Lymphs Abs 1.6 0.7 - 4.0 K/uL   Monocytes Relative 8 %   Monocytes Absolute 0.4 0.1 - 1.0 K/uL   Eosinophils Relative 3 %   Eosinophils Absolute 0.1 0.0 - 0.5 K/uL   Basophils Relative 1 %   Basophils Absolute 0.0 0.0 - 0.1 K/uL   Immature Granulocytes 0 %   Abs Immature Granulocytes 0.02 0.00 - 0.07 K/uL  Basic metabolic panel  Result Value Ref Range   Sodium 136 135 - 145 mmol/L   Potassium 3.4 (L) 3.5 - 5.1 mmol/L   Chloride 102 98 - 111 mmol/L   CO2 28 22 - 32 mmol/L   Glucose, Bld 87 70 - 99 mg/dL   BUN 17 6 - 20 mg/dL   Creatinine, Ser 5.05 0.44 - 1.00 mg/dL   Calcium 9.2 8.9 - 39.7 mg/dL   GFR, Estimated >67 >34 mL/min   Anion gap 6 5 - 15  POC urine preg, ED  Result Value Ref Range   Preg Test, Ur NEGATIVE NEGATIVE      Assessment & Plan:   Problem List Items Addressed This Visit   None Visit Diagnoses     Acute pain of left knee    -  Primary   Pt with severe pain of knee without -ray abnormalities 4 years ago.  Get another x-ray, and refer to orthopedics.  No controlled subst.     Relevant Orders   DG Knee Complete 4 Views Left   Ambulatory referral to Orthopedic Surgery   High risk heterosexual behavior       Recently found partner unfaithful.  Will get an HIV Refused GC and Chlamydia.     Relevant Orders   HIV Antibody (routine testing w rflx)   Cervical lymphadenopathy  Not appreciated by me.  Pt unable to identify any specific nodes, just a generalized feeling        Follow up plan: Return if symptoms worsen or fail to improve.

## 2022-03-10 DIAGNOSIS — F321 Major depressive disorder, single episode, moderate: Secondary | ICD-10-CM | POA: Diagnosis not present

## 2022-03-10 LAB — HIV ANTIBODY (ROUTINE TESTING W REFLEX): HIV Screen 4th Generation wRfx: NONREACTIVE

## 2022-03-10 NOTE — Progress Notes (Signed)
Good morning crew, please let [name] know her HIV testing returned negative.

## 2022-03-11 DIAGNOSIS — F321 Major depressive disorder, single episode, moderate: Secondary | ICD-10-CM | POA: Diagnosis not present

## 2022-03-12 DIAGNOSIS — F321 Major depressive disorder, single episode, moderate: Secondary | ICD-10-CM | POA: Diagnosis not present

## 2022-03-14 DIAGNOSIS — F321 Major depressive disorder, single episode, moderate: Secondary | ICD-10-CM | POA: Diagnosis not present

## 2022-03-15 DIAGNOSIS — F321 Major depressive disorder, single episode, moderate: Secondary | ICD-10-CM | POA: Diagnosis not present

## 2022-03-16 ENCOUNTER — Encounter: Payer: Self-pay | Admitting: Unknown Physician Specialty

## 2022-03-16 ENCOUNTER — Ambulatory Visit (INDEPENDENT_AMBULATORY_CARE_PROVIDER_SITE_OTHER): Payer: Medicaid Other | Admitting: Unknown Physician Specialty

## 2022-03-16 VITALS — BP 138/95 | HR 106 | Temp 98.4°F | Wt 167.8 lb

## 2022-03-16 DIAGNOSIS — G894 Chronic pain syndrome: Secondary | ICD-10-CM | POA: Diagnosis not present

## 2022-03-16 DIAGNOSIS — N83201 Unspecified ovarian cyst, right side: Secondary | ICD-10-CM | POA: Diagnosis not present

## 2022-03-16 DIAGNOSIS — F321 Major depressive disorder, single episode, moderate: Secondary | ICD-10-CM | POA: Diagnosis not present

## 2022-03-16 MED ORDER — LIDOCAINE 5 % EX PTCH: 1.0000 | MEDICATED_PATCH | CUTANEOUS | 0 refills | Status: DC

## 2022-03-16 NOTE — Progress Notes (Signed)
BP (!) 138/95 (BP Location: Right Arm, Cuff Size: Normal)   Pulse (!) 106   Temp 98.4 F (36.9 C) (Oral)   Wt 167 lb 12.8 oz (76.1 kg)   SpO2 94%   BMI 27.92 kg/m    Subjective:    Patient ID: [employer], female    DOB: 11-10-1970, 51 y.o.   MRN: [id number]  HPI: Karina Anderson is a 51 y.o. female  Chief Complaint  Patient presents with   Follow-up    1 week f/up- pt states her knee pain is not any better. States she is supposed to see ortho tomorrow.    Cyst    Pt states she has a cyst on her right side that she would like looked at.    Still having knee pain.  Hasn't gotten the x-ray due to getting over there and a co-pay and will need to cancel appt but will reschedule.  Frustrated as it is painful.  Heating blanket isn't working anymore.  States she has taken a lot of Tylenol.  Frustrated at this point because unable to take anything stronger.  Refusing Gabapentin as she states it just causes her to sleep.  States Tramadol also just makes her sleep.    Today has a cyst on her ovary.  States she is having nausea and sharp pain on the right.  LMS 3/1/2 weeks ago.    Past Surgical History:  Procedure Laterality Date   DILATION AND CURETTAGE OF UTERUS     3 miscarriages   HYDRADENITIS EXCISION     R underarm   TUBAL LIGATION     TUMOR REMOVAL  07/2020   index finger on L hand    States she has a cyst she would like me to look at.  This is not atypical for her.  States it doesn't typically drain until she gets her cycle.    Relevant past medical, surgical, family and social history reviewed and updated as indicated. Interim medical history since our last visit reviewed. Allergies and medications reviewed and updated.  Review of Systems  Per HPI unless specifically indicated above     Objective:    BP (!) 138/95 (BP Location: Right Arm, Cuff Size: Normal)   Pulse (!) 106   Temp 98.4 F (36.9 C) (Oral)   Wt 167 lb 12.8 oz (76.1 kg)   SpO2 94%   BMI 27.92 kg/m    Wt Readings from Last 3 Encounters:  03/16/22 167 lb 12.8 oz (76.1 kg)  03/09/22 165 lb 9.6 oz (75.1 kg)  11/08/21 169 lb (76.7 kg)    Physical Exam Constitutional:      General: She is not in acute distress.    Appearance: Normal appearance. She is well-developed.  HENT:     Head: Normocephalic and atraumatic.  Eyes:     General: Lids are normal. No scleral icterus.       Right eye: No discharge.        Left eye: No discharge.     Conjunctiva/sclera: Conjunctivae normal.  Cardiovascular:     Rate and Rhythm: Normal rate.  Pulmonary:     Effort: Pulmonary effort is normal.  Abdominal:     Palpations: There is no hepatomegaly or splenomegaly.  Musculoskeletal:        General: Normal range of motion.  Skin:    Coloration: Skin is not pale.     Findings: No rash.  Neurological:     Mental Status: She is alert and  oriented to person, place, and time.  Psychiatric:        Behavior: Behavior normal.        Thought Content: Thought content normal.        Judgment: Judgment normal.     Results for orders placed or performed in visit on 03/09/22  HIV Antibody (routine testing w rflx)  Result Value Ref Range   HIV Screen 4th Generation wRfx Non Reactive Non Reactive      Assessment & Plan:   Problem List Items Addressed This Visit       Unprioritized   Chronic pain syndrome - Primary (Chronic)    No controlled substances from this office according to past note.  Pt uspet and threatens an attorney.  Struggling with right knee pain and right ovarian cyst today but also has chronic pain.  Primary previous pt states managed her on low dosages of medications. Refusing Gabapentin or short rx of Tramadol as they just put her to sleep.    Referred to pain clinic.  States she has been before and got "the run around" and sent back here.   She is willing to go again.  Review of chart states [facility] was prescribing pain meds but stopped with allegedly "selling" meds though no  confirmatory testing done of this.  We have not been prescribing pain meds other than Tramadol at this office       Relevant Orders   Ambulatory referral to Pain Clinic   Other Visit Diagnoses     Cyst of right ovary       LMP 3/1/2 weeks ago.  Pt states it does resolve with menstural period. She worries about pain and discussed Gabapentin.  She is refusing        Follow up plan: Return if symptoms worsen or fail to improve.

## 2022-03-16 NOTE — Assessment & Plan Note (Addendum)
No controlled substances from this office according to past note.  Pt uspet and threatens an attorney.  Struggling with right knee pain and right ovarian cyst today but also has chronic pain.  Primary previous pt states managed her on low dosages of medications. Refusing Gabapentin or short rx of Tramadol as they just put her to sleep.    Referred to pain clinic.  States she has been before and got "the run around" and sent back here.   She is willing to go again.  Review of chart states [facility] was prescribing pain meds but stopped with allegedly "selling" meds though no confirmatory testing done of this.  We have not been prescribing pain meds other than Tramadol at this office

## 2022-03-17 DIAGNOSIS — F321 Major depressive disorder, single episode, moderate: Secondary | ICD-10-CM | POA: Diagnosis not present

## 2022-03-18 DIAGNOSIS — F321 Major depressive disorder, single episode, moderate: Secondary | ICD-10-CM | POA: Diagnosis not present

## 2022-03-19 DIAGNOSIS — F321 Major depressive disorder, single episode, moderate: Secondary | ICD-10-CM | POA: Diagnosis not present

## 2022-03-21 DIAGNOSIS — F321 Major depressive disorder, single episode, moderate: Secondary | ICD-10-CM | POA: Diagnosis not present

## 2022-03-22 DIAGNOSIS — F321 Major depressive disorder, single episode, moderate: Secondary | ICD-10-CM | POA: Diagnosis not present

## 2022-03-23 DIAGNOSIS — F321 Major depressive disorder, single episode, moderate: Secondary | ICD-10-CM | POA: Diagnosis not present

## 2022-03-24 DIAGNOSIS — F321 Major depressive disorder, single episode, moderate: Secondary | ICD-10-CM | POA: Diagnosis not present

## 2022-03-25 DIAGNOSIS — F321 Major depressive disorder, single episode, moderate: Secondary | ICD-10-CM | POA: Diagnosis not present

## 2022-03-26 DIAGNOSIS — F321 Major depressive disorder, single episode, moderate: Secondary | ICD-10-CM | POA: Diagnosis not present

## 2022-03-28 ENCOUNTER — Encounter: Payer: Self-pay | Admitting: Unknown Physician Specialty

## 2022-03-28 DIAGNOSIS — F321 Major depressive disorder, single episode, moderate: Secondary | ICD-10-CM | POA: Diagnosis not present

## 2022-03-29 DIAGNOSIS — F321 Major depressive disorder, single episode, moderate: Secondary | ICD-10-CM | POA: Diagnosis not present

## 2022-03-30 DIAGNOSIS — F321 Major depressive disorder, single episode, moderate: Secondary | ICD-10-CM | POA: Diagnosis not present

## 2022-03-31 ENCOUNTER — Ambulatory Visit: Payer: Self-pay | Admitting: *Deleted

## 2022-03-31 DIAGNOSIS — F321 Major depressive disorder, single episode, moderate: Secondary | ICD-10-CM | POA: Diagnosis not present

## 2022-03-31 NOTE — Telephone Encounter (Signed)
Error

## 2022-03-31 NOTE — Telephone Encounter (Signed)
Summary: possible medications for back and knee pain   Pt stated her back and knees are hurting to the point that she is unable to get out of the bed to go to the bathroom. Pt requests that a nurse return call to discuss possible medications for the pain. Cb# [phone]      Chief Complaint: chronic pain Symptoms: worse, can not hardly get out of bed Frequency: constant Pertinent Negatives: Patient denies na Disposition: [] ED /[] Urgent Care (no appt availability in office) / [x] Appointment(In office/virtual)/ []  [facility]/ [] Home Care/ [] Refused Recommended Disposition /[] [facility]/ []  Follow-up with PCP Additional Notes: Pt refused appt. She has been seen twice recently in office for same. She states she was told to go to orthopedic to find the cause but she did not have the co-pay and they would not see her. She is asking for some xrays to see what is wrong with her body. I noted on a visit note something about "selling" meds, not confirmed. Pt was belligerent and accused office of racism. Refused appt, wants radiology orders and pain med.   Reason for Disposition  [1] MODERATE pain (e.g., interferes with normal activities) AND [2] present > 3 days  Answer Assessment - Initial Assessment Questions 1. ONSET: "When did the muscle aches or body pains start?"      Pt is chronic pain pt 2. LOCATION: "What part of your body is hurting?" (e.g., entire body, arms, legs)      All over 3. SEVERITY: "How bad is the pain?" (Scale 1-10; or mild, moderate, severe)   - MILD (1-3): doesn't interfere with normal activities    - MODERATE (4-7): interferes with normal activities or awakens from sleep    - SEVERE (8-10):  excruciating pain, unable to do any normal activities      Can not get out of bed 4. CAUSE: "What do you think is causing the pains?"     unknown Pt now belligerent and accusing office of racism. Pt has been in twice recently for same and told to go to Ortho.  She did not have the co-pay so they would not see her.  Protocols used: Muscle Aches and Body Pain-A-AH

## 2022-03-31 NOTE — Telephone Encounter (Signed)
Patient will need to see Ortho for management of her pain.  We have exhausted our options at this office.

## 2022-03-31 NOTE — Telephone Encounter (Signed)
Routing to providers in office to advise. Patient seen twice for pain recently. Please review notes. Can anything be done for the patient or what can she do? Does she need another appointment?

## 2022-03-31 NOTE — Telephone Encounter (Signed)
Pt called back "To see what they are going to do for me." Pain entire body.

## 2022-04-01 ENCOUNTER — Telehealth: Payer: Self-pay

## 2022-04-01 DIAGNOSIS — F321 Major depressive disorder, single episode, moderate: Secondary | ICD-10-CM | POA: Diagnosis not present

## 2022-04-01 NOTE — Telephone Encounter (Signed)
Copied from CRM [id number]. Topic: General - Inquiry >> Apr 01, 2022  8:34 AM Tiffany B wrote: Reason for CRM: please reference 04/01/2022 NT note. Patient is upset no one called her back yesterday and would like a follow up today. Informed patient of Larae Grooms note regarding patient following up with her Ortho due to her pain. Patient states she does not have money to pay the Ortho co pay. Please advise   See other telephone encounter.

## 2022-04-01 NOTE — Telephone Encounter (Signed)
Patient notified of Karen's message.  

## 2022-04-02 DIAGNOSIS — F321 Major depressive disorder, single episode, moderate: Secondary | ICD-10-CM | POA: Diagnosis not present

## 2022-04-04 DIAGNOSIS — F321 Major depressive disorder, single episode, moderate: Secondary | ICD-10-CM | POA: Diagnosis not present

## 2022-04-05 DIAGNOSIS — F321 Major depressive disorder, single episode, moderate: Secondary | ICD-10-CM | POA: Diagnosis not present

## 2022-04-06 DIAGNOSIS — F321 Major depressive disorder, single episode, moderate: Secondary | ICD-10-CM | POA: Diagnosis not present

## 2022-04-07 DIAGNOSIS — F321 Major depressive disorder, single episode, moderate: Secondary | ICD-10-CM | POA: Diagnosis not present

## 2022-04-08 DIAGNOSIS — F321 Major depressive disorder, single episode, moderate: Secondary | ICD-10-CM | POA: Diagnosis not present

## 2022-04-09 ENCOUNTER — Other Ambulatory Visit: Payer: Self-pay

## 2022-04-09 ENCOUNTER — Emergency Department: Payer: Medicaid Other

## 2022-04-09 DIAGNOSIS — K8020 Calculus of gallbladder without cholecystitis without obstruction: Secondary | ICD-10-CM | POA: Diagnosis not present

## 2022-04-09 DIAGNOSIS — K760 Fatty (change of) liver, not elsewhere classified: Secondary | ICD-10-CM | POA: Diagnosis not present

## 2022-04-09 DIAGNOSIS — R1011 Right upper quadrant pain: Secondary | ICD-10-CM

## 2022-04-09 DIAGNOSIS — K8050 Calculus of bile duct without cholangitis or cholecystitis without obstruction: Secondary | ICD-10-CM | POA: Diagnosis not present

## 2022-04-09 DIAGNOSIS — R1010 Upper abdominal pain, unspecified: Secondary | ICD-10-CM | POA: Diagnosis present

## 2022-04-09 DIAGNOSIS — K8070 Calculus of gallbladder and bile duct without cholecystitis without obstruction: Secondary | ICD-10-CM | POA: Diagnosis not present

## 2022-04-09 DIAGNOSIS — R932 Abnormal findings on diagnostic imaging of liver and biliary tract: Secondary | ICD-10-CM | POA: Diagnosis not present

## 2022-04-09 DIAGNOSIS — I10 Essential (primary) hypertension: Secondary | ICD-10-CM | POA: Diagnosis not present

## 2022-04-09 DIAGNOSIS — F321 Major depressive disorder, single episode, moderate: Secondary | ICD-10-CM | POA: Diagnosis not present

## 2022-04-09 DIAGNOSIS — R000 Tachycardia, unspecified: Secondary | ICD-10-CM | POA: Diagnosis not present

## 2022-04-09 LAB — COMPREHENSIVE METABOLIC PANEL
ALT: 9 U/L (ref 0–44)
AST: 16 U/L (ref 15–41)
Albumin: 4.1 g/dL (ref 3.5–5.0)
Alkaline Phosphatase: 73 U/L (ref 38–126)
Anion gap: 4 — ABNORMAL LOW (ref 5–15)
BUN: 18 mg/dL (ref 6–20)
CO2: 29 mmol/L (ref 22–32)
Calcium: 9.6 mg/dL (ref 8.9–10.3)
Chloride: 106 mmol/L (ref 98–111)
Creatinine, Ser: 0.83 mg/dL (ref 0.44–1.00)
GFR, Estimated: >60 mL/min (ref >60)
Glucose, Bld: 97 mg/dL (ref 70–99)
Potassium: 3.6 mmol/L (ref 3.5–5.1)
Sodium: 139 mmol/L (ref 135–145)
Total Bilirubin: 0.7 mg/dL (ref 0.3–1.2)
Total Protein: 8.2 g/dL — ABNORMAL HIGH (ref 6.5–8.1)

## 2022-04-09 LAB — LIPASE, BLOOD: Lipase: 24 U/L (ref 11–51)

## 2022-04-09 LAB — CBC
HCT: 38.1 % (ref 36.0–46.0)
Hemoglobin: 12.2 g/dL (ref 12.0–15.0)
MCH: 27.9 pg (ref 26.0–34.0)
MCHC: 32 g/dL (ref 30.0–36.0)
MCV: 87 fL (ref 80.0–100.0)
Platelets: 315 10*3/uL (ref 150–400)
RBC: 4.38 MIL/uL (ref 3.87–5.11)
RDW: 13.6 % (ref 11.5–15.5)
WBC: 7.2 10*3/uL (ref 4.0–10.5)
nRBC: 0 % (ref 0.0–0.2)

## 2022-04-09 MED ORDER — ALUM & MAG HYDROXIDE-SIMETH 200-200-20 MG/5ML PO SUSP
30.0000 mL | Freq: Once | ORAL | Status: AC
  Administered 2022-04-10: 30 mL via ORAL
  Filled 2022-04-09: qty 30

## 2022-04-09 MED ORDER — ONDANSETRON 4 MG PO TBDP
4.0000 mg | ORAL_TABLET | Freq: Once | ORAL | Status: AC
  Administered 2022-04-10: 4 mg via ORAL
  Filled 2022-04-09: qty 1

## 2022-04-09 MED ORDER — LIDOCAINE VISCOUS HCL 2 % MT SOLN
15.0000 mL | Freq: Once | OROMUCOSAL | Status: AC
  Administered 2022-04-10: 15 mL via OROMUCOSAL
  Filled 2022-04-09: qty 15

## 2022-04-09 NOTE — ED Triage Notes (Signed)
Pt states pain in upper abd for a weeks with associated nausea, denies fever, diarrhea. Pt denies shob. Pt states pain radiates to her back.

## 2022-04-10 ENCOUNTER — Emergency Department
Admission: EM | Admit: 2022-04-10 | Discharge: 2022-04-10 | Disposition: A | Discharge status: Home / Self Care | Payer: Medicaid Other | Attending: Emergency Medicine | Admitting: Emergency Medicine

## 2022-04-10 DIAGNOSIS — K8050 Calculus of bile duct without cholangitis or cholecystitis without obstruction: Secondary | ICD-10-CM

## 2022-04-10 DIAGNOSIS — K8020 Calculus of gallbladder without cholecystitis without obstruction: Secondary | ICD-10-CM

## 2022-04-10 DIAGNOSIS — R1011 Right upper quadrant pain: Secondary | ICD-10-CM

## 2022-04-10 MED ORDER — ONDANSETRON 4 MG PO TBDP: ORAL_TABLET | ORAL | 0 refills | Status: DC

## 2022-04-10 MED ORDER — OXYCODONE-ACETAMINOPHEN 5-325 MG PO TABS: 2.0000 | ORAL_TABLET | Freq: Four times a day (QID) | ORAL | 0 refills | Status: DC | PRN

## 2022-04-10 MED ORDER — OXYCODONE-ACETAMINOPHEN 5-325 MG PO TABS
2.0000 | ORAL_TABLET | Freq: Once | ORAL | Status: AC
  Administered 2022-04-10: 2 via ORAL
  Filled 2022-04-10: qty 2

## 2022-04-10 MED ORDER — DOCUSATE SODIUM 100 MG PO CAPS: ORAL_CAPSULE | ORAL | 0 refills | Status: DC

## 2022-04-10 NOTE — Discharge Instructions (Signed)
You have been seen in the Emergency Department (ED) for abdominal pain.  Your evaluation suggests that your pain is caused by gallstones.  Fortunately you do not need immediate surgery at this time, but it is important that you follow up with a surgeon as an outpatient; typically surgical removal of the gallbladder is the only thing that will definitively fix your issue.  Read through the included information about a bland diet, and use any prescribed medications as instructed.  Avoid smoking and alcohol use. ° °Please follow up as instructed above regarding today’s emergent visit and the symptoms that are bothering you. ° °Take Percocet as prescribed. Do not drink alcohol, drive or participate in any other potentially dangerous activities while taking this medication as it may make you sleepy. Do not take this medication with any other sedating medications, either prescription or over-the-counter. If you were prescribed Percocet or Vicodin, do not take these with acetaminophen (Tylenol) as it is already contained within these medications. °  °This medication is an opiate (or narcotic) pain medication and can be habit forming.  Use it as little as possible to achieve adequate pain control.  Do not use or use it with extreme caution if you have a history of opiate abuse or dependence.  If you are on a pain contract with your primary care doctor or a pain specialist, be sure to let them know you were prescribed this medication today from the [facility].  This medication is intended for your use only - do not give any to anyone else and keep it in a secure place where nobody else, especially children, have access to it.  It will also cause or worsen constipation, so you may want to consider taking an over-the-counter stool softener while you are taking this medication. ° °Return to the ED if your abdominal pain worsens or fails to improve, you develop bloody vomiting, bloody diarrhea, you  are unable to tolerate fluids due to vomiting, fever greater than 101, or other symptoms that concern you. ° °

## 2022-04-10 NOTE — ED Provider Notes (Signed)
[facility] Provider Note    Event Date/Time   First MD Initiated Contact with Patient 04/10/22 0034     (approximate)   History   Abdominal Pain   HPI  Karina Anderson is a 51 y.o. female who presents for evaluation pain in her upper abdomen primarily after she eats.  This is associated with nausea.  She said that occasionally the pain will radiate through to her back.  She said that it last usually for a few hours after eating and then eventually goes away.  She still has some pain right now.  She has not been vomiting.  She has had no diarrhea.  No increased urinary frequency or dysuria.  She has never been told that she has gallstones.     Physical Exam   Triage Vital Signs: ED Triage Vitals  Enc Vitals Group     BP 04/09/22 2131 126/84     Pulse Rate 04/09/22 2131 (!) 102     Resp 04/09/22 2131 16     Temp 04/09/22 2131 98.4 F (36.9 C)     Temp Source 04/09/22 2131 Oral     SpO2 04/09/22 2131 95 %     Weight 04/09/22 2132 79.4 kg (175 lb)     Height 04/09/22 2132 1.651 m (5\' 5" )     Head Circumference --      Peak Flow --      Pain Score 04/09/22 2132 10     Pain Loc --      Pain Edu? --      Excl. in GC? --     Most recent vital signs: Vitals:   04/10/22 0300 04/10/22 0356  BP: 117/78 114/74  Pulse:  93  Resp:  18  Temp:  97.8 F (36.6 C)  SpO2:  98%     General: Awake, no distress.  CV:  Good peripheral perfusion.  Resp:  Normal effort.  Abd:  Mild tenderness to palpation in the epigastrium and right upper quadrant but negative Murphy sign.  No lower abdominal tenderness.  No rebound or guarding. Other:  And affect are normal and appropriate under the circumstances.   ED Results / Procedures / Treatments   Labs (all labs ordered are listed, but only abnormal results are displayed) Labs Reviewed  COMPREHENSIVE METABOLIC PANEL - Abnormal; Notable for the following components:      Result Value   Total Protein 8.2 (*)     Anion gap 4 (*)    All other components within normal limits  LIPASE, BLOOD  CBC  URINALYSIS, ROUTINE W REFLEX MICROSCOPIC  POC URINE PREG, ED     EKG  ED ECG REPORT I, 04/12/22, the attending physician, personally viewed and interpreted this ECG.  Date: 04/09/2022 EKG Time: 21: 42 Rate: 102 Rhythm: Sinus tachycardia QRS Axis: normal Intervals: normal ST/T Wave abnormalities: Non-specific ST segment / T-wave changes, but no clear evidence of acute ischemia. Narrative Interpretation: no definitive evidence of acute ischemia; does not meet STEMI criteria.    RADIOLOGY I viewed and interpreted the patient's right upper quadrant ultrasound and I see evidence of cholelithiasis but no gallbladder wall thickening or pericholecystic fluid.  The radiologist report also comments on cholelithiasis without cholecystitis.  Common bile duct is within normal limits.    PROCEDURES:  Critical Care performed: No  Procedures   MEDICATIONS ORDERED IN ED: Medications  ondansetron (ZOFRAN-ODT) disintegrating tablet 4 mg (4 mg Oral Given 04/10/22 0019)  alum &  mag hydroxide-simeth (MAALOX/MYLANTA) 200-200-20 MG/5ML suspension 30 mL (30 mLs Oral Given 04/10/22 0019)  lidocaine (XYLOCAINE) 2 % viscous mouth solution 15 mL (15 mLs Mouth/Throat Given 04/10/22 0019)  oxyCODONE-acetaminophen (PERCOCET/ROXICET) 5-325 MG per tablet 2 tablet (2 tablets Oral Given 04/10/22 0215)     IMPRESSION / MDM / ASSESSMENT AND PLAN / ED COURSE  I reviewed the triage vital signs and the nursing notes.                              Differential diagnosis includes, but is not limited to, gallbladder disease including biliary colic or cholecystitis, choledocholithiasis, pancreatitis, SBO/ileus.  Patient's presentation is most consistent with acute presentation with potential threat to life or bodily function.  Labs/studies ordered include CBC, lipase, CMP, right upper quadrant ultrasound, EKG.  Examination  and work-up are reassuring.  Vital signs are stable and within normal limits.  Patient has some tenderness to palpation of the epigastrium that is relatively mild and well controlled.  Negative Murphy sign.  Ultrasound shows cholelithiasis without cholecystitis.  Labs are within normal limits including no transaminitis, no elevated lipase, no leukocytosis.  Every reported the findings of cholelithiasis and explained about biliary colic.  I provided 2 Percocet in the emergency department and wrote prescriptions as listed below.  I strongly encouraged her to follow-up with general surgery at the next billable opportunity to follow-up as an outpatient and determine if outpatient cholecystectomy is appropriate.  She states she understands and agrees.  I gave my usual and customary biliary colic return precautions.      FINAL CLINICAL IMPRESSION(S) / ED DIAGNOSES   Final diagnoses:  Biliary colic  Gallstones     Rx / DC Orders   ED Discharge Orders          Ordered    oxyCODONE-acetaminophen (PERCOCET) 5-325 MG tablet  Every 6 hours PRN        04/10/22 0340    docusate sodium (COLACE) 100 MG capsule        04/10/22 0340    ondansetron (ZOFRAN-ODT) 4 MG disintegrating tablet        04/10/22 0340             Note:  This document was prepared using Dragon voice recognition software and may include unintentional dictation errors.   Loleta Rose, MD 04/10/22 [phone]

## 2022-04-14 ENCOUNTER — Other Ambulatory Visit: Payer: Self-pay

## 2022-04-14 DIAGNOSIS — Z79899 Other long term (current) drug therapy: Secondary | ICD-10-CM | POA: Insufficient documentation

## 2022-04-14 DIAGNOSIS — R1010 Upper abdominal pain, unspecified: Secondary | ICD-10-CM | POA: Diagnosis not present

## 2022-04-14 DIAGNOSIS — M545 Low back pain, unspecified: Secondary | ICD-10-CM | POA: Insufficient documentation

## 2022-04-14 DIAGNOSIS — R109 Unspecified abdominal pain: Secondary | ICD-10-CM | POA: Diagnosis not present

## 2022-04-14 DIAGNOSIS — G8929 Other chronic pain: Secondary | ICD-10-CM | POA: Diagnosis not present

## 2022-04-14 DIAGNOSIS — K8020 Calculus of gallbladder without cholecystitis without obstruction: Secondary | ICD-10-CM | POA: Diagnosis not present

## 2022-04-14 DIAGNOSIS — Z950 Presence of cardiac pacemaker: Secondary | ICD-10-CM | POA: Insufficient documentation

## 2022-04-14 DIAGNOSIS — K8010 Calculus of gallbladder with chronic cholecystitis without obstruction: Secondary | ICD-10-CM | POA: Insufficient documentation

## 2022-04-14 DIAGNOSIS — F17200 Nicotine dependence, unspecified, uncomplicated: Secondary | ICD-10-CM | POA: Diagnosis not present

## 2022-04-14 DIAGNOSIS — R509 Fever, unspecified: Secondary | ICD-10-CM | POA: Diagnosis not present

## 2022-04-14 DIAGNOSIS — R569 Unspecified convulsions: Secondary | ICD-10-CM | POA: Diagnosis not present

## 2022-04-14 LAB — COMPREHENSIVE METABOLIC PANEL
ALT: 8 U/L (ref 0–44)
AST: 15 U/L (ref 15–41)
Albumin: 3.9 g/dL (ref 3.5–5.0)
Alkaline Phosphatase: 69 U/L (ref 38–126)
Anion gap: 10 (ref 5–15)
BUN: 15 mg/dL (ref 6–20)
CO2: 28 mmol/L (ref 22–32)
Calcium: 9.7 mg/dL (ref 8.9–10.3)
Chloride: 101 mmol/L (ref 98–111)
Creatinine, Ser: 1 mg/dL (ref 0.44–1.00)
GFR, Estimated: >60 mL/min (ref >60)
Glucose, Bld: 108 mg/dL — ABNORMAL HIGH (ref 70–99)
Potassium: 3.2 mmol/L — ABNORMAL LOW (ref 3.5–5.1)
Sodium: 139 mmol/L (ref 135–145)
Total Bilirubin: 0.5 mg/dL (ref 0.3–1.2)
Total Protein: 7.9 g/dL (ref 6.5–8.1)

## 2022-04-14 LAB — CBC WITH DIFFERENTIAL/PLATELET
Abs Immature Granulocytes: 0.03 10*3/uL (ref 0.00–0.07)
Basophils Absolute: 0 10*3/uL (ref 0.0–0.1)
Basophils Relative: 1 %
Eosinophils Absolute: 0.2 10*3/uL (ref 0.0–0.5)
Eosinophils Relative: 3 %
HCT: 36.7 % (ref 36.0–46.0)
Hemoglobin: 11.7 g/dL — ABNORMAL LOW (ref 12.0–15.0)
Immature Granulocytes: 0 %
Lymphocytes Relative: 23 %
Lymphs Abs: 1.7 10*3/uL (ref 0.7–4.0)
MCH: 28 pg (ref 26.0–34.0)
MCHC: 31.9 g/dL (ref 30.0–36.0)
MCV: 87.8 fL (ref 80.0–100.0)
Monocytes Absolute: 0.6 10*3/uL (ref 0.1–1.0)
Monocytes Relative: 8 %
Neutro Abs: 5 10*3/uL (ref 1.7–7.7)
Neutrophils Relative %: 65 %
Platelets: 318 10*3/uL (ref 150–400)
RBC: 4.18 MIL/uL (ref 3.87–5.11)
RDW: 13.3 % (ref 11.5–15.5)
WBC: 7.6 10*3/uL (ref 4.0–10.5)
nRBC: 0 % (ref 0.0–0.2)

## 2022-04-14 LAB — LIPASE, BLOOD: Lipase: 21 U/L (ref 11–51)

## 2022-04-14 NOTE — ED Triage Notes (Signed)
EMS brings pt in from home for c/o abd pain; st hx gallstones

## 2022-04-14 NOTE — ED Triage Notes (Signed)
Pt presents to ER via ems with c/o upper abd pain that started appx 2 hours ago and has been getting worse.  Pt states she was seen here 8/12 for same and was dx with gallstones.  Pt endorses some nausea and vomiting with this pain.  Pt is otherwise A&O x4 at this time in NAD in triage.

## 2022-04-15 ENCOUNTER — Other Ambulatory Visit: Payer: Self-pay

## 2022-04-15 ENCOUNTER — Emergency Department: Payer: Medicaid Other

## 2022-04-15 ENCOUNTER — Encounter
Admission: EM | Disposition: A | Discharge status: Home / Self Care | Payer: Self-pay | Source: Home / Self Care | Attending: Emergency Medicine

## 2022-04-15 ENCOUNTER — Ambulatory Visit
Admission: EM | Admit: 2022-04-15 | Discharge: 2022-04-15 | Disposition: A | Discharge status: Home / Self Care | Payer: Medicaid Other | Attending: Emergency Medicine | Admitting: Emergency Medicine

## 2022-04-15 ENCOUNTER — Emergency Department: Payer: Medicaid Other | Admitting: General Practice

## 2022-04-15 DIAGNOSIS — K819 Cholecystitis, unspecified: Secondary | ICD-10-CM | POA: Diagnosis not present

## 2022-04-15 DIAGNOSIS — K8010 Calculus of gallbladder with chronic cholecystitis without obstruction: Secondary | ICD-10-CM | POA: Diagnosis not present

## 2022-04-15 DIAGNOSIS — K8020 Calculus of gallbladder without cholecystitis without obstruction: Secondary | ICD-10-CM | POA: Diagnosis not present

## 2022-04-15 DIAGNOSIS — R109 Unspecified abdominal pain: Secondary | ICD-10-CM | POA: Diagnosis not present

## 2022-04-15 DIAGNOSIS — R1010 Upper abdominal pain, unspecified: Secondary | ICD-10-CM | POA: Diagnosis not present

## 2022-04-15 DIAGNOSIS — R509 Fever, unspecified: Secondary | ICD-10-CM | POA: Diagnosis not present

## 2022-04-15 HISTORY — PX: CHOLECYSTECTOMY: SHX55

## 2022-04-15 LAB — URINALYSIS, ROUTINE W REFLEX MICROSCOPIC
Bilirubin Urine: NEGATIVE
Glucose, UA: NEGATIVE mg/dL
Hgb urine dipstick: NEGATIVE
Ketones, ur: NEGATIVE mg/dL
Leukocytes,Ua: NEGATIVE
Nitrite: NEGATIVE
Protein, ur: NEGATIVE mg/dL
Specific Gravity, Urine: 1.027 (ref 1.005–1.030)
pH: 5 (ref 5.0–8.0)

## 2022-04-15 LAB — URINE DRUG SCREEN, QUALITATIVE (ARMC ONLY)
Amphetamines, Ur Screen: NOT DETECTED
Barbiturates, Ur Screen: NOT DETECTED
Benzodiazepine, Ur Scrn: NOT DETECTED
Cannabinoid 50 Ng, Ur ~~LOC~~: NOT DETECTED
Cocaine Metabolite,Ur ~~LOC~~: NOT DETECTED
MDMA (Ecstasy)Ur Screen: NOT DETECTED
Methadone Scn, Ur: NOT DETECTED
Opiate, Ur Screen: NOT DETECTED
Phencyclidine (PCP) Ur S: NOT DETECTED
Tricyclic, Ur Screen: NOT DETECTED

## 2022-04-15 LAB — HCG, QUANTITATIVE, PREGNANCY: hCG, Beta Chain, Quant, S: <1 m[IU]/mL (ref <5)

## 2022-04-15 LAB — PREGNANCY, URINE: Preg Test, Ur: NEGATIVE

## 2022-04-15 SURGERY — CHOLECYSTECTOMY, ROBOT-ASSISTED, LAPAROSCOPIC
Anesthesia: General | Site: Abdomen

## 2022-04-15 MED ORDER — LIDOCAINE HCL (PF) 2 % IJ SOLN
INTRAMUSCULAR | Status: AC
  Filled 2022-04-15: qty 5

## 2022-04-15 MED ORDER — HYDROMORPHONE HCL 1 MG/ML IJ SOLN
1.0000 mg | Freq: Once | INTRAMUSCULAR | Status: AC
  Administered 2022-04-15: 1 mg via INTRAVENOUS
  Filled 2022-04-15: qty 1

## 2022-04-15 MED ORDER — ONDANSETRON HCL 4 MG/2ML IJ SOLN
4.0000 mg | Freq: Once | INTRAMUSCULAR | Status: AC
  Administered 2022-04-15: 4 mg via INTRAVENOUS
  Filled 2022-04-15: qty 2

## 2022-04-15 MED ORDER — GLYCOPYRROLATE 0.2 MG/ML IJ SOLN
INTRAMUSCULAR | Status: AC
  Filled 2022-04-15: qty 1

## 2022-04-15 MED ORDER — BUPIVACAINE-EPINEPHRINE (PF) 0.5% -1:200000 IJ SOLN
INTRAMUSCULAR | Status: AC
  Filled 2022-04-15: qty 30

## 2022-04-15 MED ORDER — ONDANSETRON HCL 4 MG/2ML IJ SOLN
INTRAMUSCULAR | Status: AC
  Filled 2022-04-15: qty 2

## 2022-04-15 MED ORDER — CHLORHEXIDINE GLUCONATE CLOTH 2 % EX PADS
6.0000 | MEDICATED_PAD | Freq: Every day | CUTANEOUS | Status: DC
  Filled 2022-04-15: qty 6

## 2022-04-15 MED ORDER — INDOCYANINE GREEN 25 MG IV SOLR
2.5000 mg | Freq: Once | INTRAVENOUS | Status: AC
  Administered 2022-04-15: 2.5 mg via INTRAVENOUS
  Filled 2022-04-15: qty 1

## 2022-04-15 MED ORDER — PROPOFOL 10 MG/ML IV BOLUS
INTRAVENOUS | Status: AC
  Filled 2022-04-15: qty 20

## 2022-04-15 MED ORDER — CEFAZOLIN SODIUM-DEXTROSE 2-4 GM/100ML-% IV SOLN
INTRAVENOUS | Status: AC
  Filled 2022-04-15: qty 100

## 2022-04-15 MED ORDER — FENTANYL CITRATE (PF) 100 MCG/2ML IJ SOLN
INTRAMUSCULAR | Status: AC
  Filled 2022-04-15: qty 2

## 2022-04-15 MED ORDER — MORPHINE SULFATE (PF) 4 MG/ML IV SOLN
4.0000 mg | Freq: Once | INTRAVENOUS | Status: AC
  Administered 2022-04-15: 4 mg via INTRAVENOUS
  Filled 2022-04-15: qty 1

## 2022-04-15 MED ORDER — IOHEXOL 300 MG/ML  SOLN
100.0000 mL | Freq: Once | INTRAMUSCULAR | Status: AC | PRN
  Administered 2022-04-15: 100 mL via INTRAVENOUS

## 2022-04-15 MED ORDER — MIDAZOLAM HCL 2 MG/2ML IJ SOLN
INTRAMUSCULAR | Status: DC | PRN
  Administered 2022-04-15: 2 mg via INTRAVENOUS

## 2022-04-15 MED ORDER — SODIUM CHLORIDE 0.9 % IV BOLUS: 1000.0000 mL | Freq: Once | INTRAVENOUS | Status: DC

## 2022-04-15 MED ORDER — DEXAMETHASONE SODIUM PHOSPHATE 10 MG/ML IJ SOLN
INTRAMUSCULAR | Status: AC
  Filled 2022-04-15: qty 1

## 2022-04-15 MED ORDER — OXYCODONE HCL 5 MG PO TABS
5.0000 mg | ORAL_TABLET | Freq: Once | ORAL | Status: AC | PRN
  Administered 2022-04-15: 5 mg via ORAL

## 2022-04-15 MED ORDER — LIDOCAINE HCL (CARDIAC) PF 100 MG/5ML IV SOSY
PREFILLED_SYRINGE | INTRAVENOUS | Status: DC | PRN
  Administered 2022-04-15: 80 mg via INTRAVENOUS

## 2022-04-15 MED ORDER — CEFAZOLIN (ANCEF) 1 G IV SOLR: 2.0000 g | INTRAVENOUS | Status: DC

## 2022-04-15 MED ORDER — ROCURONIUM BROMIDE 100 MG/10ML IV SOLN
INTRAVENOUS | Status: DC | PRN
  Administered 2022-04-15: 10 mg via INTRAVENOUS
  Administered 2022-04-15: 60 mg via INTRAVENOUS

## 2022-04-15 MED ORDER — DEXMEDETOMIDINE (PRECEDEX) IN NS 20 MCG/5ML (4 MCG/ML) IV SYRINGE
PREFILLED_SYRINGE | INTRAVENOUS | Status: DC | PRN
  Administered 2022-04-15: 8 ug via INTRAVENOUS

## 2022-04-15 MED ORDER — ACETAMINOPHEN 10 MG/ML IV SOLN
INTRAVENOUS | Status: DC | PRN
  Administered 2022-04-15: 1000 mg via INTRAVENOUS

## 2022-04-15 MED ORDER — FENTANYL CITRATE (PF) 100 MCG/2ML IJ SOLN
INTRAMUSCULAR | Status: AC
  Administered 2022-04-15: 50 ug via INTRAVENOUS
  Filled 2022-04-15: qty 2

## 2022-04-15 MED ORDER — OXYCODONE HCL 5 MG PO TABS
ORAL_TABLET | ORAL | Status: AC
  Filled 2022-04-15: qty 1

## 2022-04-15 MED ORDER — 0.9 % SODIUM CHLORIDE (POUR BTL) OPTIME
TOPICAL | Status: DC | PRN
  Administered 2022-04-15: 500 mL

## 2022-04-15 MED ORDER — CEFAZOLIN SODIUM-DEXTROSE 2-4 GM/100ML-% IV SOLN
2.0000 g | INTRAVENOUS | Status: AC
  Administered 2022-04-15: 2 g via INTRAVENOUS

## 2022-04-15 MED ORDER — OXYCODONE HCL 5 MG/5ML PO SOLN: 5.0000 mg | Freq: Once | ORAL | Status: AC | PRN

## 2022-04-15 MED ORDER — ACETAMINOPHEN 10 MG/ML IV SOLN
INTRAVENOUS | Status: AC
  Filled 2022-04-15: qty 100

## 2022-04-15 MED ORDER — FENTANYL CITRATE (PF) 100 MCG/2ML IJ SOLN
25.0000 ug | INTRAMUSCULAR | Status: DC | PRN
  Administered 2022-04-15 (×2): 25 ug via INTRAVENOUS

## 2022-04-15 MED ORDER — FENTANYL CITRATE (PF) 100 MCG/2ML IJ SOLN
INTRAMUSCULAR | Status: DC | PRN
  Administered 2022-04-15 (×2): 50 ug via INTRAVENOUS

## 2022-04-15 MED ORDER — PHENYLEPHRINE HCL (PRESSORS) 10 MG/ML IV SOLN
INTRAVENOUS | Status: DC | PRN
  Administered 2022-04-15: 80 ug via INTRAVENOUS
  Administered 2022-04-15: 40 ug via INTRAVENOUS
  Administered 2022-04-15: 80 ug via INTRAVENOUS

## 2022-04-15 MED ORDER — ONDANSETRON HCL 4 MG/2ML IJ SOLN
INTRAMUSCULAR | Status: DC | PRN
  Administered 2022-04-15: 4 mg via INTRAVENOUS

## 2022-04-15 MED ORDER — SODIUM CHLORIDE 0.9 % IV SOLN: INTRAVENOUS | Status: DC

## 2022-04-15 MED ORDER — BUPIVACAINE LIPOSOME 1.3 % IJ SUSP
INTRAMUSCULAR | Status: AC
  Filled 2022-04-15: qty 20

## 2022-04-15 MED ORDER — PROPOFOL 10 MG/ML IV BOLUS
INTRAVENOUS | Status: DC | PRN
  Administered 2022-04-15: 200 mg via INTRAVENOUS

## 2022-04-15 MED ORDER — OXYCODONE-ACETAMINOPHEN 5-325 MG PO TABS: 1.0000 | ORAL_TABLET | ORAL | 0 refills | Status: DC | PRN

## 2022-04-15 MED ORDER — MIDAZOLAM HCL 2 MG/2ML IJ SOLN
INTRAMUSCULAR | Status: AC
  Filled 2022-04-15: qty 2

## 2022-04-15 MED ORDER — BUPIVACAINE-EPINEPHRINE 0.5% -1:200000 IJ SOLN
INTRAMUSCULAR | Status: DC | PRN
  Administered 2022-04-15: 50 mL via INTRAMUSCULAR

## 2022-04-15 MED ORDER — DEXAMETHASONE SODIUM PHOSPHATE 10 MG/ML IJ SOLN
INTRAMUSCULAR | Status: DC | PRN
  Administered 2022-04-15: 10 mg via INTRAVENOUS

## 2022-04-15 MED ORDER — SUGAMMADEX SODIUM 200 MG/2ML IV SOLN
INTRAVENOUS | Status: DC | PRN
  Administered 2022-04-15: 158.8 mg via INTRAVENOUS

## 2022-04-15 MED ORDER — ROCURONIUM BROMIDE 10 MG/ML (PF) SYRINGE
PREFILLED_SYRINGE | INTRAVENOUS | Status: AC
  Filled 2022-04-15: qty 10

## 2022-04-15 SURGICAL SUPPLY — 50 items
CANNULA REDUC XI 12-8 STAPL (CANNULA) ×2
CANNULA REDUCER 12-8 DVNC XI (CANNULA) ×2 IMPLANT
CATH REDDICK CHOLANGI 4FR 50CM (CATHETERS) IMPLANT
CLIP LIGATING HEMO O LOK GREEN (MISCELLANEOUS) ×2 IMPLANT
DERMABOND ADVANCED (GAUZE/BANDAGES/DRESSINGS) ×2
DERMABOND ADVANCED .7 DNX12 (GAUZE/BANDAGES/DRESSINGS) ×2 IMPLANT
DRAPE ARM DVNC X/XI (DISPOSABLE) ×8 IMPLANT
DRAPE COLUMN DVNC XI (DISPOSABLE) ×2 IMPLANT
DRAPE DA VINCI XI ARM (DISPOSABLE) ×8
DRAPE DA VINCI XI COLUMN (DISPOSABLE) ×2
ELECT CAUTERY BLADE 6.4 (BLADE) ×2 IMPLANT
ELECT REM PT RETURN 9FT ADLT (ELECTROSURGICAL) ×2
ELECTRODE REM PT RTRN 9FT ADLT (ELECTROSURGICAL) ×2 IMPLANT
GLOVE BIO SURGEON STRL SZ7 (GLOVE) ×4 IMPLANT
GOWN STRL REUS W/ TWL LRG LVL3 (GOWN DISPOSABLE) ×8 IMPLANT
GOWN STRL REUS W/TWL LRG LVL3 (GOWN DISPOSABLE) ×8
IRRIGATION STRYKERFLOW (MISCELLANEOUS) IMPLANT
IRRIGATOR STRYKERFLOW (MISCELLANEOUS)
IV CATH ANGIO 12GX3 LT BLUE (NEEDLE) IMPLANT
KIT PINK PAD W/HEAD ARE REST (MISCELLANEOUS) ×2 IMPLANT
KIT PINK PAD W/HEAD ARM REST (MISCELLANEOUS) ×2 IMPLANT
LABEL OR SOLS (LABEL) ×2 IMPLANT
MANIFOLD NEPTUNE II (INSTRUMENTS) ×2 IMPLANT
NEEDLE HYPO 22GX1.5 SAFETY (NEEDLE) ×2 IMPLANT
NS IRRIG 500ML POUR BTL (IV SOLUTION) ×2 IMPLANT
OBTURATOR OPTICAL STANDARD 8MM (TROCAR) ×2
OBTURATOR OPTICAL STND 8 DVNC (TROCAR) ×2
OBTURATOR OPTICALSTD 8 DVNC (TROCAR) ×2 IMPLANT
PACK LAP CHOLECYSTECTOMY (MISCELLANEOUS) ×2 IMPLANT
PENCIL SMOKE EVACUATOR (MISCELLANEOUS) ×2 IMPLANT
SEAL CANN UNIV 5-8 DVNC XI (MISCELLANEOUS) ×6 IMPLANT
SEAL XI 5MM-8MM UNIVERSAL (MISCELLANEOUS) ×6
SET TUBE SMOKE EVAC HIGH FLOW (TUBING) ×2 IMPLANT
SOLUTION ELECTROLUBE (MISCELLANEOUS) ×2 IMPLANT
SPIKE FLUID TRANSFER (MISCELLANEOUS) ×2 IMPLANT
SPONGE T-LAP 18X18 ~~LOC~~+RFID (SPONGE) ×2 IMPLANT
SPONGE T-LAP 4X18 ~~LOC~~+RFID (SPONGE) IMPLANT
STAPLER CANNULA SEAL DVNC XI (STAPLE) ×2 IMPLANT
STAPLER CANNULA SEAL XI (STAPLE) ×2
STOPCOCK 3 WAY MALE LL (IV SETS)
STOPCOCK 3WAY MALE LL (IV SETS) IMPLANT
SUT MNCRL AB 4-0 PS2 18 (SUTURE) ×2 IMPLANT
SUT VICRYL 0 AB UR-6 (SUTURE) ×4 IMPLANT
SYR 20ML LL LF (SYRINGE) ×2 IMPLANT
SYR 30ML LL (SYRINGE) ×2 IMPLANT
SYS BAG RETRIEVAL 10MM (BASKET) ×2
SYSTEM BAG RETRIEVAL 10MM (BASKET) ×2 IMPLANT
TRAP FLUID SMOKE EVACUATOR (MISCELLANEOUS) ×2 IMPLANT
WATER STERILE IRR 3000ML UROMA (IV SOLUTION) IMPLANT
WATER STERILE IRR 500ML POUR (IV SOLUTION) ×2 IMPLANT

## 2022-04-15 NOTE — Transfer of Care (Signed)
Immediate Anesthesia Transfer of Care Note  Patient: Karina Anderson  Procedure(s) Performed: XI ROBOTIC ASSISTED LAPAROSCOPIC CHOLECYSTECTOMY (Abdomen) INDOCYANINE GREEN FLUORESCENCE IMAGING (ICG)  Patient Location: PACU  Anesthesia Type:General  Level of Consciousness: awake and alert   Airway & Oxygen Therapy: Patient Spontanous Breathing and Patient connected to face mask oxygen  Post-op Assessment: Report given to RN and Post -op Vital signs reviewed and stable  Post vital signs: Reviewed and stable  Last Vitals:  Vitals Value Taken Time  BP 142/88 04/15/22 1324  Temp    Pulse 92 04/15/22 1328  Resp 19 04/15/22 1328  SpO2 100 % 04/15/22 1328  Vitals shown include unvalidated device data.  Last Pain:  Vitals:   04/15/22 1118  TempSrc:   PainSc: 0-No pain         Complications: No notable events documented.

## 2022-04-15 NOTE — ED Notes (Signed)
Pt to stat desk angry that she "has been waiting four and a half hours." Pt has been waiting three and a half hours and has been informed that she is the longest wait and that we are working as hard as we can to get her back. Pt ambulatory back to where she was sitting at with her dtr.

## 2022-04-15 NOTE — Anesthesia Procedure Notes (Signed)
Procedure Name: Intubation Date/Time: 04/15/2022 12:00 PM  Performed by: Malva Cogan, CRNAPre-anesthesia Checklist: Patient identified, Patient being monitored, Timeout performed, Emergency Drugs available and Suction available Patient Re-evaluated:Patient Re-evaluated prior to induction Oxygen Delivery Method: Circle system utilized Preoxygenation: Pre-oxygenation with 100% oxygen Induction Type: IV induction Ventilation: Mask ventilation without difficulty Laryngoscope Size: 3 and McGraph Grade View: Grade II Tube type: Oral Tube size: 7.0 mm Number of attempts: 1 Airway Equipment and Method: Stylet and Video-laryngoscopy Placement Confirmation: ETT inserted through vocal cords under direct vision, positive ETCO2 and breath sounds checked- equal and bilateral Secured at: 22 cm Tube secured with: Tape Dental Injury: Teeth and Oropharynx as per pre-operative assessment

## 2022-04-15 NOTE — Op Note (Signed)
Robotic assisted laparoscopic Cholecystectomy  Pre-operative Diagnosis: cholecystitis  Post-operative Diagnosis: same  Procedure:  Robotic assisted laparoscopic Cholecystectomy  Surgeon: Sterling Big, MD FACS  Anesthesia: Gen. with endotracheal tube  Findings: Cholecystitis with distended GB, adhesions from the duodenum to the GB  Estimated Blood Loss: 5cc       Specimens: Gallbladder           Complications: none   Procedure Details  The patient was seen again in the Holding Room. The benefits, complications, treatment options, and expected outcomes were discussed with the patient. The risks of bleeding, infection, recurrence of symptoms, failure to resolve symptoms, bile duct damage, bile duct leak, retained common bile duct stone, bowel injury, any of which could require further surgery and/or ERCP, stent, or papillotomy were reviewed with the patient. The likelihood of improving the patient's symptoms with return to their baseline status is good.  The patient and/or family concurred with the proposed plan, giving informed consent.  The patient was taken to Operating Room, identified  and the procedure verified as Laparoscopic Cholecystectomy.  A Time Out was held and the above information confirmed.  Prior to the induction of general anesthesia, antibiotic prophylaxis was administered. VTE prophylaxis was in place. General endotracheal anesthesia was then administered and tolerated well. After the induction, the abdomen was prepped with Chloraprep and draped in the sterile fashion. The patient was positioned in the supine position.  Cut down technique was used to enter the abdominal cavity and a Hasson trochar was placed after two vicryl stitches were anchored to the fascia. Pneumoperitoneum was then created with CO2 and tolerated well without any adverse changes in the patient's vital signs.  Three 8-mm ports were placed under direct vision. All skin incisions  were infiltrated with  a local anesthetic agent before making the incision and placing the trocars.   The patient was positioned  in reverse Trendelenburg, robot was brought to the surgical field and docked in the standard fashion.  We made sure all the instrumentation was kept indirect view at all times and that there were no collision between the arms. I scrubbed out and went to the console.  The gallbladder was identified, the fundus grasped and retracted cephalad. Adhesions were lysed bluntly. There were some thick adhesions from the duodenum to the infundibulum suggesting chronic inflammatory changes in the GB. The infundibulum was grasped and retracted laterally, exposing the peritoneum overlying the triangle of Calot. This was then divided and exposed in a blunt fashion. An extended critical view of the cystic duct and cystic artery was obtained.  The cystic duct was clearly identified and bluntly dissected.   Artery and duct were double clipped and divided. Using ICG cholangiography we visualize the cystic duct and CBD, no evidence of bile injuries. The gallbladder was taken from the gallbladder fossa in a retrograde fashion with the electrocautery.  Hemostasis was achieved with the electrocautery. nspection of the right upper quadrant was performed. No bleeding, bile duct injury or leak, or bowel injury was noted. Robotic instruments and robotic arms were undocked in the standard fashion.  I scrubbed back in.  The gallbladder was removed and placed in an Endocatch bag.   Pneumoperitoneum was released.  The periumbilical port site was closed with interrumpted 0 Vicryl sutures. 4-0 subcuticular Monocryl was used to close the skin. Dermabond was  applied.  The patient was then extubated and brought to the recovery room in stable condition. Sponge, lap, and needle counts were correct at closure and  at the conclusion of the case.               Sterling Big, MD, FACS

## 2022-04-15 NOTE — Consult Note (Signed)
Patient ID: Karina Anderson, female   DOB: [birth year], 51 y.o.   MRN: [id number]  HPI Karina Anderson is a 51 y.o. female seen in consultation at the request of Dr. Leonides Schanz. He endorses abdominal pain for about a week.  Pain is located in the upper abdomen and seems to be worsening after she eats heavy meals.  She does have associated nausea and apparently low-grade fever.  She came into the emergency room 5 days ago and work-up was performed at that time showing evidence of cholelithiasis without evidence of cholecystitis.  She was sent home with a follow-up.  She now comes back again last night with similar symptoms and worsening abdominal pain.  The pain is intermittent moderate intensity and sharp.  No evidence of obstructive jaundice.  No diarrhea. Endorses decreased appetite and nausea. Is able to perform more than 4 METS of activity without any shortness of breath or  chest pain.  She had a prior history of tubal ligation and seizure disorder. He did have a repeat CT scan as well as an ultrasound that I have personally reviewed showing evidence of gallstones without cholecystitis.  CT scan showed no other intra-abdominal pathology.  CBC and CMP is completely normal.    HPI  Past Medical History:  Diagnosis Date   Chronic lower back pain    Hydradenitis    Seizures (HCC)     Past Surgical History:  Procedure Laterality Date   DILATION AND CURETTAGE OF UTERUS     3 miscarriages   HYDRADENITIS EXCISION     R underarm   TUBAL LIGATION     TUMOR REMOVAL  07/2020   index finger on L hand    Family History  Problem Relation Age of Onset   Seizures Mother    Diabetes Mother    Heart attack Father    Hypertension Brother    Eczema Daughter    Anxiety disorder Maternal Grandmother    Hypertension Brother    Hypertension Brother    Hypertension Brother    Hypertension Brother     Social History Social History   Tobacco Use   Smoking status: Every Day    Packs/day: 0.50     Types: Cigarettes   Smokeless tobacco: Never   Tobacco comments:    Has smoked since the age of 51 yrs old.  Vaping Use   Vaping Use: Never used  Substance Use Topics   Alcohol use: No    Comment: "Rarely"   Drug use: No    Allergies  Allergen Reactions   Citrus Hives   Sulfamethoxazole-Trimethoprim Hives   Butorphanol Hives   Butorphanol Tartrate Hives   Contrast Media [Iodinated Contrast Media] Nausea And Vomiting   Food Other (See Comments)    Allergic to Citrus Fruits.   Ibuprofen    Ketorolac    Lorazepam Hives   Naproxen    Prochlorperazine Hives   Nsaids Rash    Current Facility-Administered Medications  Medication Dose Route Frequency Provider Last Rate Last Admin   0.9 %  sodium chloride infusion   Intravenous Continuous Ward, Kristen N, DO 125 mL/hr at 04/15/22 0349 New Bag at 04/15/22 0349   sodium chloride 0.9 % bolus 1,000 mL  1,000 mL Intravenous Once Jazzelle Zhang F, MD       Current Outpatient Medications  Medication Sig Dispense Refill   clotrimazole (LOTRIMIN) 1 % cream Apply 1 application topically 2 (two) times daily. 30 g 0   cyclobenzaprine (FLEXERIL) 10 MG tablet Take  1 tablet (10 mg total) by mouth 3 (three) times daily as needed for muscle spasms. (Patient not taking: Reported on 03/09/2022) 30 tablet 1   docusate sodium (COLACE) 100 MG capsule Take 1 tablet once or twice daily as needed for constipation while taking narcotic pain medicine 30 capsule 0   fluticasone (FLONASE) 50 MCG/ACT nasal spray Place 2 sprays into both nostrils daily. 16 g 6   hydrocortisone (ANUSOL-HC) 25 MG suppository Place 1 suppository (25 mg total) rectally 2 (two) times daily. 12 suppository 0   lidocaine (LIDODERM) 5 % Place 1 patch onto the skin daily. Remove & Discard patch within 12 hours or as directed by MD.  Failed Tylenol, allergic to NSAIDs. 30 patch 0   omeprazole (PRILOSEC OTC) 20 MG tablet Take 20 mg by mouth daily. 28 tablet 1   ondansetron (ZOFRAN-ODT) 4 MG  disintegrating tablet Allow 1-2 tablets to dissolve in your mouth every 8 hours as needed for nausea/vomiting 30 tablet 0   oxyCODONE-acetaminophen (PERCOCET) 5-325 MG tablet Take 2 tablets by mouth every 6 (six) hours as needed for severe pain. 16 tablet 0   phenytoin (DILANTIN) 100 MG ER capsule TAKE 1 CAPSULE(100 MG) BY MOUTH THREE TIMES DAILY 90 capsule 2   polyethylene glycol powder (GLYCOLAX/MIRALAX) 17 GM/SCOOP powder Take 17 g by mouth 2 (two) times daily as needed. 3350 g 1   traMADol (ULTRAM) 50 MG tablet Take 1 tablet (50 mg total) by mouth every 6 (six) hours as needed. 20 tablet 0     Review of Systems Full ROS  was asked and was negative except for the information on the HPI  Physical Exam Blood pressure (!) 139/100, pulse 88, temperature 98.6 F (37 C), temperature source Oral, resp. rate 18, last menstrual period 03/18/2022, SpO2 92 %. CONSTITUTIONAL: NAD. EYES: Pupils are equal, round, and reactive to light, EARS, NOSE, MOUTH AND THROAT: . The oral mucosa is pink and moist. Hearing is intact to voice. LYMPH NODES:  Lymph nodes in the neck are normal. RESPIRATORY:  Lungs are clear. There is normal respiratory effort, with equal breath sounds bilaterally, and without pathologic use of accessory muscles. CARDIOVASCULAR: Heart is regular without murmurs, gallops, or rubs. GI: The abdomen is  soft, Ultibro patient epigastric area without peritonitis no definitive Murphy sign. There are no palpable masses. There is no hepatosplenomegaly. There are normal bowel sounds in all quadrants. GU: Rectal deferred.   MUSCULOSKELETAL: Normal muscle strength and tone. No cyanosis or edema.   SKIN: Turgor is good and there are no pathologic skin lesions or ulcers. NEUROLOGIC: Motor and sensation is grossly normal. Cranial nerves are grossly intact. PSYCH:  Oriented to person, place and time. Affect is normal.  Data Reviewed  I have personally reviewed the patient's imaging, laboratory  findings and medical records.    Assessment/Plan Recurrent epigastric abdominal pain associated with gallstones.  Certainly can be symptomatic cholelithiasis although the clinical picture is not clear-cut.  I had a very honest and candid discussion with the patient regarding her disease process.  I cannot guarantee that by doing a cholecystectomy her symptoms will go away.  So Far the only positive pathology that we have found is gallstones.  There is no evidence of any other acute intra-abdominal pathology.  Discussed with the patient in detail about other options to include further GI work-up in terms of endoscopy or also performing a HIDA scan.  The patient is already tired of having pain and coming back to the emergency  room.  She is also convinced that her symptoms are related to her gallbladder.  She wants to have her cholecystectomy done before she goes home.  I had an extensive discussion with both the patient and the family about her current situation. I discussed the procedure in detail.  The patient was given Agricultural engineer.  We discussed the risks and benefits of a laparoscopic cholecystectomy and possible cholangiogram including, but not limited to bleeding, infection, injury to surrounding structures such as the intestine or liver, bile leak, retained gallstones, need to convert to an open procedure, prolonged diarrhea, blood clots such as  DVT, common bile duct injury, anesthesia risks, and possible need for additional procedures.  The likelihood of improvement in symptoms and return to the patient's normal status is good. We discussed the typical post-operative recovery course.  Note that I spent over 75 minutes in this encounter including personally reviewing imaging studies, coordination of her care, counseling, reviewing medical records, placing orders and performing appropriate mentation  Sterling Big, MD FACS General Surgeon 04/15/2022, 9:48 AM

## 2022-04-15 NOTE — Discharge Instructions (Addendum)
Laparoscopic Cholecystectomy, Care After   These instructions give you information on caring for yourself after your procedure. Your doctor may also give you more specific instructions. Call your doctor if you have any problems or questions after your procedure.  HOME CARE  Change your bandages (dressings) as told by your doctor.  Keep the wound dry and clean. Wash the wound gently with soap and water. Pat the wound dry with a clean towel.  Do not take baths, swim, or use hot tubs for 2 weeks, or as told by your doctor.  Only take medicine as told by your doctor.  Eat a normal diet as told by your doctor.  Do not lift anything heavier than 10 pounds (4.5 kg) until your doctor says it is okay.  Do not play contact sports for 1 week, or as told by your doctor. GET HELP IF:  Your wound is red, puffy (swollen), or painful.  You have yellowish-white fluid (pus) coming from the wound.  You have fluid draining from the wound for more than 1 day.  You have a bad smell coming from the wound.  Your wound breaks open. GET HELP RIGHT AWAY IF:  You have trouble breathing.  You have chest pain.  You have a fever >101  You have pain in the shoulders (shoulder strap areas) that is getting worse.  You feel dizzy or pass out (faint).  You have severe belly (abdominal) pain.  You feel sick to your stomach (nauseous) or throw up (vomit) for more than 1 day.    AMBULATORY SURGERY  DISCHARGE INSTRUCTIONS   The drugs that you were given will stay in your system until tomorrow so for the next 24 hours you should not:  Drive an automobile Make any legal decisions Drink any alcoholic beverage   You may resume regular meals tomorrow.  Today it is better to start with liquids and gradually work up to solid foods.  You may eat anything you prefer, but it is better to start with liquids, then soup and crackers, and gradually work up to solid foods.   Please notify your doctor immediately if you have any  unusual bleeding, trouble breathing, redness and pain at the surgery site, drainage, fever, or pain not relieved by medication.   Information for Discharge Teaching:  DO NOT REMOVE TEAL 4 DAYS (96 hours) 04/19/2022 EXPAREL (bupivacaine liposome injectable suspension)   Your surgeon or anesthesiologist gave you EXPAREL(bupivacaine) to help control your pain after surgery.  EXPAREL is a local anesthetic that provides pain relief by numbing the tissue around the surgical site. EXPAREL is designed to release pain medication over time and can control pain for up to 72 hours. Depending on how you respond to EXPAREL, you may require less pain medication during your recovery.  Possible side effects: Temporary loss of sensation or ability to move in the area where bupivacaine was injected. Nausea, vomiting, constipation Rarely, numbness and tingling in your mouth or lips, lightheadedness, or anxiety may occur. Call your doctor right away if you think you may be experiencing any of these sensations, or if you have other questions regarding possible side effects.  Follow all other discharge instructions given to you by your surgeon or nurse. Eat a healthy diet and drink plenty of water or other fluids.  If you return to the hospital for any reason within 96 hours following the administration of EXPAREL, it is important for health care providers to know that you have received this anesthetic. A  teal colored band has been placed on your arm with the date, time and amount of EXPAREL you have received in order to alert and inform your health care providers. Please leave this armband in place for the full 96 hours following administration, and then you may remove the band.        Please contact your physician with any problems or Same Day Surgery at [phone], Monday through Friday 6 am to 4 pm, or [place] at [facility] number at [phone].

## 2022-04-15 NOTE — ED Provider Notes (Signed)
[facility] Provider Note    Event Date/Time   First MD Initiated Contact with Patient 04/15/22 [id number]     (approximate)   History   Abdominal Pain   HPI  Karina Anderson is a 51 y.o. female with history of seizures on Dilantin, chronic back pain not on chronic narcotic pain medication, hidradenitis who presents to the emergency department with complaints of upper abdominal pain, nausea and vomiting.  Was seen here in the emergency department on 04/10/2022 for the same and diagnosed with gallstones.  Discharged with prescriptions for oxycodone and Zofran but states these have not been helping with her symptoms.  States she continues to have severe pain and vomiting at home.  No diarrhea.  Reports fever today of 102.  No chest pain or shortness of breath.  No cough or congestion.  No dysuria, hematuria, vaginal bleeding or discharge.  Has had previous BTL and D&C x2.   History provided by patient and daughter.    Past Medical History:  Diagnosis Date   Chronic lower back pain    Hydradenitis    Seizures (HCC)     Past Surgical History:  Procedure Laterality Date   DILATION AND CURETTAGE OF UTERUS     3 miscarriages   HYDRADENITIS EXCISION     R underarm   TUBAL LIGATION     TUMOR REMOVAL  07/2020   index finger on L hand    MEDICATIONS:  Prior to Admission medications   Medication Sig Start Date End Date Taking? Authorizing Provider  clotrimazole (LOTRIMIN) 1 % cream Apply 1 application topically 2 (two) times daily. 04/27/21   McElwee, Lauren A, NP  cyclobenzaprine (FLEXERIL) 10 MG tablet Take 1 tablet (10 mg total) by mouth 3 (three) times daily as needed for muscle spasms. Patient not taking: Reported on 03/09/2022 06/24/21   Gerre Scull, NP  docusate sodium (COLACE) 100 MG capsule Take 1 tablet once or twice daily as needed for constipation while taking narcotic pain medicine 04/10/22   Loleta Rose, MD  fluticasone [facility]) 50 MCG/ACT nasal  spray Place 2 sprays into both nostrils daily. 04/27/21   McElwee, Jake Church, NP  hydrocortisone (ANUSOL-HC) 25 MG suppository Place 1 suppository (25 mg total) rectally 2 (two) times daily. 08/26/21   Johnson, Megan P, DO  lidocaine (LIDODERM) 5 % Place 1 patch onto the skin daily. Remove & Discard patch within 12 hours or as directed by MD.  Failed Tylenol, allergic to NSAIDs. 03/16/22   Gabriel Cirri, NP  omeprazole (PRILOSEC OTC) 20 MG tablet Take 20 mg by mouth daily. 05/28/21   [provider]  ondansetron (ZOFRAN-ODT) 4 MG disintegrating tablet Allow 1-2 tablets to dissolve in your mouth every 8 hours as needed for nausea/vomiting 04/10/22   Loleta Rose, MD  oxyCODONE-acetaminophen (PERCOCET) 5-325 MG tablet Take 2 tablets by mouth every 6 (six) hours as needed for severe pain. 04/10/22   Loleta Rose, MD  phenytoin (DILANTIN) 100 MG ER capsule TAKE 1 CAPSULE(100 MG) BY MOUTH THREE TIMES DAILY 04/26/21   Cannady, Corrie Dandy T, NP  polyethylene glycol powder (GLYCOLAX/MIRALAX) 17 GM/SCOOP powder Take 17 g by mouth 2 (two) times daily as needed. 09/22/21   Vigg, Avanti, MD  traMADol (ULTRAM) 50 MG tablet Take 1 tablet (50 mg total) by mouth every 6 (six) hours as needed. 10/25/21 10/25/22  Jene Every, MD    Physical Exam   Triage Vital Signs: ED Triage Vitals  Enc Vitals Group  BP 04/14/22 2328 (!) 111/98     Pulse Rate 04/14/22 2328 97     Resp 04/14/22 2328 18     Temp 04/14/22 2328 98.4 F (36.9 C)     Temp Source 04/14/22 2328 Oral     SpO2 04/14/22 2328 100 %     Weight --      Height --      Head Circumference --      Peak Flow --      Pain Score 04/14/22 2327 10     Pain Loc --      Pain Edu? --      Excl. in GC? --     Most recent vital signs: Vitals:   04/15/22 0530 04/15/22 0600  BP: 127/88 (!) 121/91  Pulse: 80 91  Resp:    Temp:    SpO2: 97% 93%    CONSTITUTIONAL: Alert and oriented and responds appropriately to questions. Well-appearing;  well-nourished HEAD: Normocephalic, atraumatic EYES: Conjunctivae clear, pupils appear equal, sclera nonicteric ENT: normal nose; moist mucous membranes NECK: Supple, normal ROM CARD: RRR; S1 and S2 appreciated; no murmurs, no clicks, no rubs, no gallops RESP: Normal chest excursion without splinting or tachypnea; breath sounds clear and equal bilaterally; no wheezes, no rhonchi, no rales, no hypoxia or respiratory distress, speaking full sentences ABD/GI: Normal bowel sounds; non-distended; soft, tender in the upper abdomen without guarding or rebound, no tenderness at McBurney's point, negative Murphy sign BACK: The back appears normal EXT: Normal ROM in all joints; no deformity noted, no edema; no cyanosis SKIN: Normal color for age and race; warm; no rash on exposed skin NEURO: Moves all extremities equally, normal speech PSYCH: The patient's mood and manner are appropriate.   ED Results / Procedures / Treatments   LABS: (all labs ordered are listed, but only abnormal results are displayed) Labs Reviewed  CBC WITH DIFFERENTIAL/PLATELET - Abnormal; Notable for the following components:      Result Value   Hemoglobin 11.7 (*)    All other components within normal limits  COMPREHENSIVE METABOLIC PANEL - Abnormal; Notable for the following components:   Potassium 3.2 (*)    Glucose, Bld 108 (*)    All other components within normal limits  URINALYSIS, ROUTINE W REFLEX MICROSCOPIC - Abnormal; Notable for the following components:   Color, Urine YELLOW (*)    APPearance HAZY (*)    All other components within normal limits  LIPASE, BLOOD  HCG, QUANTITATIVE, PREGNANCY  URINE DRUG SCREEN, QUALITATIVE ([facility] ONLY)  POC URINE PREG, ED     EKG:   RADIOLOGY: My personal review and interpretation of imaging: Ultrasound shows cholelithiasis without cholecystitis.  I have personally reviewed all radiology reports.   US ABDOMEN LIMITED RUQ (LIVER/GB)  Result Date:  04/15/2022 CLINICAL DATA:  Worsening upper abdominal pain and fever. EXAM: ULTRASOUND ABDOMEN LIMITED RIGHT UPPER QUADRANT COMPARISON:  April 09, 2022 FINDINGS: Gallbladder: Shadowing echogenic gallstones are seen within the gallbladder lumen. The largest measures approximately 7 mm. There is no evidence of gallbladder wall thickening (1.5 mm) no sonographic Murphy sign noted by sonographer. Common bile duct: Diameter: 3.1 mm Liver: No focal lesion identified. Within normal limits in parenchymal echogenicity. Portal vein is patent on color Doppler imaging with normal direction of blood flow towards the liver. Other: None. IMPRESSION: Cholelithiasis, without evidence of acute cholecystitis. Electronically Signed   By: Aram Candela M.D.   On: 04/15/2022 04:45     PROCEDURES:  Critical Care performed: No  Procedures    IMPRESSION / MDM / ASSESSMENT AND PLAN / ED COURSE  I reviewed the triage vital signs and the nursing notes.    Patient here with complaints of symptomatic cholelithiasis.  Reports fevers at home of 102.    DIFFERENTIAL DIAGNOSIS (includes but not limited to):   Cholecystitis, cholelithiasis, choledocholithiasis, cholangitis, pancreatitis, doubt appendicitis   Patient's presentation is most consistent with acute presentation with potential threat to life or bodily function.   PLAN: We will obtain right upper quadrant ultrasound.  CBC, CMP, lipase, urine obtained from triage.  No leukocytosis.  Normal LFTs and lipase.  Pregnancy test negative.  Will give IV fluids, morphine, Zofran.   MEDICATIONS GIVEN IN ED: Medications  0.9 %  sodium chloride infusion ( Intravenous New Bag/Given 04/15/22 0349)  morphine (PF) 4 MG/ML injection 4 mg (4 mg Intravenous Given 04/15/22 0348)  ondansetron (ZOFRAN) injection 4 mg (4 mg Intravenous Given 04/15/22 0349)  HYDROmorphone (DILAUDID) injection 1 mg (1 mg Intravenous Given 04/15/22 0551)     ED COURSE: Ultrasound reviewed  and interpreted by myself and the radiologist and shows cholelithiasis without cholecystitis, choledocholithiasis.  Patient reports pain was a 10/10 and is now an 8/10.  Will give Dilaudid.  She states she does not feel comfortable going home with oral pain medications and would like admission for cholecystectomy.  Discussed with patient that given she does not have cholecystitis that I cannot guarantee admission to the hospital for surgery for symptomatic cholelithiasis but will discuss with general surgery.  We will keep her n.p.o. and continue fluids.   CONSULTS: Discussed with Dr. Everlene Farrier with general surgery.  He will see patient in the morning in consultation to determine if patient needs admission.  Patient does report she has an outpatient appointment with general surgery already scheduled on 04/21/2022.  Surgery requesting urine drug screen.   OUTSIDE RECORDS REVIEWED: Reviewed patient's last family medicine note with Elana Alm on 03/13/2020.       FINAL CLINICAL IMPRESSION(S) / ED DIAGNOSES   Final diagnoses:  Symptomatic cholelithiasis     Rx / DC Orders   ED Discharge Orders     None        Note:  This document was prepared using Dragon voice recognition software and may include unintentional dictation errors.   Arran Fessel, Layla Maw, DO 04/15/22 [phone]

## 2022-04-15 NOTE — Anesthesia Preprocedure Evaluation (Signed)
Anesthesia Evaluation  Patient identified by MRN, date of birth, ID band Patient awake    Reviewed: Allergy & Precautions, NPO status , Patient's Chart, lab work & pertinent test results  Airway Mallampati: II  TM Distance: >3 FB Neck ROM: full    Dental  (+) Dental Advidsory Given, Teeth Intact   Pulmonary neg pulmonary ROS, neg COPD, Current SmokerPatient did not abstain from smoking.,    Pulmonary exam normal        Cardiovascular (-) angina(-) Past MI and (-) CABG negative cardio ROS Normal cardiovascular exam(-) pacemaker     Neuro/Psych PSYCHIATRIC DISORDERS    GI/Hepatic negative GI ROS, Neg liver ROS,   Endo/Other  negative endocrine ROS  Renal/GU      Musculoskeletal   Abdominal   Peds  Hematology negative hematology ROS (+)   Anesthesia Other Findings Past Medical History: No date: Chronic lower back pain No date: Hydradenitis No date: Seizures (HCC)  Past Surgical History: No date: DILATION AND CURETTAGE OF UTERUS     Comment:  3 miscarriages No date: HYDRADENITIS EXCISION     Comment:  R underarm No date: TUBAL LIGATION 07/2020: TUMOR REMOVAL     Comment:  index finger on L hand     Reproductive/Obstetrics negative OB ROS                             Anesthesia Physical Anesthesia Plan  ASA: 2  Anesthesia Plan: General   Post-op Pain Management:    Induction: Intravenous  PONV Risk Score and Plan: 4 or greater and Ondansetron, Dexamethasone, Midazolam and Treatment may vary due to age or medical condition  Airway Management Planned: Oral ETT  Additional Equipment:   Intra-op Plan:   Post-operative Plan: Extubation in OR  Informed Consent: I have reviewed the patients History and Physical, chart, labs and discussed the procedure including the risks, benefits and alternatives for the proposed anesthesia with the patient or authorized representative who has  indicated his/her understanding and acceptance.     Dental Advisory Given  Plan Discussed with: Anesthesiologist, CRNA and Surgeon  Anesthesia Plan Comments: (Patient consented for risks of anesthesia including but not limited to:  - adverse reactions to medications - damage to eyes, teeth, lips or other oral mucosa - nerve damage due to positioning  - sore throat or hoarseness - Damage to heart, brain, nerves, lungs, other parts of body or loss of life  Patient voiced understanding.)        Anesthesia Quick Evaluation

## 2022-04-15 NOTE — Anesthesia Postprocedure Evaluation (Signed)
Anesthesia Post Note  Patient: Health visitor) Performed: XI ROBOTIC ASSISTED LAPAROSCOPIC CHOLECYSTECTOMY (Abdomen) INDOCYANINE GREEN FLUORESCENCE IMAGING (ICG)  Patient location during evaluation: PACU Anesthesia Type: General Level of consciousness: awake and alert Pain management: pain level controlled Vital Signs Assessment: post-procedure vital signs reviewed and stable Respiratory status: spontaneous breathing, nonlabored ventilation, respiratory function stable and patient connected to nasal cannula oxygen Cardiovascular status: blood pressure returned to baseline and stable Postop Assessment: no apparent nausea or vomiting Anesthetic complications: no   No notable events documented.   Last Vitals:  Vitals:   04/15/22 1425 04/15/22 1448  BP: (!) 126/90 124/86  Pulse: 88 86  Resp: (!) 23 20  Temp:  (!) 36.1 C  SpO2: 93% 94%    Last Pain:  Vitals:   04/15/22 1448  TempSrc:   PainSc: 0-No pain                 Stephanie Coup

## 2022-04-15 NOTE — ED Provider Notes (Signed)
-----------------------------------------   7:23 AM on 04/15/2022 -----------------------------------------  Blood pressure (!) 139/100, pulse 88, temperature 98.6 F (37 C), temperature source Oral, resp. rate 18, last menstrual period 03/18/2022, SpO2 92 %.  Assuming care from Dr. Elesa Massed.  In short, Karina Anderson is a 51 y.o. female with a chief complaint of Abdominal Pain .  Refer to the original H&P for additional details.  The current plan of care is to follow-up surgery recommendations for suspected biliary colic.  ----------------------------------------- 11:13 AM on 04/15/2022 ----------------------------------------- Patient evaluated by general surgery, who recommended proceeding with CT scan of her abdomen/pelvis to rule out alternative pathology for her abdominal pain.  This was performed and unremarkable, general surgery will proceed with cholecystectomy later today.    Chesley Noon, MD 04/15/22 1113

## 2022-04-18 DIAGNOSIS — F321 Major depressive disorder, single episode, moderate: Secondary | ICD-10-CM | POA: Diagnosis not present

## 2022-04-18 LAB — SURGICAL PATHOLOGY

## 2022-04-19 ENCOUNTER — Ambulatory Visit: Payer: Medicaid Other | Admitting: Surgery

## 2022-04-19 DIAGNOSIS — F321 Major depressive disorder, single episode, moderate: Secondary | ICD-10-CM | POA: Diagnosis not present

## 2022-04-20 DIAGNOSIS — F321 Major depressive disorder, single episode, moderate: Secondary | ICD-10-CM | POA: Diagnosis not present

## 2022-04-21 ENCOUNTER — Encounter: Payer: Medicaid Other | Admitting: Physician Assistant

## 2022-04-21 ENCOUNTER — Ambulatory Visit: Payer: Medicaid Other | Admitting: Surgery

## 2022-04-21 DIAGNOSIS — F321 Major depressive disorder, single episode, moderate: Secondary | ICD-10-CM | POA: Diagnosis not present

## 2022-04-22 DIAGNOSIS — F321 Major depressive disorder, single episode, moderate: Secondary | ICD-10-CM | POA: Diagnosis not present

## 2022-04-23 DIAGNOSIS — F321 Major depressive disorder, single episode, moderate: Secondary | ICD-10-CM | POA: Diagnosis not present

## 2022-04-25 DIAGNOSIS — F321 Major depressive disorder, single episode, moderate: Secondary | ICD-10-CM | POA: Diagnosis not present

## 2022-04-26 DIAGNOSIS — F321 Major depressive disorder, single episode, moderate: Secondary | ICD-10-CM | POA: Diagnosis not present

## 2022-04-27 DIAGNOSIS — F321 Major depressive disorder, single episode, moderate: Secondary | ICD-10-CM | POA: Diagnosis not present

## 2022-04-28 DIAGNOSIS — F321 Major depressive disorder, single episode, moderate: Secondary | ICD-10-CM | POA: Diagnosis not present

## 2022-04-29 DIAGNOSIS — F321 Major depressive disorder, single episode, moderate: Secondary | ICD-10-CM | POA: Diagnosis not present

## 2022-04-30 DIAGNOSIS — F321 Major depressive disorder, single episode, moderate: Secondary | ICD-10-CM | POA: Diagnosis not present

## 2022-05-02 DIAGNOSIS — F321 Major depressive disorder, single episode, moderate: Secondary | ICD-10-CM | POA: Diagnosis not present

## 2022-05-03 DIAGNOSIS — R892 Abnormal level of other drugs, medicaments and biological substances in specimens from other organs, systems and tissues: Secondary | ICD-10-CM | POA: Insufficient documentation

## 2022-05-03 DIAGNOSIS — F321 Major depressive disorder, single episode, moderate: Secondary | ICD-10-CM | POA: Diagnosis not present

## 2022-05-03 NOTE — Progress Notes (Unsigned)
Patient: [name]  Service Category: Precharting review  Provider: Gaspar Cola, MD  DOB: [birth year]  DOS: 05/04/2022  Referring Provider: Kathrine Haddock, NP  MRN: [id number]  Setting: Ambulatory outpatient  PCP: [facility]  Type: New Patient  Specialty: Interventional Pain Management           NO-SHOW to pain management initial evaluation on 04/07/2021 & 05/04/2022.  Historic Controlled Substance Pharmacotherapy Review  PMP and historical list of controlled substances: Oxycodone/APAP 5/325 (# 25) (last filled on 04/18/2022); oxycodone/APAP 10/325 (# 12) (last filled 08/13/2021); pregabalin 75 mg capsule (# 30), 1 cap p.o. twice daily (last filled on 05/10/2021); oxycodone IR 5 mg tablet (# 9) (last filled on 03/11/2021); hydrocodone/APAP 5/325 (# 8) (last filled on 09/28/2020) Current opioid analgesics:   Oxycodone/APAP 5/325 (# 25) (last filled on 04/18/2022) MME/day: 93.75 mg/day  Historical Monitoring:  List of prior UDS Testing: Lab Results  Component Value Date   MDMA NONE DETECTED 04/15/2022   MDMA NONE DETECTED 11/29/2019   MDMA NONE DETECTED 03/11/2016   COCAINSCRNUR NONE DETECTED 04/15/2022   COCAINSCRNUR NONE DETECTED 11/29/2019   COCAINSCRNUR NONE DETECTED 03/11/2016   PCPSCRNUR NONE DETECTED 04/15/2022   PCPSCRNUR NONE DETECTED 11/29/2019   PCPSCRNUR NONE DETECTED 03/11/2016   THCU NONE DETECTED 04/15/2022   THCU NONE DETECTED 11/29/2019   THCU POSITIVE (A) 03/11/2016   ETH <10 11/29/2019   ETH <5 03/11/2016   Historical Background Evaluation: [place] PMP: PDMP reviewed  PMP NARX Score Report:  Narcotic: 270 Sedative: 170 Stimulant: 000 [place] Department of public safety, offender search: Editor, commissioning Information) Non-contributory Risk Assessment Profile:  PMP NARX Overdose Risk Score: 420  Meds   Current Outpatient Medications:    clotrimazole (LOTRIMIN) 1 % cream, Apply 1 application topically 2 (two) times daily., Disp: 30 g, Rfl: 0   docusate sodium  (COLACE) 100 MG capsule, Take 1 tablet once or twice daily as needed for constipation while taking narcotic pain medicine, Disp: 30 capsule, Rfl: 0   fluticasone (FLONASE) 50 MCG/ACT nasal spray, Place 2 sprays into both nostrils daily., Disp: 16 g, Rfl: 6   hydrocortisone (ANUSOL-HC) 25 MG suppository, Place 1 suppository (25 mg total) rectally 2 (two) times daily., Disp: 12 suppository, Rfl: 0   lidocaine (LIDODERM) 5 %, Place 1 patch onto the skin daily. Remove & Discard patch within 12 hours or as directed by MD.  Failed Tylenol, allergic to NSAIDs., Disp: 30 patch, Rfl: 0   omeprazole (PRILOSEC OTC) 20 MG tablet, Take 20 mg by mouth daily., Disp: 28 tablet, Rfl: 1   ondansetron (ZOFRAN-ODT) 4 MG disintegrating tablet, Allow 1-2 tablets to dissolve in your mouth every 8 hours as needed for nausea/vomiting, Disp: 30 tablet, Rfl: 0   oxyCODONE-acetaminophen (PERCOCET) 5-325 MG tablet, Take 1-2 tablets by mouth every 4 (four) hours as needed for severe pain., Disp: 25 tablet, Rfl: 0   phenytoin (DILANTIN) 100 MG ER capsule, TAKE 1 CAPSULE(100 MG) BY MOUTH THREE TIMES DAILY, Disp: 90 capsule, Rfl: 2   polyethylene glycol powder (GLYCOLAX/MIRALAX) 17 GM/SCOOP powder, Take 17 g by mouth 2 (two) times daily as needed., Disp: 3350 g, Rfl: 1   traMADol (ULTRAM) 50 MG tablet, Take 1 tablet (50 mg total) by mouth every 6 (six) hours as needed., Disp: 20 tablet, Rfl: 0  Imaging Review  Lumbosacral Imaging: Lumbar DG (Complete) 4+V: Results for orders placed during the hospital encounter of 04/08/17 DG Lumbar Spine Complete  Narrative CLINICAL DATA:  51 year old female with pressure  in the low back for the past 4 months.  EXAM: LUMBAR SPINE - COMPLETE 4+ VIEW  COMPARISON:  No priors.  FINDINGS: There is no evidence of lumbar spine fracture. Alignment is normal. Intervertebral disc spaces are maintained. Tubal ligation clips noted in the low anatomic  pelvis.  IMPRESSION: Negative.   Electronically Signed By: Vinnie Langton M.D. On: 04/08/2017 19:34  Knee Imaging: Knee-L DG 4 views: Results for orders placed during the hospital encounter of 12/11/19 DG Knee Complete 4 Views Left  Narrative CLINICAL DATA:  Pain and swelling  EXAM: LEFT KNEE - COMPLETE 4+ VIEW  COMPARISON:  None.  FINDINGS: Frontal, lateral, and bilateral oblique views were obtained. There is no appreciable fracture, dislocation, or effusion. No joint space narrowing or erosion.  IMPRESSION: No fracture, dislocation, or effusion.  No evident arthropathy.   Electronically Signed By: Lowella Grip III M.D. On: 12/11/2019 13:00  Foot Imaging: Foot-R DG Complete: Results for orders placed during the hospital encounter of 02/28/17 DG Foot Complete Right  Narrative CLINICAL DATA:  Direct trauma to right foot 20 center block fell upon it.  EXAM: RIGHT FOOT COMPLETE - 3+ VIEW  COMPARISON:  Right foot series of October 27, 2016  FINDINGS: The bones are subjectively adequately mineralized. There is no acute fracture nor dislocation. Hallux valgus contour of the first ray persists. The other MTP joints as well as the IP joints are well maintained. The TMT and intertarsal joint spaces are also well maintained. There is mild soft tissue swelling over the metatarsal region dorsally.  IMPRESSION: There is no acute bony abnormality of the right foot. There is mild overlying soft tissue swelling in the metatarsal region. Stable hallux valgus contour of the first ray.   Electronically Signed By: David  Martinique M.D. On: 02/28/2017 16:01  Complexity Note: Imaging results reviewed.                          Laboratory Chemistry Profile   Renal Lab Results  Component Value Date   BUN 15 04/14/2022   CREATININE 1.00 04/14/2022   BCR 18 12/01/2020   GFRAA >60 02/24/2020   GFRNONAA >60 04/14/2022   SPECGRAV 1.025 04/01/2021   PHUR 5.5  04/01/2021   PROTEINUR NEGATIVE 04/15/2022     Electrolytes Lab Results  Component Value Date   NA 139 04/14/2022   K 3.2 (L) 04/14/2022   CL 101 04/14/2022   CALCIUM 9.7 04/14/2022   MG 1.8 11/29/2019     Hepatic Lab Results  Component Value Date   AST 15 04/14/2022   ALT 8 04/14/2022   ALBUMIN 3.9 04/14/2022   ALKPHOS 69 04/14/2022   LIPASE 21 04/14/2022     ID Lab Results  Component Value Date   HIV Non Reactive 03/09/2022   SARSCOV2NAA Detected (A) 03/24/2021   PREGTESTUR NEGATIVE 04/15/2022     Bone No results found for: "VD25OH", "VD125OH2TOT", "PY1950DT2", "IZ1245YK9", "25OHVITD1", "25OHVITD2", "98PJASNK5", "TESTOFREE", "TESTOSTERONE"   Endocrine Lab Results  Component Value Date   GLUCOSE 108 (H) 04/14/2022   GLUCOSEU NEGATIVE 04/15/2022   HGBA1C 5.4 12/31/2020   TSH 1.790 12/31/2020     Neuropathy Lab Results  Component Value Date   HGBA1C 5.4 12/31/2020   HIV Non Reactive 03/09/2022     CNS No results found for: "COLORCSF", "APPEARCSF", "RBCCOUNTCSF", "WBCCSF", "POLYSCSF", "LYMPHSCSF", "EOSCSF", "PROTEINCSF", "GLUCCSF", "JCVIRUS", "CSFOLI", "IGGCSF", "LABACHR", "ACETBL"   Inflammation (CRP: Acute  ESR: Chronic) No results found for: "  CRP", "ESRSEDRATE", "LATICACIDVEN"   Rheumatology No results found for: "RF", "ANA", "LABURIC", "URICUR", "LYMEIGGIGMAB", "LYMEABIGMQN", "HLAB27"   Coagulation Lab Results  Component Value Date   PLT 318 04/14/2022     Cardiovascular Lab Results  Component Value Date   TROPONINI <0.03 12/24/2017   HGB 11.7 (L) 04/14/2022   HCT 36.7 04/14/2022     Screening Lab Results  Component Value Date   SARSCOV2NAA Detected (A) 03/24/2021   HIV Non Reactive 03/09/2022   PREGTESTUR NEGATIVE 04/15/2022     Cancer No results found for: "CEA", "CA125", "LABCA2"   Allergens No results found for: "ALMOND", "APPLE", "ASPARAGUS", "AVOCADO", "BANANA", "BARLEY", "BASIL", "BAYLEAF", "GREENBEAN", "LIMABEAN", "WHITEBEAN",  "BEEFIGE", "REDBEET", "BLUEBERRY", "BROCCOLI", "CABBAGE", "MELON", "CARROT", "CASEIN", "CASHEWNUT", "CAULIFLOWER", "CELERY"     Note: Lab results reviewed.  [place]   Past Surgical History:  Procedure Laterality Date   DILATION AND CURETTAGE OF UTERUS     3 miscarriages   HYDRADENITIS EXCISION     R underarm   TUBAL LIGATION     TUMOR REMOVAL  07/2020   index finger on L hand   Active Ambulatory Problems    Diagnosis Date Noted   Seizures ([place]) 03/11/2016   Involuntary commitment 03/11/2016   Bipolar affective disorder, manic, severe, with psychotic behavior ([place]) 03/12/2016   Tobacco use disorder 03/13/2016   Cannabis use disorder, moderate, dependence ([place]) 03/13/2016   History of psychosis 12/01/2020   History of suicide attempt 12/01/2020   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   Impaired fasting glucose 12/31/2020   Diarrhea 03/23/2021   Nausea and vomiting 03/23/2021   Arthritis of left knee 04/01/2021   Bone lesion 02/28/2020   Syphilis, unspecified 08/29/1898   Chronic pain syndrome 04/07/2021   Pharmacologic therapy 04/07/2021   Disorder of skeletal system 04/07/2021   Problems influencing health status 88/67/7373   Folliculitis 66/81/5947   Chronic back pain 11/08/2021   Abnormal drug screen ([facility]) 05/03/2022   Resolved Ambulatory Problems    Diagnosis Date Noted   Pain in gums 01/18/2021   Past Medical History:  Diagnosis Date   Chronic lower back pain    Hydradenitis    Note by: Gaspar Cola, MD Date: 05/04/2022; Time: 11:46 AM

## 2022-05-04 ENCOUNTER — Ambulatory Visit (HOSPITAL_BASED_OUTPATIENT_CLINIC_OR_DEPARTMENT_OTHER): Payer: Medicaid Other | Admitting: Pain Medicine

## 2022-05-04 DIAGNOSIS — Z91199 Patient's noncompliance with other medical treatment and regimen due to unspecified reason: Secondary | ICD-10-CM

## 2022-05-04 DIAGNOSIS — F321 Major depressive disorder, single episode, moderate: Secondary | ICD-10-CM | POA: Diagnosis not present

## 2022-05-04 DIAGNOSIS — R892 Abnormal level of other drugs, medicaments and biological substances in specimens from other organs, systems and tissues: Secondary | ICD-10-CM

## 2022-05-04 DIAGNOSIS — G894 Chronic pain syndrome: Secondary | ICD-10-CM

## 2022-05-04 DIAGNOSIS — Z789 Other specified health status: Secondary | ICD-10-CM

## 2022-05-04 DIAGNOSIS — M899 Disorder of bone, unspecified: Secondary | ICD-10-CM

## 2022-05-04 DIAGNOSIS — Z79899 Other long term (current) drug therapy: Secondary | ICD-10-CM

## 2022-05-05 ENCOUNTER — Encounter: Payer: Self-pay | Admitting: Pain Medicine

## 2022-05-05 DIAGNOSIS — F321 Major depressive disorder, single episode, moderate: Secondary | ICD-10-CM | POA: Diagnosis not present

## 2022-05-06 DIAGNOSIS — F321 Major depressive disorder, single episode, moderate: Secondary | ICD-10-CM | POA: Diagnosis not present

## 2022-05-07 DIAGNOSIS — F321 Major depressive disorder, single episode, moderate: Secondary | ICD-10-CM | POA: Diagnosis not present

## 2022-05-09 DIAGNOSIS — F321 Major depressive disorder, single episode, moderate: Secondary | ICD-10-CM | POA: Diagnosis not present

## 2022-05-10 DIAGNOSIS — F321 Major depressive disorder, single episode, moderate: Secondary | ICD-10-CM | POA: Diagnosis not present

## 2022-05-11 DIAGNOSIS — F321 Major depressive disorder, single episode, moderate: Secondary | ICD-10-CM | POA: Diagnosis not present

## 2022-05-12 DIAGNOSIS — F321 Major depressive disorder, single episode, moderate: Secondary | ICD-10-CM | POA: Diagnosis not present

## 2022-05-13 DIAGNOSIS — F321 Major depressive disorder, single episode, moderate: Secondary | ICD-10-CM | POA: Diagnosis not present

## 2022-05-14 DIAGNOSIS — F321 Major depressive disorder, single episode, moderate: Secondary | ICD-10-CM | POA: Diagnosis not present

## 2022-05-16 DIAGNOSIS — F321 Major depressive disorder, single episode, moderate: Secondary | ICD-10-CM | POA: Diagnosis not present

## 2022-05-17 DIAGNOSIS — F321 Major depressive disorder, single episode, moderate: Secondary | ICD-10-CM | POA: Diagnosis not present

## 2022-05-18 DIAGNOSIS — F321 Major depressive disorder, single episode, moderate: Secondary | ICD-10-CM | POA: Diagnosis not present

## 2022-05-19 DIAGNOSIS — F321 Major depressive disorder, single episode, moderate: Secondary | ICD-10-CM | POA: Diagnosis not present

## 2022-05-20 DIAGNOSIS — F321 Major depressive disorder, single episode, moderate: Secondary | ICD-10-CM | POA: Diagnosis not present

## 2022-05-21 DIAGNOSIS — F321 Major depressive disorder, single episode, moderate: Secondary | ICD-10-CM | POA: Diagnosis not present

## 2022-05-23 DIAGNOSIS — F321 Major depressive disorder, single episode, moderate: Secondary | ICD-10-CM | POA: Diagnosis not present

## 2022-05-24 DIAGNOSIS — F321 Major depressive disorder, single episode, moderate: Secondary | ICD-10-CM | POA: Diagnosis not present

## 2022-05-25 DIAGNOSIS — F321 Major depressive disorder, single episode, moderate: Secondary | ICD-10-CM | POA: Diagnosis not present

## 2022-05-26 ENCOUNTER — Ambulatory Visit: Payer: Self-pay | Admitting: *Deleted

## 2022-05-26 ENCOUNTER — Ambulatory Visit: Payer: Medicaid Other | Admitting: Nurse Practitioner

## 2022-05-26 ENCOUNTER — Telehealth: Payer: Self-pay

## 2022-05-26 ENCOUNTER — Encounter: Payer: Self-pay | Admitting: Nurse Practitioner

## 2022-05-26 DIAGNOSIS — A539 Syphilis, unspecified: Secondary | ICD-10-CM

## 2022-05-26 DIAGNOSIS — Z113 Encounter for screening for infections with a predominantly sexual mode of transmission: Secondary | ICD-10-CM | POA: Diagnosis not present

## 2022-05-26 DIAGNOSIS — F321 Major depressive disorder, single episode, moderate: Secondary | ICD-10-CM | POA: Diagnosis not present

## 2022-05-26 LAB — HM HEPATITIS C SCREENING LAB: HM Hepatitis Screen: NEGATIVE

## 2022-05-26 LAB — HEPATITIS B SURFACE ANTIGEN: Hepatitis B Surface Ag: NONREACTIVE

## 2022-05-26 LAB — WET PREP FOR TRICH, YEAST, CLUE
Trichomonas Exam: NEGATIVE
Yeast Exam: NEGATIVE

## 2022-05-26 LAB — HM HIV SCREENING LAB: HM HIV Screening: NEGATIVE

## 2022-05-26 MED ORDER — PENICILLIN G BENZATHINE 1200000 UNIT/2ML IM SUSY
2.4000 10*6.[IU] | PREFILLED_SYRINGE | Freq: Once | INTRAMUSCULAR | Status: AC
  Administered 2022-05-26: 2.4 10*6.[IU] via INTRAMUSCULAR

## 2022-05-26 MED ORDER — PENICILLIN G BENZATHINE 1200000 UNIT/2ML IM SUSY
2.4000 10*6.[IU] | PREFILLED_SYRINGE | INTRAMUSCULAR | Status: AC
  Administered 2022-06-02 – 2022-06-09 (×2): 2.4 10*6.[IU] via INTRAMUSCULAR

## 2022-05-26 NOTE — Telephone Encounter (Signed)
Phone call to pt at [phone]. Pt confirmed identity. Discussed recent results reported from plasma facility.  Pt adamant that she had other syphilis testing completed at PCP, [facility] and at hospital, and no one reported any issues to her. Pt states last sex was approximately 6 months ago and has testing since them. Pt states she will call recent providers about testing and have records faxed to [facility]. Pt initially stated she was not going to get tested again.  When discussing syphilis, pt stated she has a history of it. She was approximately 51 years old at the time, was tested in [place] - believes it was [facility], and she received two shots on one day in her hip area.  Later in converstaion, states she is willing to be evaluated by [facility] but she will need transportation to be arranged by [facility]. She does not have a way to get to [facility]. Any day or time will be fine. She requests that appt info be texted to her phone, [phone].  Then phone call was disconnected.

## 2022-05-26 NOTE — Telephone Encounter (Addendum)
Discussed [facility] plasma report and pt conversation with Brion Aliment, DIS. DIS no longer provides transportation for patients, but pt needs further evaluation. Doren Custard does not see the past event in her system, but since event may have been in [place] over 30 years ago, may not have been scanned in.  Phone call to pt. Pt counseled that unable to see a syphilis lab completed by hospital or [place] in the system (RN found HIV done by [facility] 02/2022, not RPR). Pt states she will find a ride to [facility] and is leaving now to come to [facility]. Phone call disconnected.  Provider appt entered for 05/26/22. Clinic informed. DIS informed of appt.

## 2022-05-26 NOTE — Telephone Encounter (Signed)
  Chief Complaint:  Tested positive for syphillis at plasma center, Health Dept called pt this AM. Symptoms: Denies Frequency: Alerted this AM by Health Dept Pertinent Negatives: Patient denies any symptoms. Disposition: [] ED /[] Urgent Care (no appt availability in office) / [] Appointment(In office/virtual)/ []  [facility]/ [] Home Care/ [] Refused Recommended Disposition /[] [facility] [place] [facility]/ [x]  Follow-up with PCP Additional Notes: Pt has multiple questions, upset, difficult to redirect. Requesting lab work from practice be faxed to [facility] [employer]. States she had syphilis at age of 53yrs. Assured pt NT would route to practice for PCPs review. Please advise. Reason for Disposition  [1] Caller has URGENT medicine question about med that PCP or specialist prescribed AND [2] triager unable to answer question  Answer Assessment - Initial Assessment Questions 1. REASON FOR CALL or QUESTION: "What is your reason for calling today?" or "How can I best help you?" or "What question do you have that I can help answer?"     Tested positive for syphillis at plasma center, Health Dept called pt this AM.  Protocols used: Information Only Call - No Triage-A-AH, Medication Question Call-A-AH

## 2022-05-26 NOTE — Telephone Encounter (Signed)
[facility] received CD report from [place], plasma center in [place], [place]: 05/22/22 specimen date, source is Plasma Type of Test: STS Reactive - preliminary, resulted 05/24/22 (No titer, no confirmatory)  Result to be faxed to [place].  No previous RPR/syphilis/T.Pallidum found in Epic, CareEverywhere, or [employer].  Call pt to schedule [facility] provider visit for evaluation/further testing

## 2022-05-26 NOTE — Progress Notes (Signed)
Pt is here for STD screening.  Wet mount results reviewed, no treatment required per Provider.  Bicillin 2.4 MU given IM.  Pt tolerated well.  Pt refused to be monitored for 15 minutes.  Pt will return 10/2 and 10/9 for additional Bicillin injections.  Condoms given.  Windle Guard, RN

## 2022-05-26 NOTE — Progress Notes (Signed)
[facility] Department  STI clinic/screening visit [place] [address] [phone]  Subjective:  Karina Anderson is a 51 y.o. female being seen today for an STI screening visit. The patient reports they do not have symptoms.  Patient reports that they do not desire a pregnancy in the next year.   They reported they are not interested in discussing contraception today.    Patient's last menstrual period was 05/16/2022 (approximate).   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  Patient reports that she had Syphilis at age 79.  Patient had a positive RPR through the plasma center on 05/22/22.    Last HIV test per patient/review of record was 03/09/22 Patient reports last pap was 12/31/20.   Screening for MPX  risk: Does the patient have an unexplained rash? No Is the patient MSM? No Does the patient endorse multiple sex partners or anonymous sex partners? No Did the patient have close or sexual contact with a person diagnosed with MPX? No Has the patient traveled outside the [place] where MPX is endemic? No Is there a high clinical suspicion for MPX-- evidenced by one of the following No  -Unlikely to be chickenpox  -Lymphadenopathy  -Rash that present in same phase of evolution on any given body part See flowsheet for further details and programmatic requirements.   Immunization history:  Immunization History  Administered Date(s) Administered   Td 08/29/2016     The following portions of the patient's history were reviewed and updated as appropriate: allergies, current medications, past medical history, past social history, past surgical history and problem list.  Objective:  There were no vitals filed for this visit.  Physical Exam Constitutional:      Appearance: Normal appearance.  HENT:     Head: Normocephalic. No abrasion, masses or laceration. Hair is normal.     Right Ear: External ear normal.     Left Ear: External ear normal.     Nose: Nose normal.     Mouth/Throat:     Lips: Pink.     Mouth: Mucous membranes are moist. No oral lesions.     Pharynx: No oropharyngeal exudate or posterior oropharyngeal erythema.     Tonsils: No tonsillar exudate or tonsillar abscesses.     Comments: No visible signs of dental caries  Eyes:  General: Lids are normal.        Right eye: No discharge.        Left eye: No discharge.     Conjunctiva/sclera: Conjunctivae normal.     Right eye: No exudate.    Left eye: No exudate. Abdominal:     General: Abdomen is flat.     Palpations: Abdomen is soft.     Tenderness: There is no abdominal tenderness. There is no rebound.  Genitourinary:    Pubic Area: No rash or pubic lice.      Labia:        Right: No rash, tenderness, lesion or  injury.        Left: No rash, tenderness, lesion or injury.      Vagina: Normal. No vaginal discharge, erythema or lesions.     Cervix: No cervical motion tenderness, discharge, lesion or erythema.     Uterus: Not enlarged and not tender.      Rectum: Normal.     Comments: Amount Discharge: small  Odor: No pH: greater than 4.5 Adheres to vaginal wall: No Color: color of discharge matches the Everett Ehrler swab Musculoskeletal:     Cervical back: Full passive range of motion without pain, normal range of motion and neck supple.  Lymphadenopathy:     Cervical: No cervical adenopathy.     Right cervical: No superficial, deep or posterior cervical adenopathy.    Left cervical: No superficial, deep or posterior cervical adenopathy.     Upper Body:     Right upper body: No supraclavicular, axillary or epitrochlear adenopathy.     Left upper body: No supraclavicular, axillary or epitrochlear adenopathy.     Lower Body: No right inguinal adenopathy. No left inguinal adenopathy.  Skin:    General: Skin is warm and dry.     Findings: No lesion or rash.  Neurological:     Mental Status: She is alert and oriented to person, place, and time.  Psychiatric:        Attention and Perception: Attention normal.        Mood and Affect: Mood normal.        Speech: Speech normal.        Behavior: Behavior normal. Behavior is cooperative.      Assessment and Plan:  Karina Anderson is a 51 y.o. female presenting to the [facility] Department for STI screening  1. Screening examination for venereal disease -51 year old female in clinic for STD screening. -Patient accepted all screenings including vaginal CT/GC, wet prep and bloodwork for HIV/RPR.  Patient meets criteria for HepB screening? Yes. Ordered? Yes Patient meets criteria for HepC screening? Yes. Ordered? Yes  Treat wet prep per standing order Discussed time line for State Lab results and that patient will be called with positive results  and encouraged patient to call if she had not heard in 2 weeks.  Counseled to return or seek care for continued or worsening symptoms Recommended condom use with all sex  Patient is currently not using  contraception  to prevent pregnancy.  Patient has had a tubal.  - HIV/HCV [place] - Syphilis Serology, [place] [facility] - HBV Antigen/Antibody State Lab - Chlamydia/Gonorrhea [place] [facility] - WET PREP FOR TRICH, YEAST, CLUE   2. Syphilis -Patient reports that she had Syphilis at age 49.  Patient had a positive RPR through the plasma center on 05/22/22.  Patient informed that we will obtain a confirmatory RPR today and due  to no recent or history of previous positive RPR patient will be treated with 3 doses of Bicillin 2.4 million units.  Patient informed that confirmatory will always be positive due to history of Syphilis.  Patient instructed not to have sex for 14 days after completion of 3 doses.    - penicillin g benzathine (BICILLIN LA) 1200000 UNIT/2ML injection 2.4 Million Units   Total time spent: 30 minutes   Return in about 1 week (around 06/02/2022).  Future Appointments  Date Time Provider Department Center  06/02/2022 10:00 AM [facility] PROVIDER [facility] None    Glenna Fellows, FNP

## 2022-05-26 NOTE — Telephone Encounter (Signed)
Patient has been dismissed from the practice.

## 2022-05-27 DIAGNOSIS — F321 Major depressive disorder, single episode, moderate: Secondary | ICD-10-CM | POA: Diagnosis not present

## 2022-05-28 DIAGNOSIS — F321 Major depressive disorder, single episode, moderate: Secondary | ICD-10-CM | POA: Diagnosis not present

## 2022-05-30 DIAGNOSIS — F321 Major depressive disorder, single episode, moderate: Secondary | ICD-10-CM | POA: Diagnosis not present

## 2022-05-31 DIAGNOSIS — F321 Major depressive disorder, single episode, moderate: Secondary | ICD-10-CM | POA: Diagnosis not present

## 2022-05-31 NOTE — Telephone Encounter (Addendum)
Pt to [facility] provider clinic on 05/26/22. Pt bloodwork is pending. Received Bicillin LA 2.4 MU at visit.  Bicillin x 3 ordered by provider during 05/26/22 visit: #1 = administered 05/26/22 #2 = administered 06/02/22 #3 = administered 06/09/22

## 2022-06-01 DIAGNOSIS — F321 Major depressive disorder, single episode, moderate: Secondary | ICD-10-CM | POA: Diagnosis not present

## 2022-06-02 ENCOUNTER — Ambulatory Visit: Payer: Medicaid Other | Admitting: Nurse Practitioner

## 2022-06-02 DIAGNOSIS — A539 Syphilis, unspecified: Secondary | ICD-10-CM

## 2022-06-02 DIAGNOSIS — F321 Major depressive disorder, single episode, moderate: Secondary | ICD-10-CM | POA: Diagnosis not present

## 2022-06-02 DIAGNOSIS — Z113 Encounter for screening for infections with a predominantly sexual mode of transmission: Secondary | ICD-10-CM | POA: Diagnosis not present

## 2022-06-02 NOTE — Progress Notes (Signed)
S: 51 year old female in clinic today for treatment for Syphilis.  Last treatment was 05/26/22.  O: Well-appearing female, asymptomatic, alert and oriented x 3  A:  Syphilis    P: -Patient tolerated injection well.    -No sex for 14 days after completion of all 3 doses     -Recommended condoms with all sex.    -RTC in 1 week for next treatment.  Gregary Cromer, FNP

## 2022-06-03 ENCOUNTER — Encounter: Payer: Self-pay | Admitting: Nurse Practitioner

## 2022-06-03 DIAGNOSIS — F321 Major depressive disorder, single episode, moderate: Secondary | ICD-10-CM | POA: Diagnosis not present

## 2022-06-04 DIAGNOSIS — F321 Major depressive disorder, single episode, moderate: Secondary | ICD-10-CM | POA: Diagnosis not present

## 2022-06-06 ENCOUNTER — Telehealth: Payer: Self-pay

## 2022-06-06 DIAGNOSIS — F321 Major depressive disorder, single episode, moderate: Secondary | ICD-10-CM | POA: Diagnosis not present

## 2022-06-06 NOTE — Telephone Encounter (Signed)
Calling pt re syphilis result from 05/26/22 specimen.  RPR= Reactive Titer= 1:2 TP confirmatory= Reactive  See previous phone note. Pt already started bicillin x 3.

## 2022-06-07 ENCOUNTER — Other Ambulatory Visit: Payer: Self-pay

## 2022-06-07 DIAGNOSIS — F321 Major depressive disorder, single episode, moderate: Secondary | ICD-10-CM | POA: Diagnosis not present

## 2022-06-07 NOTE — Patient Outreach (Signed)
Medicaid Managed Care Social Work Note  06/07/2022 Name:  Karina Anderson MRN:  [id number] DOB:  10/12/1970  Karina Anderson is an 51 y.o. year old female who is a primary patient of Pcp, No.  The Medicaid Managed Care Coordination team was consulted for assistance with:  [employer]   Ms. Heupel was given information about [employer] team services today. [place] Patient agreed to services and verbal consent obtained.  Engaged with patient  for by telephone forinitial visit in response to referral for case management and/or care coordination services.   Assessments/Interventions:  Review of past medical history, allergies, medications, health status, including review of consultants reports, laboratory and other test data, was performed as part of comprehensive evaluation and provision of chronic care management services.  SDOH: (Social Determinant of Health) assessments and interventions performed: SDOH Interventions    Flowsheet Row Patient Outreach Telephone from 09/08/2021 in [place] Patient Outreach Telephone from 06/24/2021 in [place] Coordination  SDOH Interventions    Food Insecurity Interventions -- Intervention Not Indicated  Housing Interventions Other (Comment)  [Telephone call to [place] regarding increased financial strain/unable to pay rent for apartment this month] Other (Comment)  [Referral to [facility] BSW]  Transportation Interventions -- Intervention Not Indicated  Financial Strain Interventions Other (Comment)  [Telephone call to [place] regarding patient's increased financial strain over inability to pay rent this month.] Other (Comment)  [Referral to [facility] BSW]  Physical Activity Interventions -- Intervention Not Indicated  Social Connections Interventions -- Intervention Not Indicated     BSW received a referral from [facility] for assistance with utility  bill. BSW completed a telephone outreach with patient, she stated her utility bill was 3,000 and she has tried all agencies and churches in [employer] [place] [employer], she stated she spoke with [facility] and they provided her with a list of resources that she tried and they stated they did not have any funds. BSW is unable to locate any additional resources that may assistance with the $3,000 bill. Patient stated [employer] is not going to make any arrangements, she has to pay the full amount by 06/20/22.   Advanced Directives Status:  Not addressed in this encounter.  Care Plan                 Allergies  Allergen Reactions   Citrus Hives   Sulfamethoxazole-Trimethoprim Hives   Butorphanol Hives   Butorphanol Tartrate Hives   Contrast Media [Iodinated Contrast Media] Nausea And Vomiting   Food Other (See Comments)    Allergic to Citrus Fruits.   Ibuprofen    Ketorolac    Lorazepam Hives   Naproxen    Prochlorperazine Hives   Nsaids Rash    Medications Reviewed Today     Reviewed by Gregary Cromer, FNP (Family Nurse Practitioner) on 06/03/22 at 1250  Med List Status: <None>   Medication Order Taking? Sig Documenting Provider Last Dose Status Informant  clotrimazole (LOTRIMIN) 1 % cream [id number] No Apply 1 application topically 2 (two) times daily.  Patient not taking: Reported on 05/26/2022   Charyl Dancer, NP Not Taking Active            Med Note [facility], Gwenette Greet A   Thu Jun 24, 2021  1:06 PM) Using PRN  docusate sodium (COLACE) 100 MG capsule [id number] No Take 1 tablet once or twice daily as needed for constipation while taking narcotic pain medicine  Patient not  taking: Reported on 05/26/2022   Loleta Rose, MD Not Taking Active   fluticasone [facility]) 50 MCG/ACT nasal spray [id number] No Place 2 sprays into both nostrils daily.  Patient not taking: Reported on 05/26/2022   Gerre Scull, NP Not Taking Active            Med Note [facility], Marisue Humble A   Thu Jun 24, 2021  1:07 PM) Using  PRN  hydrocortisone (ANUSOL-HC) 25 MG suppository [id number] No Place 1 suppository (25 mg total) rectally 2 (two) times daily.  Patient not taking: Reported on 05/26/2022   Dorcas Carrow, DO Not Taking Active   lidocaine (LIDODERM) 5 % [id number] No Place 1 patch onto the skin daily. Remove & Discard patch within 12 hours or as directed by MD.  Failed Tylenol, allergic to NSAIDs.  Patient not taking: Reported on 05/26/2022   Gabriel Cirri, NP Not Taking Active   omeprazole (PRILOSEC OTC) 20 MG tablet [id number] No Take 20 mg by mouth daily.  Patient not taking: Reported on 05/26/2022   [provider] Not Taking Active            Med Note Ailene Ravel, CRISTINA   Thu Aug 26, 2021  8:23 AM) prn  ondansetron (ZOFRAN-ODT) 4 MG disintegrating tablet [id number] No Allow 1-2 tablets to dissolve in your mouth every 8 hours as needed for nausea/vomiting  Patient not taking: Reported on 05/26/2022   Loleta Rose, MD Not Taking Active   oxyCODONE-acetaminophen (PERCOCET) 5-325 MG tablet [id number] No Take 1-2 tablets by mouth every 4 (four) hours as needed for severe pain.  Patient not taking: Reported on 05/26/2022   Sterling Big F, MD Not Taking Active   penicillin g benzathine (BICILLIN LA) 1200000 UNIT/2ML injection 2.4 Million Units [id number]   Glenna Fellows, FNP  Active   phenytoin (DILANTIN) 100 MG ER capsule [id number] No TAKE 1 CAPSULE(100 MG) BY MOUTH THREE TIMES DAILY Cannady, Jolene T, NP 04/14/2022 Active   polyethylene glycol powder (GLYCOLAX/MIRALAX) 17 GM/SCOOP powder [id number] No Take 17 g by mouth 2 (two) times daily as needed.  Patient not taking: Reported on 05/26/2022   Loura Pardon, MD Not Taking Active   traMADol (ULTRAM) 50 MG tablet [id number] No Take 1 tablet (50 mg total) by mouth every 6 (six) hours as needed.  Patient not taking: Reported on 05/26/2022   Jene Every, MD Not Taking Active             Patient Active Problem List   Diagnosis Date Noted   Abnormal drug  screen [facility]) 05/03/2022   Chronic back pain 11/08/2021   Folliculitis 06/29/2021   Chronic pain syndrome 04/07/2021   Pharmacologic therapy 04/07/2021   Disorder of skeletal system 04/07/2021   Problems influencing health status 04/07/2021   Arthritis of left knee 04/01/2021   Diarrhea 03/23/2021   Nausea and vomiting 03/23/2021   Impaired fasting glucose 12/31/2020   History of psychosis 12/01/2020   History of suicide attempt 12/01/2020   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  Follow up:  Patient agrees to Care Plan and Follow-up.      Gus Puma, BSW, [place] [facility]  [place]  High Risk Managed Medicaid Team  [phone]

## 2022-06-07 NOTE — Patient Instructions (Signed)
Visit Information  [name] was given information about Medicaid Managed Care team care coordination services as a part of their [place] Medicaid benefit. Karina Anderson verbally consented to engagement with the [facility] Managed Care team.   If you are experiencing a medical emergency, please call 911 or report to your local emergency department or urgent care.   If you have a non-emergency medical problem during routine business hours, please contact your provider's office and ask to speak with a nurse.   For questions related to your Karina Anderson, please call: [phone] or visit the homepage here: [email or web address]  If you would like to schedule transportation through your [facility], please call the following number at least 2 days in advance of your appointment: [phone]   Rides for urgent appointments can also be made after hours by calling Member Services.  Call the Karina Anderson at [phone], at any time, 24 hours a day, 7 days a week. If you are in danger or need immediate medical attention call 911.  If you would like help to quit smoking, call 1-800-QUIT-NOW 239 289 3253) OR Espaol: 1-855-Djelo-Ya (8-366-294-7654) o para ms informacin haga clic aqu or Text READY to 200-400 to register via text  Ms. Kulak - following are the goals we discussed in your visit today:   Goals Addressed   None      The  Patient                                              has been provided with contact information for the Managed Medicaid care management team and has been advised to call with any health related questions or concerns.   Mickel Fuchs, BSW, [place] Managed Medicaid Team  [phone]   Following is a copy of your plan of care:  There are no care plans that you recently  modified to display for this patient.

## 2022-06-08 DIAGNOSIS — F321 Major depressive disorder, single episode, moderate: Secondary | ICD-10-CM | POA: Diagnosis not present

## 2022-06-08 NOTE — Telephone Encounter (Signed)
Phone call to pt at [phone] and pt confirmed identity. Discussed syphilis RPR/titer/confirmatory results.  Pt plans to come 06/09/22 for her last tx.

## 2022-06-09 ENCOUNTER — Ambulatory Visit: Payer: Medicaid Other | Admitting: Nurse Practitioner

## 2022-06-09 ENCOUNTER — Encounter: Payer: Self-pay | Admitting: Nurse Practitioner

## 2022-06-09 DIAGNOSIS — F321 Major depressive disorder, single episode, moderate: Secondary | ICD-10-CM | POA: Diagnosis not present

## 2022-06-09 DIAGNOSIS — A539 Syphilis, unspecified: Secondary | ICD-10-CM

## 2022-06-09 DIAGNOSIS — Z113 Encounter for screening for infections with a predominantly sexual mode of transmission: Secondary | ICD-10-CM | POA: Diagnosis not present

## 2022-06-09 NOTE — Progress Notes (Signed)
Tolerated third set of Bicillin injections with minimal complaints of discomfort. Counseled to remain in exam room (or waiting room) for 15 minutes. Client declined to wait. Rich Number, RN

## 2022-06-10 DIAGNOSIS — F321 Major depressive disorder, single episode, moderate: Secondary | ICD-10-CM | POA: Diagnosis not present

## 2022-06-11 DIAGNOSIS — F321 Major depressive disorder, single episode, moderate: Secondary | ICD-10-CM | POA: Diagnosis not present

## 2022-06-13 DIAGNOSIS — F321 Major depressive disorder, single episode, moderate: Secondary | ICD-10-CM | POA: Diagnosis not present

## 2022-06-14 DIAGNOSIS — F321 Major depressive disorder, single episode, moderate: Secondary | ICD-10-CM | POA: Diagnosis not present

## 2022-06-15 DIAGNOSIS — F321 Major depressive disorder, single episode, moderate: Secondary | ICD-10-CM | POA: Diagnosis not present

## 2022-06-16 DIAGNOSIS — F321 Major depressive disorder, single episode, moderate: Secondary | ICD-10-CM | POA: Diagnosis not present

## 2022-06-17 DIAGNOSIS — F321 Major depressive disorder, single episode, moderate: Secondary | ICD-10-CM | POA: Diagnosis not present

## 2022-06-18 DIAGNOSIS — F321 Major depressive disorder, single episode, moderate: Secondary | ICD-10-CM | POA: Diagnosis not present

## 2022-06-20 DIAGNOSIS — F321 Major depressive disorder, single episode, moderate: Secondary | ICD-10-CM | POA: Diagnosis not present

## 2022-06-21 DIAGNOSIS — F321 Major depressive disorder, single episode, moderate: Secondary | ICD-10-CM | POA: Diagnosis not present

## 2022-06-22 DIAGNOSIS — F321 Major depressive disorder, single episode, moderate: Secondary | ICD-10-CM | POA: Diagnosis not present

## 2022-06-23 DIAGNOSIS — F321 Major depressive disorder, single episode, moderate: Secondary | ICD-10-CM | POA: Diagnosis not present

## 2022-06-24 DIAGNOSIS — F321 Major depressive disorder, single episode, moderate: Secondary | ICD-10-CM | POA: Diagnosis not present

## 2022-06-25 DIAGNOSIS — F321 Major depressive disorder, single episode, moderate: Secondary | ICD-10-CM | POA: Diagnosis not present

## 2022-06-27 DIAGNOSIS — F321 Major depressive disorder, single episode, moderate: Secondary | ICD-10-CM | POA: Diagnosis not present

## 2022-06-28 DIAGNOSIS — F321 Major depressive disorder, single episode, moderate: Secondary | ICD-10-CM | POA: Diagnosis not present

## 2022-06-29 DIAGNOSIS — F321 Major depressive disorder, single episode, moderate: Secondary | ICD-10-CM | POA: Diagnosis not present

## 2022-06-30 DIAGNOSIS — F321 Major depressive disorder, single episode, moderate: Secondary | ICD-10-CM | POA: Diagnosis not present

## 2022-07-01 DIAGNOSIS — F321 Major depressive disorder, single episode, moderate: Secondary | ICD-10-CM | POA: Diagnosis not present

## 2022-07-02 DIAGNOSIS — F321 Major depressive disorder, single episode, moderate: Secondary | ICD-10-CM | POA: Diagnosis not present

## 2022-07-04 DIAGNOSIS — F321 Major depressive disorder, single episode, moderate: Secondary | ICD-10-CM | POA: Diagnosis not present

## 2022-07-05 DIAGNOSIS — F321 Major depressive disorder, single episode, moderate: Secondary | ICD-10-CM | POA: Diagnosis not present

## 2022-07-06 DIAGNOSIS — F321 Major depressive disorder, single episode, moderate: Secondary | ICD-10-CM | POA: Diagnosis not present

## 2022-07-07 DIAGNOSIS — F321 Major depressive disorder, single episode, moderate: Secondary | ICD-10-CM | POA: Diagnosis not present

## 2022-07-08 DIAGNOSIS — F321 Major depressive disorder, single episode, moderate: Secondary | ICD-10-CM | POA: Diagnosis not present

## 2022-07-09 DIAGNOSIS — F321 Major depressive disorder, single episode, moderate: Secondary | ICD-10-CM | POA: Diagnosis not present

## 2022-07-19 DIAGNOSIS — F321 Major depressive disorder, single episode, moderate: Secondary | ICD-10-CM | POA: Diagnosis not present

## 2022-07-25 DIAGNOSIS — F321 Major depressive disorder, single episode, moderate: Secondary | ICD-10-CM | POA: Diagnosis not present

## 2022-07-26 ENCOUNTER — Emergency Department
Admission: EM | Admit: 2022-07-26 | Discharge: 2022-07-26 | Discharge status: Against Medical Advice | Payer: Medicaid Other | Attending: Student in an Organized Health Care Education/Training Program | Admitting: Student in an Organized Health Care Education/Training Program

## 2022-07-26 ENCOUNTER — Other Ambulatory Visit: Payer: Self-pay

## 2022-07-26 DIAGNOSIS — R1033 Periumbilical pain: Secondary | ICD-10-CM | POA: Insufficient documentation

## 2022-07-26 DIAGNOSIS — Z5329 Procedure and treatment not carried out because of patient's decision for other reasons: Secondary | ICD-10-CM | POA: Insufficient documentation

## 2022-07-26 DIAGNOSIS — F321 Major depressive disorder, single episode, moderate: Secondary | ICD-10-CM | POA: Diagnosis not present

## 2022-07-26 LAB — COMPREHENSIVE METABOLIC PANEL
ALT: 10 U/L (ref 0–44)
AST: 15 U/L (ref 15–41)
Albumin: 3.8 g/dL (ref 3.5–5.0)
Alkaline Phosphatase: 74 U/L (ref 38–126)
Anion gap: 5 (ref 5–15)
BUN: 15 mg/dL (ref 6–20)
CO2: 30 mmol/L (ref 22–32)
Calcium: 9.4 mg/dL (ref 8.9–10.3)
Chloride: 106 mmol/L (ref 98–111)
Creatinine, Ser: 0.96 mg/dL (ref 0.44–1.00)
GFR, Estimated: >60 mL/min (ref >60)
Glucose, Bld: 93 mg/dL (ref 70–99)
Potassium: 3.2 mmol/L — ABNORMAL LOW (ref 3.5–5.1)
Sodium: 141 mmol/L (ref 135–145)
Total Bilirubin: 0.6 mg/dL (ref 0.3–1.2)
Total Protein: 7.8 g/dL (ref 6.5–8.1)

## 2022-07-26 LAB — CBC
HCT: 36.7 % (ref 36.0–46.0)
Hemoglobin: 11.8 g/dL — ABNORMAL LOW (ref 12.0–15.0)
MCH: 27.4 pg (ref 26.0–34.0)
MCHC: 32.2 g/dL (ref 30.0–36.0)
MCV: 85.3 fL (ref 80.0–100.0)
Platelets: 347 10*3/uL (ref 150–400)
RBC: 4.3 MIL/uL (ref 3.87–5.11)
RDW: 14.4 % (ref 11.5–15.5)
WBC: 4.9 10*3/uL (ref 4.0–10.5)
nRBC: 0 % (ref 0.0–0.2)

## 2022-07-26 LAB — URINALYSIS, ROUTINE W REFLEX MICROSCOPIC
Bilirubin Urine: NEGATIVE
Glucose, UA: NEGATIVE mg/dL
Hgb urine dipstick: NEGATIVE
Ketones, ur: NEGATIVE mg/dL
Leukocytes,Ua: NEGATIVE
Nitrite: NEGATIVE
Protein, ur: NEGATIVE mg/dL
Specific Gravity, Urine: 1.02 (ref 1.005–1.030)
pH: 5 (ref 5.0–8.0)

## 2022-07-26 LAB — LIPASE, BLOOD: Lipase: 27 U/L (ref 11–51)

## 2022-07-26 LAB — POC URINE PREG, ED: Preg Test, Ur: NEGATIVE

## 2022-07-26 MED ORDER — POLYETHYLENE GLYCOL 3350 17 G PO PACK: 17.0000 g | PACK | Freq: Every day | ORAL | 0 refills | Status: AC

## 2022-07-26 MED ORDER — OXYCODONE-ACETAMINOPHEN 5-325 MG PO TABS
1.0000 | ORAL_TABLET | Freq: Once | ORAL | Status: AC
  Administered 2022-07-26: 1 via ORAL
  Filled 2022-07-26: qty 1

## 2022-07-26 NOTE — ED Provider Notes (Signed)
[facility] Provider Note    Event Date/Time   First MD Initiated Contact with Patient 07/26/22 2007     (approximate)   History   Abdominal Pain   HPI  Karina Anderson is a 51 y.o. female who presents to the ER for evaluation of periumbilical pain for the past several months since she had gallbladder surgery.  Denies any fevers.  States she has been constipated but is moving her bowels passing gas.  No vomiting.  Felt like she had bulge or bump above her umbilicus and was having some discomfort so came to the ER for further evaluation.     Physical Exam   Triage Vital Signs: ED Triage Vitals  Enc Vitals Group     BP 07/26/22 1911 (!) 150/107     Pulse Rate 07/26/22 1911 90     Resp 07/26/22 1911 18     Temp 07/26/22 1911 98.4 F (36.9 C)     Temp Source 07/26/22 1911 Oral     SpO2 07/26/22 1911 100 %     Weight 07/26/22 1912 185 lb (83.9 kg)     Height 07/26/22 1912 5\' 5"  (1.651 m)     Head Circumference --      Peak Flow --      Pain Score 07/26/22 1912 10     Pain Loc --      Pain Edu? --      Excl. in GC? --     Most recent vital signs: Vitals:   07/26/22 1911  BP: (!) 150/107  Pulse: 90  Resp: 18  Temp: 98.4 F (36.9 C)  SpO2: 100%     Constitutional: Alert  Eyes: Conjunctivae are normal.  Head: Atraumatic. Nose: No congestion/rhinnorhea. Mouth/Throat: Mucous membranes are moist.   Neck: Painless ROM.  Cardiovascular:   Good peripheral circulation. Respiratory: Normal respiratory effort.  No retractions.  Gastrointestinal: Soft a without guarding or rebound.  You can palpate some scar tissue just superior to the umbilicus without fluctuance or overlying erythema.  On exam with deep palpation do suspect palpable small reducible hernia that was unable to be appreciated on repeat exam. Musculoskeletal:  no deformity Neurologic:  MAE spontaneously. No gross focal neurologic deficits are appreciated.  Skin:  Skin is warm, dry and  intact. No rash noted. Psychiatric: Mood and affect are normal. Speech and behavior are normal.    ED Results / Procedures / Treatments   Labs (all labs ordered are listed, but only abnormal results are displayed) Labs Reviewed  COMPREHENSIVE METABOLIC PANEL - Abnormal; Notable for the following components:      Result Value   Potassium 3.2 (*)    All other components within normal limits  CBC - Abnormal; Notable for the following components:   Hemoglobin 11.8 (*)    All other components within normal limits  URINALYSIS, ROUTINE W REFLEX MICROSCOPIC - Abnormal; Notable for the following components:   Color, Urine YELLOW (*)    APPearance HAZY (*)    All other components within normal limits  LIPASE, BLOOD  POC URINE PREG, ED     EKG     RADIOLOGY    PROCEDURES:  Critical Care performed:   Procedures   MEDICATIONS ORDERED IN ED: Medications  oxyCODONE-acetaminophen (PERCOCET/ROXICET) 5-325 MG per tablet 1 tablet (1 tablet Oral Given 07/26/22 2019)     IMPRESSION / MDM / ASSESSMENT AND PLAN / ED COURSE  I reviewed the triage vital signs and the nursing  notes.                              Differential diagnosis includes, but is not limited to, hernia, seroma, scar tissue, SBO, colitis, gastritis, pancreatitis  Patient presented to the ER for evaluation symptoms as described above.  Given duration of symptoms and suspect pain is most likely related to scar tissue from postop.  But on exam do suspect that had small reducible hernia.  Could not appreciate any significant defect on further examination.  She is well-appearing I do not feel that imaging clinically indicated.  Patient tolerating p.o.  Do believe she is appropriate for referral back to surgery.  We discussed symptoms for which she should return to the ER.      FINAL CLINICAL IMPRESSION(S) / ED DIAGNOSES   Final diagnoses:  Periumbilical pain     Rx / DC Orders   ED Discharge Orders           Ordered    polyethylene glycol (MIRALAX / GLYCOLAX) 17 g packet  Daily        07/26/22 2015             Note:  This document was prepared using Dragon voice recognition software and may include unintentional dictation errors.    Willy Eddy, MD 07/26/22 [phone]

## 2022-07-26 NOTE — ED Triage Notes (Signed)
Pt comes from home via POV c/o abd pain and "lump on belly button". Pt had surgery 3 months ago, had gallbladder out, and abd pain/lump has been there since. Pt in NAD, denies CP, SOB.

## 2022-08-01 DIAGNOSIS — F321 Major depressive disorder, single episode, moderate: Secondary | ICD-10-CM | POA: Diagnosis not present

## 2022-08-02 DIAGNOSIS — F321 Major depressive disorder, single episode, moderate: Secondary | ICD-10-CM | POA: Diagnosis not present

## 2022-08-04 NOTE — Progress Notes (Addendum)
This note entered in error under an ED visit.

## 2022-08-04 NOTE — Progress Notes (Addendum)
Discussed CD report from [facility] with [facility] provider. Karina Fellows, FNP, stated to call and advise pt needs blood drawn 6 months after completion of tx. RTC mid April 2024 for f/u, and counsel pt re future blood tests and please refrain from trying to donate plasma.  Phone call to pt at [phone] and pt confirmed identity. Discussed return date for f/u in April 2024, RPR will continue to show reactive, and should not continue to give to plasma center, also plasma center will not provide titer information. Pt expressed no concerns at this time.

## 2022-08-04 NOTE — Addendum Note (Signed)
Encounter addended by: Tracey Harries, RN on: 08/04/2022 11:13 AM  Actions taken: Chief Complaint modified, Clinical Note Signed

## 2022-08-04 NOTE — Addendum Note (Signed)
Encounter addended by: Tracey Harries, RN on: 08/04/2022 11:10 AM  Actions taken: Chief Complaint modified, Clinical Note Signed

## 2022-08-04 NOTE — Progress Notes (Signed)
[facility] received CD report from [facility]: Specimen Date= 07/22/22 Source= Whole Blood Type of Test= STS Result= Positive, Confirmatory Result date= 08/02/22   Report faxed to [facility].   Phone call to [facility]. Spoke with LaShonda Hamptom. States that the 05/22/22 sample that was previously reported to [place] was from plasma. Pt was donating that day. States this 07/22/22 sample was whole blood and pt was there for testing purposes only, not to donate.   (Pertinent Hx= [facility] tested 05/26/22 with result of RPR reactive, Titer 1:2, and TP reactive.  Pt received Bicillin x 3= 05/26/22, 06/02/22, 06/09/22)

## 2022-08-08 DIAGNOSIS — F321 Major depressive disorder, single episode, moderate: Secondary | ICD-10-CM | POA: Diagnosis not present

## 2022-08-09 DIAGNOSIS — F321 Major depressive disorder, single episode, moderate: Secondary | ICD-10-CM | POA: Diagnosis not present

## 2022-08-14 ENCOUNTER — Other Ambulatory Visit: Payer: Self-pay

## 2022-08-14 ENCOUNTER — Emergency Department
Admission: EM | Admit: 2022-08-14 | Discharge: 2022-08-14 | Disposition: A | Discharge status: Home / Self Care | Payer: Medicaid Other | Attending: Emergency Medicine | Admitting: Emergency Medicine

## 2022-08-14 ENCOUNTER — Emergency Department: Payer: Medicaid Other

## 2022-08-14 DIAGNOSIS — K429 Umbilical hernia without obstruction or gangrene: Secondary | ICD-10-CM | POA: Diagnosis not present

## 2022-08-14 DIAGNOSIS — E876 Hypokalemia: Secondary | ICD-10-CM | POA: Diagnosis not present

## 2022-08-14 DIAGNOSIS — Z9049 Acquired absence of other specified parts of digestive tract: Secondary | ICD-10-CM | POA: Diagnosis not present

## 2022-08-14 DIAGNOSIS — K439 Ventral hernia without obstruction or gangrene: Secondary | ICD-10-CM | POA: Diagnosis not present

## 2022-08-14 DIAGNOSIS — D649 Anemia, unspecified: Secondary | ICD-10-CM | POA: Insufficient documentation

## 2022-08-14 DIAGNOSIS — R109 Unspecified abdominal pain: Secondary | ICD-10-CM | POA: Diagnosis present

## 2022-08-14 LAB — URINALYSIS, ROUTINE W REFLEX MICROSCOPIC: Specific Gravity, Urine: 1.025 (ref 1.005–1.030)

## 2022-08-14 LAB — COMPREHENSIVE METABOLIC PANEL
ALT: 8 U/L (ref 0–44)
AST: 15 U/L (ref 15–41)
Albumin: 3.8 g/dL (ref 3.5–5.0)
Alkaline Phosphatase: 69 U/L (ref 38–126)
Anion gap: 8 (ref 5–15)
BUN: 16 mg/dL (ref 6–20)
CO2: 24 mmol/L (ref 22–32)
Calcium: 9.1 mg/dL (ref 8.9–10.3)
Chloride: 109 mmol/L (ref 98–111)
Creatinine, Ser: 0.78 mg/dL (ref 0.44–1.00)
GFR, Estimated: >60 mL/min (ref >60)
Glucose, Bld: 103 mg/dL — ABNORMAL HIGH (ref 70–99)
Potassium: 3.3 mmol/L — ABNORMAL LOW (ref 3.5–5.1)
Sodium: 141 mmol/L (ref 135–145)
Total Bilirubin: 0.4 mg/dL (ref 0.3–1.2)
Total Protein: 7.4 g/dL (ref 6.5–8.1)

## 2022-08-14 LAB — CBC
HCT: 36.2 % (ref 36.0–46.0)
Hemoglobin: 11.6 g/dL — ABNORMAL LOW (ref 12.0–15.0)
MCH: 28.1 pg (ref 26.0–34.0)
MCHC: 32 g/dL (ref 30.0–36.0)
MCV: 87.7 fL (ref 80.0–100.0)
Platelets: 289 10*3/uL (ref 150–400)
RBC: 4.13 MIL/uL (ref 3.87–5.11)
RDW: 14 % (ref 11.5–15.5)
WBC: 5.8 10*3/uL (ref 4.0–10.5)
nRBC: 0 % (ref 0.0–0.2)

## 2022-08-14 LAB — URINALYSIS, MICROSCOPIC (REFLEX)
Bacteria, UA: NONE SEEN
Squamous Epithelial / HPF: NONE SEEN (ref 0–5)
WBC, UA: NONE SEEN WBC/hpf (ref 0–5)

## 2022-08-14 LAB — LIPASE, BLOOD: Lipase: 25 U/L (ref 11–51)

## 2022-08-14 LAB — PREGNANCY, URINE: Preg Test, Ur: NEGATIVE

## 2022-08-14 MED ORDER — ONDANSETRON 4 MG PO TBDP
4.0000 mg | ORAL_TABLET | Freq: Once | ORAL | Status: AC
  Administered 2022-08-14: 4 mg via ORAL
  Filled 2022-08-14: qty 1

## 2022-08-14 MED ORDER — OXYCODONE-ACETAMINOPHEN 5-325 MG PO TABS: 1.0000 | ORAL_TABLET | Freq: Four times a day (QID) | ORAL | 0 refills | Status: DC | PRN

## 2022-08-14 MED ORDER — OXYCODONE-ACETAMINOPHEN 5-325 MG PO TABS
1.0000 | ORAL_TABLET | Freq: Once | ORAL | Status: AC
  Administered 2022-08-14: 1 via ORAL
  Filled 2022-08-14: qty 1

## 2022-08-14 MED ORDER — POTASSIUM CHLORIDE CRYS ER 20 MEQ PO TBCR
40.0000 meq | EXTENDED_RELEASE_TABLET | Freq: Once | ORAL | Status: AC
  Administered 2022-08-14: 40 meq via ORAL
  Filled 2022-08-14: qty 2

## 2022-08-14 MED ORDER — ONDANSETRON 4 MG PO TBDP: 4.0000 mg | ORAL_TABLET | Freq: Four times a day (QID) | ORAL | 0 refills | Status: DC | PRN

## 2022-08-14 NOTE — ED Triage Notes (Signed)
Pt reports hernia after gallbladder removal. Reports increased pain to hernia site. Reports difficulty having complete bm or total void x 4 days. Reports difficulty eating as well d/t full feeling when eating. Pt reports vomiting.

## 2022-08-14 NOTE — ED Notes (Signed)
Patient transported to CT 

## 2022-08-14 NOTE — ED Provider Notes (Signed)
[facility] Provider Note    Event Date/Time   First MD Initiated Contact with Patient 08/14/22 [phone]     (approximate)   History   Abdominal Pain   HPI  Karina Anderson is a 51 y.o. female with history of seizures, hidradenitis, recent cholecystectomy who presents to the emergency department with complaints of pain over ventral abdominal hernia that has been worsening over the past 4 days.  Hernia started after her cholecystectomy.  No overlying skin changes.  No fevers, vomiting, diarrhea, GU symptoms.  States she is currently on her menstrual cycle.  She has had previous BTL.   History provided by patient and daughter.    Past Medical History:  Diagnosis Date   Chronic lower back pain    Hydradenitis    Seizures (HCC)     Past Surgical History:  Procedure Laterality Date   DILATION AND CURETTAGE OF UTERUS     3 miscarriages   HYDRADENITIS EXCISION     R underarm   TUBAL LIGATION     TUMOR REMOVAL  07/2020   index finger on L hand    MEDICATIONS:  Prior to Admission medications   Medication Sig Start Date End Date Taking? Authorizing Provider  clotrimazole (LOTRIMIN) 1 % cream Apply 1 application topically 2 (two) times daily. Patient not taking: Reported on 05/26/2022 04/27/21   Gerre Scull, NP  docusate sodium (COLACE) 100 MG capsule Take 1 tablet once or twice daily as needed for constipation while taking narcotic pain medicine Patient not taking: Reported on 05/26/2022 04/10/22   Loleta Rose, MD  fluticasone [facility]) 50 MCG/ACT nasal spray Place 2 sprays into both nostrils daily. Patient not taking: Reported on 05/26/2022 04/27/21   Gerre Scull, NP  hydrocortisone (ANUSOL-HC) 25 MG suppository Place 1 suppository (25 mg total) rectally 2 (two) times daily. Patient not taking: Reported on 05/26/2022 08/26/21   Olevia Perches P, DO  lidocaine (LIDODERM) 5 % Place 1 patch onto the skin daily. Remove & Discard patch within 12 hours or  as directed by MD.  Failed Tylenol, allergic to NSAIDs. Patient not taking: Reported on 05/26/2022 03/16/22   Gabriel Cirri, NP  omeprazole (PRILOSEC OTC) 20 MG tablet Take 20 mg by mouth daily. Patient not taking: Reported on 05/26/2022 05/28/21   [provider]  ondansetron (ZOFRAN-ODT) 4 MG disintegrating tablet Allow 1-2 tablets to dissolve in your mouth every 8 hours as needed for nausea/vomiting Patient not taking: Reported on 05/26/2022 04/10/22   Loleta Rose, MD  oxyCODONE-acetaminophen (PERCOCET) 5-325 MG tablet Take 1-2 tablets by mouth every 4 (four) hours as needed for severe pain. Patient not taking: Reported on 05/26/2022 04/15/22 04/15/23  Sterling Big F, MD  phenytoin (DILANTIN) 100 MG ER capsule TAKE 1 CAPSULE(100 MG) BY MOUTH THREE TIMES DAILY 04/26/21   Cannady, Corrie Dandy T, NP  polyethylene glycol (MIRALAX / GLYCOLAX) 17 g packet Take 17 g by mouth daily. Mix one tablespoon with 8oz of your favorite juice or water every day until you are having soft formed stools. Then start taking once daily if you didn't have a stool the day before. 07/26/22   Willy Eddy, MD  polyethylene glycol powder (GLYCOLAX/MIRALAX) 17 GM/SCOOP powder Take 17 g by mouth 2 (two) times daily as needed. Patient not taking: Reported on 05/26/2022 09/22/21   Loura Pardon, MD  traMADol (ULTRAM) 50 MG tablet Take 1 tablet (50 mg total) by mouth every 6 (six) hours as needed. Patient not taking: Reported  on 05/26/2022 10/25/21 10/25/22  Jene EveryKinner, Robert, MD    Physical Exam   Triage Vital Signs: ED Triage Vitals  Enc Vitals Group     BP 08/14/22 0127 (!) 145/108     Pulse Rate 08/14/22 0127 82     Resp 08/14/22 0127 18     Temp 08/14/22 0505 98.4 F (36.9 C)     Temp Source 08/14/22 0127 Oral     SpO2 08/14/22 0127 98 %     Weight 08/14/22 0126 185 lb (83.9 kg)     Height 08/14/22 0126 5\' 5"  (1.651 m)     Head Circumference --      Peak Flow --      Pain Score 08/14/22 0126 10     Pain Loc --       Pain Edu? --      Excl. in GC? --     Most recent vital signs: Vitals:   08/14/22 0505 08/14/22 0621  BP: (!) 130/101 (!) 125/91  Pulse: 75 83  Resp: 20 18  Temp: 98.4 F (36.9 C)   SpO2: 99% 100%    CONSTITUTIONAL: Alert and oriented and responds appropriately to questions. Well-appearing; well-nourished HEAD: Normocephalic, atraumatic EYES: Conjunctivae clear, pupils appear equal, sclera nonicteric ENT: normal nose; moist mucous membranes NECK: Supple, normal ROM CARD: RRR; S1 and S2 appreciated; no murmurs, no clicks, no rubs, no gallops RESP: Normal chest excursion without splinting or tachypnea; breath sounds clear and equal bilaterally; no wheezes, no rhonchi, no rales, no hypoxia or respiratory distress, speaking full sentences ABD/GI: Normal bowel sounds; non-distended; soft, small supraumbilical hernia on exam without overlying skin changes.  It is soft and reducible but she is slightly tender over this area. BACK: The back appears normal EXT: Normal ROM in all joints; no deformity noted, no edema; no cyanosis SKIN: Normal color for age and race; warm; no rash on exposed skin NEURO: Moves all extremities equally, normal speech PSYCH: The patient's mood and manner are appropriate.   ED Results / Procedures / Treatments   LABS: (all labs ordered are listed, but only abnormal results are displayed) Labs Reviewed  COMPREHENSIVE METABOLIC PANEL - Abnormal; Notable for the following components:      Result Value   Potassium 3.3 (*)    Glucose, Bld 103 (*)    All other components within normal limits  CBC - Abnormal; Notable for the following components:   Hemoglobin 11.6 (*)    All other components within normal limits  URINALYSIS, ROUTINE W REFLEX MICROSCOPIC - Abnormal; Notable for the following components:   Color, Urine RED (*)    APPearance BLOODY (*)    Glucose, UA   (*)    Value: TEST NOT REPORTED DUE TO COLOR INTERFERENCE OF URINE PIGMENT   Hgb urine  dipstick   (*)    Value: TEST NOT REPORTED DUE TO COLOR INTERFERENCE OF URINE PIGMENT   Bilirubin Urine   (*)    Value: TEST NOT REPORTED DUE TO COLOR INTERFERENCE OF URINE PIGMENT   Ketones, ur   (*)    Value: TEST NOT REPORTED DUE TO COLOR INTERFERENCE OF URINE PIGMENT   Protein, ur   (*)    Value: TEST NOT REPORTED DUE TO COLOR INTERFERENCE OF URINE PIGMENT   Nitrite   (*)    Value: TEST NOT REPORTED DUE TO COLOR INTERFERENCE OF URINE PIGMENT   Leukocytes,Ua   (*)    Value: TEST NOT REPORTED DUE TO COLOR INTERFERENCE  OF URINE PIGMENT   All other components within normal limits  LIPASE, BLOOD  URINALYSIS, MICROSCOPIC (REFLEX)  PREGNANCY, URINE     EKG:     RADIOLOGY: My personal review and interpretation of imaging: Patient has a small ventral hernia without signs of bowel obstruction.  I have personally reviewed all radiology reports.   CT Renal Stone Study  Result Date: 08/14/2022 CLINICAL DATA:  51 year old female with history of abdominal and flank pain. Increasing pain at hernia site. EXAM: CT ABDOMEN AND PELVIS WITHOUT CONTRAST TECHNIQUE: Multidetector CT imaging of the abdomen and pelvis was performed following the standard protocol without IV contrast. RADIATION DOSE REDUCTION: This exam was performed according to the departmental dose-optimization program which includes automated exposure control, adjustment of the mA and/or kV according to patient size and/or use of iterative reconstruction technique. COMPARISON:  CT of the abdomen and pelvis 04/15/2022. FINDINGS: Lower chest: Unremarkable. Hepatobiliary: No suspicious cystic or solid hepatic lesions confidently identified on today's noncontrast CT examination. Status post cholecystectomy. Pancreas: No definite pancreatic mass or peripancreatic fluid collections or inflammatory changes are noted on today's noncontrast CT examination. Spleen: Unremarkable. Adrenals/Urinary Tract: There are no abnormal calcifications within  the collecting system of either kidney, along the course of either ureter, or within the lumen of the urinary bladder. No hydroureteronephrosis or perinephric stranding to suggest urinary tract obstruction at this time. The unenhanced appearance of the kidneys is unremarkable bilaterally. Unenhanced appearance of the urinary bladder is normal. Bilateral adrenal glands are normal in appearance. Stomach/Bowel: The unenhanced appearance of the stomach is normal. There is no pathologic dilatation of small bowel or colon. Normal appendix. Vascular/Lymphatic: No atherosclerotic calcifications are noted in the abdominal aorta or pelvic vasculature. No lymphadenopathy noted in the abdomen or pelvis. Reproductive: Surgical clips in the right hemipelvis, one of which is associated with the right adnexa, and the other one appears in a different position than previously noted (likely dislodged from the left fallopian tube). Uterus and ovaries are otherwise unremarkable in appearance. Other: Small supraumbilical ventral hernia containing only omental fat. No significant volume of ascites. No pneumoperitoneum. Musculoskeletal: There are no aggressive appearing lytic or blastic lesions noted in the visualized portions of the skeleton. IMPRESSION: 1. Small supraumbilical ventral hernia containing only omental fat. No associated bowel incarceration or obstruction at this time. 2. Two surgical clips are noted in the right hemipelvis, one of which appears likely a tubal ligation clip, while the other clip appears mobile and is in a different position than the prior CT examination, likely chronically dislodged from the left fallopian tube. Electronically Signed   By: Trudie Reed M.D.   On: 08/14/2022 05:58     PROCEDURES:  Critical Care performed: No      Procedures    IMPRESSION / MDM / ASSESSMENT AND PLAN / ED COURSE  I reviewed the triage vital signs and the nursing notes.    Patient here with pain over her  ventral hernia.  No symptoms of obstruction.     DIFFERENTIAL DIAGNOSIS (includes but not limited to):   Hernia, less likely obstruction, colitis, appendicitis, bowel perforation   Patient's presentation is most consistent with acute presentation with potential threat to life or bodily function.   PLAN: Workup initiated from triage.  Patient has no leukocytosis, stable mild anemia.  Potassium of 3.3.  Given oral replacement.  Normal LFTs and lipase.  Normal creatinine.  Urine shows some blood likely from her menstrual cycle but no other sign of infection.  Pregnancy test negative.  CT scan reviewed and interpreted by myself and the radiologist and shows a small supraumbilical ventral hernia containing only fat.  There is no bowel obstruction.  No overlying cellulitis, gangrene on exam.  Patient requesting Percocet here.  Will give dose here as she states she has someone to drive her home and will discharge with prescription of pain medicine.  Will give outpatient surgery follow-up with the previous surgeon who did her cholecystectomy for further evaluation, treatment and pain control.   MEDICATIONS GIVEN IN ED: Medications  oxyCODONE-acetaminophen (PERCOCET/ROXICET) 5-325 MG per tablet 1 tablet (1 tablet Oral Given 08/14/22 0621)  ondansetron (ZOFRAN-ODT) disintegrating tablet 4 mg (4 mg Oral Given 08/14/22 0621)  potassium chloride SA (KLOR-CON M) CR tablet 40 mEq (40 mEq Oral Given 08/14/22 2979)     ED COURSE:  At this time, I do not feel there is any life-threatening condition present. I reviewed all nursing notes, vitals, pertinent previous records.  All lab and urine results, EKGs, imaging ordered have been independently reviewed and interpreted by myself.  I reviewed all available radiology reports from any imaging ordered this visit.  Based on my assessment, I feel the patient is safe to be discharged home without further emergent workup and can continue workup as an outpatient as  needed. Discussed all findings, treatment plan as well as usual and customary return precautions.  They verbalize understanding and are comfortable with this plan.  Outpatient follow-up has been provided as needed.  All questions have been answered.    CONSULTS:  none   OUTSIDE RECORDS REVIEWED: Reviewed patient's cholecystectomy for symptomatic cholelithiasis on 04/15/2022 with Dr. Cecelia Byars.       FINAL CLINICAL IMPRESSION(S) / ED DIAGNOSES   Final diagnoses:  Ventral hernia without obstruction or gangrene     Rx / DC Orders   ED Discharge Orders          Ordered    oxyCODONE-acetaminophen (PERCOCET) 5-325 MG tablet  Every 6 hours PRN        08/14/22 0620    ondansetron (ZOFRAN-ODT) 4 MG disintegrating tablet  Every 6 hours PRN        08/14/22 0620             Note:  This document was prepared using Dragon voice recognition software and may include unintentional dictation errors.   Chardai Gangemi, Layla Maw, DO 08/14/22 [phone]

## 2022-08-14 NOTE — Discharge Instructions (Addendum)

## 2022-08-14 NOTE — ED Notes (Signed)
Pt able to tolerate PO intake, the pt denies any nausea or vomiting. She has been given her AVS and follow-up instructions and is agreeable with the plan at this time.

## 2022-08-15 ENCOUNTER — Telehealth: Payer: Self-pay | Admitting: Licensed Clinical Social Worker

## 2022-08-15 NOTE — Patient Outreach (Signed)
Transition Care Management Unsuccessful Follow-up Telephone Call  Date of discharge and from where:  08/14/22 from [facility]  Attempts:  1st Attempt  Reason for unsuccessful TCM follow-up call:  Left voice message  Dickie La, BSW, MSW, [employer] Managed Medicaid LCSW [facility]  [facility] [email or web address] Phone: [phone]

## 2022-08-16 ENCOUNTER — Telehealth: Payer: Self-pay | Admitting: Licensed Clinical Social Worker

## 2022-08-16 ENCOUNTER — Other Ambulatory Visit: Payer: Medicaid Other | Admitting: Pharmacist

## 2022-08-16 ENCOUNTER — Other Ambulatory Visit: Payer: Self-pay | Admitting: Pharmacist

## 2022-08-16 DIAGNOSIS — Z79899 Other long term (current) drug therapy: Secondary | ICD-10-CM

## 2022-08-16 DIAGNOSIS — G894 Chronic pain syndrome: Secondary | ICD-10-CM

## 2022-08-16 DIAGNOSIS — F321 Major depressive disorder, single episode, moderate: Secondary | ICD-10-CM | POA: Diagnosis not present

## 2022-08-16 NOTE — Patient Outreach (Signed)
BSW received a message that patient was informed she no longer had Medicaid. BSW contacted [facility] Medicaid member services and spoke with Melissa D, she stated patients Medicaid is active however it does not cover out anything out of network.   Gus Puma, BSW, [place] [facility]  [place]  High Risk Managed Medicaid Team  [phone]

## 2022-08-16 NOTE — Progress Notes (Signed)
Care Coordination Call  Received message from LCSW that patient was unable to get oxycodone/APAP and ondansetron at the pharmacy yesterday. Per Rx Benefits, patient has an active Medicaid UHC plan and Public librarian.   Designer, fashion/clothing. They did not have the billing information for patient's [employer] plan, provided that. They noted that the plan is not contracted with [facility], [facility].   Contacted pharmacist in [facility] ED to investigate the option for someone to send a new prescription for [facility]codone/[facility]aminophen [facility] [place] for patient. This was discussed with the charge nurse and as the provider that saw the patient on 12/17 is off this week, there are no options for another provider who has not evaluated the patient to prescribe a controlled substance. They suggested either utilizing GoodRx or for the patient to present back to the ED for another provider to see her.   [facility] [place]. Provided billing information. They will transfer ondansetron.    SDOH Interventions Today    Flowsheet Row Most Recent Value  SDOH Interventions   Financial Strain Interventions Other (Comment)  [medication cost concerns]      Contacted patient, left voicemail for her to return my call. Noted that nausea [facility]. Will also consider establishing with PCP.   Catie Eppie Gibson, PharmD, [facility] [phone]

## 2022-08-16 NOTE — Patient Outreach (Signed)
Transition Care Management Unsuccessful Follow-up Telephone Call  Date of discharge and from where:  08/14/22 from [facility]  Attempts:  2nd Attempt  Reason for unsuccessful TCM follow-up call:  Left voice message  Dickie La, BSW, MSW, [employer] Managed Medicaid LCSW [facility]  [facility] [email or web address] Phone: [phone]

## 2022-08-16 NOTE — Patient Outreach (Signed)
Transition Care Management Follow-up Telephone Call Date of discharge and from where: 08/14/22 from [facility] How have you been since you were released from the hospital? In a lot of pain Any questions or concerns? Yes  Items Reviewed: Did the pt receive and understand the discharge instructions provided? Yes  Medications obtained and verified?  No patient reports that pharmacy informed her that her Medicaid is no longer active. Other? No  Any new allergies since your discharge? No  Dietary orders reviewed? No Do you have support at home? No   Home Care and Equipment/Supplies: Were home health services ordered? no If so, what is the name of the agency? na  Has the agency set up a time to come to the patient's home? not applicable Were any new equipment or medical supplies ordered?  No What is the name of the medical supply agency? na Were you able to get the supplies/equipment? not applicable Do you have any questions related to the use of the equipment or supplies? na  Functional Questionnaire: (I = Independent and D = Dependent) ADLs: I  Bathing/Dressing- I  Meal Prep- I  Eating- I  Maintaining continence- I  Transferring/Ambulation- I  Managing Meds- I  Follow up appointments reviewed:  PCP Hospital f/u appt confirmed? No  ED Visit only Specialist Hospital f/u appt confirmed? No Are transportation arrangements needed? Yes  If their condition worsens, is the pt aware to call PCP or go to the Emergency Dept.? Yes Was the patient provided with contact information for the PCP's office or ED? Yes Was to pt encouraged to call back with questions or concerns? Yes  SDOH assessments and interventions completed:   Yes  [facility] LCSW sent in basket message to [facility] team regarding referral as she will not be eligible for program if her Medicaid is inactive but she is in need of pharmacy assistance. Local community resource education provided.  Dickie La, BSW, MSW, [employer] Managed  Medicaid LCSW [facility]  [facility] [email or web address] Phone: [phone]

## 2022-08-16 NOTE — Patient Outreach (Addendum)
Care Coordination  08/16/2022  Karina Anderson Jun 25, 1971 [id number]  [facility] LCSW collaborated with Managed Medicaid team and [facility] BSW Gus Puma was able to [employer] and confirm that patient's Medicaid IS active. Patient has been provided all community resource education. [facility] LCSW made additional outreach to sign patient up for services but was unable to reach her but was informed that [facility] Pharmacist has already contacted patient for assistance. Messages sent to [facility] Pharmacist as well as referral completed for medication assistance.   Dickie La, BSW, MSW, [employer] Managed Medicaid LCSW [facility]  [facility] [email or web address] Phone: [phone]

## 2022-08-17 NOTE — Progress Notes (Signed)
Error, duplicate

## 2022-08-18 DIAGNOSIS — F321 Major depressive disorder, single episode, moderate: Secondary | ICD-10-CM | POA: Diagnosis not present

## 2022-08-24 ENCOUNTER — Other Ambulatory Visit: Payer: Self-pay | Admitting: Pharmacist

## 2022-08-24 DIAGNOSIS — F321 Major depressive disorder, single episode, moderate: Secondary | ICD-10-CM | POA: Diagnosis not present

## 2022-08-24 NOTE — Progress Notes (Signed)
Care Coordination Call  Received call from patient. She notes that she is on the way to [facility] to pay cash price + GoodRx card for oxycodone/acetaminophen and then [facility] ondansetron on insurance. She requests SW support related to financial concerns, specifically power.   Scheduled in next available new patient slot for BSW.   Catie Eppie Gibson, PharmD, [facility] [phone]

## 2022-08-25 DIAGNOSIS — F321 Major depressive disorder, single episode, moderate: Secondary | ICD-10-CM | POA: Diagnosis not present

## 2022-08-27 ENCOUNTER — Encounter: Payer: Self-pay | Admitting: Emergency Medicine

## 2022-08-27 ENCOUNTER — Emergency Department: Payer: Medicaid Other

## 2022-08-27 ENCOUNTER — Other Ambulatory Visit: Payer: Self-pay

## 2022-08-27 ENCOUNTER — Emergency Department
Admission: EM | Admit: 2022-08-27 | Discharge: 2022-08-27 | Disposition: A | Discharge status: Home / Self Care | Payer: Medicaid Other | Attending: Emergency Medicine | Admitting: Emergency Medicine

## 2022-08-27 DIAGNOSIS — R059 Cough, unspecified: Secondary | ICD-10-CM | POA: Diagnosis not present

## 2022-08-27 DIAGNOSIS — Z20822 Contact with and (suspected) exposure to covid-19: Secondary | ICD-10-CM | POA: Diagnosis not present

## 2022-08-27 DIAGNOSIS — K439 Ventral hernia without obstruction or gangrene: Secondary | ICD-10-CM

## 2022-08-27 DIAGNOSIS — R1084 Generalized abdominal pain: Secondary | ICD-10-CM | POA: Diagnosis not present

## 2022-08-27 DIAGNOSIS — K429 Umbilical hernia without obstruction or gangrene: Secondary | ICD-10-CM | POA: Insufficient documentation

## 2022-08-27 DIAGNOSIS — R569 Unspecified convulsions: Secondary | ICD-10-CM | POA: Diagnosis not present

## 2022-08-27 DIAGNOSIS — K469 Unspecified abdominal hernia without obstruction or gangrene: Secondary | ICD-10-CM | POA: Diagnosis not present

## 2022-08-27 LAB — CBC WITH DIFFERENTIAL/PLATELET
Abs Immature Granulocytes: 0.01 10*3/uL (ref 0.00–0.07)
Basophils Absolute: 0 10*3/uL (ref 0.0–0.1)
Basophils Relative: 1 %
Eosinophils Absolute: 0.1 10*3/uL (ref 0.0–0.5)
Eosinophils Relative: 3 %
HCT: 37.4 % (ref 36.0–46.0)
Hemoglobin: 12 g/dL (ref 12.0–15.0)
Immature Granulocytes: 0 %
Lymphocytes Relative: 30 %
Lymphs Abs: 1 10*3/uL (ref 0.7–4.0)
MCH: 27.8 pg (ref 26.0–34.0)
MCHC: 32.1 g/dL (ref 30.0–36.0)
MCV: 86.8 fL (ref 80.0–100.0)
Monocytes Absolute: 0.3 10*3/uL (ref 0.1–1.0)
Monocytes Relative: 9 %
Neutro Abs: 2 10*3/uL (ref 1.7–7.7)
Neutrophils Relative %: 57 %
Platelets: 301 10*3/uL (ref 150–400)
RBC: 4.31 MIL/uL (ref 3.87–5.11)
RDW: 14.1 % (ref 11.5–15.5)
WBC: 3.5 10*3/uL — ABNORMAL LOW (ref 4.0–10.5)
nRBC: 0 % (ref 0.0–0.2)

## 2022-08-27 LAB — URINALYSIS, ROUTINE W REFLEX MICROSCOPIC
Bilirubin Urine: NEGATIVE
Glucose, UA: NEGATIVE mg/dL
Hgb urine dipstick: NEGATIVE
Ketones, ur: NEGATIVE mg/dL
Leukocytes,Ua: NEGATIVE
Nitrite: NEGATIVE
Protein, ur: 30 mg/dL — AB
Specific Gravity, Urine: 1.02 (ref 1.005–1.030)
pH: 5 (ref 5.0–8.0)

## 2022-08-27 LAB — COMPREHENSIVE METABOLIC PANEL
ALT: 9 U/L (ref 0–44)
AST: 15 U/L (ref 15–41)
Albumin: 4.1 g/dL (ref 3.5–5.0)
Alkaline Phosphatase: 71 U/L (ref 38–126)
Anion gap: 8 (ref 5–15)
BUN: 17 mg/dL (ref 6–20)
CO2: 29 mmol/L (ref 22–32)
Calcium: 9.6 mg/dL (ref 8.9–10.3)
Chloride: 103 mmol/L (ref 98–111)
Creatinine, Ser: 0.77 mg/dL (ref 0.44–1.00)
GFR, Estimated: >60 mL/min (ref >60)
Glucose, Bld: 109 mg/dL — ABNORMAL HIGH (ref 70–99)
Potassium: 3.1 mmol/L — ABNORMAL LOW (ref 3.5–5.1)
Sodium: 140 mmol/L (ref 135–145)
Total Bilirubin: 1.1 mg/dL (ref 0.3–1.2)
Total Protein: 7.8 g/dL (ref 6.5–8.1)

## 2022-08-27 LAB — LIPASE, BLOOD: Lipase: 25 U/L (ref 11–51)

## 2022-08-27 LAB — RESP PANEL BY RT-PCR (RSV, FLU A&B, COVID)  RVPGX2
Influenza A by PCR: NEGATIVE
Influenza B by PCR: NEGATIVE
Resp Syncytial Virus by PCR: NEGATIVE
SARS Coronavirus 2 by RT PCR: NEGATIVE

## 2022-08-27 MED ORDER — OXYCODONE-ACETAMINOPHEN 5-325 MG PO TABS: 1.0000 | ORAL_TABLET | Freq: Four times a day (QID) | ORAL | 0 refills | Status: DC | PRN

## 2022-08-27 MED ORDER — OXYCODONE-ACETAMINOPHEN 5-325 MG PO TABS
1.0000 | ORAL_TABLET | Freq: Once | ORAL | Status: AC
  Administered 2022-08-27: 1 via ORAL
  Filled 2022-08-27: qty 1

## 2022-08-27 NOTE — ED Triage Notes (Addendum)
Pt here with a cough. Pt states she had gallbladder surgery in August. Pt c/o pain in her abd due to a hernia between her scar tissue. Pt eating a large meal in triage. Pt states she is coughing a lot but denies coughing up mucus. Pt also having right flank pain.

## 2022-08-27 NOTE — ED Provider Triage Note (Signed)
Emergency Medicine Provider Triage Evaluation Note  Karina Anderson , a 51 y.o. female  was evaluated in triage.  Pt complains of cough x4 days. Reports that she has a known hernia in her epigastrium and her hernia has been hurting more with coughing. No skin changes. No fever. No SOB. No leg swelling. No pleurisy/CP/SOB/leg swelling  Review of Systems  Positive: cough Negative: Hemoptysis, pleurisy, leg swelling  Physical Exam  There were no vitals taken for this visit. Gen:   Awake, no distress   Resp:  Normal effort  MSK:   Moves extremities without difficulty  Other:    Medical Decision Making  Medically screening exam initiated at 1:16 PM.  Appropriate orders placed.  Karina Anderson was informed that the remainder of the evaluation will be completed by another provider, this initial triage assessment does not replace that evaluation, and the importance of remaining in the ED until their evaluation is complete.     Karina Hoehn, PA-C 08/27/22 1317

## 2022-08-27 NOTE — ED Provider Notes (Signed)
[facility] Provider Note    Event Date/Time   First MD Initiated Contact with Patient 08/27/22 1440     (approximate)   History   Chief Complaint: Hernia   HPI  Karina Anderson is a 51 y.o. female with a history of seizures, hidradenitis, chronic back pain who comes ED complaining of generalized abdominal pain, worse in the periumbilical area has been ongoing for the past few weeks.  Also complains of right flank pain.  She relates this to hernia and scar tissue after laparoscopic cholecystectomy in August.  Denies vomiting diarrhea or fever.  Normal bowel movements.  Eating normally.  Requests Percocet.     Physical Exam   Triage Vital Signs: ED Triage Vitals [08/27/22 1316]  Enc Vitals Group     BP 134/72     Pulse Rate (!) 118     Resp 18     Temp 98 F (36.7 C)     Temp Source Oral     SpO2 98 %     Weight 184 lb 15.5 oz (83.9 kg)     Height 5\' 5"  (1.651 m)     Head Circumference      Peak Flow      Pain Score 10     Pain Loc      Pain Edu?      Excl. in [place]?     Most recent vital signs: Vitals:   08/27/22 1316  BP: 134/72  Pulse: (!) 118  Resp: 18  Temp: 98 F (36.7 C)  SpO2: 98%    General: Awake, no distress.  CV:  Good peripheral perfusion.  Resp:  Normal effort.  Abd:  No distention.  1 cm supraumbilical hernia present without entrapped tissue.  No focal tenderness there.  Abdomen is soft, no focal tenderness.  Patient does represent some generalized tenderness. Other:  No edema.  Moist oral mucosa   ED Results / Procedures / Treatments   Labs (all labs ordered are listed, but only abnormal results are displayed) Labs Reviewed  COMPREHENSIVE METABOLIC PANEL - Abnormal; Notable for the following components:      Result Value   Potassium 3.1 (*)    Glucose, Bld 109 (*)    All other components within normal limits  CBC WITH DIFFERENTIAL/PLATELET - Abnormal; Notable for the following components:   WBC 3.5 (*)    All  other components within normal limits  URINALYSIS, ROUTINE W REFLEX MICROSCOPIC - Abnormal; Notable for the following components:   Color, Urine YELLOW (*)    APPearance HAZY (*)    Protein, ur 30 (*)    Bacteria, UA RARE (*)    All other components within normal limits  RESP PANEL BY RT-PCR (RSV, FLU A&B, COVID)  RVPGX2  LIPASE, BLOOD     EKG    RADIOLOGY Chest x-ray interpreted by me, appears normal.  Radiology report reviewed.  CT abdomen pelvis pending   PROCEDURES:  Procedures   MEDICATIONS ORDERED IN ED: Medications  oxyCODONE-acetaminophen (PERCOCET/ROXICET) 5-325 MG per tablet 1 tablet (has no administration in time range)     IMPRESSION / MDM / ASSESSMENT AND PLAN / ED COURSE  I reviewed the triage vital signs and the nursing notes.                              Differential diagnosis includes, but is not limited to, kidney stone, internal hernia, viral illness,  dehydration, UTI, AKI, electrolyte abnormality, chronic pain syndrome  Patient's presentation is most consistent with acute presentation with potential threat to life or bodily function.  Patient presents with abdominal pain which is generalized but most pronounced in the supraumbilical area which she relates to her prior laparoscopic surgery and small hernia present in this area without any entrapment of tissue.  She is nontoxic, lab workup is all unremarkable.  Will give Percocet, obtain CT renal stone study.  Recommend follow-up with general surgery for further assessment if pain continues.       FINAL CLINICAL IMPRESSION(S) / ED DIAGNOSES   Final diagnoses:  Generalized abdominal pain  Supraumbilical hernia without gangrene and without obstruction     Rx / DC Orders   ED Discharge Orders     None        Note:  This document was prepared using Dragon voice recognition software and may include unintentional dictation errors.   Sharman Cheek, MD 08/27/22 [id number]

## 2022-08-27 NOTE — Discharge Instructions (Addendum)
Your CT scan today did not show any acute abnormalities or complications of the hernia.  Please follow-up with Dr. Everlene Farrier for further assessment.

## 2022-08-27 NOTE — ED Notes (Signed)
See triage note. Pt brought to room 40H and given pillow and blanket. Family member asking for apple juice. Labs etc have all been drawn. Pt was noted in triage to have cough and pain to hernia area. Recent gallbladder surgery. Pt appears comfortable.

## 2022-08-29 DIAGNOSIS — L539 Erythematous condition, unspecified: Secondary | ICD-10-CM | POA: Diagnosis not present

## 2022-08-29 DIAGNOSIS — R1110 Vomiting, unspecified: Secondary | ICD-10-CM | POA: Diagnosis not present

## 2022-08-29 DIAGNOSIS — K429 Umbilical hernia without obstruction or gangrene: Secondary | ICD-10-CM | POA: Diagnosis not present

## 2022-08-30 ENCOUNTER — Telehealth: Payer: Self-pay | Admitting: *Deleted

## 2022-08-30 ENCOUNTER — Ambulatory Visit: Payer: Self-pay

## 2022-08-30 ENCOUNTER — Other Ambulatory Visit: Payer: Medicaid Other

## 2022-08-30 NOTE — Patient Outreach (Signed)
Medicaid Managed Care Social Work Note  08/30/2022 Name:  Karina Anderson MRN:  [id number] DOB:  1970-10-23  Karina Anderson is an 52 y.o. year old female who is a primary patient of Pcp, No.  The Medicaid Managed Care Coordination team was consulted for assistance with:  [employer]   [name] was given information about [employer] team services today. [place] Patient agreed to services and verbal consent obtained.  Engaged with patient  for by telephone forfollow up visit in response to referral for case management and/or care coordination services.   Assessments/Interventions:  Review of past medical history, allergies, medications, health status, including review of consultants reports, laboratory and other test data, was performed as part of comprehensive evaluation and provision of chronic care management services.  SDOH: (Social Determinant of Health) assessments and interventions performed: SDOH Interventions    Flowsheet Row Patient Outreach from 08/16/2022 in [place] Most recent reading at 08/16/2022  4:26 PM Telephone from 08/16/2022 in [place] Most recent reading at 08/16/2022 11:31 AM Patient Outreach Telephone from 09/08/2021 in [place] Most recent reading at 09/08/2021 10:55 AM Patient Outreach Telephone from 06/24/2021 in Karina Anderson Most recent reading at 06/24/2021  1:12 PM  SDOH Interventions      Food Insecurity Interventions -- -- -- Intervention Not Indicated  Housing Interventions -- -- Other (Comment)  [Telephone call to Karina Anderson regarding increased financial strain/unable to pay rent for apartment this month] Other (Comment)  [Referral to [place] BSW]  Transportation Interventions -- -- -- Intervention Not Indicated  Financial Strain Interventions Other (Comment)  [medication cost  concerns] -- Other (Comment)  [Telephone call to Karina Anderson regarding patient's increased financial strain over inability to pay rent this month.] Other (Comment)  [Referral to [facility] BSW]  Physical Activity Interventions -- -- -- Intervention Not Indicated  Stress Interventions -- [employer], Provide Counseling -- --  Social Connections Interventions -- -- -- Intervention Not Indicated     BSW completed a telephone outreach with patient regarding her utility bill. Patient states her lights are off and have been since before Christmas and she has contacted every resources in [facility] and contacted the [employer], they paid 2000 towards the 3600 bill. Patient states she has asked family members and they are not willing to help. Her daughters teacher made a payment of 288. Patient states she now owes about 1800. Patients daughters disability started back and she received 65 in back pay, patient stated she paid back rent and paid back what she owed to friends and family members. BSW contacted [place] [facility] services to see if they can possible assist patient, BSW left a voicemail.  Advanced Directives Status:  Not addressed in this encounter.  Care Plan                 Allergies  Allergen Reactions   Citrus Hives   Sulfamethoxazole-Trimethoprim Hives   Butorphanol Hives   Butorphanol Tartrate Hives   Contrast Media [Iodinated Contrast Media] Nausea And Vomiting   Food Other (See Comments)    Allergic to Citrus Fruits.   Ibuprofen    Ketorolac    Lorazepam Hives   Naproxen    Prochlorperazine Hives   Nsaids Rash    Medications Reviewed Today     Reviewed by Arlyce Harman, RN (Registered Nurse) on 08/27/22 at [id number]  Med List Status: <None>   Medication Order Taking? Sig Documenting Provider Last Dose Status Informant  ondansetron (ZOFRAN-ODT) 4 MG disintegrating tablet [id number]  Take 1 tablet (4 mg total) by mouth every 6  (six) hours as needed for nausea or vomiting. Ward, Delice Bison, DO  Active   oxyCODONE-acetaminophen (PERCOCET) 5-325 MG tablet [id number]  Take 1 tablet by mouth every 6 (six) hours as needed for severe pain. Ward, Delice Bison, DO  Active   phenytoin (DILANTIN) 100 MG ER capsule [id number]  TAKE 1 CAPSULE(100 MG) BY MOUTH THREE TIMES DAILY Cannady, Jolene T, NP  Active   polyethylene glycol (MIRALAX / GLYCOLAX) 17 g packet [id number]  Take 17 g by mouth daily. Mix one tablespoon with 8oz of your favorite juice or water every day until you are having soft formed stools. Then start taking once daily if you didn't have a stool the day before. Merlyn Lot, MD  Active             Patient Active Problem List   Diagnosis Date Noted   Abnormal drug screen [facility]) 05/03/2022   Chronic back pain 01/75/1025   Folliculitis 85/27/7824   Chronic pain syndrome 04/07/2021   Pharmacologic therapy 04/07/2021   Disorder of skeletal system 04/07/2021   Problems influencing health status 04/07/2021   Arthritis of left knee 04/01/2021   Diarrhea 03/23/2021   Nausea and vomiting 03/23/2021   Impaired fasting glucose 12/31/2020   History of psychosis 12/01/2020   History of suicide attempt 12/01/2020   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   Bone lesion 02/28/2020   Tobacco use disorder 03/13/2016   Cannabis use disorder, moderate, dependence ([place]) 03/13/2016   Bipolar affective disorder, manic, severe, with psychotic behavior ([place]) 03/12/2016   Seizures ([place]) 03/11/2016   Involuntary commitment 03/11/2016   Syphilis, unspecified 08/29/1898    Conditions to be addressed/monitored per PCP order:   community resources  There are no care plans that you recently modified to display for this patient.   Follow up:  Patient agrees to Care Plan and Follow-up.  Plan: The Managed Medicaid care management team will reach out to the patient again over the next 7 days.  Date/time of  next scheduled Social Work care management/care coordination outreach:  09/08/22  Mickel Fuchs, Arita Miss, [place] Medicaid Team  [phone]

## 2022-08-30 NOTE — Patient Instructions (Signed)
Visit Information  Karina Anderson was given information about Medicaid Managed Care team care coordination services as a part of their [facility] Medicaid benefit. Karina Anderson verbally consented to engagement with the [facility] Managed Care team.   If you are experiencing a medical emergency, please call 911 or report to your local emergency department or urgent care.   If you have a non-emergency medical problem during routine business hours, please contact your provider's office and ask to speak with a nurse.   For questions related to your [facility] health plan, please call: [phone] or go here:[email or web address]  If you would like to schedule transportation through your [facility] plan, please call the following number at least 2 days in advance of your appointment: [phone].  You can also use the MTM portal or MTM mobile app to manage your rides. For the portal, please go to [email or web address].  Call the [place] at [phone], at any time, 24 hours a day, 7 days a week. If you are in danger or need immediate medical attention call 911.  If you would like help to quit smoking, call 1-800-QUIT-NOW (406) 061-3269) OR Espaol: 1-855-Djelo-Ya (3-976-734-1937) o para ms informacin haga clic aqu or Text READY to 200-400 to register via text  Karina Anderson - following are the goals we discussed in your visit today:   Goals Addressed   None       Social Worker will follow up on 09/08/22.   Mickel Fuchs, BSW, [place] Managed Medicaid Team  [phone]   Following is a copy of your plan of care:  There are no care plans that you recently modified to display for this patient.

## 2022-08-30 NOTE — Telephone Encounter (Signed)
  Chief Complaint: hernia Symptoms: umbilical hernia 70/26, able to retract but then comes back, soft when touching Frequency: several months now Pertinent Negatives: NA Disposition: [] ED /[x] Urgent Care (no appt availability in office) / [] Appointment(In office/virtual)/ []  [place] Virtual Care/ [] Home Care/ [] Refused Recommended Disposition /[] [place] Mobile Bus/ []  Follow-up with PCP Additional Notes: pt had gallstones removed in Aug 2023 and ended up with hernia afterwards. Pt states that it has gotten worse and gets to where she can push it back in and it comes right back out. Pt has been to ED multiple times and says they just give her pain medication and not do anything else. Offered for pt to there but she refused. No appts until 09/01/22 with practice so scheduled UC appt tomorrow at 1030.   Reason for Disposition  [1] Constant abdominal pain AND [2] present > 2 hours  (NO pain or tenderness of hernia)  Answer Assessment - Initial Assessment Questions 1. ONSET:  "When did this first appear?"     Few months 3. SIZE: "How big is it?" (inches, cm or compare to coins, fruit)     Pinky size 4. LOCATION: "Where exactly is the hernia located?"     Above the navel  5. PATTERN: "Does the swelling come and go, or has it been constant since it started?"     Comes and goes  6. PAIN: "Is there any pain?" If Yes, ask: "How bad is it?"  (Scale 1-10; or mild, moderate, severe)    - MILD (1-3): Doesn't interfere with normal activities, abdomen soft and not tender to touch.     - MODERATE (4-7): Interferes with normal activities or awakens from sleep, abdomen tender to touch.     - SEVERE (8-10): Excruciating pain, doubled over, unable to do any normal activities.       14/10 7. DIAGNOSIS: "Have you been seen by a doctor (or NP/PA) for this?" "Did the doctor diagnose you as having a hernia?"     ED provider 8. OTHER SYMPTOMS: "Do you have any other symptoms?" (e.g., fever, abdomen pain,  vomiting)     Abdomen pain  Protocols used: Hernia-A-AH

## 2022-08-30 NOTE — Patient Outreach (Signed)
Transition Care Management Follow-up Telephone Call Date of discharge and from where: 08/27/22 How have you been since you were released from the hospital? Karina Anderson  Any questions or concerns? Yes  Items Reviewed: Did the pt receive and understand the discharge instructions provided? Yes  Medications obtained and verified? Yes  Other? No  Any new allergies since your discharge? No  Dietary orders reviewed? No Do you have support at home? Yes   Home Care and Equipment/Supplies: Were home health services ordered? not applicable If so, what is the name of the agency?   Has the agency set up a time to come to the patient's home? not applicable Were any new equipment or medical supplies ordered?  No What is the name of the medical supply agency?  Were you able to get the supplies/equipment? not applicable Do you have any questions related to the use of the equipment or supplies? No  Functional Questionnaire: (I = Independent and D = Dependent) ADLs: I  Bathing/Dressing- I  Meal Prep- I  Eating- I  Maintaining continence- I  Transferring/Ambulation- I  Managing Meds- I  Follow up appointments reviewed:  PCP Hospital f/u appt confirmed? No  Scheduled to see  on  @ . [place] [facility] f/u appt confirmed? No  Scheduled to see  on  @ . Are transportation arrangements needed? No  If their condition worsens, is the pt aware to call PCP or go to the Emergency Dept.? Yes Was the patient provided with contact information for the PCP's office or ED? Yes Was to pt encouraged to call back with questions or concerns? Yes

## 2022-08-30 NOTE — Telephone Encounter (Signed)
Pt has been dismissed from practice.

## 2022-08-30 NOTE — Telephone Encounter (Signed)
Left message for patient to schedule an appointment with Dr Dahlia Byes for umbilical hernia

## 2022-08-31 NOTE — Telephone Encounter (Addendum)
Patient called PEC/office- patient states UC called her and can not see her for her current complaint. Patient states she did not follow up with surgical center because gallbladder surgery cause the hernia. I advised patient I understand her reluctance to have another surgery- but if this hernia is causing her discomfort- she may want to at least talk to surgeon about the options and consider them. Call was ended.

## 2022-09-08 ENCOUNTER — Other Ambulatory Visit: Payer: Medicaid Other

## 2022-09-08 NOTE — Patient Instructions (Signed)
  Medicaid Managed Care   Unsuccessful Outreach Note  09/08/2022 Name: Karina Anderson MRN: [id number] DOB: 08-Oct-1970  Referred by: Pcp, No Reason for referral : High Risk Managed Medicaid (MM social work unsuccessful telephone outreach )   An unsuccessful telephone outreach was attempted today. The patient was referred to the case management team for assistance with care management and care coordination.   Follow Up Plan: The patient has been provided with contact information for the care management team and has been advised to call with any health related questions or concerns.   Mickel Fuchs, BSW, [place] Managed Medicaid Team  [phone]

## 2022-09-08 NOTE — Patient Outreach (Signed)
  Medicaid Managed Care   Unsuccessful Outreach Note  09/08/2022 Name: Karina Anderson MRN: [id number] DOB: 07/26/1971  Referred by: Pcp, No Reason for referral : High Risk Managed Medicaid (MM social work unsuccessful telephone outreach )   An unsuccessful telephone outreach was attempted today. The patient was referred to the case management team for assistance with care management and care coordination.   Follow Up Plan: The patient has been provided with contact information for the care management team and has been advised to call with any health related questions or concerns.   Jacie Tristan, BSW, MHA [facility]  [place]  High Risk Managed Medicaid Team  [phone]  

## 2022-09-21 ENCOUNTER — Ambulatory Visit (INDEPENDENT_AMBULATORY_CARE_PROVIDER_SITE_OTHER): Payer: Medicaid Other | Admitting: Surgery

## 2022-09-21 ENCOUNTER — Encounter: Payer: Self-pay | Admitting: Surgery

## 2022-09-21 VITALS — BP 133/91 | HR 108 | Temp 98.3°F | Ht 65.0 in | Wt 156.4 lb

## 2022-09-21 DIAGNOSIS — K439 Ventral hernia without obstruction or gangrene: Secondary | ICD-10-CM

## 2022-09-21 MED ORDER — PREGABALIN 100 MG PO CAPS: 100.0000 mg | ORAL_CAPSULE | Freq: Two times a day (BID) | ORAL | 0 refills | Status: DC

## 2022-09-21 NOTE — Patient Instructions (Addendum)
Our surgery scheduler will call you within 24-48 hours to schedule your surgery. Please have the [facility] surgery sheet available when speaking with her.    Laparoscopic Ventral Hernia Repair Laparoscopic ventral hernia repairis a procedure to fix a bulge of tissue that pushes through a weak area of muscle in the abdomen (ventral hernia). A ventral hernia may be: Above the belly button. This is called an epigastric hernia. At the belly button. This is called an umbilical hernia. At the incision site from previous abdominal surgery. This is called an incisional hernia. You may have this procedure as emergency surgery if part of your intestine gets trapped inside the hernia and starts to lose its blood supply (strangulation). Laparoscopic surgery is done through small incisions using a thin surgical telescope with a light and camera (laparoscope). During surgery, your surgeon will use images from the laparoscope to guide the procedure. A mesh screen will be placed in the hernia to close the opening and strengthen the abdominal wall. Tell a health care provider about: Any allergies you have. All medicines you are taking, including vitamins, herbs, eye drops, creams, and over-the-counter medicines. Any problems you or family members have had with anesthetic medicines. Any blood disorders you have. Any surgeries you have had. Any medical conditions you have. Whether you are pregnant or may be pregnant. What are the risks? Generally, this is a safe procedure. However, problems may occur, including: Infection. Bleeding. Damage to nearby structures or organs in the abdomen. Trouble urinating or having a bowel movement after surgery. Blood clots. The hernia coming back after surgery. Fluid buildup in the area of the hernia. In some cases, your health care provider may need to switch from a laparoscopic procedure to a procedure that is done through a single, larger incision in the abdomen (open  procedure). You may need an open procedure if: You have a hernia that is difficult to repair. Your organs are hard to see with the laparoscope. You have bleeding problems during the laparoscopic procedure. What happens before the procedure? Staying hydrated Follow instructions from your health care provider about hydration, which may include: Up to 2 hours before the procedure - you may continue to drink clear liquids, such as water, clear fruit juice, black coffee, and plain tea.  Eating and drinking restrictions Follow instructions from your health care provider about eating and drinking, which may include: 8 hours before the procedure - stop eating heavy meals or foods, such as meat, fried foods, or fatty foods. 6 hours before the procedure - stop eating light meals or foods, such as toast or cereal. 6 hours before the procedure - stop drinking milk or drinks that contain milk. 2 hours before the procedure - stop drinking clear liquids. Medicines Ask your health care provider about: Changing or stopping your regular medicines. This is especially important if you are taking diabetes medicines or blood thinners. Taking medicines such as aspirin and ibuprofen. These medicines can thin your blood. Do not take these medicines unless your health care provider tells you to take them. Taking over-the-counter medicines, vitamins, herbs, and supplements. Tests You may need to have tests before the procedure, such as: Blood tests. Urine tests. Abdominal ultrasound. Chest X-ray. Electrocardiogram (ECG). General instructions You may be asked to take a laxative or do an enema to empty your bowel before surgery (bowel prep). Do not use any products that contain nicotine or tobacco for at least 4 weeks before the procedure. These products include cigarettes, chewing tobacco, and  vaping devices, such as e-cigarettes. If you need help quitting, ask your health care provider. Ask your health care  provider: How your surgery site will be marked. What steps will be taken to help prevent infection. These steps may include: Removing hair at the surgery site. Washing skin with a germ-killing soap. Receiving antibiotic medicine. Plan to have a responsible adult take you home from the hospital or clinic. If you will be going home right after the procedure, plan to have a responsible adult care for you for the time you are told. This is important. What happens during the procedure?  An IV will be inserted into one of your veins. You will be given one or more of the following: A medicine to help you relax (sedative). A medicine to numb the area (local anesthetic). A medicine to make you fall asleep (general anesthetic). A small incision will be made in your abdomen. A hollow metal tube (trocar) will be placed through the incision. A tube will be placed through the trocar to inflate your abdomen with carbon dioxide. This makes it easier for your surgeon to see inside your abdomen during the repair. A laparoscope will be inserted into your abdomen through the trocar. The laparoscope will send images to a monitor in the operating room. Other trocars will be put through other small incisions in your abdomen. The surgical instruments needed for the procedure will be placed through these trocars. The tissue or intestines that make up the hernia will be moved back into place. The edges of the hernia may be stitched (sutured) together. A piece of mesh will be used to close the hernia. Sutures, clips, or staples will be used to keep the mesh in place. A bandage (dressing) or skin glue will be put over the incisions. The procedure may vary among health care providers and hospitals. What happens after the procedure? Your blood pressure, heart rate, breathing rate, and blood oxygen level will be monitored until you leave the hospital or clinic. You will continue to receive fluids and medicines through an  IV. Your IV will be removed when you can drink clear fluids. You will be given pain medicine as needed. You will be encouraged to get up and walk around as soon as possible. You will be shown how to do deep breathing exercises to help prevent a lung infection. If you were given a sedative during the procedure, it can affect you for several hours. Do not drive or operate machinery until your health care provider says that it is safe. Summary Laparoscopic ventral hernia is an operation to fix a hernia using small incisions. Tell your health care provider about other medical conditions that you have and about all the medicines that you are taking. Follow instructions from your health care provider about eating and drinking before the procedure. Plan to have a responsible adult take you home from the hospital or clinic. After the procedure, you will be encouraged to walk as soon as possible. You will also be taught how to do deep breathing exercises. This information is not intended to replace advice given to you by your health care provider. Make sure you discuss any questions you have with your health care provider. Document Revised: 04/03/2020 Document Reviewed: 04/03/2020 Elsevier Patient Education  [place].

## 2022-09-22 ENCOUNTER — Telehealth: Payer: Self-pay | Admitting: Surgery

## 2022-09-22 NOTE — Telephone Encounter (Signed)
Left message for patient to call, please inform her of the following scheduled surgery with  Dr. Dahlia Byes.    Pre-Admission date/time, and Surgery date at [facility].  Surgery Date: 10/11/22 Preadmission Testing Date: 10/05/22 (phone 8a-1p)  Also patient will need to call at [phone], between 1-3:00pm the day before surgery, to find out what time to arrive for surgery.

## 2022-09-22 NOTE — Progress Notes (Signed)
Outpatient Surgical Follow Up  09/22/2022  Karina Anderson is an 52 y.o. female.   Chief Complaint  Patient presents with   New Patient (Initial Visit)    Abdominal pain    HPI: Karina Anderson is a 52 year old female known to me with a prior history of biliary colic.  I did cholecystectomy last year was uneventful. \She does have history of chronic pain and seizures.  She also is a smoker and does marijuana. She is stated that over the last month or so that she has developed periumbilical pain and experiences daily sharp pain worsening with Valsalva.  She did have a recent CT scan that have personally reviewed showing 3 cm uncomplicated umbilical hernia/incisional.  CBC and CMP was normal. Denies any chest pain or shortness of breath.   Past Medical History:  Diagnosis Date   Chronic lower back pain    Hydradenitis    Seizures ([place])     Past Surgical History:  Procedure Laterality Date   DILATION AND CURETTAGE OF UTERUS     3 miscarriages   HYDRADENITIS EXCISION     R underarm   TUBAL LIGATION     TUMOR REMOVAL  07/2020   index finger on L hand    Family History  Problem Relation Age of Onset   Seizures Mother    Diabetes Mother    Heart attack Father    Hypertension Brother    Eczema Daughter    Anxiety disorder Maternal Grandmother    Hypertension Brother    Hypertension Brother    Hypertension Brother    Hypertension Brother     Social History:  reports that she has been smoking cigarettes. She has a 10.00 pack-year smoking history. She has never used smokeless tobacco. She reports current drug use. Drug: Marijuana. She reports that she does not drink alcohol.  Allergies:  Allergies  Allergen Reactions   Citrus Hives   Sulfamethoxazole-Trimethoprim Hives   Butorphanol Hives   Butorphanol Tartrate Hives   Contrast Media [Iodinated Contrast Media] Nausea And Vomiting   Food Other (See Comments)    Allergic to Citrus Fruits.   Ibuprofen    Ketorolac    Lorazepam  Hives   Naproxen    Prochlorperazine Hives   Nsaids Rash    Medications reviewed.    ROS Full ROS performed and is otherwise negative other than what is stated in HPI   BP (!) 133/91   Pulse (!) 108   Temp 98.3 F (36.8 C) (Oral)   Ht 5' 5"$  (1.651 m)   Wt 156 lb 6.4 oz (70.9 kg)   SpO2 99%   BMI 26.03 kg/m   Physical Exam Vitals and nursing note reviewed. Exam conducted with a chaperone present.  Constitutional:      Appearance: Normal appearance. She is normal weight.  Cardiovascular:     Rate and Rhythm: Normal rate and regular rhythm.     Heart sounds: No murmur heard. Pulmonary:     Effort: Pulmonary effort is normal. No respiratory distress.     Breath sounds: Normal breath sounds. No stridor.  Abdominal:     General: Abdomen is flat. There is no distension.     Palpations: Abdomen is soft. There is no mass.     Tenderness: There is abdominal tenderness. There is no guarding.     Hernia: No hernia is present.     Comments: Local hernia reducible tender to palpation measuring 3 cm  Musculoskeletal:     Cervical back: Normal  range of motion and neck supple. No rigidity or tenderness.  Skin:    Capillary Refill: Capillary refill takes less than 2 seconds.  Neurological:     General: No focal deficit present.     Mental Status: She is alert and oriented to person, place, and time.  Psychiatric:        Mood and Affect: Mood normal.        Behavior: Behavior normal.        Thought Content: Thought content normal.        Judgment: Judgment normal.       Assessment/Plan: 52 year old female with symptomatic ventral hernia.  Discussed with her in detail and definitely recommend repair.  I do have some concerns regarding chronic narcotic abuse.  She specifically asked me for Percocet.  I was very honest with her and stated that they did not feel comfortable prescribing narcotics preoperatively.  I did offer her to see pain management.  One of the alternative was to  give her Lyrica which she was willing to take.  She is allergic to NSAIDs and unfortunately does not an option.  Guarding hernia we discussed different approaches I do think that is small enough that we can do an open repair safely.  Procedure discussed with her in detail.  Risk, benefits and possible implication including but not limited to: Bleeding, infection, recurrence, seromas and chronic pain.  Also was very candid with her that if the pain persist we will have to send her to pain management since I am not a pain doctor.  I spent 40 minutes in this encounter including personally reviewing imaging studies, coordination of her care, counseling, reviewing medical records, placing orders and performing appropriate documentation   Caroleen Hamman, MD [facility] Surgeon

## 2022-09-22 NOTE — H&P (View-Only) (Signed)
Outpatient Surgical Follow Up  09/22/2022  Karina Anderson is an 52 y.o. female.   Chief Complaint  Patient presents with   New Patient (Initial Visit)    Abdominal pain    HPI: Karina Anderson is a 52 year old female known to me with a prior history of biliary colic.  I did cholecystectomy last year was uneventful. \She does have history of chronic pain and seizures.  She also is a smoker and does marijuana. She is stated that over the last month or so that she has developed periumbilical pain and experiences daily sharp pain worsening with Valsalva.  She did have a recent CT scan that have personally reviewed showing 3 cm uncomplicated umbilical hernia/incisional.  CBC and CMP was normal. Denies any chest pain or shortness of breath.   Past Medical History:  Diagnosis Date   Chronic lower back pain    Hydradenitis    Seizures ([place])     Past Surgical History:  Procedure Laterality Date   DILATION AND CURETTAGE OF UTERUS     3 miscarriages   HYDRADENITIS EXCISION     R underarm   TUBAL LIGATION     TUMOR REMOVAL  07/2020   index finger on L hand    Family History  Problem Relation Age of Onset   Seizures Mother    Diabetes Mother    Heart attack Father    Hypertension Brother    Eczema Daughter    Anxiety disorder Maternal Grandmother    Hypertension Brother    Hypertension Brother    Hypertension Brother    Hypertension Brother     Social History:  reports that she has been smoking cigarettes. She has a 10.00 pack-year smoking history. She has never used smokeless tobacco. She reports current drug use. Drug: Marijuana. She reports that she does not drink alcohol.  Allergies:  Allergies  Allergen Reactions   Citrus Hives   Sulfamethoxazole-Trimethoprim Hives   Butorphanol Hives   Butorphanol Tartrate Hives   Contrast Media [Iodinated Contrast Media] Nausea And Vomiting   Food Other (See Comments)    Allergic to Citrus Fruits.   Ibuprofen    Ketorolac    Lorazepam  Hives   Naproxen    Prochlorperazine Hives   Nsaids Rash    Medications reviewed.    ROS Full ROS performed and is otherwise negative other than what is stated in HPI   BP (!) 133/91   Pulse (!) 108   Temp 98.3 F (36.8 C) (Oral)   Ht 5' 5"$  (1.651 m)   Wt 156 lb 6.4 oz (70.9 kg)   SpO2 99%   BMI 26.03 kg/m   Physical Exam Vitals and nursing note reviewed. Exam conducted with a chaperone present.  Constitutional:      Appearance: Normal appearance. She is normal weight.  Cardiovascular:     Rate and Rhythm: Normal rate and regular rhythm.     Heart sounds: No murmur heard. Pulmonary:     Effort: Pulmonary effort is normal. No respiratory distress.     Breath sounds: Normal breath sounds. No stridor.  Abdominal:     General: Abdomen is flat. There is no distension.     Palpations: Abdomen is soft. There is no mass.     Tenderness: There is abdominal tenderness. There is no guarding.     Hernia: No hernia is present.     Comments: Local hernia reducible tender to palpation measuring 3 cm  Musculoskeletal:     Cervical back: Normal  range of motion and neck supple. No rigidity or tenderness.  Skin:    Capillary Refill: Capillary refill takes less than 2 seconds.  Neurological:     General: No focal deficit present.     Mental Status: She is alert and oriented to person, place, and time.  Psychiatric:        Mood and Affect: Mood normal.        Behavior: Behavior normal.        Thought Content: Thought content normal.        Judgment: Judgment normal.       Assessment/Plan: 52 year old female with symptomatic ventral hernia.  Discussed with her in detail and definitely recommend repair.  I do have some concerns regarding chronic narcotic abuse.  She specifically asked me for Percocet.  I was very honest with her and stated that they did not feel comfortable prescribing narcotics preoperatively.  I did offer her to see pain management.  One of the alternative was to  give her Lyrica which she was willing to take.  She is allergic to NSAIDs and unfortunately does not an option.  Guarding hernia we discussed different approaches I do think that is small enough that we can do an open repair safely.  Procedure discussed with her in detail.  Risk, benefits and possible implication including but not limited to: Bleeding, infection, recurrence, seromas and chronic pain.  Also was very candid with her that if the pain persist we will have to send her to pain management since I am not a pain doctor.  I spent 40 minutes in this encounter including personally reviewing imaging studies, coordination of her care, counseling, reviewing medical records, placing orders and performing appropriate documentation   Caroleen Hamman, MD [facility] Surgeon

## 2022-09-23 NOTE — Telephone Encounter (Signed)
Another message is left for patient to call.   

## 2022-09-27 NOTE — Telephone Encounter (Signed)
Patient calls back, she is now informed of all dates regarding her surgery and verbalized understanding.   

## 2022-09-27 NOTE — Telephone Encounter (Signed)
Another message is left for patient to call so that surgery information can be provided to her. To date she has yet to return any of my messages.

## 2022-10-05 ENCOUNTER — Encounter
Admission: RE | Admit: 2022-10-05 | Discharge: 2022-10-05 | Disposition: A | Discharge status: Home / Self Care | Payer: Medicaid Other | Source: Ambulatory Visit | Attending: Surgery | Admitting: Surgery

## 2022-10-05 VITALS — Ht 65.0 in | Wt 156.5 lb

## 2022-10-05 DIAGNOSIS — F1220 Cannabis dependence, uncomplicated: Secondary | ICD-10-CM

## 2022-10-05 DIAGNOSIS — Z01818 Encounter for other preprocedural examination: Secondary | ICD-10-CM

## 2022-10-05 DIAGNOSIS — Z01812 Encounter for preprocedural laboratory examination: Secondary | ICD-10-CM

## 2022-10-05 DIAGNOSIS — R892 Abnormal level of other drugs, medicaments and biological substances in specimens from other organs, systems and tissues: Secondary | ICD-10-CM

## 2022-10-05 DIAGNOSIS — E876 Hypokalemia: Secondary | ICD-10-CM

## 2022-10-05 HISTORY — DX: Bipolar disorder, unspecified: F31.9

## 2022-10-05 HISTORY — DX: Ventral hernia without obstruction or gangrene: K43.9

## 2022-10-05 HISTORY — DX: Cannabis use, unspecified, uncomplicated: F12.90

## 2022-10-05 HISTORY — DX: Encounter for general psychiatric examination, requested by authority: Z04.6

## 2022-10-05 HISTORY — DX: Personal history of urinary calculi: Z87.442

## 2022-10-05 HISTORY — DX: Syphilis, unspecified: A53.9

## 2022-10-05 HISTORY — DX: Tobacco use: Z72.0

## 2022-10-05 HISTORY — DX: Unspecified osteoarthritis, unspecified site: M19.90

## 2022-10-05 HISTORY — DX: Anemia, unspecified: D64.9

## 2022-10-05 HISTORY — DX: Personal history of other mental and behavioral disorders: Z86.59

## 2022-10-05 HISTORY — DX: Gastro-esophageal reflux disease without esophagitis: K21.9

## 2022-10-05 HISTORY — DX: Chronic pain syndrome: G89.4

## 2022-10-05 HISTORY — DX: Personal history of suicidal behavior: Z91.51

## 2022-10-05 NOTE — Patient Instructions (Signed)
Your procedure is scheduled on:10-11-22 Tuesday Report to the Registration Desk on the 1st floor of the [facility].Then proceed to the 2nd floor Surgery Desk To find out your arrival time, please call [phone] between 1PM - 3PM on:10-10-22 Monday If your arrival time is 6:00 am, do not arrive before that time as the [facility] entrance doors do not open until 6:00 am.  REMEMBER: Instructions that are not followed completely may result in serious medical risk, up to and including death; or upon the discretion of your surgeon and anesthesiologist your surgery may need to be rescheduled.  Do not eat food after midnight the night before surgery.  No gum chewing or hard candies.  You may however, drink CLEAR liquids up to 2 hours before you are scheduled to arrive for your surgery. Do not drink anything within 2 hours of your scheduled arrival time.  Clear liquids include: - water  - apple juice without pulp - gatorade (not RED colors) - black coffee or tea (Do NOT add milk or creamers to the coffee or tea) Do NOT drink anything that is not on this list.  One week prior to surgery: Stop Anti-inflammatories (NSAIDS) such as Advil, Aleve, Ibuprofen, Motrin, Naproxen, Naprosyn and Aspirin based products such as Excedrin, Goody's Powder, BC Powder. Stop ANY OVER THE COUNTER supplements/vitamins NOW (10-05-22) until after surgery. You may however, continue to take Tylenol if needed for pain up until the day of surgery.  TAKE ONLY THESE MEDICATIONS THE MORNING OF SURGERY WITH A SIP OF WATER:  phenytoin (DILANTIN)   No Alcohol for 24 hours before or after surgery.  No Smoking including e-cigarettes for 24 hours before surgery.  No chewable tobacco products for at least 6 hours before surgery.  No nicotine patches on the day of surgery.  Do not use any "recreational" drugs for at least a week (preferably 2 weeks) before your surgery.  Please be advised that the combination of cocaine and  anesthesia may have negative outcomes, up to and including death. If you test positive for cocaine, your surgery will be cancelled.  On the morning of surgery brush your teeth with toothpaste and water, you may rinse your mouth with mouthwash if you wish. Do not swallow any toothpaste or mouthwash.  Use CHG Soap as directed on instruction sheet.  Do not wear jewelry, make-up, hairpins, clips or nail polish.  Do not wear lotions, powders, or perfumes.   Do not shave body hair from the neck down 48 hours before surgery.  Contact lenses, hearing aids and dentures may not be worn into surgery.  Do not bring valuables to the hospital. [facility] is not responsible for any missing/lost belongings or valuables.   Notify your doctor if there is any change in your medical condition (cold, fever, infection).  Wear comfortable clothing (specific to your surgery type) to the hospital.  After surgery, you can help prevent lung complications by doing breathing exercises.  Take deep breaths and cough every 1-2 hours. Your doctor may order a device called an Incentive Spirometer to help you take deep breaths. When coughing or sneezing, hold a pillow firmly against your incision with both hands. This is called "splinting." Doing this helps protect your incision. It also decreases belly discomfort.  If you are being admitted to the hospital overnight, leave your suitcase in the car. After surgery it may be brought to your room.  In case of increased patient census, it may be necessary for you, the  patient, to continue your postoperative care in the Same Day Surgery department.  If you are being discharged the day of surgery, you will not be allowed to drive home. You will need a responsible individual to drive you home and stay with you for 24 hours after surgery.   If you are taking public transportation, you will need to have a responsible individual with you.  Please call the [facility] Dept. at [phone] if you have any questions about these instructions.  Surgery Visitation Policy:  Patients undergoing a surgery or procedure may have two family members or support persons with them as long as the person is not COVID-19 positive or experiencing its symptoms.  Due to an increase in RSV and influenza rates and associated hospitalizations, children ages 59 and under will not be able to visit patients in [facility]. Masks continue to be strongly recommended.

## 2022-10-06 ENCOUNTER — Inpatient Hospital Stay: Admission: RE | Admit: 2022-10-06 | Payer: Medicaid Other | Source: Ambulatory Visit

## 2022-10-07 ENCOUNTER — Telehealth: Payer: Self-pay | Admitting: Urgent Care

## 2022-10-07 ENCOUNTER — Encounter
Admission: RE | Admit: 2022-10-07 | Discharge: 2022-10-07 | Disposition: A | Discharge status: Home / Self Care | Payer: Medicaid Other | Source: Ambulatory Visit | Attending: Surgery | Admitting: Surgery

## 2022-10-07 DIAGNOSIS — E876 Hypokalemia: Secondary | ICD-10-CM | POA: Diagnosis not present

## 2022-10-07 DIAGNOSIS — R892 Abnormal level of other drugs, medicaments and biological substances in specimens from other organs, systems and tissues: Secondary | ICD-10-CM

## 2022-10-07 DIAGNOSIS — Z01812 Encounter for preprocedural laboratory examination: Secondary | ICD-10-CM

## 2022-10-07 DIAGNOSIS — F1220 Cannabis dependence, uncomplicated: Secondary | ICD-10-CM

## 2022-10-07 LAB — BASIC METABOLIC PANEL
Anion gap: 8 (ref 5–15)
BUN: 18 mg/dL (ref 6–20)
CO2: 28 mmol/L (ref 22–32)
Calcium: 9.3 mg/dL (ref 8.9–10.3)
Chloride: 100 mmol/L (ref 98–111)
Creatinine, Ser: 0.83 mg/dL (ref 0.44–1.00)
GFR, Estimated: >60 mL/min (ref >60)
Glucose, Bld: 97 mg/dL (ref 70–99)
Potassium: 3.1 mmol/L — ABNORMAL LOW (ref 3.5–5.1)
Sodium: 136 mmol/L (ref 135–145)

## 2022-10-07 LAB — URINE DRUG SCREEN, QUALITATIVE (ARMC ONLY)
Amphetamines, Ur Screen: NOT DETECTED
Barbiturates, Ur Screen: NOT DETECTED
Benzodiazepine, Ur Scrn: NOT DETECTED
Cannabinoid 50 Ng, Ur ~~LOC~~: NOT DETECTED
Cocaine Metabolite,Ur ~~LOC~~: NOT DETECTED
MDMA (Ecstasy)Ur Screen: NOT DETECTED
Methadone Scn, Ur: NOT DETECTED
Opiate, Ur Screen: NOT DETECTED
Phencyclidine (PCP) Ur S: NOT DETECTED
Tricyclic, Ur Screen: NOT DETECTED

## 2022-10-07 MED ORDER — POTASSIUM CHLORIDE CRYS ER 20 MEQ PO TBCR: EXTENDED_RELEASE_TABLET | ORAL | 0 refills | Status: DC

## 2022-10-07 NOTE — Progress Notes (Signed)
  [facility] Perioperative Services: Pre-Admission/Anesthesia Testing  Abnormal Lab Notification and Treatment Plan of Care   Date: 10/07/22  Name: Karina Anderson MRN:   [id number]  Re: Abnormal labs noted during PAT appointment   Notified:  Provider Name Provider Role Notification Mode  Pabon, Bea Graff, MD General Surgery (Surgeon) Routed and/or faxed via [place] and Notes:  ABNORMAL LAB VALUE(S): Lab Results  Component Value Date   K 3.1 (L) 10/07/2022   [name] is scheduled for an elective [name] on 10/11/2021. In review of her medication reconciliation, it is noted that the patient is not taking prescribed diuretic medications.   Please note, in efforts to promote a safe and effective anesthetic course, per current guidelines/standards set by the [facility] anesthesia team, the minimal acceptable K+ level for the patient to proceed with general anesthesia is 3.0 mmol/L. With that being said, if the patient drops any lower, her elective procedure will need to be postponed until K+ is better optimized. In efforts to prevent case cancellation, will make efforts to optimize pre-surgical K+ level so that patient can safely undergo the planned surgical intervention.   Impression and Plan:  Karina Anderson found to be HYPOkalemic at 3.1 mmol/L on preoperative labs. She is not on diuretic therapy or supplemental potassium. Called patient to discuss results and plans for correction of noted electrolyte derangement as follows:  Meds ordered this encounter  Medications   potassium chloride SA (KLOR-CON M) 20 MEQ tablet    Sig: Take 2 tablets (40 mEq) today, then 1 tablet (20 mEq) BID x 3 days, then 1 tablet (20 mEq) on the day of surgery. Follow up with PCP for lab recheck.    Dispense:  9 tablet    Refill:  0   Encouraged patient to follow up with PCP about 2-3 weeks postoperatively to have labs rechecked to ensure that levels are remaining  within normal range. Discussed nutritional intake of K+ rich foods as an adjunctive way to keep her K+ levels normal; list of K+ rich foods verbally provided. Also mentioned ORS, however advised her not to rely solely on these drinks, as they are high in Na+, and she has a HTN diagnosis.   Will send copy of this note to surgeon to make him aware of K+ level and plans for correction. Patient verbalized understanding. Order entered to recheck K+ on the day of her surgery to ensure optimization.  Patient verbalized understanding of POC as it stands at this point. Wished patient the best of luck with her upcoming surgery and subsequent recovery. She was encouraged to return call to the PAT clinic, or to her surgeon's office, should any questions or concerns arise between now and the time of her surgery. Patient was appreciative of the care/concern expressed by PAT staff.   Encounter Diagnoses  Name Primary?   Pre-operative laboratory examination Yes   Hypokalemia    Honor Loh, MSN, APRN, FNP-C, CEN [facility]  Peri-operative Services Nurse Practitioner Phone: [phone] 10/07/22 12:03 PM  NOTE: This note has been prepared using Dragon dictation software. Despite my best ability to proofread, there is always the potential that unintentional transcriptional errors may still occur from this process.

## 2022-10-10 MED ORDER — LACTATED RINGERS IV SOLN: INTRAVENOUS | Status: DC

## 2022-10-10 MED ORDER — CHLORHEXIDINE GLUCONATE 0.12 % MT SOLN: 15.0000 mL | Freq: Once | OROMUCOSAL | Status: AC

## 2022-10-10 MED ORDER — CHLORHEXIDINE GLUCONATE CLOTH 2 % EX PADS: 6.0000 | MEDICATED_PAD | Freq: Once | CUTANEOUS | Status: DC

## 2022-10-10 MED ORDER — ACETAMINOPHEN 500 MG PO TABS: 1000.0000 mg | ORAL_TABLET | ORAL | Status: AC

## 2022-10-10 MED ORDER — FAMOTIDINE 20 MG PO TABS: 20.0000 mg | ORAL_TABLET | Freq: Once | ORAL | Status: AC

## 2022-10-10 MED ORDER — GABAPENTIN 300 MG PO CAPS: 300.0000 mg | ORAL_CAPSULE | ORAL | Status: AC

## 2022-10-10 MED ORDER — ORAL CARE MOUTH RINSE: 15.0000 mL | Freq: Once | OROMUCOSAL | Status: AC

## 2022-10-10 MED ORDER — CEFAZOLIN SODIUM-DEXTROSE 2-4 GM/100ML-% IV SOLN
2.0000 g | INTRAVENOUS | Status: AC
  Administered 2022-10-11: 2 g via INTRAVENOUS

## 2022-10-11 ENCOUNTER — Encounter
Admission: RE | Disposition: A | Discharge status: Home / Self Care | Payer: Self-pay | Source: Home / Self Care | Attending: Surgery

## 2022-10-11 ENCOUNTER — Encounter: Payer: Self-pay | Admitting: Surgery

## 2022-10-11 ENCOUNTER — Other Ambulatory Visit: Payer: Self-pay

## 2022-10-11 ENCOUNTER — Ambulatory Visit
Admission: RE | Admit: 2022-10-11 | Discharge: 2022-10-11 | Disposition: A | Discharge status: Home / Self Care | Payer: Medicaid Other | Attending: Surgery | Admitting: Surgery

## 2022-10-11 ENCOUNTER — Ambulatory Visit: Payer: Medicaid Other | Admitting: Certified Registered"

## 2022-10-11 ENCOUNTER — Ambulatory Visit: Payer: Medicaid Other | Admitting: Urgent Care

## 2022-10-11 DIAGNOSIS — G8929 Other chronic pain: Secondary | ICD-10-CM | POA: Insufficient documentation

## 2022-10-11 DIAGNOSIS — K439 Ventral hernia without obstruction or gangrene: Secondary | ICD-10-CM | POA: Diagnosis not present

## 2022-10-11 DIAGNOSIS — Z01812 Encounter for preprocedural laboratory examination: Secondary | ICD-10-CM

## 2022-10-11 DIAGNOSIS — F17210 Nicotine dependence, cigarettes, uncomplicated: Secondary | ICD-10-CM | POA: Insufficient documentation

## 2022-10-11 DIAGNOSIS — F1290 Cannabis use, unspecified, uncomplicated: Secondary | ICD-10-CM | POA: Diagnosis not present

## 2022-10-11 DIAGNOSIS — K436 Other and unspecified ventral hernia with obstruction, without gangrene: Secondary | ICD-10-CM | POA: Diagnosis present

## 2022-10-11 DIAGNOSIS — Z886 Allergy status to analgesic agent status: Secondary | ICD-10-CM | POA: Insufficient documentation

## 2022-10-11 DIAGNOSIS — K430 Incisional hernia with obstruction, without gangrene: Secondary | ICD-10-CM | POA: Diagnosis not present

## 2022-10-11 DIAGNOSIS — E876 Hypokalemia: Secondary | ICD-10-CM

## 2022-10-11 DIAGNOSIS — Z01818 Encounter for other preprocedural examination: Secondary | ICD-10-CM

## 2022-10-11 HISTORY — PX: VENTRAL HERNIA REPAIR: SHX424

## 2022-10-11 HISTORY — PX: INSERTION OF MESH: SHX5868

## 2022-10-11 LAB — POCT I-STAT, CHEM 8
BUN: 23 mg/dL — ABNORMAL HIGH (ref 6–20)
Calcium, Ion: 1.15 mmol/L (ref 1.15–1.40)
Chloride: 106 mmol/L (ref 98–111)
Creatinine, Ser: 0.7 mg/dL (ref 0.44–1.00)
Glucose, Bld: 92 mg/dL (ref 70–99)
HCT: 42 % (ref 36.0–46.0)
Hemoglobin: 14.3 g/dL (ref 12.0–15.0)
Potassium: 5.3 mmol/L — ABNORMAL HIGH (ref 3.5–5.1)
Sodium: 139 mmol/L (ref 135–145)
TCO2: 27 mmol/L (ref 22–32)

## 2022-10-11 SURGERY — REPAIR, HERNIA, VENTRAL
Anesthesia: General

## 2022-10-11 MED ORDER — BUPIVACAINE LIPOSOME 1.3 % IJ SUSP
INTRAMUSCULAR | Status: AC
  Filled 2022-10-11: qty 20

## 2022-10-11 MED ORDER — DEXMEDETOMIDINE HCL IN NACL 80 MCG/20ML IV SOLN
INTRAVENOUS | Status: DC | PRN
  Administered 2022-10-11: 8 ug via BUCCAL

## 2022-10-11 MED ORDER — PHENYLEPHRINE 80 MCG/ML (10ML) SYRINGE FOR IV PUSH (FOR BLOOD PRESSURE SUPPORT)
PREFILLED_SYRINGE | INTRAVENOUS | Status: AC
  Filled 2022-10-11: qty 10

## 2022-10-11 MED ORDER — BUPIVACAINE HCL (PF) 0.25 % IJ SOLN
INTRAMUSCULAR | Status: AC
  Filled 2022-10-11: qty 30

## 2022-10-11 MED ORDER — FENTANYL CITRATE (PF) 100 MCG/2ML IJ SOLN
INTRAMUSCULAR | Status: AC
  Filled 2022-10-11: qty 2

## 2022-10-11 MED ORDER — ACETAMINOPHEN 500 MG PO TABS
ORAL_TABLET | ORAL | Status: AC
  Administered 2022-10-11: 1000 mg via ORAL
  Filled 2022-10-11: qty 2

## 2022-10-11 MED ORDER — FENTANYL CITRATE (PF) 100 MCG/2ML IJ SOLN
INTRAMUSCULAR | Status: DC | PRN
  Administered 2022-10-11 (×4): 50 ug via INTRAVENOUS

## 2022-10-11 MED ORDER — FENTANYL CITRATE (PF) 100 MCG/2ML IJ SOLN: 25.0000 ug | INTRAMUSCULAR | Status: DC | PRN

## 2022-10-11 MED ORDER — LABETALOL HCL 5 MG/ML IV SOLN
INTRAVENOUS | Status: AC
  Filled 2022-10-11: qty 4

## 2022-10-11 MED ORDER — LIDOCAINE HCL (CARDIAC) PF 100 MG/5ML IV SOSY
PREFILLED_SYRINGE | INTRAVENOUS | Status: DC | PRN
  Administered 2022-10-11: 100 mg via INTRAVENOUS

## 2022-10-11 MED ORDER — PROPOFOL 10 MG/ML IV BOLUS
INTRAVENOUS | Status: AC
  Filled 2022-10-11: qty 40

## 2022-10-11 MED ORDER — BUPIVACAINE LIPOSOME 1.3 % IJ SUSP
INTRAMUSCULAR | Status: DC | PRN
  Administered 2022-10-11: 50 mL

## 2022-10-11 MED ORDER — MIDAZOLAM HCL 2 MG/2ML IJ SOLN
INTRAMUSCULAR | Status: AC
  Filled 2022-10-11: qty 2

## 2022-10-11 MED ORDER — CHLORHEXIDINE GLUCONATE 0.12 % MT SOLN
OROMUCOSAL | Status: AC
  Administered 2022-10-11: 15 mL via OROMUCOSAL
  Filled 2022-10-11: qty 15

## 2022-10-11 MED ORDER — PROPOFOL 10 MG/ML IV BOLUS
INTRAVENOUS | Status: DC | PRN
  Administered 2022-10-11: 150 mg via INTRAVENOUS

## 2022-10-11 MED ORDER — PHENYLEPHRINE HCL (PRESSORS) 10 MG/ML IV SOLN
INTRAVENOUS | Status: DC | PRN
  Administered 2022-10-11 (×2): 160 ug via INTRAVENOUS
  Administered 2022-10-11 (×3): 80 ug via INTRAVENOUS

## 2022-10-11 MED ORDER — GABAPENTIN 300 MG PO CAPS
ORAL_CAPSULE | ORAL | Status: AC
  Administered 2022-10-11: 300 mg via ORAL
  Filled 2022-10-11: qty 1

## 2022-10-11 MED ORDER — OXYCODONE HCL 5 MG PO TABS
5.0000 mg | ORAL_TABLET | Freq: Once | ORAL | Status: AC | PRN
  Administered 2022-10-11: 5 mg via ORAL

## 2022-10-11 MED ORDER — LABETALOL HCL 5 MG/ML IV SOLN
10.0000 mg | INTRAVENOUS | Status: DC | PRN
  Administered 2022-10-11: 10 mg via INTRAVENOUS

## 2022-10-11 MED ORDER — OXYCODONE HCL 5 MG PO TABS
ORAL_TABLET | ORAL | Status: AC
  Filled 2022-10-11: qty 1

## 2022-10-11 MED ORDER — DEXMEDETOMIDINE HCL IN NACL 80 MCG/20ML IV SOLN
INTRAVENOUS | Status: AC
  Filled 2022-10-11: qty 20

## 2022-10-11 MED ORDER — OXYCODONE-ACETAMINOPHEN 5-325 MG PO TABS: 1.0000 | ORAL_TABLET | ORAL | 0 refills | Status: DC | PRN

## 2022-10-11 MED ORDER — SUGAMMADEX SODIUM 200 MG/2ML IV SOLN
INTRAVENOUS | Status: DC | PRN
  Administered 2022-10-11: 200 mg via INTRAVENOUS

## 2022-10-11 MED ORDER — FAMOTIDINE 20 MG PO TABS
ORAL_TABLET | ORAL | Status: AC
  Administered 2022-10-11: 20 mg via ORAL
  Filled 2022-10-11: qty 1

## 2022-10-11 MED ORDER — ROCURONIUM BROMIDE 100 MG/10ML IV SOLN
INTRAVENOUS | Status: DC | PRN
  Administered 2022-10-11: 50 mg via INTRAVENOUS

## 2022-10-11 MED ORDER — CEFAZOLIN SODIUM-DEXTROSE 2-4 GM/100ML-% IV SOLN
INTRAVENOUS | Status: AC
  Filled 2022-10-11: qty 100

## 2022-10-11 MED ORDER — OXYCODONE HCL 5 MG/5ML PO SOLN: 5.0000 mg | Freq: Once | ORAL | Status: AC | PRN

## 2022-10-11 SURGICAL SUPPLY — 38 items
APL PRP STRL LF DISP 70% ISPRP (MISCELLANEOUS) ×2
APPLIER CLIP 11 MED OPEN (CLIP)
APPLIER CLIP 13 LRG OPEN (CLIP)
APR CLP LRG 13 20 CLIP (CLIP)
APR CLP MED 11 20 MLT OPN (CLIP)
BLADE CLIPPER SURG (BLADE) ×2 IMPLANT
CHLORAPREP W/TINT 26 (MISCELLANEOUS) ×2 IMPLANT
CLIP APPLIE 11 MED OPEN (CLIP) ×1 IMPLANT
CLIP APPLIE 13 LRG OPEN (CLIP) ×1 IMPLANT
DRAPE LAPAROTOMY 100X77 ABD (DRAPES) ×2 IMPLANT
DRSG TELFA 3X8 NADH STRL (GAUZE/BANDAGES/DRESSINGS) ×1 IMPLANT
ELECT CAUTERY BLADE 6.4 (BLADE) ×2 IMPLANT
ELECT REM PT RETURN 9FT ADLT (ELECTROSURGICAL) ×2
ELECTRODE REM PT RTRN 9FT ADLT (ELECTROSURGICAL) ×2 IMPLANT
GAUZE 4X4 16PLY ~~LOC~~+RFID DBL (SPONGE) ×1 IMPLANT
GAUZE SPONGE 4X4 12PLY STRL (GAUZE/BANDAGES/DRESSINGS) ×1 IMPLANT
GLOVE BIO SURGEON STRL SZ7 (GLOVE) ×6 IMPLANT
GOWN STRL REUS W/ TWL LRG LVL3 (GOWN DISPOSABLE) ×6 IMPLANT
GOWN STRL REUS W/TWL LRG LVL3 (GOWN DISPOSABLE) ×8
MANIFOLD NEPTUNE II (INSTRUMENTS) ×2 IMPLANT
MESH VENTRALEX ST 2.5 CRC MED (Mesh General) ×1 IMPLANT
NDL HYPO 25X1 1.5 SAFETY (NEEDLE) ×1 IMPLANT
NEEDLE HYPO 22GX1.5 SAFETY (NEEDLE) ×2 IMPLANT
NEEDLE HYPO 25X1 1.5 SAFETY (NEEDLE) IMPLANT
PACK BASIN MINOR ARMC (MISCELLANEOUS) ×2 IMPLANT
SPONGE T-LAP 18X18 ~~LOC~~+RFID (SPONGE) ×2 IMPLANT
STAPLER SKIN PROX 35W (STAPLE) ×2 IMPLANT
SUT ETHIBOND 0 MO6 C/R (SUTURE) ×3 IMPLANT
SUT MNCRL 4-0 (SUTURE) ×2
SUT MNCRL 4-0 27XMFL (SUTURE) ×2
SUT VIC AB 2-0 SH 27 (SUTURE) ×4
SUT VIC AB 2-0 SH 27XBRD (SUTURE) ×4 IMPLANT
SUTURE MNCRL 4-0 27XMF (SUTURE) ×1 IMPLANT
SYR 20ML LL LF (SYRINGE) ×2 IMPLANT
TAPE MICROFOAM 4IN (TAPE) ×1 IMPLANT
TRAP FLUID SMOKE EVACUATOR (MISCELLANEOUS) ×2 IMPLANT
WATER STERILE IRR 1000ML POUR (IV SOLUTION) ×1 IMPLANT
WATER STERILE IRR 500ML POUR (IV SOLUTION) ×1 IMPLANT

## 2022-10-11 NOTE — Interval H&P Note (Signed)
History and Physical Interval Note:  10/11/2022 10:15 AM  [name]  has presented today for surgery, with the diagnosis of ventral hernia 3 cm.  The various methods of treatment have been discussed with the patient and family. After consideration of risks, benefits and other options for treatment, the patient has consented to  Procedure(s): HERNIA REPAIR VENTRAL ADULT, open (N/A) as a surgical intervention.  The patient's history has been reviewed, patient examined, no change in status, stable for surgery.  I have reviewed the patient's chart and labs.  Questions were answered to the patient's satisfaction.     Karina Anderson

## 2022-10-11 NOTE — Op Note (Signed)
Ventral Hernia Repair with Mesh 6.4 cm ventralex  Pre-operative Diagnosis: ventral hernia  Post-operative Diagnosis: same  Surgeon: Caroleen Hamman, MD FACS  Anesthesia: Gen. with endotracheal tube   Findings: 3 cm ventral hernia chronically incarcerated  Estimated Blood Loss: 5cc                Specimens: sac          Complications: none              Procedure Details  The patient was seen again in the Holding Room. The benefits, complications, treatment options, and expected outcomes were discussed with the patient. The risks of bleeding, infection, recurrence of symptoms, failure to resolve symptoms, bowel injury, mesh placement, mesh infection, any of which could require further surgery were reviewed with the patient. The likelihood of improving the patient's symptoms with return to their baseline status is good.  The patient and/or family concurred with the proposed plan, giving informed consent.  The patient was taken to Operating Room, identified and the procedure verified.  A Time Out was held and the above information confirmed.  Prior to the induction of general anesthesia, antibiotic prophylaxis was administered. VTE prophylaxis was in place. General endotracheal anesthesia was then administered and tolerated well. After the induction, the abdomen was prepped with Chloraprep and draped in the sterile fashion. The patient was positioned in the supine position.  Incision was created with a scalpel over the hernia defect. Electrocautery was used to dissect through subcutaneous tissue, the hernia sac was opened excised.  The hernia was measured. A BARD 6.4 cm  Ventralex small patch selected and secure to the fascia in four quadrants using trans fascial sutures.. I closed the hernia defect with interrupted 0 Ethibond sutures.   Incision was closed in a 2 layer fashion with 3-0 Vicryl and 4-0 Monocryl. Dermabond was used to coat the skin. Liposomal Marcaine  was used to inject all  the incision sites. Patient tolerated procedure well and there were no immediate complications. Needle and laparotomy counts were correct

## 2022-10-11 NOTE — Anesthesia Procedure Notes (Signed)
Procedure Name: Intubation Date/Time: 10/11/2022 10:58 AM  Performed by: Chanetta Marshall, CRNAPre-anesthesia Checklist: Patient identified, Emergency Drugs available, Suction available and Patient being monitored Patient Re-evaluated:Patient Re-evaluated prior to induction Oxygen Delivery Method: Circle system utilized Preoxygenation: Pre-oxygenation with 100% oxygen Induction Type: IV induction Ventilation: Mask ventilation without difficulty Laryngoscope Size: McGraph and Miller Grade View: Grade II Tube type: Oral Tube size: 7.0 mm Number of attempts: 1 Airway Equipment and Method: Stylet and Video-laryngoscopy Placement Confirmation: ETT inserted through vocal cords under direct vision, positive ETCO2, breath sounds checked- equal and bilateral and CO2 detector Secured at: 22 cm Tube secured with: Tape Dental Injury: Teeth and Oropharynx as per pre-operative assessment

## 2022-10-11 NOTE — Anesthesia Postprocedure Evaluation (Signed)
Anesthesia Post Note  Patient: Karina Anderson  Procedure(s) Performed: HERNIA REPAIR VENTRAL ADULT, open INSERTION OF MESH  Patient location during evaluation: PACU Anesthesia Type: General Level of consciousness: awake and alert Pain management: pain level controlled Vital Signs Assessment: post-procedure vital signs reviewed and stable Respiratory status: spontaneous breathing, nonlabored ventilation, respiratory function stable and patient connected to nasal cannula oxygen Cardiovascular status: blood pressure returned to baseline and stable Postop Assessment: no apparent nausea or vomiting Anesthetic complications: no  No notable events documented.   Last Vitals:  Vitals:   10/11/22 1000 10/11/22 1216  BP: (!) 131/100 (!) 167/109  Pulse: (!) 105 96  Resp: 16 (!) 25  Temp: (!) 36.1 C (!) 36.1 C  SpO2: 96% 100%    Last Pain:  Vitals:   10/11/22 1216  TempSrc:   PainSc: [place]

## 2022-10-11 NOTE — Discharge Instructions (Addendum)
Ventral Hernia Repair, Care After The following information offers guidance on how to care for yourself after your procedure. Your health care provider may also give you more specific instructions. If you have problems or questions, contact your health care provider. What can I expect after the procedure? After the procedure, it is common to have pain, discomfort, or soreness. Follow these instructions at home: Medicines Take over-the-counter and prescription medicines only as told by your health care provider. Ask your health care provider if the medicine prescribed to you: Requires you to avoid driving or using machinery. Can cause constipation. You may need to take these actions to prevent or treat constipation: Drink enough fluid to keep your urine pale yellow. Take over-the-counter or prescription medicines. Eat foods that are high in fiber, such as beans, whole grains, and fresh fruits and vegetables. Limit foods that are high in fat and processed sugars, such as fried or sweet foods. Incision care  Follow instructions from your health care provider about how to take care of your incisions. Make sure you: Wash your hands with soap and water for at least 20 seconds before and after you change your bandage (dressing) or before you touch your abdomen. If soap and water are not available, use hand sanitizer. Change your dressing as told by your health care provider. Leave stitches (sutures), skin glue, or adhesive strips in place. These skin closures may need to stay in place for 2 weeks or longer. If adhesive strip edges start to loosen and curl up, you may trim the loose edges. Do not remove adhesive strips completely unless your health care provider tells you to do that. Check your incision areas every day for signs of infection. Check for: More redness, swelling, or pain. Fluid or blood. Warmth. Pus or a bad smell. Bathing  Do not take baths, swim, or use a hot tub until your health  care provider approves. Ask your health care provider if you may take showers. You may only be allowed to take sponge baths. Keep your dressing dry until your health care provider says it can be removed. Activity  Rest as told by your health care provider. Avoid sitting for a long time without moving. Get up to take short walks every 1-2 hours. This is important to improve blood flow and breathing. Ask for help if you feel weak or unsteady. Do not lift anything that is heavier than 20 lb  or the limit that you are told, until your health care provider says that it is safe. If you were given a sedative during the procedure, it can affect you for several hours. Do not drive or operate machinery until your health care provider says that it is safe. Return to your normal activities as told by your health care provider. Ask your health care provider what activities are safe for you. General instructions  Hold a pillow over your abdomen when you cough or sneeze. This helps with pain. Do not use any products that contain nicotine or tobacco. These products include cigarettes, chewing tobacco, and vaping devices, such as e-cigarettes. These can delay healing after surgery. If you need help quitting, ask your health care provider. You may be asked to continue to do deep breathing exercises at home. This will help to prevent a lung infection. Keep all follow-up visits. This is important. Contact a health care provider if: You have any of these signs of infection: More redness, swelling, or pain around an incision. Fluid or blood coming from  an incision. Warmth coming from an incision. Pus or a bad smell coming from an incision. A fever or chills. You have pain that gets worse or does not get better with medicine. You have nausea or vomiting. You have a cough. You have shortness of breath. You have not had a bowel movement in 3 days. You are not able to urinate. Get help right away if you  have: Severe pain in your abdomen. Persistent nausea and vomiting. Redness, warmth, or pain in your leg. Chest pain. Trouble breathing. These symptoms may represent a serious problem that is an emergency. Do not wait to see if the symptoms will go away. Get medical help right away. Call your local emergency services (911 in the [place] Do not drive yourself to the hospital. Summary After this procedure, it is common to have pain, discomfort, or soreness. Follow instructions from your health care provider about how to take care of your incision. Check your incision area every day for signs of infection. Report any signs of infection to your health care provider. Keep all follow-up visits. This is important. This information is not intended to replace advice given to you by your health care provider. Make sure you discuss any questions you have with your health care provider. Document Revised: 04/03/2020 Document Reviewed: 04/03/2020 Elsevier Patient Education  [place]   The drugs that you were given will stay in your system until tomorrow so for the next 24 hours you should not:  Drive an automobile Make any legal decisions Drink any alcoholic beverage   You may resume regular meals tomorrow.  Today it is better to start with liquids and gradually work up to solid foods.  You may eat anything you prefer, but it is better to start with liquids, then soup and crackers, and gradually work up to solid foods.   Please notify your doctor immediately if you have any unusual bleeding, trouble breathing, redness and pain at the surgery site, drainage, fever, or pain not relieved by medication.    Additional Instructions: PLEASE LEAVE TEAL/GREEN BRACELET ON FOR 4 DAYS         Please contact your physician with any problems or Same Day Surgery at [phone], Monday through Friday 6 am to 4 pm, or [place] at [facility]  number at [phone].

## 2022-10-11 NOTE — Transfer of Care (Signed)
Immediate Anesthesia Transfer of Care Note  Patient: Karina Anderson  Procedure(s) Performed: HERNIA REPAIR VENTRAL ADULT, open INSERTION OF MESH  Patient Location: PACU  Anesthesia Type:General  Level of Consciousness: awake, alert , and oriented  Airway & Oxygen Therapy: Patient Spontanous Breathing and Patient connected to face mask oxygen  Post-op Assessment: Report given to RN and Post -op Vital signs reviewed and stable  Post vital signs: Reviewed and stable  Last Vitals:  Vitals Value Taken Time  BP 165/107 10/11/22 1230  Temp    Pulse 87 10/11/22 1235  Resp 21 10/11/22 1235  SpO2 100 % 10/11/22 1235  Vitals shown include unvalidated device data.  Last Pain:  Vitals:   10/11/22 1216  TempSrc:   PainSc: Asleep         Complications: No notable events documented.

## 2022-10-11 NOTE — Anesthesia Preprocedure Evaluation (Signed)
Anesthesia Evaluation  Patient identified by MRN, date of birth, ID band Patient awake    Reviewed: Allergy & Precautions, NPO status , Patient's Chart, lab work & pertinent test results  History of Anesthesia Complications Negative for: history of anesthetic complications  Airway Mallampati: II  TM Distance: >3 FB Neck ROM: full    Dental  (+) Dental Advidsory Given, Teeth Intact   Pulmonary neg pulmonary ROS, neg COPD, Current Smoker and Patient abstained from smoking.   Pulmonary exam normal        Cardiovascular (-) angina (-) Past MI and (-) CABG negative cardio ROS Normal cardiovascular exam(-) pacemaker     Neuro/Psych  PSYCHIATRIC DISORDERS         GI/Hepatic Neg liver ROS,GERD  ,,  Endo/Other  negative endocrine ROS    Renal/GU      Musculoskeletal   Abdominal   Peds  Hematology negative hematology ROS (+)   Anesthesia Other Findings Past Medical History: No date: Chronic lower back pain No date: Hydradenitis No date: Seizures ([place])  Past Surgical History: No date: DILATION AND CURETTAGE OF UTERUS     Comment:  3 miscarriages No date: HYDRADENITIS EXCISION     Comment:  R underarm No date: TUBAL LIGATION 07/2020: TUMOR REMOVAL     Comment:  index finger on L hand     Reproductive/Obstetrics negative OB ROS                             Anesthesia Physical Anesthesia Plan  ASA: 2  Anesthesia Plan: General ETT   Post-op Pain Management:    Induction: Intravenous  PONV Risk Score and Plan: 4 or greater and Ondansetron, Dexamethasone, Midazolam and Treatment may vary due to age or medical condition  Airway Management Planned: Oral ETT  Additional Equipment:   Intra-op Plan:   Post-operative Plan: Extubation in OR  Informed Consent: I have reviewed the patients History and Physical, chart, labs and discussed the procedure including the risks, benefits and  alternatives for the proposed anesthesia with the patient or authorized representative who has indicated his/her understanding and acceptance.     Dental Advisory Given  Plan Discussed with: Anesthesiologist, CRNA and Surgeon  Anesthesia Plan Comments: (Patient consented for risks of anesthesia including but not limited to:  - adverse reactions to medications - damage to eyes, teeth, lips or other oral mucosa - nerve damage due to positioning  - sore throat or hoarseness - Damage to heart, brain, nerves, lungs, other parts of body or loss of life  Patient voiced understanding.)        Anesthesia Quick Evaluation

## 2022-10-12 ENCOUNTER — Encounter: Payer: Self-pay | Admitting: Surgery

## 2022-10-12 LAB — SURGICAL PATHOLOGY

## 2022-10-26 ENCOUNTER — Encounter: Payer: Self-pay | Admitting: Surgery

## 2022-10-26 ENCOUNTER — Ambulatory Visit (INDEPENDENT_AMBULATORY_CARE_PROVIDER_SITE_OTHER): Payer: 59 | Admitting: Surgery

## 2022-10-26 VITALS — BP 121/86 | HR 105 | Temp 98.2°F | Ht 65.0 in | Wt 151.6 lb

## 2022-10-26 DIAGNOSIS — Z09 Encounter for follow-up examination after completed treatment for conditions other than malignant neoplasm: Secondary | ICD-10-CM | POA: Diagnosis not present

## 2022-10-26 DIAGNOSIS — K430 Incisional hernia with obstruction, without gangrene: Secondary | ICD-10-CM

## 2022-10-26 MED ORDER — OXYCODONE-ACETAMINOPHEN 5-325 MG PO TABS: 1.0000 | ORAL_TABLET | ORAL | 0 refills | Status: DC | PRN

## 2022-10-26 NOTE — Patient Instructions (Signed)
If you have any concerns or questions, please feel free to call our office. Follow up as needed.   Laparoscopic Ventral Hernia Repair, Care After The following information offers guidance on how to care for yourself after your procedure. Your health care provider may also give you more specific instructions. If you have problems or questions, contact your health care provider. What can I expect after the procedure? After the procedure, it is common to have pain, discomfort, or soreness. Follow these instructions at home: Medicines Take over-the-counter and prescription medicines only as told by your health care provider. Ask your health care provider if the medicine prescribed to you: Requires you to avoid driving or using machinery. Can cause constipation. You may need to take these actions to prevent or treat constipation: Drink enough fluid to keep your urine pale yellow. Take over-the-counter or prescription medicines. Eat foods that are high in fiber, such as beans, whole grains, and fresh fruits and vegetables. Limit foods that are high in fat and processed sugars, such as fried or sweet foods. Incision care  Follow instructions from your health care provider about how to take care of your incisions. Make sure you: Wash your hands with soap and water for at least 20 seconds before and after you change your bandage (dressing) or before you touch your abdomen. If soap and water are not available, use hand sanitizer. Change your dressing as told by your health care provider. Leave stitches (sutures), skin glue, or adhesive strips in place. These skin closures may need to stay in place for 2 weeks or longer. If adhesive strip edges start to loosen and curl up, you may trim the loose edges. Do not remove adhesive strips completely unless your health care provider tells you to do that. Check your incision areas every day for signs of infection. Check for: More redness, swelling, or  pain. Fluid or blood. Warmth. Pus or a bad smell. Bathing  Do not take baths, swim, or use a hot tub until your health care provider approves. Ask your health care provider if you may take showers. You may only be allowed to take sponge baths. Keep your dressing dry until your health care provider says it can be removed. Activity  Rest as told by your health care provider. Avoid sitting for a long time without moving. Get up to take short walks every 1-2 hours. This is important to improve blood flow and breathing. Ask for help if you feel weak or unsteady. Do not lift anything that is heavier than 10 lb (4.5 kg), or the limit that you are told, until your health care provider says that it is safe. If you were given a sedative during the procedure, it can affect you for several hours. Do not drive or operate machinery until your health care provider says that it is safe. Return to your normal activities as told by your health care provider. Ask your health care provider what activities are safe for you. General instructions  Hold a pillow over your abdomen when you cough or sneeze. This helps with pain. Do not use any products that contain nicotine or tobacco. These products include cigarettes, chewing tobacco, and vaping devices, such as e-cigarettes. These can delay healing after surgery. If you need help quitting, ask your health care provider. You may be asked to continue to do deep breathing exercises at home. This will help to prevent a lung infection. Keep all follow-up visits. This is important. Contact a health care provider  if: You have any of these signs of infection: More redness, swelling, or pain around an incision. Fluid or blood coming from an incision. Warmth coming from an incision. Pus or a bad smell coming from an incision. A fever or chills. You have pain that gets worse or does not get better with medicine. You have nausea or vomiting. You have a cough. You have  shortness of breath. You have not had a bowel movement in 3 days. You are not able to urinate. Get help right away if you have: Severe pain in your abdomen. Persistent nausea and vomiting. Redness, warmth, or pain in your leg. Chest pain. Trouble breathing. These symptoms may represent a serious problem that is an emergency. Do not wait to see if the symptoms will go away. Get medical help right away. Call your local emergency services (911 in the [place] Do not drive yourself to the hospital. Summary After this procedure, it is common to have pain, discomfort, or soreness. Follow instructions from your health care provider about how to take care of your incision. Check your incision area every day for signs of infection. Report any signs of infection to your health care provider. Keep all follow-up visits. This is important. This information is not intended to replace advice given to you by your health care provider. Make sure you discuss any questions you have with your health care provider. Document Revised: 04/03/2020 Document Reviewed: 04/03/2020 Elsevier Patient Education  [place].

## 2022-10-27 NOTE — Progress Notes (Signed)
Outpatient Surgical Follow Up  10/27/2022  Karina Anderson is an 52 y.o. female.   Chief Complaint  Patient presents with   Follow-up    Ventral hernia repair 10/11/22     HPI: 2 weeks out from open ventral hernia repair with mesh.  She is doing well.  She continues to endorse some pain which is not unexpected given that she has chronic pain issues.  No fevers no chills no evidence of complications related to her hernia.  She is tolerating diet and is walking  Past Medical History:  Diagnosis Date   Anemia    Arthritis    Bipolar disorder (HCC)    Chronic lower back pain    Chronic pain disorder    GERD (gastroesophageal reflux disease)    History of kidney stones    History of psychosis    History of suicide attempt    Hydradenitis    Involuntary commitment    Marijuana use    Seizures ([place])    last was 2018   Syphilis    Tobacco use    Ventral hernia     Past Surgical History:  Procedure Laterality Date   CHOLECYSTECTOMY  04/15/2022   DILATION AND CURETTAGE OF UTERUS     3 miscarriages   HYDRADENITIS EXCISION     R underarm   INSERTION OF MESH  10/11/2022   Procedure: INSERTION OF MESH;  Surgeon: Jules Husbands, MD;  Location: [facility] ORS;  Service: General;;   TUBAL LIGATION     TUMOR REMOVAL  07/2020   index finger on L hand   VENTRAL HERNIA REPAIR N/A 10/11/2022   Procedure: HERNIA REPAIR VENTRAL ADULT, open;  Surgeon: Jules Husbands, MD;  Location: [facility] ORS;  Service: General;  Laterality: N/A;    Family History  Problem Relation Age of Onset   Seizures Mother    Diabetes Mother    Heart attack Father    Hypertension Brother    Eczema Daughter    Anxiety disorder Maternal Grandmother    Hypertension Brother    Hypertension Brother    Hypertension Brother    Hypertension Brother     Social History:  reports that she has been smoking cigarettes. She has a 20.00 pack-year smoking history. She has never used smokeless tobacco. She reports that she does not  currently use drugs after having used the following drugs: Marijuana. She reports that she does not drink alcohol.  Allergies:  Allergies  Allergen Reactions   Citrus Hives   Sulfamethoxazole-Trimethoprim Hives   Butorphanol Hives   Contrast Media [Iodinated Contrast Media] Nausea And Vomiting   Ibuprofen    Ketorolac    Lorazepam Hives   Naproxen    Prochlorperazine Hives   Nsaids Rash    Medications reviewed.    ROS Full ROS performed and is otherwise negative other than what is stated in HPI   BP 121/86   Pulse (!) 105   Temp 98.2 F (36.8 C) (Oral)   Ht '5\' 5"'$  (1.651 m)   Wt 151 lb 9.6 oz (68.8 kg)   LMP 09/10/2022 (Exact Date)   SpO2 98%   BMI 25.23 kg/m   Physical Exam Vitals and nursing note reviewed. Exam conducted with a chaperone present.  Constitutional:      General: She is not in acute distress.    Appearance: Normal appearance. She is not ill-appearing.  Pulmonary:     Effort: Pulmonary effort is normal.     Breath sounds: Normal  breath sounds. No stridor.  Abdominal:     General: Abdomen is flat. There is no distension.     Palpations: Abdomen is soft. There is no mass.     Tenderness: There is no abdominal tenderness. There is no guarding or rebound.     Hernia: No hernia is present.     Comments: Incision healing well without evidence of infection.  No recurrence.  No peritonitis  Musculoskeletal:        General: No swelling. Normal range of motion.  Skin:    General: Skin is warm and dry.     Capillary Refill: Capillary refill takes less than 2 seconds.  Neurological:     General: No focal deficit present.     Mental Status: She is alert and oriented to person, place, and time.  Psychiatric:        Mood and Affect: Mood normal.        Behavior: Behavior normal.        Thought Content: Thought content normal.        Judgment: Judgment normal.     Assessment/Plan: Karina Anderson is 2 weeks out following ventral hernia repair.  No evidence of  complications.  She does have some persistent pain and wishes to have a refill.  I had an extensive discussion with the patient regarding other modes of pain control.  I will only do 1 last refill and will only be for 15 pills.  She understands that this is going to be the last refill from our office.  Also offer her referral to pain clinic. Do not see evidence of complications.  Follow-up on a as needed basis I spent 20 minutes in this encounter including reviewing medical records, coordinating her care, placing orders and performing a proper documentation Caroleen Hamman, MD [place] Surgeon

## 2022-11-02 ENCOUNTER — Other Ambulatory Visit: Payer: Self-pay

## 2022-11-02 ENCOUNTER — Emergency Department: Payer: 59

## 2022-11-02 ENCOUNTER — Emergency Department
Admission: EM | Admit: 2022-11-02 | Discharge: 2022-11-02 | Disposition: A | Discharge status: Home / Self Care | Payer: 59 | Attending: Emergency Medicine | Admitting: Emergency Medicine

## 2022-11-02 DIAGNOSIS — G894 Chronic pain syndrome: Secondary | ICD-10-CM | POA: Diagnosis not present

## 2022-11-02 DIAGNOSIS — G8918 Other acute postprocedural pain: Secondary | ICD-10-CM

## 2022-11-02 DIAGNOSIS — R102 Pelvic and perineal pain: Secondary | ICD-10-CM | POA: Diagnosis not present

## 2022-11-02 LAB — COMPREHENSIVE METABOLIC PANEL
ALT: 10 U/L (ref 0–44)
AST: 17 U/L (ref 15–41)
Albumin: 3.8 g/dL (ref 3.5–5.0)
Alkaline Phosphatase: 70 U/L (ref 38–126)
Anion gap: 5 (ref 5–15)
BUN: 14 mg/dL (ref 6–20)
CO2: 29 mmol/L (ref 22–32)
Calcium: 9.4 mg/dL (ref 8.9–10.3)
Chloride: 108 mmol/L (ref 98–111)
Creatinine, Ser: 0.81 mg/dL (ref 0.44–1.00)
GFR, Estimated: >60 mL/min (ref >60)
Glucose, Bld: 102 mg/dL — ABNORMAL HIGH (ref 70–99)
Potassium: 3.3 mmol/L — ABNORMAL LOW (ref 3.5–5.1)
Sodium: 142 mmol/L (ref 135–145)
Total Bilirubin: 0.7 mg/dL (ref 0.3–1.2)
Total Protein: 7.2 g/dL (ref 6.5–8.1)

## 2022-11-02 LAB — CBC WITH DIFFERENTIAL/PLATELET
Abs Immature Granulocytes: 0 10*3/uL (ref 0.00–0.07)
Basophils Absolute: 0 10*3/uL (ref 0.0–0.1)
Basophils Relative: 1 %
Eosinophils Absolute: 0.2 10*3/uL (ref 0.0–0.5)
Eosinophils Relative: 5 %
HCT: 38.3 % (ref 36.0–46.0)
Hemoglobin: 12 g/dL (ref 12.0–15.0)
Immature Granulocytes: 0 %
Lymphocytes Relative: 32 %
Lymphs Abs: 1.1 10*3/uL (ref 0.7–4.0)
MCH: 27.6 pg (ref 26.0–34.0)
MCHC: 31.3 g/dL (ref 30.0–36.0)
MCV: 88 fL (ref 80.0–100.0)
Monocytes Absolute: 0.3 10*3/uL (ref 0.1–1.0)
Monocytes Relative: 9 %
Neutro Abs: 1.8 10*3/uL (ref 1.7–7.7)
Neutrophils Relative %: 53 %
Platelets: 273 10*3/uL (ref 150–400)
RBC: 4.35 MIL/uL (ref 3.87–5.11)
RDW: 13.7 % (ref 11.5–15.5)
WBC: 3.4 10*3/uL — ABNORMAL LOW (ref 4.0–10.5)
nRBC: 0 % (ref 0.0–0.2)

## 2022-11-02 MED ORDER — OXYCODONE-ACETAMINOPHEN 5-325 MG PO TABS
1.0000 | ORAL_TABLET | Freq: Once | ORAL | Status: AC
  Administered 2022-11-02: 1 via ORAL
  Filled 2022-11-02: qty 1

## 2022-11-02 MED ORDER — TRAMADOL HCL 50 MG PO TABS: 50.0000 mg | ORAL_TABLET | Freq: Four times a day (QID) | ORAL | 0 refills | Status: DC | PRN

## 2022-11-02 NOTE — Discharge Instructions (Signed)
Follow-up with the pain clinic.  Spoke with Dr. Dahlia Byes and he states that your CT is normal.  You are to follow-up with the pain clinic.

## 2022-11-02 NOTE — ED Triage Notes (Signed)
Pt to ED for pain to incision sites after surgery for hernia 2 weeks ago. Incisions are to periumbilical area and pain started 2 days ago. Pain is superficial, to skin area. Area is dry, not swollen or red, with no drainage noted. Pt rates pain 10/10. Ambulatory. Unlabored respirations.  Pt was on percocet post op and ran out 3 days ago.

## 2022-11-02 NOTE — ED Provider Notes (Signed)
[facility] Provider Note    Event Date/Time   First MD Initiated Contact with Patient 11/02/22 1200     (approximate)   History   Medication Refill   HPI  Karina Anderson is a 52 y.o. female with history of seizures, bipolar, hidradenitis, ventral hernia, and kidney stones presents emergency department with postop type pain.  Patient had a hernia repair approximately 10 days ago.  States that she ran out of her pain medication.  But is having low-grade fever along with pain in around the umbilicus and higher.  No vomiting or diarrhea.  No chest pain or shortness of breath.  Patient also states she is allergic to the contrast for CTs      Physical Exam   Triage Vital Signs: ED Triage Vitals  Enc Vitals Group     BP 11/02/22 1118 (!) 125/104     Pulse Rate 11/02/22 1118 (!) 105     Resp 11/02/22 1118 16     Temp 11/02/22 1119 98 F (36.7 C)     Temp Source 11/02/22 1118 Oral     SpO2 11/02/22 1118 95 %     Weight 11/02/22 1119 151 lb (68.5 kg)     Height 11/02/22 1119 '5\' 5"'$  (1.651 m)     Head Circumference --      Peak Flow --      Pain Score 11/02/22 1118 10     Pain Loc --      Pain Edu? --      Excl. in [place]? --     Most recent vital signs: Vitals:   11/02/22 1118 11/02/22 1119  BP: (!) 125/104   Pulse: (!) 105   Resp: 16   Temp:  98 F (36.7 C)  SpO2: 95%      General: Awake, no distress.   CV:  Good peripheral perfusion. regular rate and  rhythm Resp:  Normal effort. Lungs cta Abd:  No distention.  Tender at the umbilicus and mid abdomen, some tenderness in the lower abdomen no drainage from the incisions Other:      ED Results / Procedures / Treatments   Labs (all labs ordered are listed, but only abnormal results are displayed) Labs Reviewed  COMPREHENSIVE METABOLIC PANEL - Abnormal; Notable for the following components:      Result Value   Potassium 3.3 (*)    Glucose, Bld 102 (*)    All other components within normal  limits  CBC WITH DIFFERENTIAL/PLATELET - Abnormal; Notable for the following components:   WBC 3.4 (*)    All other components within normal limits     EKG     RADIOLOGY CT abdomen/pelvis    PROCEDURES:   Procedures   MEDICATIONS ORDERED IN ED: Medications  oxyCODONE-acetaminophen (PERCOCET/ROXICET) 5-325 MG per tablet 1 tablet (1 tablet Oral Given 11/02/22 1307)     IMPRESSION / MDM / ASSESSMENT AND PLAN / ED COURSE  I reviewed the triage vital signs and the nursing notes.                              Differential diagnosis includes, but is not limited to, abscess, seroma, hematoma, postop pain  Patient's presentation is most consistent with acute presentation with potential threat to life or bodily function.   Labs and imaging ordered   Labs are reassuring  CT abdomen/pelvis without contrast was independently reviewed and interpreted by me as  having stranding.  Consult to surgery, Dr. Lora Havens and his PA Loree Fee have evaluated the CT.  They state that there is no complication.  They also think she should have the pain clinic.  They told her she would not get any more narcotics from their office.  Plan at this time is to step down her narcotics to tramadol from the Percocet.  She was given ambulatory referral to the pain clinic.  Follow-up with her regular doctor if not improving in 3 to 4 days.  Return if worsening.  She was discharged in stable condition.   FINAL CLINICAL IMPRESSION(S) / ED DIAGNOSES   Final diagnoses:  Chronic pain syndrome  Post-op pain     Rx / DC Orders   ED Discharge Orders          Ordered    traMADol (ULTRAM) 50 MG tablet  Every 6 hours PRN        11/02/22 1408    Ambulatory referral to Pain Clinic        11/02/22 1410             Note:  This document was prepared using Dragon voice recognition software and may include unintentional dictation errors.    Versie Starks, PA-C 11/02/22 1412    Rada Hay, MD 11/02/22 [phone]

## 2022-11-21 ENCOUNTER — Telehealth: Payer: Self-pay

## 2022-11-21 NOTE — Telephone Encounter (Signed)
Pt came by to get record of her chronic back pain to send with her disability paperwork.

## 2022-12-12 ENCOUNTER — Ambulatory Visit: Payer: 59 | Admitting: Nurse Practitioner

## 2022-12-15 ENCOUNTER — Ambulatory Visit: Payer: Self-pay

## 2022-12-15 NOTE — Telephone Encounter (Signed)
  Chief Complaint: Back pain - needs pain medication Symptoms: lower back pain Frequency: Ongoing since birth of her child Pertinent Negatives: Patient denies  Disposition: ED /[x] Urgent Care (no appt availability in office) / Appointment(In office/virtual)/  [facility]/ Home Care/ Refused Recommended Disposition /[] [facility]/  Follow-up with PCP Additional Notes: Pt states that she has had lower back pain since the birth of her child. Her pain is 10/10. She states that her previous PCP her would not prescribe needed pain medications. Per pt testing revealed that her lower back is deteriorating. PT was referred to a pain clinic. Pt states that pain clinic gave her exercises to do which would not help her. PT will go to UC or ED for pain medication.    Reason for Disposition  [1] MODERATE back pain (e.g., interferes with normal activities) AND [2] present > 3 days  Answer Assessment - Initial Assessment Questions 1. ONSET: "When did the pain begin?"      Long time 2. LOCATION: "Where does it hurt?" (upper, mid or lower back)     Lower back 3. SEVERITY: "How bad is the pain?"  (e.g., Scale 1-10; mild, moderate, or severe)   - MILD (1-3): Doesn't interfere with normal activities.    - MODERATE (4-7): Interferes with normal activities or awakens from sleep.    - SEVERE (8-10): Excruciating pain, unable to do any normal activities.      severe 4. PATTERN: "Is the pain constant?" (e.g., yes, no; constant, intermittent)      Constant 5. RADIATION: "Does the pain shoot into your legs or somewhere else?"     Yes down to toes 6. CAUSE:  "What do you think is causing the back pain?"      Deteriorating lower back 7. BACK OVERUSE:  "Any recent lifting of heavy objects, strenuous work or exercise?"      8. MEDICINES: "What have you taken so far for the pain?" (e.g., nothing, acetaminophen, NSAIDS)     Narcotic pain meds.  9. NEUROLOGIC SYMPTOMS: "Do you have  any weakness, numbness, or problems with bowel/bladder control?"     no  Protocols used: Back Pain-A-AH

## 2023-01-01 ENCOUNTER — Emergency Department
Admission: EM | Admit: 2023-01-01 | Discharge: 2023-01-01 | Disposition: A | Discharge status: Home / Self Care | Payer: 59 | Attending: Emergency Medicine | Admitting: Emergency Medicine

## 2023-01-01 DIAGNOSIS — R109 Unspecified abdominal pain: Secondary | ICD-10-CM | POA: Diagnosis not present

## 2023-01-01 DIAGNOSIS — R1013 Epigastric pain: Secondary | ICD-10-CM | POA: Diagnosis not present

## 2023-01-01 DIAGNOSIS — E876 Hypokalemia: Secondary | ICD-10-CM | POA: Diagnosis not present

## 2023-01-01 DIAGNOSIS — K2900 Acute gastritis without bleeding: Secondary | ICD-10-CM | POA: Insufficient documentation

## 2023-01-01 DIAGNOSIS — R9431 Abnormal electrocardiogram [ECG] [EKG]: Secondary | ICD-10-CM | POA: Diagnosis not present

## 2023-01-01 LAB — COMPREHENSIVE METABOLIC PANEL
ALT: 11 U/L (ref 0–44)
AST: 16 U/L (ref 15–41)
Albumin: 4 g/dL (ref 3.5–5.0)
Alkaline Phosphatase: 71 U/L (ref 38–126)
Anion gap: 7 (ref 5–15)
BUN: 16 mg/dL (ref 6–20)
CO2: 28 mmol/L (ref 22–32)
Calcium: 9.6 mg/dL (ref 8.9–10.3)
Chloride: 102 mmol/L (ref 98–111)
Creatinine, Ser: 0.69 mg/dL (ref 0.44–1.00)
GFR, Estimated: >60 mL/min (ref >60)
Glucose, Bld: 90 mg/dL (ref 70–99)
Potassium: 3.4 mmol/L — ABNORMAL LOW (ref 3.5–5.1)
Sodium: 137 mmol/L (ref 135–145)
Total Bilirubin: 1 mg/dL (ref 0.3–1.2)
Total Protein: 7.7 g/dL (ref 6.5–8.1)

## 2023-01-01 LAB — URINALYSIS, ROUTINE W REFLEX MICROSCOPIC
Bacteria, UA: NONE SEEN
Bilirubin Urine: NEGATIVE
Glucose, UA: NEGATIVE mg/dL
Hgb urine dipstick: NEGATIVE
Ketones, ur: NEGATIVE mg/dL
Leukocytes,Ua: NEGATIVE
Nitrite: NEGATIVE
Protein, ur: NEGATIVE mg/dL
Specific Gravity, Urine: 1.02 (ref 1.005–1.030)
pH: 6 (ref 5.0–8.0)

## 2023-01-01 LAB — CBC
HCT: 38.1 % (ref 36.0–46.0)
Hemoglobin: 12.1 g/dL (ref 12.0–15.0)
MCH: 28 pg (ref 26.0–34.0)
MCHC: 31.8 g/dL (ref 30.0–36.0)
MCV: 88.2 fL (ref 80.0–100.0)
Platelets: 256 10*3/uL (ref 150–400)
RBC: 4.32 MIL/uL (ref 3.87–5.11)
RDW: 14.6 % (ref 11.5–15.5)
WBC: 4.9 10*3/uL (ref 4.0–10.5)
nRBC: 0 % (ref 0.0–0.2)

## 2023-01-01 LAB — TROPONIN I (HIGH SENSITIVITY): Troponin I (High Sensitivity): 4 ng/L (ref <18)

## 2023-01-01 LAB — POC URINE PREG, ED: Preg Test, Ur: NEGATIVE

## 2023-01-01 LAB — LIPASE, BLOOD: Lipase: 25 U/L (ref 11–51)

## 2023-01-01 MED ORDER — ACETAMINOPHEN 500 MG PO TABS
1000.0000 mg | ORAL_TABLET | Freq: Once | ORAL | Status: AC
  Administered 2023-01-01: 1000 mg via ORAL
  Filled 2023-01-01: qty 2

## 2023-01-01 MED ORDER — SUCRALFATE 1 G PO TABS: 1.0000 g | ORAL_TABLET | Freq: Three times a day (TID) | ORAL | 0 refills | Status: DC

## 2023-01-01 MED ORDER — ALUM & MAG HYDROXIDE-SIMETH 200-200-20 MG/5ML PO SUSP
30.0000 mL | Freq: Once | ORAL | Status: AC
  Administered 2023-01-01: 30 mL via ORAL
  Filled 2023-01-01: qty 30

## 2023-01-01 MED ORDER — PANTOPRAZOLE SODIUM 40 MG PO TBEC: 40.0000 mg | DELAYED_RELEASE_TABLET | Freq: Every day | ORAL | 0 refills | Status: DC

## 2023-01-01 NOTE — ED Triage Notes (Signed)
Pt reports abdominal pain for the past 3 weeks. Pt had gallstones removed (still has gallbladder) in August and umbilical hernia repair in Feb. Pt reports that the pain feels similar to the gallbladder pain.

## 2023-01-01 NOTE — Discharge Instructions (Signed)
Avoid alcohol, smoking, ibuprofen, aspirin, BC powder as these can all flareup her stomach.  She can take Tylenol 1 g every 8 hours to help with pain.  Use the acid reducer and Carafate to help line the stomach.  Return to the ER for fevers, worsening pain or any other concerns

## 2023-01-01 NOTE — ED Provider Notes (Signed)
[facility] Provider Note    Event Date/Time   First MD Initiated Contact with Patient 01/01/23 1617     (approximate)   History   Abdominal Pain   HPI  Karina Anderson is a 52 y.o. female with history of ventral hernia repair on 2/13, seizures, bipolar hidradenitis who comes in with concerns for abdominal pain.  Patient reports she is concerned about pain in her abdomen that could be related to her gallbladder.  Patient states that she never had her gallbladder removed I did review the records and patient did have her gallbladder previously removed.  She reports some epigastric tenderness that is worse after she tries to take ibuprofen.  Denies any chest pain, shortness of breath.  She reports the pain has been going on intermittently for the past 3 weeks.  Never goes away fully.   Physical Exam   Triage Vital Signs: ED Triage Vitals  Enc Vitals Group     BP 01/01/23 1423 96/66     Pulse Rate 01/01/23 1423 94     Resp 01/01/23 1423 18     Temp 01/01/23 1423 98.4 F (36.9 C)     Temp Source 01/01/23 1423 Oral     SpO2 01/01/23 1423 97 %     Weight 01/01/23 1427 154 lb (69.9 kg)     Height 01/01/23 1427 5\' 5"  (1.651 m)     Head Circumference --      Peak Flow --      Pain Score 01/01/23 1425 10     Pain Loc --      Pain Edu? --      Excl. in [place]? --     Most recent vital signs: Vitals:   01/01/23 1423  BP: 96/66  Pulse: 94  Resp: 18  Temp: 98.4 F (36.9 C)  SpO2: 97%     General: Awake, no distress.  CV:  Good peripheral perfusion.  Resp:  Normal effort.  Abd:  No distention.  Slight epigastric tenderness.  No rebound, no guarding Other:     ED Results / Procedures / Treatments   Labs (all labs ordered are listed, but only abnormal results are displayed) Labs Reviewed  COMPREHENSIVE METABOLIC PANEL - Abnormal; Notable for the following components:      Result Value   Potassium 3.4 (*)    All other components within normal limits   URINALYSIS, ROUTINE W REFLEX MICROSCOPIC - Abnormal; Notable for the following components:   Color, Urine YELLOW (*)    APPearance HAZY (*)    All other components within normal limits  LIPASE, BLOOD  CBC  POC URINE PREG, ED     EKG  My interpretation of EKG:  Normal sinus rhythm 99 without any ST elevation or T wave inversions, normal intervals   PROCEDURES:  Critical Care performed: No  Procedures   MEDICATIONS ORDERED IN ED: Medications  alum & mag hydroxide-simeth (MAALOX/MYLANTA) 200-200-20 MG/5ML suspension 30 mL (30 mLs Oral Given 01/01/23 1647)  acetaminophen (TYLENOL) tablet 1,000 mg (1,000 mg Oral Given 01/01/23 1646)     IMPRESSION / MDM / ASSESSMENT AND PLAN / ED COURSE  I reviewed the triage vital signs and the nursing notes.   Patient's presentation is most consistent with acute presentation with potential threat to life or bodily function.  Differentials choledocholithiasis, ACS, gastritis, ulcer  Preg test was negative.  Urine without evidence of UTI.  Lipase normal CMP slightly low potassium CBC normal.  Patient denies  any melena stool.  Troponin was negative symptoms of going on for greater than 3 hours  Patient given Tylenol, GI cocktail.  On repeat evaluation abdomen is soft and nontender she reports complete resolution of symptoms I suspect this is related to gastritis from ibuprofen.  We discussed avoiding this as well as sticking to Tylenol, PPI, Carafate.  Patient given GI's number for follow-up.  We discussed return precautions including bleeding which she expressed understanding and felt comfortable with discharge home     FINAL CLINICAL IMPRESSION(S) / ED DIAGNOSES   Final diagnoses:  Acute gastritis without hemorrhage, unspecified gastritis type     Rx / DC Orders   ED Discharge Orders          Ordered    pantoprazole (PROTONIX) 40 MG tablet  Daily        01/01/23 1728    sucralfate (CARAFATE) 1 g tablet  3 times daily with meals &  bedtime        01/01/23 1728             Note:  This document was prepared using Dragon voice recognition software and may include unintentional dictation errors.   Concha Se, MD 01/01/23 [phone]

## 2023-01-03 ENCOUNTER — Telehealth: Payer: Self-pay | Admitting: *Deleted

## 2023-01-03 NOTE — Transitions of Care (Post Inpatient/ED Visit) (Signed)
   01/03/2023  Name: Karina Anderson MRN: [id number] DOB: 05/13/1971  Today's TOC FU Call Status: Today's TOC FU Call Status:: Unsuccessul Call (1st Attempt) Unsuccessful Call (1st Attempt) Date: 01/03/23  Attempted to reach the patient regarding the most recent Inpatient/ED visit.  Follow Up Plan: Additional outreach attempts will be made to reach the patient to complete the Transitions of Care (Post Inpatient/ED visit) call.   Estanislado Emms RN, BSN [place]  Managed [facility] RN Care Coordinator [phone]

## 2023-03-08 ENCOUNTER — Telehealth: Payer: Self-pay

## 2023-03-08 NOTE — Telephone Encounter (Signed)
Noticed that his patient was last seen by [facility], however she was dismissed from the practice.

## 2023-03-08 NOTE — Telephone Encounter (Signed)
Copied from CRM [id number]. Topic: General - Inquiry >> Mar 07, 2023  4:30 PM Haroldine Laws wrote: Reason for CRM: pt wants a letter saying she needs to get a letter saying she is one seizure medication.  [phone]

## 2023-03-08 NOTE — Telephone Encounter (Signed)
Error

## 2023-03-08 NOTE — Telephone Encounter (Signed)
Copied from CRM #[id number]. Topic: General - Inquiry >> Mar 07, 2023  4:30 PM Teressa P wrote: Reason for CRM: pt wants a letter saying she needs to get a letter saying she is one seizure medication.  [phone] 

## 2023-03-13 ENCOUNTER — Encounter: Payer: Self-pay | Admitting: Emergency Medicine

## 2023-03-13 ENCOUNTER — Emergency Department
Admission: EM | Admit: 2023-03-13 | Discharge: 2023-03-13 | Disposition: A | Discharge status: Home / Self Care | Payer: 59 | Attending: Emergency Medicine | Admitting: Emergency Medicine

## 2023-03-13 ENCOUNTER — Other Ambulatory Visit: Payer: Self-pay

## 2023-03-13 DIAGNOSIS — K611 Rectal abscess: Secondary | ICD-10-CM | POA: Insufficient documentation

## 2023-03-13 DIAGNOSIS — L0291 Cutaneous abscess, unspecified: Secondary | ICD-10-CM

## 2023-03-13 DIAGNOSIS — L0231 Cutaneous abscess of buttock: Secondary | ICD-10-CM | POA: Diagnosis not present

## 2023-03-13 MED ORDER — DOXYCYCLINE HYCLATE 100 MG PO TABS: 100.0000 mg | ORAL_TABLET | Freq: Two times a day (BID) | ORAL | 0 refills | Status: DC

## 2023-03-13 MED ORDER — OXYCODONE-ACETAMINOPHEN 5-325 MG PO TABS: 1.0000 | ORAL_TABLET | ORAL | 0 refills | Status: DC | PRN

## 2023-03-13 NOTE — ED Provider Notes (Signed)
[facility] Provider Note    Event Date/Time   First MD Initiated Contact with Patient 03/13/23 1412     (approximate)   History   Abscess   HPI  Karina Anderson is a 52 y.o. female with history of hidradenitis presents emergency department with an abscess on the buttock.  Patient states that it has been sore for couple of days.  No fever or chills.  States she does not feel well.  Had to have surgery for abscesses in her right axilla due to hidradenitis.  States she does not have any power at her house and does not have a seat.  States when she gets hot these pop up more often.  States she has tried to call social services etc. to get help with her power bill.      Physical Exam   Triage Vital Signs: ED Triage Vitals [03/13/23 1319]  Encounter Vitals Group     BP (!) 125/101     Systolic BP Percentile      Diastolic BP Percentile      Pulse Rate (!) 109     Resp 17     Temp 98.6 F (37 C)     Temp Source Oral     SpO2 96 %     Weight 165 lb (74.8 kg)     Height 5\' 4"  (1.626 m)     Head Circumference      Peak Flow      Pain Score 10     Pain Loc      Pain Education      Exclude from Growth Chart     Most recent vital signs: Vitals:   03/13/23 1319  BP: (!) 125/101  Pulse: (!) 109  Resp: 17  Temp: 98.6 F (37 C)  SpO2: 96%     General: Awake, no distress.   CV:  Good peripheral perfusion. regular rate and  rhythm Resp:  Normal effort.  Abd:  No distention.   Other:  Small abscess noted right near the rectum, area is tender to palpation, but there is not a large amount of induration and there is no fluctuance   ED Results / Procedures / Treatments   Labs (all labs ordered are listed, but only abnormal results are displayed) Labs Reviewed - No data to display   EKG     RADIOLOGY     PROCEDURES:   Procedures   MEDICATIONS ORDERED IN ED: Medications - No data to display   IMPRESSION / MDM / ASSESSMENT AND  PLAN / ED COURSE  I reviewed the triage vital signs and the nursing notes.                              Differential diagnosis includes, but is not limited to, hidradenitis, abscess, rectal abscess, hemorrhoid  Patient's presentation is most consistent with acute illness / injury with system symptoms.   Due to the patient's past medical history of hidradenitis we will go ahead and treat the patient with an antibiotic for this area.  I did explain to her that at this time I do not feel we need to do an incision and drainage.  If it is worsening however she will need to return to the emergency department for most likely incision and drainage or even imaging to make sure this area does not track through.  Patient states she understands.  I did tell  her I could have the social worker call her as she requested.  She was given a prescription for Keflex for pain medication.  Discharged in stable condition.      FINAL CLINICAL IMPRESSION(S) / ED DIAGNOSES   Final diagnoses:  Abscess     Rx / DC Orders   ED Discharge Orders          Ordered    doxycycline (VIBRA-TABS) 100 MG tablet  2 times daily        03/13/23 1434    oxyCODONE-acetaminophen (PERCOCET) 5-325 MG tablet  Every 4 hours PRN        03/13/23 1434             Note:  This document was prepared using Dragon voice recognition software and may include unintentional dictation errors.    Faythe Ghee, PA-C 03/13/23 1528    Jene Every, MD 03/13/23 1534

## 2023-03-13 NOTE — ED Triage Notes (Signed)
Pt sts that she has a abscess between her buttocks.

## 2023-03-14 ENCOUNTER — Telehealth: Payer: Self-pay | Admitting: Obstetrics and Gynecology

## 2023-03-14 NOTE — Transitions of Care (Post Inpatient/ED Visit) (Signed)
   03/14/2023  Name: Karina Anderson MRN: [id number] DOB: Dec 27, 1970  Today's TOC FU Call Status: Today's TOC FU Call Status:: Unsuccessul Call (1st Attempt) Unsuccessful Call (1st Attempt) Date: 03/14/23  Attempted to reach the patient regarding the most recent Inpatient/ED visit.  Follow Up Plan: Additional outreach attempts will be made to reach the patient to complete the Transitions of Care (Post Inpatient/ED visit) call.   Kathi Der RN, BSN [place]  [employer] - Managed Medicaid High Risk [phone]

## 2023-03-16 ENCOUNTER — Telehealth: Payer: Self-pay | Admitting: Licensed Clinical Social Worker

## 2023-03-16 ENCOUNTER — Telehealth: Payer: Self-pay

## 2023-03-16 NOTE — Transitions of Care (Post Inpatient/ED Visit) (Signed)
   03/16/2023  Name: Nance Mccombs MRN: [id number] DOB: [birth year]  Today's TOC FU Call Status: Today's TOC FU Call Status:: Unsuccessful Call (2nd Attempt) Unsuccessful Call (2nd Attempt) Date: 03/16/23  Transition Care Management Follow-up Telephone Call Date of Discharge: 03/13/23 Discharge Facility: [facility]) Type of Discharge: Emergency Department  Dickie La, BSW, MSW, LCSW Managed Medicaid LCSW [facility]  [facility] [email or web address] Phone: [phone]

## 2023-03-16 NOTE — Telephone Encounter (Signed)
Faxed medical records to [facility] payment integrity department at [phone].

## 2023-03-20 ENCOUNTER — Emergency Department
Admission: EM | Admit: 2023-03-20 | Discharge: 2023-03-20 | Disposition: A | Discharge status: Home / Self Care | Payer: 59 | Attending: Emergency Medicine | Admitting: Emergency Medicine

## 2023-03-20 ENCOUNTER — Other Ambulatory Visit: Payer: Self-pay

## 2023-03-20 DIAGNOSIS — L0231 Cutaneous abscess of buttock: Secondary | ICD-10-CM | POA: Insufficient documentation

## 2023-03-20 DIAGNOSIS — L729 Follicular cyst of the skin and subcutaneous tissue, unspecified: Secondary | ICD-10-CM

## 2023-03-20 DIAGNOSIS — L732 Hidradenitis suppurativa: Secondary | ICD-10-CM

## 2023-03-20 MED ORDER — OXYCODONE-ACETAMINOPHEN 5-325 MG PO TABS
1.0000 | ORAL_TABLET | Freq: Once | ORAL | Status: AC
  Administered 2023-03-20: 1 via ORAL
  Filled 2023-03-20: qty 1

## 2023-03-20 NOTE — Discharge Instructions (Addendum)
Please take the doxycycline as prescribed until does is COMPLETED.   Keep wounds clean by washing them gently with soap and water when you bathe. Do not shave the areas where you get hidradenitis suppurativa. Wear loose-fitting clothes. Try to avoid getting overheated or sweaty. If you get sweaty or wet, change into clean, dry clothes as soon as you can. To help relieve pain and itchiness, cover sore areas with a warm, clean washcloth (warm compress) for 5-10 minutes as often as needed.

## 2023-03-20 NOTE — ED Provider Notes (Signed)
[facility] Emergency Department Provider Note     Event Date/Time   First MD Initiated Contact with Patient 03/20/23 1330     (approximate)   History   Cyst   HPI  Karina Anderson is a 52 y.o. female with a history of hidradenitis suppurativa presents to the ED with a cyst like bump in her buttock area. Patient was seen here on 07/15 for same complaint. Patient reports noticed this bump 7 days ago and progressively worsening in severity.  She denies spontaneous drainage. She has tried taking Tylenol and ibuprofen for pain with no relief. Patient is requesting for social services and pain medication.  Denies fever and chills.   Physical Exam   Triage Vital Signs: ED Triage Vitals  Encounter Vitals Group     BP 03/20/23 1247 (!) 143/118     Systolic BP Percentile --      Diastolic BP Percentile --      Pulse Rate 03/20/23 1247 94     Resp 03/20/23 1247 14     Temp 03/20/23 1247 98.4 F (36.9 C)     Temp src --      SpO2 03/20/23 1247 97 %     Weight 03/20/23 1248 165 lb (74.8 kg)     Height 03/20/23 1248 5\' 5"  (1.651 m)     Head Circumference --      Peak Flow --      Pain Score 03/20/23 1248 10     Pain Loc --      Pain Education --      Exclude from Growth Chart --     Most recent vital signs: Vitals:   03/20/23 1247 03/20/23 1524  BP: (!) 143/118 (!) 140/99  Pulse: 94 90  Resp: 14   Temp: 98.4 F (36.9 C)   SpO2: 97% 98%    General Awake, no distress.  HEENT NCAT. PERRL. EOMI. No rhinorrhea. Mucous membranes are moist.  CV:  Good peripheral perfusion.  RESP:  Normal effort.  ABD:  No distention.  Other:  There is a small pea size, mobile, firm cyst-like, lump on left buttock lateral to anus.  TTP. No fluctuance. Small surrounding induration.    ED Results / Procedures / Treatments   Labs (all labs ordered are listed, but only abnormal results are displayed) Labs Reviewed - No data to display  No results  found.   PROCEDURES:  Critical Care performed: No  Ultrasound ED Soft Tissue  Date/Time: 03/20/2023 3:11 PM  Performed by: Conrad [place], PA-C Authorized by: Kern Reap A, PA-C   Procedure details:    Indications: localization of abscess   Location:    Location: buttocks     Side:  Left  MEDICATIONS ORDERED IN ED: Medications  oxyCODONE-acetaminophen (PERCOCET/ROXICET) 5-325 MG per tablet 1 tablet (1 tablet Oral Given 03/20/23 1512)   IMPRESSION / MDM / ASSESSMENT AND PLAN / ED COURSE  I reviewed the triage vital signs and the nursing notes.                               52 y.o. female presents to the emergency department for evaluation and treatment of a small cyst progressively worsening in pain on the buttock region lateral to her anus. See HPI for further details.  Differential diagnosis includes, but is not limited to cyst, abscess, hemorrhoid, hidradenitis.    Patient was seen on 7/15  for same complaint. She was prescribed doxycycline at discharge. Patient admits to not taking medication as instructed. Ultrasound is performed at bedside by this provider. I&D is not indicated at this time. I encouraged patient to perform warm sitz baths at home and to ensure proper wiping and hygiene to the area as need. She is requesting a prescription for pain medication. Patient declines NSAIDs and is requesting for the pain as "percocet is the only medication that helps". PDMP reviewed. I thoroughly discussed with the patient that the ED cannot continue to distribute this type of medication for at home use. She will need to follow up with a PCP. She is given a percocet in the ED for her pain. She has agreed to pick up OTC ibuprofen at discharge, although an offer to send in this medication was provided to her.    Patient is in satisfactory and stable condition for discharge and outpatient follow up. Patient will be discharged home. She is advised to continue her doxycyline prescription  for optimal results as instructed.  Patient is given ED precautions to return to the ED for any worsening or new symptoms. Patient verbalizes understanding. All questions and concerns were addressed during ED visit.    Patient's presentation is most consistent with acute illness / injury with system symptoms.  FINAL CLINICAL IMPRESSION(S) / ED DIAGNOSES   Final diagnoses:  Cyst of buttocks   Rx / DC Orders   ED Discharge Orders     None        Note:  This document was prepared using Dragon voice recognition software and may include unintentional dictation errors.    Romeo Apple, Donyelle Enyeart A, PA-C 03/21/23 1643    Jene Every, MD 03/23/23 [phone]

## 2023-03-20 NOTE — ED Notes (Signed)
See triage notes. Patient stated she has a cyst between her butt cheeks

## 2023-03-20 NOTE — ED Triage Notes (Signed)
Pt c/o cyst in between her buttocks for the past week

## 2023-03-23 ENCOUNTER — Telehealth: Payer: Self-pay | Admitting: *Deleted

## 2023-03-23 NOTE — Transitions of Care (Post Inpatient/ED Visit) (Signed)
   03/23/2023  Name: Karina Anderson MRN: [id number] DOB: 06-28-1971  Today's TOC FU Call Status: Today's TOC FU Call Status:: Unsuccessul Call (1st Attempt) Unsuccessful Call (1st Attempt) Date: 03/23/23  Attempted to reach the patient regarding the most recent Inpatient/ED visit.  Follow Up Plan: Additional outreach attempts will be made to reach the patient to complete the Transitions of Care (Post Inpatient/ED visit) call.   Estanislado Emms RN, BSN [place]  Managed [facility] RN Care Coordinator [phone]

## 2023-03-29 ENCOUNTER — Other Ambulatory Visit: Payer: Self-pay

## 2023-03-29 ENCOUNTER — Emergency Department
Admission: EM | Admit: 2023-03-29 | Discharge: 2023-03-29 | Disposition: A | Discharge status: Home / Self Care | Payer: 59 | Attending: Emergency Medicine | Admitting: Emergency Medicine

## 2023-03-29 DIAGNOSIS — M545 Low back pain, unspecified: Secondary | ICD-10-CM | POA: Diagnosis not present

## 2023-03-29 DIAGNOSIS — M5459 Other low back pain: Secondary | ICD-10-CM | POA: Diagnosis not present

## 2023-03-29 MED ORDER — ACETAMINOPHEN-CODEINE 300-30 MG PO TABS: 1.0000 | ORAL_TABLET | Freq: Four times a day (QID) | ORAL | 0 refills | Status: DC | PRN

## 2023-03-29 MED ORDER — OXYCODONE-ACETAMINOPHEN 5-325 MG PO TABS
1.0000 | ORAL_TABLET | Freq: Once | ORAL | Status: AC
  Administered 2023-03-29: 1 via ORAL
  Filled 2023-03-29: qty 1

## 2023-03-29 NOTE — ED Triage Notes (Signed)
Pt presents to ED with c/o of back pain that started 4 days after moving her room around. Pt ambulatory with steady gait to triage. NAD noted.

## 2023-03-29 NOTE — ED Provider Notes (Signed)
[facility] Provider Note    Event Date/Time   First MD Initiated Contact with Patient 03/29/23 1137     (approximate)  History   Chief Complaint: Back Pain  HPI  Karina Anderson is a 52 y.o. female with a past medical she of arthritis, bipolar, gastric reflux, lower back pain, presents to the emergency department for an exacerbation of lower back pain.  According to the patient 4 days ago she was cleaning out her bedroom closet when she exacerbated her lower back pain.  Patient states a history of lower back pain but it has been worse since moving things in her closet.  Patient states she has gone through this many times in the past and is hoping for some pain relief.  States she has an appointment coming up with a back specialist in October, but the pain was worse so she came to the emergency department.  Patient denies any weakness or numbness of any arm or leg denies any incontinence.  States the pain is worse with movement especially bending.  Physical Exam   Triage Vital Signs: ED Triage Vitals  Encounter Vitals Group     BP 03/29/23 1040 (!) 141/108     Systolic BP Percentile --      Diastolic BP Percentile --      Pulse Rate 03/29/23 1040 (!) 111     Resp 03/29/23 1040 17     Temp 03/29/23 1040 98.3 F (36.8 C)     Temp src --      SpO2 03/29/23 1040 96 %     Weight --      Height --      Head Circumference --      Peak Flow --      Pain Score 03/29/23 1035 10     Pain Loc --      Pain Education --      Exclude from Growth Chart --     Most recent vital signs: Vitals:   03/29/23 1040  BP: (!) 141/108  Pulse: (!) 111  Resp: 17  Temp: 98.3 F (36.8 C)  SpO2: 96%    General: Awake, no distress.  CV:  Good peripheral perfusion.  Regular rate and rhythm  Resp:  Normal effort.  Equal breath sounds bilaterally.  Abd:  No distention.   Other:  Moderate midline and paraspinal lumbar tenderness to palpation.   ED Results / Procedures /  Treatments   MEDICATIONS ORDERED IN ED: Medications  oxyCODONE-acetaminophen (PERCOCET/ROXICET) 5-325 MG per tablet 1 tablet (has no administration in time range)     IMPRESSION / MDM / ASSESSMENT AND PLAN / ED COURSE  I reviewed the triage vital signs and the nursing notes.  Patient's presentation is most consistent with acute illness / injury with system symptoms.  Patient presents emergency department for acute onset of lower back pain.  History of lower back pain in the past with multiple exacerbations.  Has a appointment with a back specialist per patient in October but since cleaning out her closet 4 days ago has been experiencing increased pain.  No incontinence no weakness or numbness.  We will prescribe a short course of pain medication patient states she has taken Tylenol with codeine in the past with good relief.  I also discussed use of lidocaine patches which patient has been using.  Discussed my typical back pain return precautions.  Patient agreeable to plan of care.  FINAL CLINICAL IMPRESSION(S) / ED DIAGNOSES  Lower back pain  Rx / DC Orders   Tylenol with codeine  Note:  This document was prepared using Dragon voice recognition software and may include unintentional dictation errors.   Minna Antis, MD 03/29/23 1214

## 2023-04-17 ENCOUNTER — Other Ambulatory Visit: Payer: Self-pay

## 2023-04-17 ENCOUNTER — Emergency Department
Admission: EM | Admit: 2023-04-17 | Discharge: 2023-04-17 | Disposition: A | Discharge status: Home / Self Care | Payer: 59 | Attending: Emergency Medicine | Admitting: Emergency Medicine

## 2023-04-17 DIAGNOSIS — R1032 Left lower quadrant pain: Secondary | ICD-10-CM | POA: Diagnosis not present

## 2023-04-17 LAB — COMPREHENSIVE METABOLIC PANEL
ALT: 12 U/L (ref 0–44)
AST: 18 U/L (ref 15–41)
Albumin: 4.3 g/dL (ref 3.5–5.0)
Alkaline Phosphatase: 65 U/L (ref 38–126)
Anion gap: 8 (ref 5–15)
BUN: 20 mg/dL (ref 6–20)
CO2: 28 mmol/L (ref 22–32)
Calcium: 9.6 mg/dL (ref 8.9–10.3)
Chloride: 103 mmol/L (ref 98–111)
Creatinine, Ser: 0.78 mg/dL (ref 0.44–1.00)
GFR, Estimated: >60 mL/min (ref >60)
Glucose, Bld: 92 mg/dL (ref 70–99)
Potassium: 3.2 mmol/L — ABNORMAL LOW (ref 3.5–5.1)
Sodium: 139 mmol/L (ref 135–145)
Total Bilirubin: 1.3 mg/dL — ABNORMAL HIGH (ref 0.3–1.2)
Total Protein: 7.9 g/dL (ref 6.5–8.1)

## 2023-04-17 LAB — CBC
HCT: 37.2 % (ref 36.0–46.0)
Hemoglobin: 11.6 g/dL — ABNORMAL LOW (ref 12.0–15.0)
MCH: 27.2 pg (ref 26.0–34.0)
MCHC: 31.2 g/dL (ref 30.0–36.0)
MCV: 87.1 fL (ref 80.0–100.0)
Platelets: 292 10*3/uL (ref 150–400)
RBC: 4.27 MIL/uL (ref 3.87–5.11)
RDW: 14.6 % (ref 11.5–15.5)
WBC: 4.1 10*3/uL (ref 4.0–10.5)
nRBC: 0 % (ref 0.0–0.2)

## 2023-04-17 LAB — LIPASE, BLOOD: Lipase: 26 U/L (ref 11–51)

## 2023-04-17 LAB — URINALYSIS, ROUTINE W REFLEX MICROSCOPIC
Bilirubin Urine: NEGATIVE
Glucose, UA: NEGATIVE mg/dL
Hgb urine dipstick: NEGATIVE
Ketones, ur: NEGATIVE mg/dL
Leukocytes,Ua: NEGATIVE
Nitrite: NEGATIVE
Protein, ur: NEGATIVE mg/dL
Specific Gravity, Urine: 1.026 (ref 1.005–1.030)
pH: 5 (ref 5.0–8.0)

## 2023-04-17 MED ORDER — TRAMADOL HCL 50 MG PO TABS: 50.0000 mg | ORAL_TABLET | Freq: Four times a day (QID) | ORAL | 0 refills | Status: DC | PRN

## 2023-04-17 MED ORDER — AMOXICILLIN-POT CLAVULANATE 875-125 MG PO TABS: 1.0000 | ORAL_TABLET | Freq: Two times a day (BID) | ORAL | 0 refills | Status: AC

## 2023-04-17 NOTE — ED Triage Notes (Signed)
Pt presents to ED with c/o of ABD that started 5 days ago. NAD noted. Pt endorses diarrhea.

## 2023-04-17 NOTE — ED Provider Notes (Signed)
[facility] Provider Note    Event Date/Time   First MD Initiated Contact with Patient 04/17/23 1027     (approximate)   History   Abdominal Pain   HPI  Karina Anderson is a 51 y.o. female who presents with complaints of abdominal pain patient describes left-sided abdominal cramping sensation over the last several days.  She denies fevers or chills.  Some loose stools.  No vomiting.     Physical Exam   Triage Vital Signs: ED Triage Vitals [04/17/23 1020]  Encounter Vitals Group     BP (!) 122/96     Systolic BP Percentile      Diastolic BP Percentile      Pulse Rate 89     Resp 17     Temp 97.8 F (36.6 C)     Temp Source Oral     SpO2 100 %     Weight 74.8 kg (165 lb)     Height 1.651 m (5\' 5" )     Head Circumference      Peak Flow      Pain Score 10     Pain Loc      Pain Education      Exclude from Growth Chart     Most recent vital signs: Vitals:   04/17/23 1020  BP: (!) 122/96  Pulse: 89  Resp: 17  Temp: 97.8 F (36.6 C)  SpO2: 100%     General: Awake, no distress.  CV:  Good peripheral perfusion.  Resp:  Normal effort.  Abd:  No distention.  Soft, nontender, no CVA tenderness, reassuring exam Other:     ED Results / Procedures / Treatments   Labs (all labs ordered are listed, but only abnormal results are displayed) Labs Reviewed  COMPREHENSIVE METABOLIC PANEL - Abnormal; Notable for the following components:      Result Value   Potassium 3.2 (*)    Total Bilirubin 1.3 (*)    All other components within normal limits  CBC - Abnormal; Notable for the following components:   Hemoglobin 11.6 (*)    All other components within normal limits  URINALYSIS, ROUTINE W REFLEX MICROSCOPIC - Abnormal; Notable for the following components:   Color, Urine YELLOW (*)    APPearance CLEAR (*)    All other components within normal limits  LIPASE, BLOOD  POC URINE PREG, ED      EKG     RADIOLOGY     PROCEDURES:  Critical Care performed:   Procedures   MEDICATIONS ORDERED IN ED: Medications - No data to display   IMPRESSION / MDM / ASSESSMENT AND PLAN / ED COURSE  I reviewed the triage vital signs and the nursing notes. Patient's presentation is most consistent with acute illness / injury with system symptoms.  Patient presents with abdominal cramping as detailed above, overall well-appearing and in no acute distress, abdominal exam is reassuring, suspect mild colitis versus mild diverticulitis.  Lab work reviewed and is unremarkable, no indication for imaging at this time, will treat with Augmentin, close outpatient follow-up, return precautions discussed, she agrees to this plan.        FINAL CLINICAL IMPRESSION(S) / ED DIAGNOSES   Final diagnoses:  Left lower quadrant abdominal pain     Rx / DC Orders   ED Discharge Orders          Ordered    amoxicillin-clavulanate (AUGMENTIN) 875-125 MG tablet  2 times daily  04/17/23 1112    traMADol (ULTRAM) 50 MG tablet  Every 6 hours PRN        04/17/23 1112             Note:  This document was prepared using Dragon voice recognition software and may include unintentional dictation errors.   Jene Every, MD 04/17/23 [phone]

## 2023-04-24 ENCOUNTER — Ambulatory Visit: Payer: Self-pay

## 2023-04-24 NOTE — Telephone Encounter (Signed)
Pt. Has been dismissed from the practice. Left pt. Message that appointment for tomorrow will be cancelled and to go to ED for further seizure activity.

## 2023-04-24 NOTE — Telephone Encounter (Signed)
Summary: seziures?   Pt states that she keep having seizures and is wanting to see if her PCP can call in some medication for her.  Pt is still under [facility], will be a new pt a [place] in October.   [id number] - [place], [place] - [address] Phone: [phone] Fax: [phone]     Chief Complaint: Seizure last night, witnessed. Jerking,incontinent. Lasted 30 minutes. 911 not called.No availability today. Appointment tomorrow. Symptoms: Above Frequency: Out of medications x 1 year. Pertinent Negatives: Patient denies injury during seizure. Disposition: [] ED /[] Urgent Care (no appt availability in office) / [x] Appointment(In office/virtual)/ []  [facility]/ [] Home Care/ [] Refused Recommended Disposition /[] [facility]/ []  Follow-up with PCP Additional Notes: Instructed to call 911 for further seizures.   Reason for Disposition  Not on or ran out of seizure medicine (anticonvulsant)  Answer Assessment - Initial Assessment Questions 1. ONSET: "When did the seizure occur?"     Last one was last night 2. DURATION: "How long did the seizure last (or how long has it been happening)?" (e.g., seconds, minutes)  Note: Most seizures last less than 5 minutes.     30 minutes 3. DESCRIPTION: "Describe what happened during the seizure." "Did the body become stiff?" "Was there any jerking?"  "Did they lose consciousness during the seizure?"     Jerking, eyes rolled back 4. CIRCUMSTANCE: "What was the person doing when the seizure began?"      N/a 5. MENTAL STATUS AFTER SEIZURE: "Does the person seem more groggy or sleepy?" "Does the person know who they are, who you are, and where they are now?"      No 6. PRIOR SEIZURES: "Has the person had a seizure (convulsion) before?" (e.g., epilepsy, other cause)  If Yes, ask: "When was the last time?" and "What happened last time?"      Yes 7. EPILEPSY: "Does the person have epilepsy?" Note: Check  for medical ID bracelet.     N/a 8. MEDICINES: "Does the person take anticonvulsant medications?" (e.g., Yes, No; missed doses, any recent changes)     Has been out of medication x 1 year 9. INJURY: "Was the person hurt or injured during the seizure?" (e.g., hit their head, bit their tongue)     No 10. OTHER SYMPTOMS: "Are there any other symptoms?" (e.g., fever, headache)       N/a 11. PREGNANCY: "Is there any chance you are pregnant?" "When was your last menstrual period?"       No  Protocols used: [facility]

## 2023-04-25 ENCOUNTER — Ambulatory Visit: Payer: 59 | Admitting: Physician Assistant

## 2023-05-02 ENCOUNTER — Emergency Department: Payer: 59

## 2023-05-02 ENCOUNTER — Emergency Department
Admission: EM | Admit: 2023-05-02 | Discharge: 2023-05-02 | Disposition: A | Discharge status: Home / Self Care | Payer: 59 | Attending: Emergency Medicine | Admitting: Emergency Medicine

## 2023-05-02 ENCOUNTER — Other Ambulatory Visit: Payer: Self-pay

## 2023-05-02 DIAGNOSIS — R109 Unspecified abdominal pain: Secondary | ICD-10-CM | POA: Diagnosis not present

## 2023-05-02 DIAGNOSIS — R1030 Lower abdominal pain, unspecified: Secondary | ICD-10-CM | POA: Insufficient documentation

## 2023-05-02 DIAGNOSIS — R1084 Generalized abdominal pain: Secondary | ICD-10-CM | POA: Diagnosis not present

## 2023-05-02 DIAGNOSIS — I10 Essential (primary) hypertension: Secondary | ICD-10-CM | POA: Diagnosis not present

## 2023-05-02 DIAGNOSIS — R102 Pelvic and perineal pain: Secondary | ICD-10-CM | POA: Diagnosis not present

## 2023-05-02 DIAGNOSIS — Z743 Need for continuous supervision: Secondary | ICD-10-CM | POA: Diagnosis not present

## 2023-05-02 LAB — CBC WITH DIFFERENTIAL/PLATELET
Abs Immature Granulocytes: 0.01 10*3/uL (ref 0.00–0.07)
Basophils Absolute: 0 10*3/uL (ref 0.0–0.1)
Basophils Relative: 1 %
Eosinophils Absolute: 0.2 10*3/uL (ref 0.0–0.5)
Eosinophils Relative: 5 %
HCT: 34.4 % — ABNORMAL LOW (ref 36.0–46.0)
Hemoglobin: 11.3 g/dL — ABNORMAL LOW (ref 12.0–15.0)
Immature Granulocytes: 0 %
Lymphocytes Relative: 36 %
Lymphs Abs: 1.1 10*3/uL (ref 0.7–4.0)
MCH: 28.3 pg (ref 26.0–34.0)
MCHC: 32.8 g/dL (ref 30.0–36.0)
MCV: 86 fL (ref 80.0–100.0)
Monocytes Absolute: 0.3 10*3/uL (ref 0.1–1.0)
Monocytes Relative: 9 %
Neutro Abs: 1.6 10*3/uL — ABNORMAL LOW (ref 1.7–7.7)
Neutrophils Relative %: 49 %
Platelets: 240 10*3/uL (ref 150–400)
RBC: 4 MIL/uL (ref 3.87–5.11)
RDW: 15 % (ref 11.5–15.5)
WBC: 3.2 10*3/uL — ABNORMAL LOW (ref 4.0–10.5)
nRBC: 0 % (ref 0.0–0.2)

## 2023-05-02 LAB — COMPREHENSIVE METABOLIC PANEL
ALT: 10 U/L (ref 0–44)
AST: 19 U/L (ref 15–41)
Albumin: 3.4 g/dL — ABNORMAL LOW (ref 3.5–5.0)
Alkaline Phosphatase: 56 U/L (ref 38–126)
Anion gap: 6 (ref 5–15)
BUN: 14 mg/dL (ref 6–20)
CO2: 26 mmol/L (ref 22–32)
Calcium: 9.1 mg/dL (ref 8.9–10.3)
Chloride: 104 mmol/L (ref 98–111)
Creatinine, Ser: 0.8 mg/dL (ref 0.44–1.00)
GFR, Estimated: >60 mL/min (ref >60)
Glucose, Bld: 83 mg/dL (ref 70–99)
Potassium: 4.2 mmol/L (ref 3.5–5.1)
Sodium: 136 mmol/L (ref 135–145)
Total Bilirubin: 0.9 mg/dL (ref 0.3–1.2)
Total Protein: 6.7 g/dL (ref 6.5–8.1)

## 2023-05-02 LAB — URINALYSIS, W/ REFLEX TO CULTURE (INFECTION SUSPECTED)
Bilirubin Urine: NEGATIVE
Glucose, UA: NEGATIVE mg/dL
Hgb urine dipstick: NEGATIVE
Ketones, ur: NEGATIVE mg/dL
Nitrite: NEGATIVE
Protein, ur: NEGATIVE mg/dL
Specific Gravity, Urine: 1.025 (ref 1.005–1.030)
Squamous Epithelial / HPF: >50 /HPF (ref 0–5)
pH: 6 (ref 5.0–8.0)

## 2023-05-02 LAB — POC URINE PREG, ED: Preg Test, Ur: NEGATIVE

## 2023-05-02 LAB — LIPASE, BLOOD: Lipase: 25 U/L (ref 11–51)

## 2023-05-02 MED ORDER — ACETAMINOPHEN 500 MG PO TABS
1000.0000 mg | ORAL_TABLET | Freq: Once | ORAL | Status: AC
  Administered 2023-05-02: 1000 mg via ORAL
  Filled 2023-05-02: qty 2

## 2023-05-02 MED ORDER — OXYCODONE-ACETAMINOPHEN 5-325 MG PO TABS: 1.0000 | ORAL_TABLET | Freq: Three times a day (TID) | ORAL | 0 refills | Status: DC | PRN

## 2023-05-02 NOTE — Discharge Instructions (Signed)
Fortunately your testing in the emergency department including blood test, urine test, and a CT scan of your abdomen and pelvis showed no emergency findings to explain your pain.  Take Tylenol 6 of milligrams every 6 hours as needed for pain.  Follow-up with both your primary doctor and gynecologist for further testing as needed.   Thank you for choosing [place] for your health care today!  Please see your primary doctor this week for a follow up appointment.   If you have any new, worsening, or unexpected symptoms call your doctor right away or come back to the emergency department for reevaluation.  It was my pleasure to care for you today.   Daneil Dan [place]esto Charon, MD

## 2023-05-02 NOTE — ED Triage Notes (Signed)
Pt to ED via [facility] from home for c/o abd pain that started last night and radiates to the back. Pt has hx of kidney stones.

## 2023-05-02 NOTE — ED Provider Notes (Signed)
[facility] Provider Note    Event Date/Time   First MD Initiated Contact with Patient 05/02/23 [phone]     (approximate)   History   Abdominal Pain   HPI  Karina Anderson is a 52 y.o. female   Past medical history of chronic back pain, kidney stone, bipolar, psychosis, seizures, substance use, who presents emerged apartment with suprapubic pain radiating to bilateral flanks since last night.  No dysuria, fever, chills.   No nausea or vomiting.  No obvious inciting event, denies vaginal discharge.  She missed her last menstrual period last month and believes that her previous tubal ligation clamp "fell off" and wants to be checked for pregnancy.  She declines STI testing.  Independent Historian contributed to assessment above: EMS gives report similar to above, reporting normal vital signs en route during transport       Physical Exam   Triage Vital Signs: ED Triage Vitals  Encounter Vitals Group     BP      Systolic BP Percentile      Diastolic BP Percentile      Pulse      Resp      Temp      Temp src      SpO2      Weight      Height      Head Circumference      Peak Flow      Pain Score      Pain Loc      Pain Education      Exclude from Growth Chart     Most recent vital signs: Vitals:   05/02/23 0729  BP: (!) 140/104  Pulse: 83  Resp: 16  Temp: 97.9 F (36.6 C)  SpO2: 98%    General: Awake, no distress.  CV:  Good peripheral perfusion.  Resp:  Normal effort.  Abd:  No distention.  Other:  Comfortable appearing slightly hypertensive otherwise vital signs normal.  Nontoxic-appearing.  No CVA tenderness.  No tenderness to palpation to the abdomen, no rigidity or guarding.   ED Results / Procedures / Treatments   Labs (all labs ordered are listed, but only abnormal results are displayed) Labs Reviewed  COMPREHENSIVE METABOLIC PANEL - Abnormal; Notable for the following components:      Result Value   Albumin 3.4 (*)     All other components within normal limits  URINALYSIS, W/ REFLEX TO CULTURE (INFECTION SUSPECTED) - Abnormal; Notable for the following components:   APPearance CLOUDY (*)    Leukocytes,Ua TRACE (*)    Bacteria, UA RARE (*)    All other components within normal limits  CBC WITH DIFFERENTIAL/PLATELET - Abnormal; Notable for the following components:   WBC 3.2 (*)    Hemoglobin 11.3 (*)    HCT 34.4 (*)    Neutro Abs 1.6 (*)    All other components within normal limits  LIPASE, BLOOD  CBC WITH DIFFERENTIAL/PLATELET  POC URINE PREG, ED     I ordered and reviewed the above labs they are notable for negative urine pregnancy test.  And many squamous epithelial cells, rare bacteria no inflammatory changes in the urine.    RADIOLOGY I independently reviewed and interpreted CT scan of the abdomen pelvis and see if you hyperdensities in the lower pelvis which may represent surgical clips from tubal ligation or ureterolithiasis I also reviewed radiologist's formal read.   PROCEDURES:  Critical Care performed: No  Procedures   MEDICATIONS ORDERED  IN ED: Medications  acetaminophen (TYLENOL) tablet 1,000 mg (1,000 mg Oral Given 05/02/23 0849)    IMPRESSION / MDM / ASSESSMENT AND PLAN / ED COURSE  I reviewed the triage vital signs and the nursing notes.                                Patient's presentation is most consistent with acute presentation with potential threat to life or bodily function.  Differential diagnosis includes, but is not limited to, urinary tract infection, kidney stone, appendicitis, menstrual cramping, pregnancy related, ovarian torsion   The patient is on the cardiac monitor to evaluate for evidence of arrhythmia and/or significant heart rate changes.  MDM:    Suprapubic discomfort exacerbated by urination once associated bilateral flank pain concerning most likely for urinary tract infection, kidney stones.  Check basic labs, pregnancy test, urinalysis and  CT flank.  Benign abdominal exam comfortable appearing doubt appendicitis or other surgical abdominal pathologies at this time.  Location of pain, constant nature, mild I considered but doubt ovarian torsion.  Workup completed and unremarkable so I doubt abdominal/pelvic emergency given normal lab testing urinalysis and CAT scan imaging of the abdomen pelvis..  Patient stable, anticipatory guidance for pain, follow-up with PMD and gynecologist.  Return with worsening.      FINAL CLINICAL IMPRESSION(S) / ED DIAGNOSES   Final diagnoses:  Lower abdominal pain     Rx / DC Orders   ED Discharge Orders     None        Note:  This document was prepared using Dragon voice recognition software and may include unintentional dictation errors.    Pilar Jarvis, MD 05/02/23 [phone]

## 2023-05-08 ENCOUNTER — Emergency Department
Admission: EM | Admit: 2023-05-08 | Discharge: 2023-05-08 | Disposition: A | Discharge status: Home / Self Care | Payer: 59 | Attending: Emergency Medicine | Admitting: Emergency Medicine

## 2023-05-08 ENCOUNTER — Encounter: Payer: Self-pay | Admitting: Emergency Medicine

## 2023-05-08 ENCOUNTER — Other Ambulatory Visit: Payer: Self-pay

## 2023-05-08 ENCOUNTER — Emergency Department: Payer: 59

## 2023-05-08 DIAGNOSIS — N200 Calculus of kidney: Secondary | ICD-10-CM | POA: Diagnosis not present

## 2023-05-08 DIAGNOSIS — R197 Diarrhea, unspecified: Secondary | ICD-10-CM | POA: Insufficient documentation

## 2023-05-08 DIAGNOSIS — Z9049 Acquired absence of other specified parts of digestive tract: Secondary | ICD-10-CM | POA: Diagnosis not present

## 2023-05-08 DIAGNOSIS — R1031 Right lower quadrant pain: Secondary | ICD-10-CM | POA: Insufficient documentation

## 2023-05-08 DIAGNOSIS — R112 Nausea with vomiting, unspecified: Secondary | ICD-10-CM | POA: Diagnosis not present

## 2023-05-08 LAB — CBC
HCT: 37.7 % (ref 36.0–46.0)
Hemoglobin: 12.3 g/dL (ref 12.0–15.0)
MCH: 28.1 pg (ref 26.0–34.0)
MCHC: 32.6 g/dL (ref 30.0–36.0)
MCV: 86.3 fL (ref 80.0–100.0)
Platelets: 296 10*3/uL (ref 150–400)
RBC: 4.37 MIL/uL (ref 3.87–5.11)
RDW: 15.2 % (ref 11.5–15.5)
WBC: 3.9 10*3/uL — ABNORMAL LOW (ref 4.0–10.5)
nRBC: 0 % (ref 0.0–0.2)

## 2023-05-08 LAB — URINALYSIS, ROUTINE W REFLEX MICROSCOPIC
Bilirubin Urine: NEGATIVE
Glucose, UA: NEGATIVE mg/dL
Hgb urine dipstick: NEGATIVE
Ketones, ur: NEGATIVE mg/dL
Leukocytes,Ua: NEGATIVE
Nitrite: NEGATIVE
Protein, ur: NEGATIVE mg/dL
Specific Gravity, Urine: 1.023 (ref 1.005–1.030)
pH: 5 (ref 5.0–8.0)

## 2023-05-08 LAB — COMPREHENSIVE METABOLIC PANEL
ALT: 15 U/L (ref 0–44)
AST: 19 U/L (ref 15–41)
Albumin: 4.1 g/dL (ref 3.5–5.0)
Alkaline Phosphatase: 56 U/L (ref 38–126)
Anion gap: 10 (ref 5–15)
BUN: 21 mg/dL — ABNORMAL HIGH (ref 6–20)
CO2: 28 mmol/L (ref 22–32)
Calcium: 9.5 mg/dL (ref 8.9–10.3)
Chloride: 103 mmol/L (ref 98–111)
Creatinine, Ser: 0.84 mg/dL (ref 0.44–1.00)
GFR, Estimated: >60 mL/min (ref >60)
Glucose, Bld: 88 mg/dL (ref 70–99)
Potassium: 3.5 mmol/L (ref 3.5–5.1)
Sodium: 141 mmol/L (ref 135–145)
Total Bilirubin: 0.9 mg/dL (ref 0.3–1.2)
Total Protein: 7.3 g/dL (ref 6.5–8.1)

## 2023-05-08 LAB — POC URINE PREG, ED: Preg Test, Ur: NEGATIVE

## 2023-05-08 LAB — LIPASE, BLOOD: Lipase: 27 U/L (ref 11–51)

## 2023-05-08 MED ORDER — LACTATED RINGERS IV BOLUS
1000.0000 mL | Freq: Once | INTRAVENOUS | Status: AC
  Administered 2023-05-08: 1000 mL via INTRAVENOUS

## 2023-05-08 MED ORDER — DROPERIDOL 2.5 MG/ML IJ SOLN
1.2500 mg | Freq: Once | INTRAMUSCULAR | Status: AC
  Administered 2023-05-08: 1.25 mg via INTRAVENOUS
  Filled 2023-05-08: qty 2

## 2023-05-08 MED ORDER — FENTANYL CITRATE PF 50 MCG/ML IJ SOSY
50.0000 ug | PREFILLED_SYRINGE | Freq: Once | INTRAMUSCULAR | Status: DC
  Filled 2023-05-08: qty 1

## 2023-05-08 MED ORDER — MORPHINE SULFATE (PF) 4 MG/ML IV SOLN
4.0000 mg | Freq: Once | INTRAVENOUS | Status: AC
  Administered 2023-05-08: 4 mg via INTRAVENOUS
  Filled 2023-05-08: qty 1

## 2023-05-08 MED ORDER — ONDANSETRON HCL 4 MG PO TABS: 4.0000 mg | ORAL_TABLET | Freq: Three times a day (TID) | ORAL | 0 refills | Status: DC | PRN

## 2023-05-08 MED ORDER — ONDANSETRON HCL 4 MG/2ML IJ SOLN
4.0000 mg | Freq: Once | INTRAMUSCULAR | Status: AC
  Administered 2023-05-08: 4 mg via INTRAVENOUS
  Filled 2023-05-08: qty 2

## 2023-05-08 NOTE — ED Triage Notes (Signed)
Patient to ED via POV for RLQ abd pain x3 days. States pain radiates into back with vomiting.

## 2023-05-08 NOTE — ED Provider Notes (Signed)
[facility] Provider Note    Event Date/Time   First MD Initiated Contact with Patient 05/08/23 [phone]     (approximate)   History   Abdominal Pain   HPI Karina Anderson is a 52 y.o. female chronic low back pain, chronic pain syndrome, kidney stone, bipolar, substance use presenting today for right lower quadrant abdominal pain.  Patient states for the past 3 days she has had right lower quadrant abdominal pain, nausea, and vomiting.  The pain radiates to her right back.  Denies abdominal pain elsewhere.  Does state a couple episodes of diarrhea. Otherwise denying fever, chills, chest pain, shortness of breath, dysuria, hematuria, vaginal bleeding, vaginal discharge.  Prior surgical history of cholecystectomy and hernia repair.  Of note, this is patient's fourth visit in the past 5 weeks for similar abdominal pain complaints symptoms with previously reassuring laboratory workup as well as CT abdomen/pelvis without contrast 6 days ago which was unremarkable.     Physical Exam   Triage Vital Signs: ED Triage Vitals  Encounter Vitals Group     BP 05/08/23 0746 (!) 125/93     Systolic BP Percentile --      Diastolic BP Percentile --      Pulse Rate 05/08/23 0746 90     Resp 05/08/23 0746 18     Temp 05/08/23 0746 98.2 F (36.8 C)     Temp Source 05/08/23 0746 Oral     SpO2 05/08/23 0746 97 %     Weight 05/08/23 0745 165 lb (74.8 kg)     Height 05/08/23 0745 5\' 5"  (1.651 m)     Head Circumference --      Peak Flow --      Pain Score 05/08/23 0745 10     Pain Loc --      Pain Education --      Exclude from Growth Chart --     Most recent vital signs: Vitals:   05/08/23 0746  BP: (!) 125/93  Pulse: 90  Resp: 18  Temp: 98.2 F (36.8 C)  SpO2: 97%   Physical Exam: I have reviewed the vital signs and nursing notes. General: Awake, alert, no acute distress.  Nontoxic appearing. Head:  Atraumatic, normocephalic.   ENT:  EOM intact, PERRL. Oral mucosa  is pink and moist with no lesions. Neck: Neck is supple with full range of motion, No meningeal signs. Cardiovascular:  RRR, No murmurs. Peripheral pulses palpable and equal bilaterally. Respiratory:  Symmetrical chest wall expansion.  No rhonchi, rales, or wheezes.  Good air movement throughout.  No use of accessory muscles.   Musculoskeletal:  No cyanosis or edema. Moving extremities with full ROM Abdomen:  Soft, tenderness to palpation in right lower quadrant, nondistended. Neuro:  GCS 15, moving all four extremities, interacting appropriately. Speech clear. Psych:  Calm, appropriate.   Skin:  Warm, dry, no rash.     ED Results / Procedures / Treatments   Labs (all labs ordered are listed, but only abnormal results are displayed) Labs Reviewed  COMPREHENSIVE METABOLIC PANEL - Abnormal; Notable for the following components:      Result Value   BUN 21 (*)    All other components within normal limits  CBC - Abnormal; Notable for the following components:   WBC 3.9 (*)    All other components within normal limits  URINALYSIS, ROUTINE W REFLEX MICROSCOPIC - Abnormal; Notable for the following components:   Color, Urine YELLOW (*)  APPearance HAZY (*)    All other components within normal limits  LIPASE, BLOOD  POC URINE PREG, ED     EKG    RADIOLOGY Independently interpreted CT abdomen/pelvis with no acute pathology   PROCEDURES:  Critical Care performed: No  Procedures   MEDICATIONS ORDERED IN ED: Medications  lactated ringers bolus 1,000 mL (1,000 mLs Intravenous New Bag/Given 05/08/23 0806)  ondansetron (ZOFRAN) injection 4 mg (4 mg Intravenous Given 05/08/23 0805)  morphine (PF) 4 MG/ML injection 4 mg (4 mg Intravenous Given 05/08/23 0805)  droperidol (INAPSINE) 2.5 MG/ML injection 1.25 mg (1.25 mg Intravenous Given 05/08/23 0909)  morphine (PF) 4 MG/ML injection 4 mg (4 mg Intravenous Given 05/08/23 0913)     IMPRESSION / MDM / ASSESSMENT AND PLAN / ED COURSE  I  reviewed the triage vital signs and the nursing notes.                              Differential diagnosis includes, but is not limited to, viral gastroenteritis, appendicitis, nephrolithiasis, UTI.  Patient's presentation is most consistent with acute illness / injury with system symptoms.  Patient is a 52 year old female presenting today for right lower quadrant abdominal pain associated with vomiting and diarrhea.  Vital signs stable on arrival and patient given 1 L fluids, morphine, and Zofran.  Laboratory workup all reassuring at this time and no evidence of a UTI.  CT abdomen/pelvis was performed which showed no acute pathology.  Patient required additional doses of pain and nausea medication.  After subsequent reassessment, symptoms had resolved and patient feeling much better.  Suspect likely viral GI bug given symptoms today.  Patient otherwise stable for discharge at this time.  Given strict return precautions and told to follow-up with PCP.  The patient is on the cardiac monitor to evaluate for evidence of arrhythmia and/or significant heart rate changes. Clinical Course as of 05/08/23 1021  Mon May 08, 2023  0801 Preg Test, Ur: NEGATIVE [DW]  [id number] CBC(!) No leukocytosis present.  White count similar to recent lab values [DW]  0826 Urinalysis, Routine w reflex microscopic -Urine, Clean Catch(!) No UTI [DW]  0836 Lipase: 27 [DW]  0836 Comprehensive metabolic panel(!) Unremarkable [DW]  [id number] CT ABDOMEN PELVIS WO CONTRAST No acute pathology seen on CT abdomen/pelvis [DW]  1020 Reevaluated patient.  Symptoms improved and resolved at this time.  Will discharge with symptomatic treatment [DW]    Clinical Course User Index [DW] [name], MD     FINAL CLINICAL IMPRESSION(S) / ED DIAGNOSES   Final diagnoses:  Nausea vomiting and diarrhea     Rx / DC Orders   ED Discharge Orders          Ordered    ondansetron (ZOFRAN) 4 MG tablet  Every 8 hours PRN        05/08/23  1021             Note:  This document was prepared using Dragon voice recognition software and may include unintentional dictation errors.   [name], MD 05/08/23 1022

## 2023-05-08 NOTE — Group Note (Deleted)

## 2023-05-08 NOTE — ED Notes (Signed)
Discharge instructions reviewed with patient. Patient questions answered and opportunity for education reviewed. Patient voices understanding of discharge instructions with no further questions. Patient ambulatory with steady gait to lobby.  

## 2023-05-08 NOTE — Discharge Instructions (Signed)
You are seen in the emergency department today for your abdominal pain and nausea with vomiting and diarrhea.  Laboratory workup and CT imaging was all reassuring at this time.  I suspect most likely this is a viral GI bug which should intermittently cause some nausea symptoms going forward.  I have sent some nausea medication to your pharmacy to take as needed.  Please follow-up with your primary care provider for ongoing needs.

## 2023-05-14 ENCOUNTER — Emergency Department
Admission: EM | Admit: 2023-05-14 | Discharge: 2023-05-14 | Disposition: A | Discharge status: Home / Self Care | Payer: 59 | Attending: Emergency Medicine | Admitting: Emergency Medicine

## 2023-05-14 ENCOUNTER — Other Ambulatory Visit: Payer: Self-pay

## 2023-05-14 ENCOUNTER — Encounter: Payer: Self-pay | Admitting: Emergency Medicine

## 2023-05-14 DIAGNOSIS — R1030 Lower abdominal pain, unspecified: Secondary | ICD-10-CM | POA: Diagnosis not present

## 2023-05-14 DIAGNOSIS — R102 Pelvic and perineal pain: Secondary | ICD-10-CM | POA: Insufficient documentation

## 2023-05-14 DIAGNOSIS — Z743 Need for continuous supervision: Secondary | ICD-10-CM | POA: Diagnosis not present

## 2023-05-14 DIAGNOSIS — R1084 Generalized abdominal pain: Secondary | ICD-10-CM | POA: Diagnosis not present

## 2023-05-14 LAB — COMPREHENSIVE METABOLIC PANEL
ALT: 12 U/L (ref 0–44)
AST: 19 U/L (ref 15–41)
Albumin: 4.1 g/dL (ref 3.5–5.0)
Alkaline Phosphatase: 58 U/L (ref 38–126)
Anion gap: 8 (ref 5–15)
BUN: 18 mg/dL (ref 6–20)
CO2: 28 mmol/L (ref 22–32)
Calcium: 9.5 mg/dL (ref 8.9–10.3)
Chloride: 104 mmol/L (ref 98–111)
Creatinine, Ser: 0.76 mg/dL (ref 0.44–1.00)
GFR, Estimated: >60 mL/min (ref >60)
Glucose, Bld: 92 mg/dL (ref 70–99)
Potassium: 3.5 mmol/L (ref 3.5–5.1)
Sodium: 140 mmol/L (ref 135–145)
Total Bilirubin: 0.9 mg/dL (ref 0.3–1.2)
Total Protein: 7.2 g/dL (ref 6.5–8.1)

## 2023-05-14 LAB — URINALYSIS, ROUTINE W REFLEX MICROSCOPIC
Bilirubin Urine: NEGATIVE
Glucose, UA: NEGATIVE mg/dL
Hgb urine dipstick: NEGATIVE
Ketones, ur: NEGATIVE mg/dL
Leukocytes,Ua: NEGATIVE
Nitrite: NEGATIVE
Protein, ur: NEGATIVE mg/dL
Specific Gravity, Urine: 1.023 (ref 1.005–1.030)
pH: 6 (ref 5.0–8.0)

## 2023-05-14 LAB — CBC
HCT: 38.6 % (ref 36.0–46.0)
Hemoglobin: 12.4 g/dL (ref 12.0–15.0)
MCH: 28.2 pg (ref 26.0–34.0)
MCHC: 32.1 g/dL (ref 30.0–36.0)
MCV: 87.7 fL (ref 80.0–100.0)
Platelets: 300 10*3/uL (ref 150–400)
RBC: 4.4 MIL/uL (ref 3.87–5.11)
RDW: 14.8 % (ref 11.5–15.5)
WBC: 3.3 10*3/uL — ABNORMAL LOW (ref 4.0–10.5)
nRBC: 0 % (ref 0.0–0.2)

## 2023-05-14 LAB — LIPASE, BLOOD: Lipase: 21 U/L (ref 11–51)

## 2023-05-14 MED ORDER — TRAMADOL HCL 50 MG PO TABS
50.0000 mg | ORAL_TABLET | Freq: Four times a day (QID) | ORAL | 0 refills | Status: DC | PRN
Start: 2023-05-14 — End: 2023-05-25

## 2023-05-14 NOTE — ED Triage Notes (Signed)
Pt via [facility] from home. Pt c/o RLQ abd pain for the past 2 days, pt was here on 9/9 for the same, states the pain is radiating to her back. States that she believe that it is a clamp from her tubal ligation. Pt is A&Ox4 and NAD

## 2023-05-14 NOTE — ED Provider Notes (Signed)
[facility] Provider Note    Event Date/Time   First MD Initiated Contact with Patient 05/14/23 1039     (approximate)   History   Abdominal Pain   HPI  Karina Anderson is a 53 y.o. female who  presents  again with complaints of lower abdominal pain, she has been seen for this multiple times with multiple CT scans.  She reports no change no worsening, no new symptoms.     Physical Exam   Triage Vital Signs: ED Triage Vitals  Encounter Vitals Group     BP 05/14/23 1021 (!) 119/101     Systolic BP Percentile --      Diastolic BP Percentile --      Pulse Rate 05/14/23 1021 82     Resp 05/14/23 1021 18     Temp 05/14/23 1021 98.1 F (36.7 C)     Temp Source 05/14/23 1021 Oral     SpO2 05/14/23 1021 100 %     Weight 05/14/23 1020 74.8 kg (165 lb)     Height 05/14/23 1020 1.651 m (5\' 5" )     Head Circumference --      Peak Flow --      Pain Score 05/14/23 1020 10     Pain Loc --      Pain Education --      Exclude from Growth Chart --     Most recent vital signs: Vitals:   05/14/23 1021  BP: (!) 119/101  Pulse: 82  Resp: 18  Temp: 98.1 F (36.7 C)  SpO2: 100%     General: Awake, no distress.  CV:  Good peripheral perfusion.  Resp:  Normal effort.  Abd:  No distention.  Soft, nontender Other:     ED Results / Procedures / Treatments   Labs (all labs ordered are listed, but only abnormal results are displayed) Labs Reviewed  CBC - Abnormal; Notable for the following components:      Result Value   WBC 3.3 (*)    All other components within normal limits  URINALYSIS, ROUTINE W REFLEX MICROSCOPIC - Abnormal; Notable for the following components:   Color, Urine YELLOW (*)    APPearance HAZY (*)    All other components within normal limits  LIPASE, BLOOD  COMPREHENSIVE METABOLIC PANEL  POC URINE PREG, ED     EKG   RADIOLOGY     PROCEDURES:  Critical Care performed:   Procedures   MEDICATIONS ORDERED IN  ED: Medications - No data to display   IMPRESSION / MDM / ASSESSMENT AND PLAN / ED COURSE  I reviewed the triage vital signs and the nursing notes. Patient's presentation is most consistent with exacerbation of chronic illness.  Patient presents with similar discomfort as before, no indication for repeat emergency department workup, multiple CT scans have been unrevealing.  Lab work has been unremarkable.  Lab work today is unchanged, no evidence of urinary tract infection.  Will refer to gynecology for further workup she agrees with this plan        FINAL CLINICAL IMPRESSION(S) / ED DIAGNOSES   Final diagnoses:  Pelvic pain in female     Rx / DC Orders   ED Discharge Orders          Ordered    traMADol (ULTRAM) 50 MG tablet  Every 6 hours PRN        05/14/23 1110  Note:  This document was prepared using Dragon voice recognition software and may include unintentional dictation errors.   Jene Every, MD 05/14/23 [phone]

## 2023-05-25 ENCOUNTER — Encounter: Payer: Self-pay | Admitting: Licensed Practical Nurse

## 2023-05-25 ENCOUNTER — Ambulatory Visit (INDEPENDENT_AMBULATORY_CARE_PROVIDER_SITE_OTHER): Payer: 59 | Admitting: Licensed Practical Nurse

## 2023-05-25 VITALS — BP 126/98 | HR 98 | Resp 16 | Ht 65.0 in | Wt 143.9 lb

## 2023-05-25 DIAGNOSIS — Z1231 Encounter for screening mammogram for malignant neoplasm of breast: Secondary | ICD-10-CM

## 2023-05-25 DIAGNOSIS — Z1211 Encounter for screening for malignant neoplasm of colon: Secondary | ICD-10-CM

## 2023-05-25 DIAGNOSIS — R1030 Lower abdominal pain, unspecified: Secondary | ICD-10-CM | POA: Diagnosis not present

## 2023-05-25 MED ORDER — OXYCODONE HCL 5 MG PO TABS
5.0000 mg | ORAL_TABLET | Freq: Four times a day (QID) | ORAL | 0 refills | Status: AC | PRN
Start: 2023-05-25 — End: 2023-05-27

## 2023-05-25 NOTE — Progress Notes (Unsigned)
Gynecology Pelvic Pain Evaluation   Chief Complaint: lower abdominal pain    History of Present Illness:   Patient is a 52 y.o. No obstetric history on file. who LMP was No LMP recorded., presents today for a problem visit.  She complains of  pain in her lower right side x 2 months . The pain is sharp and "pinches", the pain radiates to her back, the pain occurs daily and  is constant. The pain is worse when her bladder is full. She has tried heat, ice, and Tylenol, they do not help. She is not able to take NSAIDS. She has had some nausea for the last 3 weeks. She tends to be constipated, her last BM was just before she came today. She has had a hernia repair and cholecystectomy, she does have pain near her laparoscopic site.  She has appointment with a new PCP Oct 31.   Karina Anderson wonders if she has gone through menopause,     PMHx: She  has a past medical history of Anemia, Arthritis, Bipolar disorder (HCC), Chronic lower back pain, Chronic pain disorder, GERD (gastroesophageal reflux disease), History of kidney stones, History of psychosis, History of suicide attempt, Hydradenitis, Involuntary commitment, Marijuana use, Seizures (HCC), Syphilis, Tobacco use, and Ventral hernia. Also,  has a past surgical history that includes Hydradenitis excision; Tubal ligation; Dilation and curettage of uterus; Tumor removal (07/2020); Cholecystectomy (04/15/2022); Ventral hernia repair (N/A, 10/11/2022); and Insertion of mesh (10/11/2022)., family history includes Anxiety disorder in her maternal grandmother; Diabetes in her mother; Eczema in her daughter; Heart attack in her father; Hypertension in her brother, brother, brother, brother, and brother; Seizures in her mother.,  reports that she has been smoking cigarettes. She has a 20 pack-year smoking history. She has never used smokeless tobacco. She reports current alcohol use. She reports that she does not currently use drugs after having used the following  drugs: Marijuana.  She has a current medication list which includes the following prescription(s): ondansetron, oxycodone, phenytoin, polyethylene glycol, potassium chloride sa, and sucralfate. Also, is allergic to citrus, sulfamethoxazole-trimethoprim, butorphanol, contrast media [iodinated contrast media], ibuprofen, ketorolac, lorazepam, naproxen, prochlorperazine, and nsaids.  Review of Systems  Constitutional:  Positive for malaise/fatigue.  HENT: Negative.    Eyes: Negative.   Respiratory: Negative.    Cardiovascular: Negative.   Gastrointestinal:  Positive for constipation, nausea and vomiting.  Genitourinary:  Positive for frequency.  Skin: Negative.   Neurological: Negative.   Endo/Heme/Allergies:  Bruises/bleeds easily.       Hot flashes Sneezing   Psychiatric/Behavioral: Negative.      Objective: BP (!) 126/98   Pulse 98   Resp 16   Ht 5\' 5"  (1.651 m)   Wt 143 lb 14.4 oz (65.3 kg)   BMI 23.95 kg/m  Physical Exam Constitutional:      Appearance: Normal appearance.  Pulmonary:     Effort: Pulmonary effort is normal.  Abdominal:     General: Abdomen is flat.     Tenderness: There is abdominal tenderness.     Comments: Tenderness noted midline along lower abdomen and in Right groin. No obvious hernia. No masses palpated.   Neurological:     General: No focal deficit present.     Mental Status: She is alert.  Skin:    General: Skin is warm.  Psychiatric:        Mood and Affect: Mood normal.       Assessment: 52 y.o. No obstetric history on file. with lower  abdominal pain  -reviewed with pt I am not sure of the cause of her pain, I suspect there could be a hernia, I need to review her history with one of the MD's, she may need to see another provider. pt verbalizes understanding-aware she may be referred somewhere else.  -reviewed pt's symptoms with Dr Logan Bores, pain probably not related to tubal ligation clip. Abdominal hernia or interstitial cystitis are  possible.  -reviewed pain near her laparoscopic area could be related to adhesion, rec castor oil packs.  -reviewed she is most likely in perimenopause as she has not had a cycle in 2 months, she is at an age where she can expect her cycles to become irregular or stop all together.  -Called pt multiple times to review pain medications (has been prescribed  Percocet  and Tramadol in the last few months, also to review recommendation to see general surgeon. Unable to connect with pt.   1. Encounter for screening mammogram for malignant neoplasm of breast  - MM 3D SCREENING MAMMOGRAM BILATERAL BREAST  2. Colon cancer screening  - Ambulatory referral to Gastroenterology  Problem List Items Addressed This Visit   None Visit Diagnoses     Lower abdominal pain    -  Primary   Relevant Medications   oxyCODONE (ROXICODONE) 5 MG immediate release tablet   Other Relevant Orders   Ambulatory referral to General Surgery   Encounter for screening mammogram for malignant neoplasm of breast       Relevant Orders   MM 3D SCREENING MAMMOGRAM BILATERAL BREAST   Colon cancer screening       Relevant Orders   Ambulatory referral to Gastroenterology       Carie Caddy, CNM  [facility]  05/25/23  4:59 PM

## 2023-06-07 ENCOUNTER — Encounter: Payer: Self-pay | Admitting: *Deleted

## 2023-06-14 ENCOUNTER — Ambulatory Visit (INDEPENDENT_AMBULATORY_CARE_PROVIDER_SITE_OTHER): Payer: 59 | Admitting: Surgery

## 2023-06-14 ENCOUNTER — Encounter: Payer: Self-pay | Admitting: Surgery

## 2023-06-14 ENCOUNTER — Telehealth: Payer: Self-pay

## 2023-06-14 VITALS — BP 154/84 | HR 94 | Temp 98.4°F | Ht 65.0 in | Wt 142.8 lb

## 2023-06-14 DIAGNOSIS — M25559 Pain in unspecified hip: Secondary | ICD-10-CM

## 2023-06-14 DIAGNOSIS — G8929 Other chronic pain: Secondary | ICD-10-CM | POA: Diagnosis not present

## 2023-06-14 DIAGNOSIS — R109 Unspecified abdominal pain: Secondary | ICD-10-CM | POA: Diagnosis not present

## 2023-06-14 DIAGNOSIS — Z1211 Encounter for screening for malignant neoplasm of colon: Secondary | ICD-10-CM | POA: Diagnosis not present

## 2023-06-14 NOTE — Patient Instructions (Signed)
Acute Pain, Adult Acute pain is a type of sudden pain that may last for just a few days or for as long as three months. It is often related to an illness, injury, or a medical procedure. Acute pain may be mild, moderate, or severe. Pain can make it hard for you to do your daily activities. It can cause anxiety and lead to other problems if it is not treated. Treatment may not take all the pain away, but it may lessen the pain so you can move around and tolerate it. Pain is best treated with medicines and other therapies such as distraction, meditation, oils from plants (aromatherapy), heat, and ice. Treatment depends on the cause of the pain and how severe it is. Acute pain usually goes away once your injury has healed or you are no longer ill. Follow these instructions at home: Medicines  Take over-the-counter and prescription medicines only as told by your health care provider. Take the lowest dose of medicine for the shortest amount of time needed to relieve the pain. If you are taking prescription pain medicine: Do not stop taking the medicine suddenly. Talk to your health care provider about how and when to stop taking prescription medicine. Do not take more pills than told by your health care provider even if your pain is severe. Do not take other over-the-counter pain medicines in addition to prescription pain medicine unless told by your health care provider. Keep your medicine in a safe place, away from children or anyone who could use it in a way that it was not prescribed. Ask your health care provider if the medicine prescribed to you requires you to avoid driving or using machinery. Managing pain, stiffness, and swelling     If told, put ice on the affected area. Put ice in a plastic bag. Place a towel between your skin and the bag. Leave the ice on for 20 minutes, 2-3 times a day. If told, apply heat to the affected area as often as told by your health care provider. Use the heat  source that your health care provider recommends, such as a moist heat pack or a heating pad. Place a towel between your skin and the heat source. Leave the heat on for 20-30 minutes. If your skin turns bright red, remove the ice or heat right away to prevent skin damage. The risk of damage is higher if you cannot feel pain, heat, or cold. Managing constipation Your medicines may cause constipation. To prevent or treat constipation, you may need to: Drink enough fluid to keep your urine pale yellow. Take over-the-counter or prescription medicines. Eat foods that are high in fiber, such as beans, whole grains, and fresh fruits and vegetables. Limit foods that are high in fat and processed sugars, such as fried or sweet foods. Activity Rest as told by your health care provider. Return to your normal activities as told by your health care provider. Ask your health care provider what activities are safe for you. Ask your health care provider if doing physical therapy exercises to improve movement and strength can help you manage your pain. General instructions Check your pain level as told by your health care provider. Ask your health care provider if distraction, relaxation, or aromatherapy can help you manage your pain. Keep all follow-up visits. Your health care provider will monitor your pain level. Contact a health care provider if: Your pain is not controlled by medicine. Your pain does not improve or gets worse. You have  side effects from pain medicines. Get help right away if: You have severe pain. You have trouble breathing. You faint, or another person sees you faint. You have chest pain or pressure that lasts for more than a few minutes, or if you have other symptoms along with chest pain, including: Pain or discomfort in one or both arms, your back, neck, jaw, or stomach. Shortness of breath. A cold sweat. Nausea. Feeling light-headed. These symptoms may be an emergency. Get  help right away. Call 911. Do not wait to see if the symptoms will go away. Do not drive yourself to the hospital. This information is not intended to replace advice given to you by your health care provider. Make sure you discuss any questions you have with your health care provider. Document Revised: 03/09/2022 Document Reviewed: 03/09/2022 Elsevier Patient Education  2024 [employer].

## 2023-06-15 ENCOUNTER — Telehealth: Payer: Self-pay

## 2023-06-15 ENCOUNTER — Other Ambulatory Visit: Payer: Self-pay

## 2023-06-15 DIAGNOSIS — Z1211 Encounter for screening for malignant neoplasm of colon: Secondary | ICD-10-CM

## 2023-06-15 MED ORDER — NA SULFATE-K SULFATE-MG SULF 17.5-3.13-1.6 GM/177ML PO SOLN
1.0000 | Freq: Once | ORAL | 0 refills | Status: AC
Start: 2023-06-15 — End: 2023-06-15

## 2023-06-15 NOTE — Telephone Encounter (Signed)
Gastroenterology Pre-Procedure Review  Request Date: 07/18/23 Requesting Physician: Dr. Allegra Lai  PATIENT REVIEW QUESTIONS: The patient responded to the following health history questions as indicated:    1. Are you having any GI issues? no 2. Do you have a personal history of Polyps? no 3. Do you have a family history of Colon Cancer or Polyps? no 4. Diabetes Mellitus? no 5. Joint replacements in the past 12 months?no 6. Major health problems in the past 3 months? 05/08/23 ER visit for N/V/D, 05/02/23 ER Visit Lower Abd Pain, 04/17/23 ER Visit LLQ Abd Pain 7. Any artificial heart valves, MVP, or defibrillator?no    MEDICATIONS & ALLERGIES:    Patient reports the following regarding taking any anticoagulation/antiplatelet therapy:   Plavix, Coumadin, Eliquis, Xarelto, Lovenox, Pradaxa, Brilinta, or Effient? no Aspirin? no  Patient confirms/reports the following medications:  Current Outpatient Medications  Medication Sig Dispense Refill   ondansetron (ZOFRAN) 4 MG tablet Take 1 tablet (4 mg total) by mouth every 8 (eight) hours as needed. 15 tablet 0   phenytoin (DILANTIN) 100 MG ER capsule TAKE 1 CAPSULE(100 MG) BY MOUTH THREE TIMES DAILY (Patient taking differently: 100 mg 3 (three) times daily.) 90 capsule 2   polyethylene glycol (MIRALAX / GLYCOLAX) 17 g packet Take 17 g by mouth daily. Mix one tablespoon with 8oz of your favorite juice or water every day until you are having soft formed stools. Then start taking once daily if you didn't have a stool the day before. (Patient taking differently: Take 17 g by mouth daily as needed. Mix one tablespoon with 8oz of your favorite juice or water every day until you are having soft formed stools. Then start taking once daily if you didn't have a stool the day before.) 30 each 0   potassium chloride SA (KLOR-CON M) 20 MEQ tablet Take 2 tablets (40 mEq) today, then 1 tablet (20 mEq) BID x 3 days, then 1 tablet (20 mEq) on the day of surgery. Follow  up with PCP for lab recheck. 9 tablet 0   sucralfate (CARAFATE) 1 g tablet Take 1 tablet (1 g total) by mouth 4 (four) times daily -  with meals and at bedtime for 14 days. 56 tablet 0   No current facility-administered medications for this visit.    Patient confirms/reports the following allergies:  Allergies  Allergen Reactions   Citrus Hives   Sulfamethoxazole-Trimethoprim Hives   Butorphanol Hives   Contrast Media [Iodinated Contrast Media] Nausea And Vomiting   Ibuprofen    Ketorolac    Lorazepam Hives   Naproxen    Prochlorperazine Hives   Nsaids Rash    No orders of the defined types were placed in this encounter.   AUTHORIZATION INFORMATION Primary Insurance: 1D#: Group #:  Secondary Insurance: 1D#: Group #:  SCHEDULE INFORMATION: Date: 07/18/23 Time: Location: [facility]

## 2023-06-15 NOTE — Telephone Encounter (Signed)
Patient following up on pain medication. Advised rx sent in on 05/25/23. She reports she did not pick it up. She will contact pharmacy to have it filled. Also states she saw surgeon yesterday: Assessment/Plan: 52 year old female with chronic abdominal pain.  Seems to be more to the right side than the paramedical area.  The scans without evidence of an acute intra-abdominal pathology.  On exam there is no evidence of hernias or infection.  Discussed with her in detail.  She does have some clips from prior tubal ligation.  I will like to refer her to GYN see if this pain is possibly pelvic etiology.

## 2023-06-15 NOTE — Progress Notes (Signed)
Outpatient Surgical Follow Up  06/15/2023  Karina Anderson is an 52 y.o. female.   Chief Complaint  Patient presents with   Follow-up    Lower abdominal pain    HPI: Karina Anderson is 8 months out from open ventral hernia repair with mesh.  She does have chronic pain issues.  She has had multiple CT scans that I have personally reviewed even after the surgery.  There is no evidence of complications related to the surgery.  There is clips related to prior tubal ligation.  There is no intra-abdominal abscess.  Patient reports that the pain is intermittent moderate to severe.  Has had multiple visits to the ER.  Since her ventral hernia surgery she is has had 3 CT scans.  The scans did not reveal any evidence of complications related to surgery  Past Medical History:  Diagnosis Date   Anemia    Arthritis    Bipolar disorder (HCC)    Chronic lower back pain    Chronic pain disorder    GERD (gastroesophageal reflux disease)    History of kidney stones    History of psychosis    History of suicide attempt    Hydradenitis    Involuntary commitment    Marijuana use    Seizures (HCC)    last was 2018   Syphilis    Tobacco use    Ventral hernia     Past Surgical History:  Procedure Laterality Date   CHOLECYSTECTOMY  04/15/2022   DILATION AND CURETTAGE OF UTERUS     3 miscarriages   HYDRADENITIS EXCISION     R underarm   INSERTION OF MESH  10/11/2022   Procedure: INSERTION OF MESH;  Surgeon: Leafy Ro, MD;  Location: [facility] ORS;  Service: General;;   TUBAL LIGATION     TUMOR REMOVAL  07/2020   index finger on L hand   VENTRAL HERNIA REPAIR N/A 10/11/2022   Procedure: HERNIA REPAIR VENTRAL ADULT, open;  Surgeon: Leafy Ro, MD;  Location: [facility] ORS;  Service: General;  Laterality: N/A;    Family History  Problem Relation Age of Onset   Seizures Mother    Diabetes Mother    Heart attack Father    Hypertension Brother    Eczema Daughter    Anxiety disorder Maternal Grandmother     Hypertension Brother    Hypertension Brother    Hypertension Brother    Hypertension Brother     Social History:  reports that she has been smoking cigarettes. She has a 20 pack-year smoking history. She has never used smokeless tobacco. She reports current alcohol use. She reports that she does not currently use drugs after having used the following drugs: Marijuana.  Allergies:  Allergies  Allergen Reactions   Citrus Hives   Sulfamethoxazole-Trimethoprim Hives   Butorphanol Hives   Contrast Media [Iodinated Contrast Media] Nausea And Vomiting   Ibuprofen    Ketorolac    Lorazepam Hives   Naproxen    Prochlorperazine Hives   Nsaids Rash    Medications reviewed.    ROS Full ROS performed and is otherwise negative other than what is stated in HPI   BP (!) 154/84 (BP Location: Left Arm, Patient Position: Sitting, Cuff Size: Small)   Pulse 94   Temp 98.4 F (36.9 C) (Oral)   Ht 5\' 5"  (1.651 m)   Wt 142 lb 12.8 oz (64.8 kg)   SpO2 99%   BMI 23.76 kg/m   Physical Exam  Assessment/Plan: 52 year old female with chronic abdominal pain.  Seems to be more to the right side than the paramedical area.  The scans without evidence of an acute intra-abdominal pathology.  On exam there is no evidence of hernias or infection.  Discussed with her in detail.  She does have some clips from prior tubal ligation.  I will like to refer her to GYN see if this pain is possibly pelvic etiology.  I do also think that she will need a colonoscopy and being evaluated by GI since she seems to have more of a functional GI issue. Was asking for some medications for pain and given that she is so far out I encouraged her to seek help from primary care provider and also wanted her to touch base with pain medicine. The spent 30 minutes in this encounter including personally reviewing imaging studies, medical records, placing orders, counseling the patient and performing documentation   Sterling Big,  MD [facility] General Surgeon

## 2023-06-16 ENCOUNTER — Other Ambulatory Visit: Payer: Self-pay | Admitting: Licensed Practical Nurse

## 2023-06-16 DIAGNOSIS — G8929 Other chronic pain: Secondary | ICD-10-CM

## 2023-06-16 DIAGNOSIS — R1030 Lower abdominal pain, unspecified: Secondary | ICD-10-CM

## 2023-06-16 MED ORDER — OXYCODONE HCL 5 MG PO TABS
5.0000 mg | ORAL_TABLET | Freq: Four times a day (QID) | ORAL | 0 refills | Status: AC | PRN
Start: 2023-06-16 — End: 2023-06-19

## 2023-06-16 NOTE — Progress Notes (Signed)
Pt called requesting pain medication. Spoke with [employer].  Oxycodone 5mg  q 6 hours PRN x 3 days sent  Pt understands this prescription will not be reordered and that a referral to the pain clinic was placed.  Pt to fu with Dr Logan Bores d/t concern that tubal clips are the source of her pain. Pt would like them removed Carie Caddy, CNM  [facility]  06/16/23  8:42 AM

## 2023-06-16 NOTE — Progress Notes (Signed)
Pt seen in ED, this office and general surgeon for abdominal pain, called office requesting pain medication. Script was sent 9/26. Referral to pain clinic made. Carie Caddy, CNM  [facility]  06/16/23  8:32 AM

## 2023-06-20 ENCOUNTER — Encounter: Payer: Self-pay | Admitting: Obstetrics and Gynecology

## 2023-06-21 ENCOUNTER — Emergency Department
Admission: EM | Admit: 2023-06-21 | Discharge: 2023-06-22 | Disposition: A | Discharge status: Home / Self Care | Payer: 59 | Attending: Emergency Medicine | Admitting: Emergency Medicine

## 2023-06-21 ENCOUNTER — Other Ambulatory Visit: Payer: Self-pay

## 2023-06-21 ENCOUNTER — Emergency Department: Payer: 59

## 2023-06-21 DIAGNOSIS — R110 Nausea: Secondary | ICD-10-CM | POA: Insufficient documentation

## 2023-06-21 DIAGNOSIS — E876 Hypokalemia: Secondary | ICD-10-CM | POA: Diagnosis not present

## 2023-06-21 DIAGNOSIS — R109 Unspecified abdominal pain: Secondary | ICD-10-CM | POA: Diagnosis not present

## 2023-06-21 DIAGNOSIS — R1031 Right lower quadrant pain: Secondary | ICD-10-CM | POA: Diagnosis not present

## 2023-06-21 DIAGNOSIS — R350 Frequency of micturition: Secondary | ICD-10-CM | POA: Insufficient documentation

## 2023-06-21 DIAGNOSIS — Z9049 Acquired absence of other specified parts of digestive tract: Secondary | ICD-10-CM | POA: Diagnosis not present

## 2023-06-21 LAB — URINALYSIS, ROUTINE W REFLEX MICROSCOPIC
Bilirubin Urine: NEGATIVE
Glucose, UA: NEGATIVE mg/dL
Hgb urine dipstick: NEGATIVE
Ketones, ur: NEGATIVE mg/dL
Leukocytes,Ua: NEGATIVE
Nitrite: NEGATIVE
Protein, ur: NEGATIVE mg/dL
Specific Gravity, Urine: 1.023 (ref 1.005–1.030)
pH: 6 (ref 5.0–8.0)

## 2023-06-21 LAB — BASIC METABOLIC PANEL
Anion gap: 5 (ref 5–15)
BUN: 21 mg/dL — ABNORMAL HIGH (ref 6–20)
CO2: 28 mmol/L (ref 22–32)
Calcium: 9.3 mg/dL (ref 8.9–10.3)
Chloride: 106 mmol/L (ref 98–111)
Creatinine, Ser: 0.62 mg/dL (ref 0.44–1.00)
GFR, Estimated: >60 mL/min (ref >60)
Glucose, Bld: 157 mg/dL — ABNORMAL HIGH (ref 70–99)
Potassium: 3.2 mmol/L — ABNORMAL LOW (ref 3.5–5.1)
Sodium: 139 mmol/L (ref 135–145)

## 2023-06-21 LAB — CBC WITH DIFFERENTIAL/PLATELET
Abs Immature Granulocytes: 0.02 10*3/uL (ref 0.00–0.07)
Basophils Absolute: 0 10*3/uL (ref 0.0–0.1)
Basophils Relative: 0 %
Eosinophils Absolute: 0.1 10*3/uL (ref 0.0–0.5)
Eosinophils Relative: 2 %
HCT: 37.6 % (ref 36.0–46.0)
Hemoglobin: 12.2 g/dL (ref 12.0–15.0)
Immature Granulocytes: 0 %
Lymphocytes Relative: 27 %
Lymphs Abs: 1.6 10*3/uL (ref 0.7–4.0)
MCH: 28.4 pg (ref 26.0–34.0)
MCHC: 32.4 g/dL (ref 30.0–36.0)
MCV: 87.4 fL (ref 80.0–100.0)
Monocytes Absolute: 0.5 10*3/uL (ref 0.1–1.0)
Monocytes Relative: 7 %
Neutro Abs: 3.9 10*3/uL (ref 1.7–7.7)
Neutrophils Relative %: 64 %
Platelets: 308 10*3/uL (ref 150–400)
RBC: 4.3 MIL/uL (ref 3.87–5.11)
RDW: 14.5 % (ref 11.5–15.5)
WBC: 6.2 10*3/uL (ref 4.0–10.5)
nRBC: 0 % (ref 0.0–0.2)

## 2023-06-21 MED ORDER — PREDNISONE 10 MG PO TABS
40.0000 mg | ORAL_TABLET | Freq: Every day | ORAL | 0 refills | Status: AC
Start: 2023-06-21 — End: 2023-06-26

## 2023-06-21 MED ORDER — OXYCODONE HCL 5 MG PO TABS
5.0000 mg | ORAL_TABLET | Freq: Once | ORAL | Status: AC
  Administered 2023-06-21: 5 mg via ORAL
  Filled 2023-06-21: qty 1

## 2023-06-21 MED ORDER — ONDANSETRON HCL 4 MG PO TABS: 4.0000 mg | ORAL_TABLET | Freq: Every day | ORAL | 0 refills | Status: DC | PRN

## 2023-06-21 MED ORDER — ONDANSETRON 4 MG PO TBDP
4.0000 mg | ORAL_TABLET | Freq: Once | ORAL | Status: AC
  Administered 2023-06-21: 4 mg via ORAL
  Filled 2023-06-21: qty 1

## 2023-06-21 MED ORDER — OXYCODONE-ACETAMINOPHEN 5-325 MG PO TABS
1.0000 | ORAL_TABLET | Freq: Once | ORAL | Status: AC
  Administered 2023-06-21: 1 via ORAL
  Filled 2023-06-21: qty 1

## 2023-06-21 NOTE — ED Triage Notes (Signed)
Pt states developed left side flank pain 2 weeks ago and was seen here for it then, nothing was found. Pt states pain has continued and gotten worse and she developed nausea. Pt denies urinary symptoms.

## 2023-06-21 NOTE — Discharge Instructions (Signed)
Laboratory workup and CT imaging was reassuring today.  Please follow-up with your gynecology team and GI team as planned for continued evaluation of these chronic pain symptoms.

## 2023-06-21 NOTE — ED Provider Notes (Signed)
[facility] Provider Note    Event Date/Time   First MD Initiated Contact with Patient 06/21/23 2259     (approximate)   History   Flank Pain   HPI Karina Anderson is a 52 y.o. female with history of chronic low back pain, chronic pain syndrome, bipolar disorder presenting today for flank pain.  Patient has been seen frequently in the emergency department for similar symptoms.  She notes ongoing symptoms today with no change in her left flank.  Intermittent nausea but no vomiting.  Denies dysuria, vaginal bleeding, vaginal discharge.  Denies fevers, chest pain, shortness of breath.  Reviewed prior ED notes for similar visits.  Patient does have outpatient follow-up with gynecology and GI in the next month.     Physical Exam   Triage Vital Signs: ED Triage Vitals  Encounter Vitals Group     BP 06/21/23 1934 (!) 154/101     Systolic BP Percentile --      Diastolic BP Percentile --      Pulse Rate 06/21/23 1934 (!) 106     Resp 06/21/23 1934 18     Temp 06/21/23 1934 98.3 F (36.8 C)     Temp Source 06/21/23 1934 Oral     SpO2 06/21/23 1934 99 %     Weight 06/21/23 1933 165 lb (74.8 kg)     Height 06/21/23 1933 5\' 5"  (1.651 m)     Head Circumference --      Peak Flow --      Pain Score 06/21/23 1933 10     Pain Loc --      Pain Education --      Exclude from Growth Chart --     Most recent vital signs: Vitals:   06/21/23 1934 06/21/23 2300  BP: (!) 154/101 (!) 163/99  Pulse: (!) 106 92  Resp: 18 18  Temp: 98.3 F (36.8 C)   SpO2: 99% 100%   Physical Exam: I have reviewed the vital signs and nursing notes. General: Awake, alert, no acute distress.  Nontoxic appearing. Head:  Atraumatic, normocephalic.   ENT:  EOM intact, PERRL. Oral mucosa is pink and moist with no lesions. Neck: Neck is supple with full range of motion, No meningeal signs. Cardiovascular:  RRR, No murmurs. Peripheral pulses palpable and equal bilaterally. Respiratory:   Symmetrical chest wall expansion.  No rhonchi, rales, or wheezes.  Good air movement throughout.  No use of accessory muscles.   Musculoskeletal:  No cyanosis or edema. Moving extremities with full ROM Abdomen:  Soft, nontender, nondistended. Neuro:  GCS 15, moving all four extremities, interacting appropriately. Speech clear. Psych:  Calm, appropriate.   Skin:  Warm, dry, no rash.    ED Results / Procedures / Treatments   Labs (all labs ordered are listed, but only abnormal results are displayed) Labs Reviewed  BASIC METABOLIC PANEL - Abnormal; Notable for the following components:      Result Value   Potassium 3.2 (*)    Glucose, Bld 157 (*)    BUN 21 (*)    All other components within normal limits  URINALYSIS, ROUTINE W REFLEX MICROSCOPIC - Abnormal; Notable for the following components:   Color, Urine YELLOW (*)    APPearance HAZY (*)    All other components within normal limits  CBC WITH DIFFERENTIAL/PLATELET     EKG    RADIOLOGY Independently interpreted CT read and agree with radiologist   PROCEDURES:  Critical Care performed: No  Procedures   MEDICATIONS ORDERED IN ED: Medications  oxyCODONE (Oxy IR/ROXICODONE) immediate release tablet 5 mg (has no administration in time range)  ondansetron (ZOFRAN-ODT) disintegrating tablet 4 mg (has no administration in time range)  oxyCODONE-acetaminophen (PERCOCET/ROXICET) 5-325 MG per tablet 1 tablet (1 tablet Oral Given 06/21/23 2150)     IMPRESSION / MDM / ASSESSMENT AND PLAN / ED COURSE  I reviewed the triage vital signs and the nursing notes.                              Differential diagnosis includes, but is not limited to,  Nephrolithiasis, pyelonephritis, acute cystitis, constipation, diverticulitis, SBO  Patient's presentation is most consistent with exacerbation of chronic illness.  Patient is a 52 year old female presenting today with left flank pain that is similar to multiple prior ED visits.   Vital signs are stable and physical exam unremarkable.  Laboratory workup unremarkable aside from slight hypokalemia today.  Improvement in pain symptoms with Percocet.  CT ordered from triage with no acute pathology.  Patient otherwise stable with similar presentations to the past and largely unremarkable workup.  Do not think she needs further intervention at this time.  Agree with outpatient follow-up with gynecology and GI which she is already established.  Sent out with nausea medication and short burst of steroids to see if this is beneficial.  Patient discharged and given strict return precautions.  The patient is on the cardiac monitor to evaluate for evidence of arrhythmia and/or significant heart rate changes. Clinical Course as of 06/21/23 2338  Wed Jun 21, 2023  2311 CBC with Differential Unremarkable [DW]  2312 Basic metabolic panel(!) Mild hypokalemia otherwise unremarkable [DW]  2312 Urinalysis, Routine w reflex microscopic -Urine, Clean Catch(!) No UTI [DW]  2312 CT Renal Stone Study Simple cyst otherwise unremarkable [DW]    Clinical Course User Index [DW] [name], MD     FINAL CLINICAL IMPRESSION(S) / ED DIAGNOSES   Final diagnoses:  Left flank pain     Rx / DC Orders   ED Discharge Orders          Ordered    ondansetron (ZOFRAN) 4 MG tablet  Daily PRN        06/21/23 2333    predniSONE (DELTASONE) 10 MG tablet  Daily        06/21/23 2333             Note:  This document was prepared using Dragon voice recognition software and may include unintentional dictation errors.   [name], MD 06/21/23 [phone]

## 2023-06-29 ENCOUNTER — Ambulatory Visit (INDEPENDENT_AMBULATORY_CARE_PROVIDER_SITE_OTHER): Payer: 59 | Admitting: Internal Medicine

## 2023-06-29 ENCOUNTER — Encounter: Payer: Self-pay | Admitting: Internal Medicine

## 2023-06-29 VITALS — BP 138/72 | Ht 65.0 in | Wt 146.0 lb

## 2023-06-29 DIAGNOSIS — K219 Gastro-esophageal reflux disease without esophagitis: Secondary | ICD-10-CM | POA: Insufficient documentation

## 2023-06-29 DIAGNOSIS — E782 Mixed hyperlipidemia: Secondary | ICD-10-CM | POA: Insufficient documentation

## 2023-06-29 DIAGNOSIS — G8929 Other chronic pain: Secondary | ICD-10-CM | POA: Diagnosis not present

## 2023-06-29 DIAGNOSIS — R739 Hyperglycemia, unspecified: Secondary | ICD-10-CM

## 2023-06-29 DIAGNOSIS — F312 Bipolar disorder, current episode manic severe with psychotic features: Secondary | ICD-10-CM | POA: Diagnosis not present

## 2023-06-29 DIAGNOSIS — M545 Low back pain, unspecified: Secondary | ICD-10-CM

## 2023-06-29 DIAGNOSIS — R569 Unspecified convulsions: Secondary | ICD-10-CM | POA: Diagnosis not present

## 2023-06-29 NOTE — Assessment & Plan Note (Signed)
Not medicated She will continue to meet with her therapist We will monitor

## 2023-06-29 NOTE — Progress Notes (Signed)
HPI  Patient presents to clinic today to establish care and for management of the conditions listed below.  Bipolar disorder: Chronic, but she is not currently taking any medications for this. She is seeing a therapist but not psychiatrist. She denies SI/HI.  Chronic pain syndrome: Mainly in her back. She is not taking any medication for this. She uses heating pads and ice with minimal relief of symptoms. She would like to get back on her percocet.  She has seen pain management in the past.  There is no imaging dedicated to the lumbar spine on file.  GERD: Triggered by hot sauce.  She drinks milk with good relief of symptoms.  There is no upper GI on file.  History of seizures: She denies recent seizure.  She is no longer taking dilantin.  She does not follow with neurology.  HLD: Her last LDL was 158, triglycerides 228, 12/2020.  She is not taking any cholesterol-lowering medication at this time.  She does not consume a low-fat diet.  Past Medical History:  Diagnosis Date   Anemia    Arthritis    Bipolar disorder (HCC)    Chronic lower back pain    Chronic pain disorder    GERD (gastroesophageal reflux disease)    History of kidney stones    History of psychosis    History of suicide attempt    Hydradenitis    Involuntary commitment    Marijuana use    Seizures (HCC)    last was 2018   Syphilis    Tobacco use    Ventral hernia     Current Outpatient Medications  Medication Sig Dispense Refill   ondansetron (ZOFRAN) 4 MG tablet Take 1 tablet (4 mg total) by mouth every 8 (eight) hours as needed. 15 tablet 0   ondansetron (ZOFRAN) 4 MG tablet Take 1 tablet (4 mg total) by mouth daily as needed. 15 tablet 0   phenytoin (DILANTIN) 100 MG ER capsule TAKE 1 CAPSULE(100 MG) BY MOUTH THREE TIMES DAILY (Patient taking differently: 100 mg 3 (three) times daily.) 90 capsule 2   polyethylene glycol (MIRALAX / GLYCOLAX) 17 g packet Take 17 g by mouth daily. Mix one tablespoon with 8oz of  your favorite juice or water every day until you are having soft formed stools. Then start taking once daily if you didn't have a stool the day before. (Patient taking differently: Take 17 g by mouth daily as needed. Mix one tablespoon with 8oz of your favorite juice or water every day until you are having soft formed stools. Then start taking once daily if you didn't have a stool the day before.) 30 each 0   potassium chloride SA (KLOR-CON M) 20 MEQ tablet Take 2 tablets (40 mEq) today, then 1 tablet (20 mEq) BID x 3 days, then 1 tablet (20 mEq) on the day of surgery. Follow up with PCP for lab recheck. 9 tablet 0   sucralfate (CARAFATE) 1 g tablet Take 1 tablet (1 g total) by mouth 4 (four) times daily -  with meals and at bedtime for 14 days. 56 tablet 0   No current facility-administered medications for this visit.    Allergies  Allergen Reactions   Citrus Hives   Sulfamethoxazole-Trimethoprim Hives   Butorphanol Hives   Contrast Media [Iodinated Contrast Media] Nausea And Vomiting   Ibuprofen    Ketorolac    Lorazepam Hives   Naproxen    Prochlorperazine Hives   Nsaids Rash    Family  History  Problem Relation Age of Onset   Seizures Mother    Diabetes Mother    Heart attack Father    Hypertension Brother    Eczema Daughter    Anxiety disorder Maternal Grandmother    Hypertension Brother    Hypertension Brother    Hypertension Brother    Hypertension Brother     Social History   Socioeconomic History   Marital status: Single    Spouse name: Not on file   Number of children: Not on file   Years of education: Not on file   Highest education level: Not on file  Occupational History   Not on file  Tobacco Use   Smoking status: Every Day    Current packs/day: 0.50    Average packs/day: 0.5 packs/day for 40.0 years (20.0 ttl pk-yrs)    Types: Cigarettes   Smokeless tobacco: Never   Tobacco comments:    Has smoked since the age of 52 yrs old.  Vaping Use   Vaping  status: Never Used  Substance and Sexual Activity   Alcohol use: Yes   Drug use: Not Currently    Types: Marijuana    Comment: pt denies this as of 10-05-22   Sexual activity: Yes    Birth control/protection: None  Other Topics Concern   Not on file  Social History Narrative   Not on file   Social Determinants of Health   Financial Resource Strain: High Risk (08/16/2022)   Overall Financial Resource Strain (CARDIA)    Difficulty of Paying Living Expenses: Hard  Food Insecurity: No Food Insecurity (06/24/2021)   Hunger Vital Sign    Worried About Running Out of Food in the Last Year: Never true    Ran Out of Food in the Last Year: Never true  Transportation Needs: No Transportation Needs (06/24/2021)   PRAPARE - Administrator, Civil Service (Medical): No    Lack of Transportation (Non-Medical): No  Physical Activity: Sufficiently Active (06/24/2021)   Exercise Vital Sign    Days of Exercise per Week: 3 days    Minutes of Exercise per Session: 60 min  Stress: Stress Concern Present (08/16/2022)   [employer] of Occupational Health - Occupational Stress Questionnaire    Feeling of Stress : Rather much  Social Connections: Unknown (06/24/2021)   Social Connection and Isolation Panel [NHANES]    Frequency of Communication with Friends and Family: More than three times a week    Frequency of Social Gatherings with Friends and Family: More than three times a week    Attends Religious Services: Not on file    Active Member of Clubs or Organizations: Not on file    Attends Banker Meetings: Not on file    Marital Status: Never married  Intimate Partner Violence: Not At Risk (05/26/2022)   Humiliation, Afraid, Rape, and Kick questionnaire    Fear of Current or Ex-Partner: No    Emotionally Abused: No    Physically Abused: No    Sexually Abused: No    ROS:  Constitutional: Denies fever, malaise, fatigue, headache or abrupt weight changes.  HEENT:  Denies eye pain, eye redness, ear pain, ringing in the ears, wax buildup, runny nose, nasal congestion, bloody nose, or sore throat. Respiratory: Denies difficulty breathing, shortness of breath, cough or sputum production.   Cardiovascular: Denies chest pain, chest tightness, palpitations or swelling in the hands or feet.  Gastrointestinal: Pt reports constipation. Denies abdominal pain, bloating, diarrhea or blood  in the stool.  GU: Denies frequency, urgency, pain with urination, blood in urine, odor or discharge. Musculoskeletal: Patient reports chronic back pain. Denies decrease in range of motion, difficulty with gait, or joint swelling.  Skin: Denies redness, rashes, lesions or ulcercations.  Neurological: Pt reports seizures. Denies dizziness, difficulty with memory, difficulty with speech or problems with balance and coordination.  Psych: Denies anxiety, depression, SI/HI.  No other specific complaints in a complete review of systems (except as listed in HPI above).  PE:  BP 138/72   Ht 5\' 5"  (1.651 m)   Wt 146 lb (66.2 kg)   BMI 24.30 kg/m   Wt Readings from Last 3 Encounters:  06/21/23 165 lb (74.8 kg)  06/14/23 142 lb 12.8 oz (64.8 kg)  05/25/23 143 lb 14.4 oz (65.3 kg)    General: Appears her stated age, well developed, well nourished in NAD. HEENT: Head: normal shape and size; Eyes: sclera white, no icterus, conjunctiva pink, PERRLA and EOMs intact;   Cardiovascular: Normal rate and rhythm. S1,S2 noted.  No murmur, rubs or gallops noted. No JVD or BLE edema. No carotid bruits noted. Pulmonary/Chest: Normal effort and positive vesicular breath sounds. No respiratory distress. No wheezes, rales or ronchi noted.  Abdomen: Soft and nontender. Normal bowel sounds. Musculoskeletal: Pain with palpation over the lumbar spine.  No difficulty with gait.  Neurological: Alert and oriented. Cranial nerves II-XII grossly intact. Coordination normal.  Psychiatric: Mood and affect  normal. Behavior is normal. Judgment and thought content normal.     BMET    Component Value Date/Time   NA 139 06/21/2023 1936   NA 146 (H) 12/01/2020 0954   K 3.2 (L) 06/21/2023 1936   CL 106 06/21/2023 1936   CO2 28 06/21/2023 1936   GLUCOSE 157 (H) 06/21/2023 1936   BUN 21 (H) 06/21/2023 1936   BUN 14 12/01/2020 0954   CREATININE 0.62 06/21/2023 1936   CALCIUM 9.3 06/21/2023 1936   GFRNONAA >60 06/21/2023 1936   GFRAA >60 02/24/2020 1426    Lipid Panel     Component Value Date/Time   CHOL 249 (H) 12/31/2020 1406   TRIG 228 (H) 12/31/2020 1406   HDL 49 12/31/2020 1406   CHOLHDL 4.7 03/12/2016 0658   VLDL 15 03/12/2016 0658   LDLCALC 158 (H) 12/31/2020 1406    CBC    Component Value Date/Time   WBC 6.2 06/21/2023 1936   RBC 4.30 06/21/2023 1936   HGB 12.2 06/21/2023 1936   HGB 11.8 12/01/2020 0954   HCT 37.6 06/21/2023 1936   HCT 36.1 12/01/2020 0954   PLT 308 06/21/2023 1936   PLT 319 12/01/2020 0954   MCV 87.4 06/21/2023 1936   MCV 85 12/01/2020 0954   MCH 28.4 06/21/2023 1936   MCHC 32.4 06/21/2023 1936   RDW 14.5 06/21/2023 1936   RDW 14.0 12/01/2020 0954   LYMPHSABS 1.6 06/21/2023 1936   LYMPHSABS 1.6 12/01/2020 0954   MONOABS 0.5 06/21/2023 1936   EOSABS 0.1 06/21/2023 1936   EOSABS 0.1 12/01/2020 0954   BASOSABS 0.0 06/21/2023 1936   BASOSABS 0.0 12/01/2020 0954    Hgb A1C Lab Results  Component Value Date   HGBA1C 5.4 12/31/2020     Assessment and Plan:   RTC in 6 months for your annual exam Nicki Reaper, NP

## 2023-06-29 NOTE — Assessment & Plan Note (Signed)
Report multiple seizures off medication Referral to neurology placed for further evaluation

## 2023-06-29 NOTE — Patient Instructions (Signed)

## 2023-06-29 NOTE — Assessment & Plan Note (Signed)
MRI lumbar spine ordered today Offered referral to pain management but she declines at this time

## 2023-06-29 NOTE — Assessment & Plan Note (Signed)
Avoid foods that trigger reflux Continue milk as needed or take Tums OTC

## 2023-06-29 NOTE — Assessment & Plan Note (Signed)
C-Met and lipid profile today Encouraged her to consume a low-fat diet

## 2023-06-30 ENCOUNTER — Telehealth: Payer: Self-pay

## 2023-06-30 LAB — LIPID PANEL
Cholesterol: 206 mg/dL — ABNORMAL HIGH (ref <200)
HDL: 59 mg/dL (ref >=50)
LDL Cholesterol (Calc): 127 mg/dL — ABNORMAL HIGH
Non-HDL Cholesterol (Calc): 147 mg/dL — ABNORMAL HIGH (ref <130)
Total CHOL/HDL Ratio: 3.5 (calc) (ref <5.0)
Triglycerides: 97 mg/dL (ref <150)

## 2023-06-30 LAB — HEMOGLOBIN A1C
Hgb A1c MFr Bld: 5.3 %{Hb} (ref <5.7)
Mean Plasma Glucose: 105 mg/dL
eAG (mmol/L): 5.8 mmol/L

## 2023-06-30 NOTE — Telephone Encounter (Signed)
Left message for patient to call back to go over results and recommendations.

## 2023-06-30 NOTE — Telephone Encounter (Signed)
-----   Message from [facility] sent at 06/30/2023 12:44 PM EDT ----- Cholesterol remains elevated.  I would recommend cholesterol-lowering medication at this time.  Please let me know if she is agreeable with this and I will send this in.  She does not have diabetes.

## 2023-07-04 NOTE — Telephone Encounter (Signed)
Left message for patient to call back to discuss results and recommendations. °

## 2023-07-17 ENCOUNTER — Ambulatory Visit: Payer: Medicaid Other | Admitting: Family Medicine

## 2023-07-17 DIAGNOSIS — Z113 Encounter for screening for infections with a predominantly sexual mode of transmission: Secondary | ICD-10-CM

## 2023-07-17 DIAGNOSIS — Z8619 Personal history of other infectious and parasitic diseases: Secondary | ICD-10-CM

## 2023-07-17 NOTE — Progress Notes (Signed)
Karina Anderson Department  STI clinic/screening visit [address] [place] [address] [phone]  Subjective:  Karina Anderson is a 52 y.o. female being seen today for an STI screening visit. The patient reports they do not have symptoms.  Patient reports that they do not desire a pregnancy in the next year.   They reported they are not interested in discussing contraception today.    No LMP recorded.  Patient has the following medical conditions:   Patient Active Problem List   Diagnosis Date Noted   GERD (gastroesophageal reflux disease) 06/29/2023   Mixed hyperlipidemia 06/29/2023   Chronic back pain 11/08/2021   Bipolar affective disorder, manic, severe, with psychotic behavior (HCC) 03/12/2016   Seizures (HCC) 03/11/2016    No chief complaint on file.   HPI  Patient reports to clinic today for "I need a paper that says that my syphilis is negative. I need to donate plasma today- I'm going to be evicted". I reviewed that I have papers that show that she has had the 3 injections (about 1 year ago), however she never came back for blood work. Patient states she needs the results today. I reviewed that we cannot get the results today, as our lab sends out these blood samples. Patient expressed dissatisfaction with this process. Agrees to do the blood work today- declines any other parts of exam or questioning.  Does the patient using douching products? Unknown- declined to answer  Last HIV test per patient/review of record was  Lab Results  Component Value Date   HMHIVSCREEN Negative - Validated 05/26/2022    Lab Results  Component Value Date   HIV Non Reactive 03/09/2022     Last HEPC test per patient/review of record was  Lab Results  Component Value Date   HMHEPCSCREEN Negative-Validated 05/26/2022   No components found for: "HEPC"   Last HEPB test per patient/review of record was No components found for: "HMHEPBSCREEN" No components found for:  "HEPC"   Patient reports last pap was  Lab Results  Component Value Date   DIAGPAP  12/31/2020    - Negative for intraepithelial lesion or malignancy (NILM)   No results found for: "SPECADGYN"  Screening for MPX risk: Does the patient have an unexplained rash? Unknown- declined to answer Is the patient MSM? No Does the patient endorse multiple sex partners or anonymous sex partners? Unknown- declined to answer Did the patient have close or sexual contact with a person diagnosed with MPX? Unknown- declined to answer Has the patient traveled outside the [place] where MPX is endemic? Unknown- declined to answer Is there a high clinical suspicion for MPX-- evidenced by one of the following Unknown- declined to answer  -Unlikely to be chickenpox  -Lymphadenopathy  -Rash that present in same phase of evolution on any given body part See flowsheet for further details and programmatic requirements.   Immunization history:  Immunization History  Administered Date(s) Administered   Td 08/29/2016     The following portions of the patient's history were reviewed and updated as appropriate: allergies, current medications, past medical history, past social history, past surgical history and problem list.  Objective:  There were no vitals filed for this visit.  Physical Exam  declined Assessment and Plan:  Karina Anderson is a 52 y.o. female presenting to the [facility] Department for STI screening  1. Screening for venereal disease  - Syphilis Serology, [facility]  2. History of syphilis  - Syphilis Serology,  [facility]   Patient accepted screening for syphilis today. Patient meets criteria for HepB screening? Unknown- declined to answer Patient meets criteria for HepC screening? Unknown- declined to answer  Discussed time line for [facility] results and that patient will be called with positive results and encouraged patient to call if she had not heard in 2 weeks.   Counseled to return or seek care for continued or worsening symptoms Recommended repeat testing in 3 months with positive results. Recommended condom use with all sex  Patient is currently using  unkown  to prevent pregnancy.    Return in about 6 months (around 01/14/2024) for STI screening.  Future Appointments  Date Time Provider Department Center  07/17/2023 10:05 AM Lenice Llamas, FNP [facility] None  12/25/2023  2:20 PM Baity, Salvadore Oxford, NP [facility]    Lenice Llamas, [place]

## 2023-07-17 NOTE — Progress Notes (Signed)
Pt is here today for STD screening, condoms offered declined.Sonda Primes, RN.

## 2023-07-18 ENCOUNTER — Ambulatory Visit: Payer: 59 | Admitting: Anesthesiology

## 2023-07-18 ENCOUNTER — Other Ambulatory Visit: Payer: Self-pay

## 2023-07-18 ENCOUNTER — Encounter
Admission: RE | Disposition: A | Discharge status: Home / Self Care | Payer: Self-pay | Source: Home / Self Care | Attending: Gastroenterology

## 2023-07-18 ENCOUNTER — Ambulatory Visit
Admission: RE | Admit: 2023-07-18 | Discharge: 2023-07-18 | Disposition: A | Discharge status: Home / Self Care | Payer: 59 | Attending: Gastroenterology | Admitting: Gastroenterology

## 2023-07-18 ENCOUNTER — Encounter: Payer: Self-pay | Admitting: Gastroenterology

## 2023-07-18 DIAGNOSIS — K219 Gastro-esophageal reflux disease without esophagitis: Secondary | ICD-10-CM | POA: Insufficient documentation

## 2023-07-18 DIAGNOSIS — F17210 Nicotine dependence, cigarettes, uncomplicated: Secondary | ICD-10-CM | POA: Diagnosis not present

## 2023-07-18 DIAGNOSIS — F319 Bipolar disorder, unspecified: Secondary | ICD-10-CM | POA: Insufficient documentation

## 2023-07-18 DIAGNOSIS — Z1211 Encounter for screening for malignant neoplasm of colon: Secondary | ICD-10-CM | POA: Diagnosis not present

## 2023-07-18 DIAGNOSIS — M545 Low back pain, unspecified: Secondary | ICD-10-CM | POA: Insufficient documentation

## 2023-07-18 DIAGNOSIS — G8929 Other chronic pain: Secondary | ICD-10-CM | POA: Diagnosis not present

## 2023-07-18 DIAGNOSIS — Z79899 Other long term (current) drug therapy: Secondary | ICD-10-CM | POA: Insufficient documentation

## 2023-07-18 DIAGNOSIS — R569 Unspecified convulsions: Secondary | ICD-10-CM | POA: Diagnosis not present

## 2023-07-18 DIAGNOSIS — D649 Anemia, unspecified: Secondary | ICD-10-CM | POA: Insufficient documentation

## 2023-07-18 HISTORY — PX: COLONOSCOPY WITH PROPOFOL: SHX5780

## 2023-07-18 SURGERY — COLONOSCOPY WITH PROPOFOL
Anesthesia: General

## 2023-07-18 MED ORDER — PROPOFOL 500 MG/50ML IV EMUL
INTRAVENOUS | Status: DC | PRN
  Administered 2023-07-18: 125 ug/kg/min via INTRAVENOUS

## 2023-07-18 MED ORDER — SODIUM CHLORIDE 0.9 % IV SOLN: INTRAVENOUS | Status: DC

## 2023-07-18 MED ORDER — PROPOFOL 10 MG/ML IV BOLUS
INTRAVENOUS | Status: DC | PRN
  Administered 2023-07-18: 60 mg via INTRAVENOUS
  Administered 2023-07-18: 40 mg via INTRAVENOUS

## 2023-07-18 MED ORDER — LIDOCAINE HCL (CARDIAC) PF 100 MG/5ML IV SOSY
PREFILLED_SYRINGE | INTRAVENOUS | Status: DC | PRN
  Administered 2023-07-18: 50 mg via INTRAVENOUS

## 2023-07-18 NOTE — Transfer of Care (Signed)
Immediate Anesthesia Transfer of Care Note  Patient: Karina Anderson  Procedure(s) Performed: COLONOSCOPY WITH PROPOFOL  Patient Location: PACU and Endoscopy Unit  Anesthesia Type:General  Level of Consciousness: awake, drowsy, and patient cooperative  Airway & Oxygen Therapy: Patient Spontanous Breathing  Post-op Assessment: Report given to RN and Post -op Vital signs reviewed and stable  Post vital signs: Reviewed and stable  Last Vitals:  Vitals Value Taken Time  BP 110/70 07/18/23 1138  Temp    Pulse 84 07/18/23 1138  Resp 18 07/18/23 1138  SpO2 100 % 07/18/23 1138  Vitals shown include unfiled device data.  Last Pain:  Vitals:   07/18/23 1030  TempSrc: Temporal  PainSc: 10-Worst pain ever         Complications: No notable events documented.

## 2023-07-18 NOTE — Op Note (Signed)
[facility] Gastroenterology Patient Name: Karina Anderson Procedure Date: 07/18/2023 11:06 AM MRN: [id number] Account #: [id number] Date of Birth: [birth year] Admit Type: Outpatient Age: 52 Room: [facility] ENDO ROOM 4 Gender: Female Note Status: Finalized Instrument Name: Prentice Docker [id number] Procedure:             Colonoscopy Indications:           Screening for colorectal malignant neoplasm, This is                         the patient's first colonoscopy Providers:             Toney Reil MD, MD Referring MD:          No Local Md, MD (Referring MD) Medicines:             General Anesthesia Complications:         No immediate complications. Estimated blood loss: None. Procedure:             Pre-Anesthesia Assessment:                        - Prior to the procedure, a History and Physical was                         performed, and patient medications and allergies were                         reviewed. The patient is competent. The risks and                         benefits of the procedure and the sedation options and                         risks were discussed with the patient. All questions                         were answered and informed consent was obtained.                         Patient identification and proposed procedure were                         verified by the physician, the nurse, the                         anesthesiologist, the anesthetist and the technician                         in the pre-procedure area in the procedure room in the                         endoscopy suite. Mental Status Examination: alert and                         oriented. Airway Examination: normal oropharyngeal                         airway and neck mobility. Respiratory Examination:  clear to auscultation. CV Examination: normal.                         Prophylactic Antibiotics: The patient does not require                         prophylactic  antibiotics. Prior Anticoagulants: The                         patient has taken no anticoagulant or antiplatelet                         agents. ASA Grade Assessment: II - A patient with mild                         systemic disease. After reviewing the risks and                         benefits, the patient was deemed in satisfactory                         condition to undergo the procedure. The anesthesia                         plan was to use general anesthesia. Immediately prior                         to administration of medications, the patient was                         re-assessed for adequacy to receive sedatives. The                         heart rate, respiratory rate, oxygen saturations,                         blood pressure, adequacy of pulmonary ventilation, and                         response to care were monitored throughout the                         procedure. The physical status of the patient was                         re-assessed after the procedure.                        After obtaining informed consent, the colonoscope was                         passed under direct vision. Throughout the procedure,                         the patient's blood pressure, pulse, and oxygen                         saturations were monitored continuously. The  Colonoscope was introduced through the anus and                         advanced to the the cecum, identified by appendiceal                         orifice and ileocecal valve. The colonoscopy was                         performed without difficulty. The patient tolerated                         the procedure well. The quality of the bowel                         preparation was evaluated using the BBPS [facility] Bowel                         Preparation Scale) with scores of: Right Colon = 3,                         Transverse Colon = 3 and Left Colon = 3 (entire mucosa                         seen  well with no residual staining, small fragments                         of stool or opaque liquid). The total BBPS score                         equals 9. The ileocecal valve, appendiceal orifice,                         and rectum were photographed. Findings:      The perianal and digital rectal examinations were normal. Pertinent       negatives include normal sphincter tone and no palpable rectal lesions.      The entire examined colon appeared normal.      The retroflexed view of the distal rectum and anal verge was normal and       showed no anal or rectal abnormalities. Impression:            - The entire examined colon is normal.                        - The distal rectum and anal verge are normal on                         retroflexion view.                        - No specimens collected. Recommendation:        - Discharge patient to home (with escort).                        - Resume previous diet today.                        -  Continue present medications.                        - Repeat colonoscopy in 10 years for screening                         purposes. Procedure Code(s):     --- Professional ---                        X5284, Colorectal cancer screening; colonoscopy on                         individual not meeting criteria for high risk Diagnosis Code(s):     --- Professional ---                        Z12.11, Encounter for screening for malignant neoplasm                         of colon CPT copyright 2022 American Medical Association. All rights reserved. The codes documented in this report are preliminary and upon coder review may  be revised to meet current compliance requirements. Dr. Libby Maw Toney Reil MD, MD 07/18/2023 11:35:57 AM This report has been signed electronically. Number of Addenda: 0 Note Initiated On: 07/18/2023 11:06 AM Scope Withdrawal Time: 0 hours 7 minutes 42 seconds  Total Procedure Duration: 0 hours 10 minutes 31 seconds   Estimated Blood Loss:  Estimated blood loss: none.      [facility]

## 2023-07-18 NOTE — H&P (Signed)
Karina Repress, MD [address]  [address]  [place], [place] [address]  Main: [phone]  Fax: [phone] Pager: [phone]  Primary Care Physician:  Pcp, No Primary Gastroenterologist:  Dr. Arlyss Anderson  Pre-Procedure History & Physical: HPI:  Karina Anderson Karina a 52 y.o. female Karina here for a colonoscopy.   Past Medical History:  Diagnosis Date   Anemia    Arthritis    Bipolar disorder (HCC)    Chronic lower back pain    Chronic pain disorder    GERD (gastroesophageal reflux disease)    History of kidney stones    History of psychosis    History of suicide attempt    Hydradenitis    Involuntary commitment    Marijuana use    Seizures (HCC)    last was 2018   Syphilis    Tobacco use    Ventral hernia     Past Surgical History:  Procedure Laterality Date   CHOLECYSTECTOMY  04/15/2022   DILATION AND CURETTAGE OF UTERUS     3 miscarriages   HYDRADENITIS EXCISION     R underarm   INSERTION OF MESH  10/11/2022   Procedure: INSERTION OF MESH;  Surgeon: Leafy Ro, MD;  Location: [facility] ORS;  Service: General;;   TUBAL LIGATION     TUMOR REMOVAL  07/2020   index finger on L hand   VENTRAL HERNIA REPAIR N/A 10/11/2022   Procedure: HERNIA REPAIR VENTRAL ADULT, open;  Surgeon: Leafy Ro, MD;  Location: [facility] ORS;  Service: General;  Laterality: N/A;    Prior to Admission medications   Medication Sig Start Date End Date Taking? Authorizing Provider  etodolac (LODINE XL) 500 MG 24 hr tablet Take 500 mg by mouth 2 (two) times daily.   Yes [provider]  gabapentin (NEURONTIN) 300 MG capsule Take 300 mg by mouth 3 (three) times daily.   Yes [provider]  ondansetron (ZOFRAN) 4 MG tablet Take 1 tablet (4 mg total) by mouth daily as needed. 06/21/23 06/20/24  [name], MD  polyethylene glycol (MIRALAX / GLYCOLAX) 17 g packet Take 17 g by mouth daily. Mix one tablespoon with 8oz of your favorite juice or water every day until you are  having soft formed stools. Then start taking once daily if you didn't have a stool the day before. Patient taking differently: Take 17 g by mouth daily as needed. Mix one tablespoon with 8oz of your favorite juice or water every day until you are having soft formed stools. Then start taking once daily if you didn't have a stool the day before. 07/26/22   Willy Eddy, MD    Allergies as of 06/15/2023 - Review Complete 06/15/2023  Allergen Reaction Noted   Citrus Hives 04/04/2013   Sulfamethoxazole-trimethoprim Hives 10/18/2013   Butorphanol Hives 04/01/2015   Contrast media [iodinated contrast media] Nausea And Vomiting 06/30/2019   Ibuprofen  10/01/2015   Ketorolac  10/01/2015   Lorazepam Hives 10/01/2015   Naproxen  10/01/2015   Prochlorperazine Hives 10/01/2015   Nsaids Rash 04/01/2015    Family History  Problem Relation Age of Onset   Seizures Mother    Diabetes Mother    Heart attack Father    Hypertension Brother    Eczema Daughter    Anxiety disorder Maternal Grandmother    Hypertension Brother    Hypertension Brother    Hypertension Brother    Hypertension Brother     Social History  Socioeconomic History   Marital status: Single    Spouse name: Not on file   Number of children: Not on file   Years of education: Not on file   Highest education level: Not on file  Occupational History   Not on file  Tobacco Use   Smoking status: Every Day    Current packs/day: 0.50    Average packs/day: 0.5 packs/day for 40.0 years (20.0 ttl pk-yrs)    Types: Cigarettes   Smokeless tobacco: Never   Tobacco comments:    Has smoked since the age of 52 yrs old.  Vaping Use   Vaping status: Never Used  Substance and Sexual Activity   Alcohol use: Yes   Drug use: Not Currently    Types: Marijuana    Comment: pt denies this as of 10-05-22   Sexual activity: Yes    Birth control/protection: None  Other Topics Concern   Not on file  Social History Narrative   Not on  file   Social Determinants of Health   Financial Resource Strain: High Risk (08/16/2022)   Overall Financial Resource Strain (CARDIA)    Difficulty of Paying Living Expenses: Hard  Food Insecurity: No Food Insecurity (06/24/2021)   Hunger Vital Sign    Worried About Running Out of Food in the Last Year: Never true    Ran Out of Food in the Last Year: Never true  Transportation Needs: No Transportation Needs (06/24/2021)   PRAPARE - Administrator, Civil Service (Medical): No    Lack of Transportation (Non-Medical): No  Physical Activity: Sufficiently Active (06/24/2021)   Exercise Vital Sign    Days of Exercise per Week: 3 days    Minutes of Exercise per Session: 60 min  Stress: Stress Concern Present (08/16/2022)   [employer] of Occupational Health - Occupational Stress Questionnaire    Feeling of Stress : Rather much  Social Connections: Unknown (06/24/2021)   Social Connection and Isolation Panel [NHANES]    Frequency of Communication with Friends and Family: More than three times a week    Frequency of Social Gatherings with Friends and Family: More than three times a week    Attends Religious Services: Not on file    Active Member of Clubs or Organizations: Not on file    Attends Banker Meetings: Not on file    Marital Status: Never married  Intimate Partner Violence: Not At Risk (05/26/2022)   Humiliation, Afraid, Rape, and Kick questionnaire    Fear of Current or Ex-Partner: No    Emotionally Abused: No    Physically Abused: No    Sexually Abused: No    Review of Systems: See HPI, otherwise negative ROS  Physical Exam: BP (!) 128/105   Pulse 85   Temp (!) 96.6 F (35.9 C) (Temporal)   Resp 18   Wt 65.1 kg   SpO2 100%   BMI 23.90 kg/m  General:   Alert,  pleasant and cooperative in NAD Head:  Normocephalic and atraumatic. Neck:  Supple; no masses or thyromegaly. Lungs:  Clear throughout to auscultation.    Heart:  Regular  rate and rhythm. Abdomen:  Soft, nontender and nondistended. Normal bowel sounds, without guarding, and without rebound.   Neurologic:  Alert and  oriented x4;  grossly normal neurologically.  Impression/Plan: Karina Anderson Karina here for an colonoscopy to be performed for colon cancer screening  Risks, benefits, limitations, and alternatives regarding  colonoscopy have been reviewed with the patient.  Questions have been answered.  All parties agreeable.   Lannette Donath, MD  07/18/2023, 11:03 AM

## 2023-07-18 NOTE — Anesthesia Postprocedure Evaluation (Signed)
Anesthesia Post Note  Patient: Karina Anderson  Procedure(s) Performed: COLONOSCOPY WITH PROPOFOL  Patient location during evaluation: PACU Anesthesia Type: General Level of consciousness: awake and alert Pain management: pain level controlled Vital Signs Assessment: post-procedure vital signs reviewed and stable Respiratory status: spontaneous breathing, nonlabored ventilation, respiratory function stable and patient connected to nasal cannula oxygen Cardiovascular status: blood pressure returned to baseline and stable Postop Assessment: no apparent nausea or vomiting Anesthetic complications: no   No notable events documented.   Last Vitals:  Vitals:   07/18/23 1148 07/18/23 1157  BP: 115/87 (!) 126/93  Pulse:  78  Resp:  18  Temp: 36.4 C   SpO2:  100%    Last Pain:  Vitals:   07/18/23 1157  TempSrc:   PainSc: 10-Worst pain ever                 Yevette Edwards

## 2023-07-18 NOTE — Anesthesia Preprocedure Evaluation (Signed)
Anesthesia Evaluation  Patient identified by MRN, date of birth, ID band Patient awake    Reviewed: Allergy & Precautions, H&P , NPO status , Patient's Chart, lab work & pertinent test results, reviewed documented beta blocker date and time   Airway Mallampati: II   Neck ROM: full    Dental  (+) Poor Dentition   Pulmonary neg pulmonary ROS, Current Smoker and Patient abstained from smoking.   Pulmonary exam normal        Cardiovascular Exercise Tolerance: Good negative cardio ROS Normal cardiovascular exam Rhythm:regular Rate:Normal     Neuro/Psych Seizures -,  PSYCHIATRIC DISORDERS   Bipolar Disorder      GI/Hepatic Neg liver ROS,GERD  Medicated,,  Endo/Other  negative endocrine ROS    Renal/GU negative Renal ROS  negative genitourinary   Musculoskeletal   Abdominal   Peds  Hematology  (+) Blood dyscrasia, anemia   Anesthesia Other Findings Past Medical History: No date: Anemia No date: Arthritis No date: Bipolar disorder (HCC) No date: Chronic lower back pain No date: Chronic pain disorder No date: GERD (gastroesophageal reflux disease) No date: History of kidney stones No date: History of psychosis No date: History of suicide attempt No date: Hydradenitis No date: Involuntary commitment No date: Marijuana use No date: Seizures [facility])     Comment:  last was 2018 No date: Syphilis No date: Tobacco use No date: Ventral hernia Past Surgical History: 04/15/2022: CHOLECYSTECTOMY No date: DILATION AND CURETTAGE OF UTERUS     Comment:  3 miscarriages No date: HYDRADENITIS EXCISION     Comment:  R underarm 10/11/2022: INSERTION OF MESH     Comment:  Procedure: INSERTION OF MESH;  Surgeon: Leafy Ro,               MD;  Location: [facility] ORS;  Service: General;; No date: TUBAL LIGATION 07/2020: TUMOR REMOVAL     Comment:  index finger on L hand 10/11/2022: VENTRAL HERNIA REPAIR; N/A     Comment:   Procedure: HERNIA REPAIR VENTRAL ADULT, open;  Surgeon:               Leafy Ro, MD;  Location: [facility] ORS;  Service:               General;  Laterality: N/A;   Reproductive/Obstetrics negative OB ROS                             Anesthesia Physical Anesthesia Plan  ASA: 2  Anesthesia Plan: General   Post-op Pain Management:    Induction:   PONV Risk Score and Plan:   Airway Management Planned:   Additional Equipment:   Intra-op Plan:   Post-operative Plan:   Informed Consent: I have reviewed the patients History and Physical, chart, labs and discussed the procedure including the risks, benefits and alternatives for the proposed anesthesia with the patient or authorized representative who has indicated his/her understanding and acceptance.     Dental Advisory Given  Plan Discussed with: CRNA  Anesthesia Plan Comments:        Anesthesia Quick Evaluation

## 2023-07-18 NOTE — Anesthesia Procedure Notes (Signed)
Date/Time: 07/18/2023 11:18 AM  Performed by: Elmarie Mainland, CRNAPre-anesthesia Checklist: Emergency Drugs available, Patient identified, Suction available and Patient being monitored Patient Re-evaluated:Patient Re-evaluated prior to induction Oxygen Delivery Method: Nasal cannula

## 2023-07-19 ENCOUNTER — Encounter: Payer: Self-pay | Admitting: Gastroenterology

## 2023-08-14 ENCOUNTER — Emergency Department
Admission: EM | Admit: 2023-08-14 | Discharge: 2023-08-14 | Discharge status: Against Medical Advice | Payer: 59 | Attending: Student in an Organized Health Care Education/Training Program | Admitting: Student in an Organized Health Care Education/Training Program

## 2023-08-14 ENCOUNTER — Other Ambulatory Visit: Payer: Self-pay

## 2023-08-14 ENCOUNTER — Encounter: Payer: Self-pay | Admitting: Emergency Medicine

## 2023-08-14 DIAGNOSIS — M545 Low back pain, unspecified: Secondary | ICD-10-CM | POA: Insufficient documentation

## 2023-08-14 DIAGNOSIS — Z5321 Procedure and treatment not carried out due to patient leaving prior to being seen by health care provider: Secondary | ICD-10-CM | POA: Diagnosis not present

## 2023-08-14 NOTE — ED Notes (Signed)
Pt to front desk asking how long it would be before she would be seen. Pt informed we could not give a wait time. Pt states that she will come back tomorrow.

## 2023-08-14 NOTE — ED Triage Notes (Signed)
Patient to ED via POV for lump on left lower back x1 week. States has used cold and warm compresses with no change.

## 2023-08-14 NOTE — ED Provider Triage Note (Signed)
Emergency Medicine Provider Triage Evaluation Note  Karina Anderson , a 52 y.o. female  was evaluated in triage.  Pt complains of localized lower left back pain without radiation.  She states there is a mass going on for a month.  Patient reports she has tried warm compresses and cool compresses, ibuprofen with no relief.  Patient reports Percocet is the only medication that has helped with this pain.  Denies saddle anesthesia, injury, loss of bowel and bladder control, numbness and leg pain.  Review of Systems  Positive:  Negative:   Physical Exam  BP (!) 137/97 (BP Location: Left Arm)   Pulse 79   Temp 98.2 F (36.8 C) (Oral)   Resp 19   Ht 5\' 5"  (1.651 m)   Wt 70.3 kg   SpO2 99%   BMI 25.79 kg/m  Gen:   Awake, no distress   Resp:  Normal effort  MSK:   Moves extremities without difficulty  Other:  No mass palpated.  Nontender along spinal processes.  Medical Decision Making  Medically screening exam initiated at 3:38 PM.  Appropriate orders placed.  Karina Anderson was informed that the remainder of the evaluation will be completed by another provider, this initial triage assessment does not replace that evaluation, and the importance of remaining in the ED until their evaluation is complete.    Romeo Apple, Karina Anderson A, PA-C 08/14/23 1541

## 2023-09-05 ENCOUNTER — Emergency Department
Admission: EM | Admit: 2023-09-05 | Discharge: 2023-09-05 | Disposition: A | Discharge status: Home / Self Care | Payer: 59 | Attending: Student in an Organized Health Care Education/Training Program | Admitting: Student in an Organized Health Care Education/Training Program

## 2023-09-05 ENCOUNTER — Other Ambulatory Visit: Payer: Self-pay

## 2023-09-05 DIAGNOSIS — G8929 Other chronic pain: Secondary | ICD-10-CM | POA: Diagnosis not present

## 2023-09-05 DIAGNOSIS — M5459 Other low back pain: Secondary | ICD-10-CM | POA: Diagnosis not present

## 2023-09-05 DIAGNOSIS — M545 Low back pain, unspecified: Secondary | ICD-10-CM | POA: Insufficient documentation

## 2023-09-05 LAB — URINALYSIS, ROUTINE W REFLEX MICROSCOPIC
Bacteria, UA: NONE SEEN
Bilirubin Urine: NEGATIVE
Glucose, UA: NEGATIVE mg/dL
Hgb urine dipstick: NEGATIVE
Ketones, ur: NEGATIVE mg/dL
Leukocytes,Ua: NEGATIVE
Nitrite: NEGATIVE
Protein, ur: 30 mg/dL — AB
Specific Gravity, Urine: 1.028 (ref 1.005–1.030)
pH: 5 (ref 5.0–8.0)

## 2023-09-05 MED ORDER — OXYCODONE-ACETAMINOPHEN 5-325 MG PO TABS
1.0000 | ORAL_TABLET | Freq: Once | ORAL | Status: AC
  Administered 2023-09-05: 1 via ORAL
  Filled 2023-09-05: qty 1

## 2023-09-05 MED ORDER — PREDNISONE 50 MG PO TABS: 50.0000 mg | ORAL_TABLET | Freq: Every day | ORAL | 0 refills | Status: DC

## 2023-09-05 MED ORDER — METHOCARBAMOL 500 MG PO TABS
1000.0000 mg | ORAL_TABLET | Freq: Once | ORAL | Status: AC
  Administered 2023-09-05: 1000 mg via ORAL
  Filled 2023-09-05: qty 2

## 2023-09-05 MED ORDER — METHOCARBAMOL 500 MG PO TABS: 500.0000 mg | ORAL_TABLET | Freq: Four times a day (QID) | ORAL | 0 refills | Status: DC

## 2023-09-05 MED ORDER — OXYCODONE-ACETAMINOPHEN 5-325 MG PO TABS: 1.0000 | ORAL_TABLET | Freq: Four times a day (QID) | ORAL | 0 refills | Status: DC | PRN

## 2023-09-05 MED ORDER — PREDNISONE 20 MG PO TABS
60.0000 mg | ORAL_TABLET | Freq: Once | ORAL | Status: AC
  Administered 2023-09-05: 60 mg via ORAL
  Filled 2023-09-05: qty 3

## 2023-09-05 NOTE — ED Provider Notes (Signed)
 [facility] Provider Note  Patient Contact: 8:45 PM (approximate)   History   Back Pain   HPI  Karina Anderson is a 53 y.o. female who presents the emergency department with acute on chronic back pain.  Patient has been dealing with chronic back issues for 20 years.  Patient states that she is seeing orthopedics and they have told her that she is not a surgical candidate.  There is no radicular symptoms.  No bowel or bladder dysfunction, saddle anesthesia or paresthesias.     Physical Exam   Triage Vital Signs: ED Triage Vitals  Encounter Vitals Group     BP 09/05/23 1724 (!) 122/92     Systolic BP Percentile --      Diastolic BP Percentile --      Pulse Rate 09/05/23 1724 (!) 104     Resp 09/05/23 1724 20     Temp 09/05/23 1724 98.5 F (36.9 C)     Temp Source 09/05/23 1724 Oral     SpO2 09/05/23 1724 100 %     Weight 09/05/23 1725 145 lb (65.8 kg)     Height 09/05/23 1725 5' 5 (1.651 m)     Head Circumference --      Peak Flow --      Pain Score 09/05/23 1724 10     Pain Loc --      Pain Education --      Exclude from Growth Chart --     Most recent vital signs: Vitals:   09/05/23 1724  BP: (!) 122/92  Pulse: (!) 104  Resp: 20  Temp: 98.5 F (36.9 C)  SpO2: 100%     General: Alert and in no acute distress.  Cardiovascular:  Good peripheral perfusion Respiratory: Normal respiratory effort without tachypnea or retractions. Lungs CTAB.  Musculoskeletal: Full range of motion to all extremities.  Patient has tenderness in the lumbar spine along the left spinal border.  No palpable abnormalities or step-off.  No extension into the SI joint or sciatic notch. Neurologic:  No gross focal neurologic deficits are appreciated.  Skin:   No rash noted Other:   ED Results / Procedures / Treatments   Labs (all labs ordered are listed, but only abnormal results are displayed) Labs Reviewed  URINALYSIS, ROUTINE W REFLEX MICROSCOPIC - Abnormal;  Notable for the following components:      Result Value   Color, Urine YELLOW (*)    APPearance HAZY (*)    Protein, ur 30 (*)    All other components within normal limits     EKG     RADIOLOGY    No results found.  PROCEDURES:  Critical Care performed: No  Procedures   MEDICATIONS ORDERED IN ED: Medications  oxyCODONE -acetaminophen  (PERCOCET/ROXICET) 5-325 MG per tablet 1 tablet (has no administration in time range)  methocarbamol  (ROBAXIN ) tablet 1,000 mg (has no administration in time range)  predniSONE  (DELTASONE ) tablet 60 mg (has no administration in time range)     IMPRESSION / MDM / ASSESSMENT AND PLAN / ED COURSE  I reviewed the triage vital signs and the nursing notes.                                 Differential diagnosis includes, but is not limited to, chronic back pain, sciatica, herniated disc, cauda equina   Patient's presentation is most consistent with acute presentation with potential threat to  life or bodily function.   Patient's diagnosis is consistent with chronic back pain.  Patient presents with acute on chronic back pain.  Patient has a long history of chronic issues.  She states that she has seen 2 different orthopedist that has told her that she is not a surgical candidate.  She is never seen neurosurgery.  There is no concerning neurodeficits.  At this time as there is no complicating factors I do not feel that further imaging of this area is necessary at this time.  Symptom control medications prescribed.  Follow-up with neurosurgery..  Patient is given ED precautions to return to the ED for any worsening or new symptoms.     FINAL CLINICAL IMPRESSION(S) / ED DIAGNOSES   Final diagnoses:  Chronic midline low back pain without sciatica     Rx / DC Orders   ED Discharge Orders          Ordered    oxyCODONE -acetaminophen  (PERCOCET/ROXICET) 5-325 MG tablet  Every 6 hours PRN        09/05/23 2052    methocarbamol  (ROBAXIN ) 500  MG tablet  4 times daily        09/05/23 2052    predniSONE  (DELTASONE ) 50 MG tablet  Daily with breakfast        09/05/23 2052             Note:  This document was prepared using Dragon voice recognition software and may include unintentional dictation errors.   Ana Dorn JONETTA DEVONNA 09/05/23 2052    Lang Dover, MD 09/05/23 2329

## 2023-09-05 NOTE — ED Triage Notes (Signed)
 Patient complaining of pain to left lower back for 2 weeks; denies improvement with Tylenol and Epsom salt. Patient ambulates with no difficulty.

## 2023-09-06 ENCOUNTER — Telehealth: Payer: Self-pay | Admitting: Neurosurgery

## 2023-09-06 NOTE — Telephone Encounter (Signed)
 can see [place] in [facility], MRI is ordered, should have MRI before seeing APP Received: Today Lovenia Kim, MD  [facility]surgery Admin   Dr. Katrinka Blazing said the MRI had been ordered, but it looks like the last MRI ordered was denied by her insurance.

## 2023-10-03 ENCOUNTER — Ambulatory Visit: Payer: 59 | Admitting: Pediatrics

## 2023-10-24 ENCOUNTER — Other Ambulatory Visit: Payer: Self-pay

## 2023-10-24 ENCOUNTER — Emergency Department
Admission: EM | Admit: 2023-10-24 | Discharge: 2023-10-24 | Disposition: A | Discharge status: Home / Self Care | Payer: 59 | Attending: Emergency Medicine | Admitting: Emergency Medicine

## 2023-10-24 DIAGNOSIS — K279 Peptic ulcer, site unspecified, unspecified as acute or chronic, without hemorrhage or perforation: Secondary | ICD-10-CM | POA: Insufficient documentation

## 2023-10-24 DIAGNOSIS — R109 Unspecified abdominal pain: Secondary | ICD-10-CM | POA: Diagnosis present

## 2023-10-24 LAB — URINALYSIS, ROUTINE W REFLEX MICROSCOPIC
Bilirubin Urine: NEGATIVE
Glucose, UA: NEGATIVE mg/dL
Hgb urine dipstick: NEGATIVE
Ketones, ur: NEGATIVE mg/dL
Leukocytes,Ua: NEGATIVE
Nitrite: NEGATIVE
Protein, ur: NEGATIVE mg/dL
Specific Gravity, Urine: 1.02 (ref 1.005–1.030)
pH: 6 (ref 5.0–8.0)

## 2023-10-24 LAB — LIPASE, BLOOD: Lipase: 25 U/L (ref 11–51)

## 2023-10-24 LAB — COMPREHENSIVE METABOLIC PANEL
ALT: 11 U/L (ref 0–44)
AST: 16 U/L (ref 15–41)
Albumin: 3.7 g/dL (ref 3.5–5.0)
Alkaline Phosphatase: 58 U/L (ref 38–126)
Anion gap: 8 (ref 5–15)
BUN: 21 mg/dL — ABNORMAL HIGH (ref 6–20)
CO2: 25 mmol/L (ref 22–32)
Calcium: 9.1 mg/dL (ref 8.9–10.3)
Chloride: 106 mmol/L (ref 98–111)
Creatinine, Ser: 0.91 mg/dL (ref 0.44–1.00)
GFR, Estimated: >60 mL/min (ref >60)
Glucose, Bld: 111 mg/dL — ABNORMAL HIGH (ref 70–99)
Potassium: 3.7 mmol/L (ref 3.5–5.1)
Sodium: 139 mmol/L (ref 135–145)
Total Bilirubin: 0.5 mg/dL (ref 0.0–1.2)
Total Protein: 7 g/dL (ref 6.5–8.1)

## 2023-10-24 LAB — POC URINE PREG, ED: Preg Test, Ur: NEGATIVE

## 2023-10-24 LAB — CBC
HCT: 37.5 % (ref 36.0–46.0)
Hemoglobin: 11.8 g/dL — ABNORMAL LOW (ref 12.0–15.0)
MCH: 28.8 pg (ref 26.0–34.0)
MCHC: 31.5 g/dL (ref 30.0–36.0)
MCV: 91.5 fL (ref 80.0–100.0)
Platelets: 260 10*3/uL (ref 150–400)
RBC: 4.1 MIL/uL (ref 3.87–5.11)
RDW: 13.1 % (ref 11.5–15.5)
WBC: 5 10*3/uL (ref 4.0–10.5)
nRBC: 0 % (ref 0.0–0.2)

## 2023-10-24 MED ORDER — OMEPRAZOLE MAGNESIUM 20 MG PO TBEC: 40.0000 mg | DELAYED_RELEASE_TABLET | Freq: Every day | ORAL | 0 refills | Status: AC

## 2023-10-24 MED ORDER — ALUM & MAG HYDROXIDE-SIMETH 200-200-20 MG/5ML PO SUSP
30.0000 mL | Freq: Once | ORAL | Status: AC
  Administered 2023-10-24: 30 mL via ORAL
  Filled 2023-10-24: qty 30

## 2023-10-24 MED ORDER — DICYCLOMINE HCL 10 MG/5ML PO SOLN
10.0000 mg | Freq: Once | ORAL | Status: AC
  Administered 2023-10-24: 10 mg via ORAL
  Filled 2023-10-24: qty 5

## 2023-10-24 MED ORDER — SUCRALFATE 1 GM/10ML PO SUSP: 1.0000 g | Freq: Four times a day (QID) | ORAL | 1 refills | Status: AC

## 2023-10-24 NOTE — Discharge Instructions (Addendum)
 Please follow the peptic ulcer disease eating plan.  Take medications as prescribed and call and schedule follow-up appointment with gastroenterologist.  Return to the ER for any fevers increasing pain worsening symptoms or any urgent changes in her health.

## 2023-10-24 NOTE — ED Provider Notes (Signed)
 [facility] Provider Note   CSN: [id number] Arrival date & time: 10/24/23  1244     History  Chief Complaint  Patient presents with   Abdominal Pain   Emesis    Karina Anderson is a 53 y.o. female with history of cholecystectomy presents to the emergency department valuation of nausea, vomiting and abdominal pain has been present for 2 weeks.  She has had similar pain a year ago, had a CT scan of the abdomen and pelvis that was negative.  She states every few days she will vomit half a cup of nonbloody fluid.  She has a gnawing burning pain along her epigastric region that is worse after eating and sometimes radiates to the esophageal region.  She denies any chest pain, shortness of breath.  She denies any constipation or bowel issues.  Her pain is moderate she has not taken any oral medications for gastric reflux.  No alcohol consumption.  I can patient all NSAID use, couple of ibuprofen tablets every 3 days.  Admits to a lot of fried/fatty foods.  HPI     Home Medications Prior to Admission medications   Medication Sig Start Date End Date Taking? Authorizing Provider  omeprazole (PRILOSEC OTC) 20 MG tablet Take 2 tablets (40 mg total) by mouth daily. 10/24/23  Yes Evon Slack, PA-C  sucralfate (CARAFATE) 1 GM/10ML suspension Take 10 mLs (1 g total) by mouth 4 (four) times daily. 10/24/23 10/23/24 Yes Evon Slack, PA-C  gabapentin (NEURONTIN) 300 MG capsule Take 300 mg by mouth 3 (three) times daily.    [provider]  methocarbamol (ROBAXIN) 500 MG tablet Take 1 tablet (500 mg total) by mouth 4 (four) times daily. 09/05/23   Cuthriell, Delorise Royals, PA-C  ondansetron (ZOFRAN) 4 MG tablet Take 1 tablet (4 mg total) by mouth daily as needed. 06/21/23 06/20/24  [name], MD  oxyCODONE-acetaminophen (PERCOCET/ROXICET) 5-325 MG tablet Take 1 tablet by mouth every 6 (six) hours as needed for severe pain (pain score 7-10). 09/05/23    Cuthriell, Delorise Royals, PA-C  polyethylene glycol (MIRALAX / GLYCOLAX) 17 g packet Take 17 g by mouth daily. Mix one tablespoon with 8oz of your favorite juice or water every day until you are having soft formed stools. Then start taking once daily if you didn't have a stool the day before. Patient taking differently: Take 17 g by mouth daily as needed. Mix one tablespoon with 8oz of your favorite juice or water every day until you are having soft formed stools. Then start taking once daily if you didn't have a stool the day before. 07/26/22   Willy Eddy, MD  predniSONE (DELTASONE) 50 MG tablet Take 1 tablet (50 mg total) by mouth daily with breakfast. 09/05/23   Cuthriell, Delorise Royals, PA-C      Allergies    Citrus, Sulfamethoxazole-trimethoprim, Butorphanol, Contrast media [iodinated contrast media], Ibuprofen, Ketorolac, Lorazepam, Naproxen, Prochlorperazine, and Nsaids    Review of Systems   Review of Systems  Physical Exam Updated Vital Signs BP 135/87 (BP Location: Left Arm)   Pulse (!) 101   Temp 98.4 F (36.9 C) (Oral)   Resp 20   Ht 5\' 5"  (1.651 m)   Wt 65.8 kg   LMP 10/12/2023 (Approximate)   SpO2 99%   BMI 24.13 kg/m  Physical Exam Constitutional:      Appearance: She is well-developed.  HENT:     Head: Normocephalic and atraumatic.  Nose: Nose normal.     Mouth/Throat:     Pharynx: Oropharynx is clear.  Eyes:     Conjunctiva/sclera: Conjunctivae normal.  Cardiovascular:     Rate and Rhythm: Normal rate.     Pulses: Normal pulses.     Heart sounds: Normal heart sounds.  Pulmonary:     Effort: Pulmonary effort is normal. No respiratory distress.  Abdominal:     General: Abdomen is flat. There is no distension.     Palpations: Abdomen is soft.     Tenderness: There is abdominal tenderness (epigastric). There is no right CVA tenderness, left CVA tenderness or guarding.  Musculoskeletal:        General: Normal range of motion.     Cervical back: Normal  range of motion.  Skin:    General: Skin is warm.     Findings: No rash.  Neurological:     General: No focal deficit present.     Mental Status: She is alert and oriented to person, place, and time. Mental status is at baseline.     Cranial Nerves: No cranial nerve deficit.     Motor: No weakness.     Gait: Gait normal.  Psychiatric:        Behavior: Behavior normal.        Thought Content: Thought content normal.     ED Results / Procedures / Treatments   Labs (all labs ordered are listed, but only abnormal results are displayed) Labs Reviewed  COMPREHENSIVE METABOLIC PANEL - Abnormal; Notable for the following components:      Result Value   Glucose, Bld 111 (*)    BUN 21 (*)    All other components within normal limits  CBC - Abnormal; Notable for the following components:   Hemoglobin 11.8 (*)    All other components within normal limits  URINALYSIS, ROUTINE W REFLEX MICROSCOPIC - Abnormal; Notable for the following components:   Color, Urine YELLOW (*)    APPearance HAZY (*)    All other components within normal limits  LIPASE, BLOOD  POC URINE PREG, ED    EKG None  Radiology No results found.  Procedures Procedures    Medications Ordered in ED Medications  dicyclomine (BENTYL) 10 MG/5ML solution 10 mg (10 mg Oral Given 10/24/23 1646)  alum & mag hydroxide-simeth (MAALOX/MYLANTA) 200-200-20 MG/5ML suspension 30 mL (30 mLs Oral Given 10/24/23 1646)    ED Course/ Medical Decision Making/ A&P                                 Medical Decision Making Amount and/or Complexity of Data Reviewed Labs: ordered.  Risk OTC drugs. Prescription drug management.  53 year old female with history of cholecystectomy presents with epigastric pain, burning after eating.  She said 2 weeks of epigastric pain with intermittent nausea and every few days she will have an episode of vomiting.  She denies NSAID or alcohol use.  Patient has been afebrile.  Bowels are moving  well.  No urinary symptoms.  Patient CBC CMP lipase and urinalysis all within normal limits.  Her abdomen is soft nontender nondistended.  History and exam consistent with peptic ulcer disease.  Patient given GI cocktail here in the emergency department which did resolve her pain and discomfort.  Recommend sucralfate and omeprazole.  She will follow-up with GI.  She is given information on PUD diet.  She is given strict return precautions and  understands signs symptoms return to the ER for.  Final Clinical Impression(s) / ED Diagnoses Final diagnoses:  PUD (peptic ulcer disease)    Rx / DC Orders ED Discharge Orders          Ordered    omeprazole (PRILOSEC OTC) 20 MG tablet  Daily        10/24/23 1608    sucralfate (CARAFATE) 1 GM/10ML suspension  4 times daily        10/24/23 1608              Ronnette Juniper 10/24/23 1732    Phineas Semen, MD 10/25/23 1944

## 2023-10-24 NOTE — ED Triage Notes (Signed)
 Pt to ED for 2 weeks of upper "agonizing" pain that is constant since 2 weeks. Everything makes it hurt except cool liquids. No urinary symptoms. Normal BM's 3 times/day which is her norm. Does not have GB. Has been under stress. Vomiting "a cup and a half every 3 days". No urinary symptoms. Pt staying in hotel, was just evicted.

## 2023-12-17 ENCOUNTER — Emergency Department: Admission: EM | Admit: 2023-12-17 | Discharge: 2023-12-17 | Disposition: A | Discharge status: Home / Self Care

## 2023-12-17 ENCOUNTER — Other Ambulatory Visit: Payer: Self-pay

## 2023-12-17 DIAGNOSIS — M545 Low back pain, unspecified: Secondary | ICD-10-CM | POA: Insufficient documentation

## 2023-12-17 DIAGNOSIS — M549 Dorsalgia, unspecified: Secondary | ICD-10-CM | POA: Diagnosis present

## 2023-12-17 DIAGNOSIS — G8929 Other chronic pain: Secondary | ICD-10-CM | POA: Diagnosis not present

## 2023-12-17 DIAGNOSIS — M5459 Other low back pain: Secondary | ICD-10-CM | POA: Diagnosis not present

## 2023-12-17 MED ORDER — OXYCODONE-ACETAMINOPHEN 5-325 MG PO TABS
1.0000 | ORAL_TABLET | Freq: Once | ORAL | Status: AC
  Administered 2023-12-17: 1 via ORAL
  Filled 2023-12-17: qty 1

## 2023-12-17 MED ORDER — CYCLOBENZAPRINE HCL 5 MG PO TABS: 5.0000 mg | ORAL_TABLET | Freq: Three times a day (TID) | ORAL | 0 refills | Status: DC | PRN

## 2023-12-17 MED ORDER — OXYCODONE-ACETAMINOPHEN 7.5-325 MG PO TABS: 1.0000 | ORAL_TABLET | ORAL | 0 refills | Status: DC | PRN

## 2023-12-17 NOTE — ED Triage Notes (Signed)
 Pt arrives via POV with c/o lower back pain. Pt states that pain is worse than normal, and started 3 days ago. Pt suffers from chronic back pain and nothing they have tried has relived the symptoms. Pt is able to walk from lobby to triage room unassisted from staff. Pt is A&Ox4.

## 2023-12-17 NOTE — ED Notes (Signed)
 Pt came out of room and said  " be right back" when asked where she was going pt stated "going to get something to eat" pt advised she needed to stay in her room that the provider would be in to see her. Pt went back to her room.

## 2023-12-17 NOTE — Discharge Instructions (Signed)
 Follow up with your primary care provider. Do not take tylenol  with medication prescribed

## 2023-12-17 NOTE — ED Provider Notes (Signed)
 [facility] Emergency Department Provider Note     Event Date/Time   First MD Initiated Contact with Patient 12/17/23 1352     (approximate)   History   lower back pain   HPI  Karina Anderson is a 53 y.o. female with a history of bipolar affective disorder and chronic back pain presents to the ED with complaint of acute on chronic lower back pain without injury x 3 days ago.  No radiation.  Denies fever and urinary symptoms.     Physical Exam   Triage Vital Signs: ED Triage Vitals  Encounter Vitals Group     BP 12/17/23 1344 114/89     Systolic BP Percentile --      Diastolic BP Percentile --      Pulse Rate 12/17/23 1341 78     Resp 12/17/23 1341 16     Temp 12/17/23 1341 98.1 F (36.7 C)     Temp Source 12/17/23 1341 Oral     SpO2 12/17/23 1341 100 %     Weight 12/17/23 1346 145 lb (65.8 kg)     Height 12/17/23 1346 5\' 5"  (1.651 m)     Head Circumference --      Peak Flow --      Pain Score 12/17/23 1344 10     Pain Loc --      Pain Education --      Exclude from Growth Chart --     Most recent vital signs: Vitals:   12/17/23 1341 12/17/23 1344  BP:  114/89  Pulse: 78   Resp: 16   Temp: 98.1 F (36.7 C)   SpO2: 100%    General: Well appearing. Alert and oriented. INAD.  Skin:  Warm, dry and intact. No rashes or lesions noted.     Head:  NCAT.  Eyes:  PERRLA. EOMI.  CV:  Good peripheral perfusion. RRR.  RESP:  Normal effort. LCTAB.  ABD:  No distention. No CVA tenderness bilaterally.  BACK:  Spinous process is midline without deformity or tenderness. Mild tenderness to bilateral lower back. MSK:   Full ROM in all joints.  NEURO: Cranial nerves intact. No focal deficits. Sensation and motor function intact. 5/5 muscle strength of LE. Gait is steady.   ED Results / Procedures / Treatments   Labs (all labs ordered are listed, but only abnormal results are displayed) Labs Reviewed - No data to display  No results  found.  PROCEDURES:  Critical Care performed: No  Procedures   MEDICATIONS ORDERED IN ED: Medications  oxyCODONE -acetaminophen  (PERCOCET/ROXICET) 5-325 MG per tablet 1 tablet (1 tablet Oral Given 12/17/23 1430)     IMPRESSION / MDM / ASSESSMENT AND PLAN / ED COURSE  I reviewed the triage vital signs and the nursing notes.                               53 y.o. female presents to the emergency department for evaluation and treatment of acute on chronic low back pain without injury x 3 days. See HPI for further details.   Differential diagnosis includes, but is not limited to muscle spasm, chronic syndrome, strain  Patient's presentation is most consistent with acute complicated illness / injury requiring diagnostic workup.  Patient is alert and oriented.  She is hemodynamically stable.  Physical exam findings as stated above.  No red flag signs noted.  Neurovascular status is intact.  No  indication for imaging at this time.  Patient has allergies to NSAIDs/anti-inflammatory.  Patient reports prior relief of her pain with Percocet.   PDMP reviewed. Review of the [place] CSRS was performed in accordance of the [facility] prior to dispensing any controlled drugs.  Will send short course of this medication however a thorough discussion of follow-up with primary care was had with the patient which she verbalized.  Muscle relaxer sent to pharmacy as well.  Patient is stable in satisfactory condition for discharge home.  ED return precautions discussed.  FINAL CLINICAL IMPRESSION(S) / ED DIAGNOSES   Final diagnoses:  Chronic bilateral back pain, unspecified back location   Rx / DC Orders   ED Discharge Orders          Ordered    oxyCODONE -acetaminophen  (PERCOCET) 7.5-325 MG tablet  Every 4 hours PRN        12/17/23 1426    cyclobenzaprine  (FLEXERIL ) 5 MG tablet  3 times daily PRN        12/17/23 1426    Ambulatory Referral to Primary Care (Establish Care)        12/17/23 1427              Note:  This document was prepared using Dragon voice recognition software and may include unintentional dictation errors.    Alvaro Augusta A, PA-C 12/17/23 1605    Collis Deaner, MD 12/17/23 [id number]

## 2023-12-17 NOTE — ED Notes (Addendum)
 On way to pt room, pt asked if they cafeteria was open and then states that they "are going to work their way down to the cafeteria because I need a burger. I always get me something and bring it back with me." Pt also requested a remote as soon as they entered the treatment room.

## 2023-12-25 ENCOUNTER — Encounter: Payer: 59 | Admitting: Internal Medicine

## 2023-12-25 NOTE — Progress Notes (Deleted)
 Subjective:    Patient ID: Karina Anderson, female    DOB: Jan 26, 1971, 53 y.o.   MRN: [id number]  HPI  Patient presents the clinic today for her annual exam.  Flu: Never Tetanus: 08/2016 COVID: Never Shingrix: Never Pap smear: 12/2020 Mammogram: >2 years ago Colon screening: 06/2023 Vision screening: Dentist:  Diet: Exercise:  Review of Systems   Past Medical History:  Diagnosis Date   Anemia    Arthritis    Bipolar disorder (HCC)    Chronic lower back pain    Chronic pain disorder    GERD (gastroesophageal reflux disease)    History of kidney stones    History of psychosis    History of suicide attempt    Hydradenitis    Involuntary commitment    Marijuana use    Seizures (HCC)    last was 2018   Syphilis    Tobacco use    Ventral hernia     Current Outpatient Medications  Medication Sig Dispense Refill   cyclobenzaprine  (FLEXERIL ) 5 MG tablet Take 1 tablet (5 mg total) by mouth 3 (three) times daily as needed. 30 tablet 0   gabapentin  (NEURONTIN ) 300 MG capsule Take 300 mg by mouth 3 (three) times daily.     methocarbamol  (ROBAXIN ) 500 MG tablet Take 1 tablet (500 mg total) by mouth 4 (four) times daily. 16 tablet 0   omeprazole  (PRILOSEC  OTC) 20 MG tablet Take 2 tablets (40 mg total) by mouth daily. 60 tablet 0   ondansetron  (ZOFRAN ) 4 MG tablet Take 1 tablet (4 mg total) by mouth daily as needed. 15 tablet 0   oxyCODONE -acetaminophen  (PERCOCET) 7.5-325 MG tablet Take 1 tablet by mouth every 4 (four) hours as needed for severe pain (pain score 7-10). 12 tablet 0   phenytoin  (DILANTIN ) 100 MG ER capsule Take 100 mg by mouth 3 (three) times daily.     polyethylene glycol (MIRALAX  / GLYCOLAX ) 17 g packet Take 17 g by mouth daily. Mix one tablespoon with 8oz of your favorite juice or water every day until you are having soft formed stools. Then start taking once daily if you didn't have a stool the day before. (Patient taking differently: Take 17 g by mouth daily as  needed. Mix one tablespoon with 8oz of your favorite juice or water every day until you are having soft formed stools. Then start taking once daily if you didn't have a stool the day before.) 30 each 0   predniSONE  (DELTASONE ) 50 MG tablet Take 1 tablet (50 mg total) by mouth daily with breakfast. 5 tablet 0   sucralfate  (CARAFATE ) 1 GM/10ML suspension Take 10 mLs (1 g total) by mouth 4 (four) times daily. 420 mL 1   No current facility-administered medications for this visit.    Allergies  Allergen Reactions   Citrus Hives   Sulfamethoxazole-Trimethoprim Hives   Butorphanol Hives   Contrast Media [Iodinated Contrast Media] Nausea And Vomiting   Ibuprofen    Ketorolac     Lorazepam  Hives   Naproxen     Prochlorperazine Hives   Nsaids Rash    Family History  Problem Relation Age of Onset   Seizures Mother    Diabetes Mother    Heart attack Father    Hypertension Brother    Eczema Daughter    Anxiety disorder Maternal Grandmother    Hypertension Brother    Hypertension Brother    Hypertension Brother    Hypertension Brother     Social History   Socioeconomic  History   Marital status: Single    Spouse name: Not on file   Number of children: Not on file   Years of education: Not on file   Highest education level: Not on file  Occupational History   Not on file  Tobacco Use   Smoking status: Every Day    Current packs/day: 0.50    Average packs/day: 0.5 packs/day for 40.0 years (20.0 ttl pk-yrs)    Types: Cigarettes   Smokeless tobacco: Never   Tobacco comments:    Has smoked since the age of 53 yrs old.  Vaping Use   Vaping status: Never Used  Substance and Sexual Activity   Alcohol use: Yes    Comment: occ   Drug use: Not Currently    Types: Marijuana    Comment: pt denies this as of 10-05-22   Sexual activity: Yes    Birth control/protection: None  Other Topics Concern   Not on file  Social History Narrative   Not on file   Social Drivers of Health    Financial Resource Strain: High Risk (08/16/2022)   Overall Financial Resource Strain (CARDIA)    Difficulty of Paying Living Expenses: Hard  Food Insecurity: No Food Insecurity (06/24/2021)   Hunger Vital Sign    Worried About Running Out of Food in the Last Year: Never true    Ran Out of Food in the Last Year: Never true  Transportation Needs: No Transportation Needs (06/24/2021)   PRAPARE - Administrator, Civil Service (Medical): No    Lack of Transportation (Non-Medical): No  Physical Activity: Sufficiently Active (06/24/2021)   Exercise Vital Sign    Days of Exercise per Week: 3 days    Minutes of Exercise per Session: 60 min  Stress: Stress Concern Present (08/16/2022)   [employer] of Occupational Health - Occupational Stress Questionnaire    Feeling of Stress : Rather much  Social Connections: Unknown (06/24/2021)   Social Connection and Isolation Panel [NHANES]    Frequency of Communication with Friends and Family: More than three times a week    Frequency of Social Gatherings with Friends and Family: More than three times a week    Attends Religious Services: Not on file    Active Member of Clubs or Organizations: Not on file    Attends Banker Meetings: Not on file    Marital Status: Never married  Intimate Partner Violence: Not At Risk (05/26/2022)   Humiliation, Afraid, Rape, and Kick questionnaire    Fear of Current or Ex-Partner: No    Emotionally Abused: No    Physically Abused: No    Sexually Abused: No     Constitutional: Denies fever, malaise, fatigue, headache or abrupt weight changes.  HEENT: Denies eye pain, eye redness, ear pain, ringing in the ears, wax buildup, runny nose, nasal congestion, bloody nose, or sore throat. Respiratory: Denies difficulty breathing, shortness of breath, cough or sputum production.   Cardiovascular: Denies chest pain, chest tightness, palpitations or swelling in the hands or feet.   Gastrointestinal: Denies abdominal pain, bloating, constipation, diarrhea or blood in the stool.  GU: Denies urgency, frequency, pain with urination, burning sensation, blood in urine, odor or discharge. Musculoskeletal: Patient reports chronic back pain.  Denies decrease in range of motion, difficulty with gait, or joint swelling.  Skin: Denies redness, rashes, lesions or ulcercations.  Neurological: Denies dizziness, difficulty with memory, difficulty with speech or problems with balance and coordination.  Psych: Denies anxiety,  depression, SI/HI.  No other specific complaints in a complete review of systems (except as listed in HPI above).      Objective:   Physical Exam  LMP 10/15/2023 (Approximate)  Wt Readings from Last 3 Encounters:  12/17/23 145 lb (65.8 kg)  10/24/23 145 lb (65.8 kg)  09/05/23 145 lb (65.8 kg)    General: Appears their stated age, well developed, well nourished in NAD. Skin: Warm, dry and intact. No rashes, lesions or ulcerations noted. HEENT: Head: normal shape and size; Eyes: sclera white, no icterus, conjunctiva pink, PERRLA and EOMs intact; Ears: Tm's gray and intact, normal light reflex; Nose: mucosa pink and moist, septum midline; Throat/Mouth: Teeth present, mucosa pink and moist, no exudate, lesions or ulcerations noted.  Neck:  Neck supple, trachea midline. No masses, lumps or thyromegaly present.  Cardiovascular: Normal rate and rhythm. S1,S2 noted.  No murmur, rubs or gallops noted. No JVD or BLE edema. No carotid bruits noted. Pulmonary/Chest: Normal effort and positive vesicular breath sounds. No respiratory distress. No wheezes, rales or ronchi noted.  Abdomen: Soft and nontender. Normal bowel sounds. No distention or masses noted. Liver, spleen and kidneys non palpable. Musculoskeletal: Normal range of motion. No signs of joint swelling. No difficulty with gait.  Neurological: Alert and oriented. Cranial nerves II-XII grossly intact.  Coordination normal.  Psychiatric: Mood and affect normal. Behavior is normal. Judgment and thought content normal.    BMET    Component Value Date/Time   NA 139 10/24/2023 1304   NA 146 (H) 12/01/2020 0954   K 3.7 10/24/2023 1304   CL 106 10/24/2023 1304   CO2 25 10/24/2023 1304   GLUCOSE 111 (H) 10/24/2023 1304   BUN 21 (H) 10/24/2023 1304   BUN 14 12/01/2020 0954   CREATININE 0.91 10/24/2023 1304   CALCIUM 9.1 10/24/2023 1304   GFRNONAA >60 10/24/2023 1304   GFRAA >60 02/24/2020 1426    Lipid Panel     Component Value Date/Time   CHOL 206 (H) 06/29/2023 1046   CHOL 249 (H) 12/31/2020 1406   TRIG 97 06/29/2023 1046   HDL 59 06/29/2023 1046   HDL 49 12/31/2020 1406   CHOLHDL 3.5 06/29/2023 1046   VLDL 15 03/12/2016 0658   LDLCALC 127 (H) 06/29/2023 1046    CBC    Component Value Date/Time   WBC 5.0 10/24/2023 1304   RBC 4.10 10/24/2023 1304   HGB 11.8 (L) 10/24/2023 1304   HGB 11.8 12/01/2020 0954   HCT 37.5 10/24/2023 1304   HCT 36.1 12/01/2020 0954   PLT 260 10/24/2023 1304   PLT 319 12/01/2020 0954   MCV 91.5 10/24/2023 1304   MCV 85 12/01/2020 0954   MCH 28.8 10/24/2023 1304   MCHC 31.5 10/24/2023 1304   RDW 13.1 10/24/2023 1304   RDW 14.0 12/01/2020 0954   LYMPHSABS 1.6 06/21/2023 1936   LYMPHSABS 1.6 12/01/2020 0954   MONOABS 0.5 06/21/2023 1936   EOSABS 0.1 06/21/2023 1936   EOSABS 0.1 12/01/2020 0954   BASOSABS 0.0 06/21/2023 1936   BASOSABS 0.0 12/01/2020 0954    Hgb A1C Lab Results  Component Value Date   HGBA1C 5.3 06/29/2023            Assessment & Plan:  Preventative health maintenance:  Encouraged her to get a flu shot in the fall Tetanus UTD Encouraged her to get her COVID-vaccine Discussed Shingrix vaccine, she will check coverage with her insurance company and schedule visit if she would like to have  this done Pap smear Mammogram ordered-she will call to schedule Colon screening UTD Encouraged her to consume a  balanced diet and exercise regimen Advised her to see an eye doctor and dentist annually Will check CBC, c-Met, lipid, A1c today  RTC in 6 months for follow-up of chronic conditions Helayne Lo, NP

## 2024-01-14 ENCOUNTER — Emergency Department
Admission: EM | Admit: 2024-01-14 | Discharge: 2024-01-14 | Disposition: A | Discharge status: Home / Self Care | Attending: Emergency Medicine | Admitting: Emergency Medicine

## 2024-01-14 ENCOUNTER — Other Ambulatory Visit: Payer: Self-pay

## 2024-01-14 ENCOUNTER — Encounter: Payer: Self-pay | Admitting: Intensive Care

## 2024-01-14 DIAGNOSIS — R000 Tachycardia, unspecified: Secondary | ICD-10-CM | POA: Insufficient documentation

## 2024-01-14 DIAGNOSIS — R350 Frequency of micturition: Secondary | ICD-10-CM | POA: Diagnosis present

## 2024-01-14 DIAGNOSIS — N12 Tubulo-interstitial nephritis, not specified as acute or chronic: Secondary | ICD-10-CM | POA: Diagnosis not present

## 2024-01-14 DIAGNOSIS — N10 Acute tubulo-interstitial nephritis: Secondary | ICD-10-CM | POA: Insufficient documentation

## 2024-01-14 DIAGNOSIS — M545 Low back pain, unspecified: Secondary | ICD-10-CM | POA: Diagnosis not present

## 2024-01-14 LAB — CBC WITH DIFFERENTIAL/PLATELET
Abs Immature Granulocytes: 0.01 10*3/uL (ref 0.00–0.07)
Basophils Absolute: 0 10*3/uL (ref 0.0–0.1)
Basophils Relative: 0 %
Eosinophils Absolute: 0 10*3/uL (ref 0.0–0.5)
Eosinophils Relative: 1 %
HCT: 40.3 % (ref 36.0–46.0)
Hemoglobin: 13 g/dL (ref 12.0–15.0)
Immature Granulocytes: 0 %
Lymphocytes Relative: 19 %
Lymphs Abs: 1.1 10*3/uL (ref 0.7–4.0)
MCH: 28.3 pg (ref 26.0–34.0)
MCHC: 32.3 g/dL (ref 30.0–36.0)
MCV: 87.6 fL (ref 80.0–100.0)
Monocytes Absolute: 0.6 10*3/uL (ref 0.1–1.0)
Monocytes Relative: 10 %
Neutro Abs: 4.1 10*3/uL (ref 1.7–7.7)
Neutrophils Relative %: 70 %
Platelets: 287 10*3/uL (ref 150–400)
RBC: 4.6 MIL/uL (ref 3.87–5.11)
RDW: 13.4 % (ref 11.5–15.5)
WBC: 5.9 10*3/uL (ref 4.0–10.5)
nRBC: 0 % (ref 0.0–0.2)

## 2024-01-14 LAB — COMPREHENSIVE METABOLIC PANEL WITH GFR
ALT: 12 U/L (ref 0–44)
AST: 20 U/L (ref 15–41)
Albumin: 4.3 g/dL (ref 3.5–5.0)
Alkaline Phosphatase: 65 U/L (ref 38–126)
Anion gap: 11 (ref 5–15)
BUN: 16 mg/dL (ref 6–20)
CO2: 29 mmol/L (ref 22–32)
Calcium: 10.2 mg/dL (ref 8.9–10.3)
Chloride: 98 mmol/L (ref 98–111)
Creatinine, Ser: 0.83 mg/dL (ref 0.44–1.00)
GFR, Estimated: >60 mL/min (ref >60)
Glucose, Bld: 121 mg/dL — ABNORMAL HIGH (ref 70–99)
Potassium: 3.6 mmol/L (ref 3.5–5.1)
Sodium: 138 mmol/L (ref 135–145)
Total Bilirubin: 1.4 mg/dL — ABNORMAL HIGH (ref 0.0–1.2)
Total Protein: 8.2 g/dL — ABNORMAL HIGH (ref 6.5–8.1)

## 2024-01-14 LAB — URINALYSIS, ROUTINE W REFLEX MICROSCOPIC
Bilirubin Urine: NEGATIVE
Glucose, UA: NEGATIVE mg/dL
Ketones, ur: NEGATIVE mg/dL
Nitrite: POSITIVE — AB
Protein, ur: 100 mg/dL — AB
Specific Gravity, Urine: 1.017 (ref 1.005–1.030)
WBC, UA: >50 WBC/hpf (ref 0–5)
pH: 6 (ref 5.0–8.0)

## 2024-01-14 LAB — POC URINE PREG, ED: Preg Test, Ur: NEGATIVE

## 2024-01-14 MED ORDER — SODIUM CHLORIDE 0.9 % IV BOLUS
1000.0000 mL | Freq: Once | INTRAVENOUS | Status: AC
  Administered 2024-01-14: 1000 mL via INTRAVENOUS

## 2024-01-14 MED ORDER — OXYCODONE HCL 5 MG PO TABS
5.0000 mg | ORAL_TABLET | Freq: Once | ORAL | Status: AC
  Administered 2024-01-14: 5 mg via ORAL
  Filled 2024-01-14: qty 1

## 2024-01-14 MED ORDER — SODIUM CHLORIDE 0.9 % IV SOLN
1.0000 g | Freq: Once | INTRAVENOUS | Status: AC
  Administered 2024-01-14: 1 g via INTRAVENOUS
  Filled 2024-01-14: qty 10

## 2024-01-14 MED ORDER — CEFDINIR 300 MG PO CAPS: 300.0000 mg | ORAL_CAPSULE | Freq: Two times a day (BID) | ORAL | 0 refills | Status: AC

## 2024-01-14 MED ORDER — PHENAZOPYRIDINE HCL 100 MG PO TABS: 100.0000 mg | ORAL_TABLET | Freq: Three times a day (TID) | ORAL | 0 refills | Status: DC | PRN

## 2024-01-14 NOTE — ED Provider Notes (Signed)
 [facility] Provider Note    Event Date/Time   First MD Initiated Contact with Patient 01/14/24 1548     (approximate)   History   Urinary Frequency   HPI  Karina Anderson is a 53 y.o. female with history of seizures, chronic back pain, GERD, bipolar and as listed in EMR presents to the emergency department for treatment and evaluation of pelvic pain, low back pain, urinary frequency and burning for the past 2 days.  No known fever.    Physical Exam   Triage Vital Signs: ED Triage Vitals  Encounter Vitals Group     BP 01/14/24 1529 (!) 130/97     Systolic BP Percentile --      Diastolic BP Percentile --      Pulse Rate 01/14/24 1525 (!) 120     Resp 01/14/24 1525 16     Temp 01/14/24 1525 98.7 F (37.1 C)     Temp Source 01/14/24 1525 Oral     SpO2 01/14/24 1525 100 %     Weight 01/14/24 1526 145 lb (65.8 kg)     Height 01/14/24 1526 5\' 5"  (1.651 m)     Head Circumference --      Peak Flow --      Pain Score 01/14/24 1526 10     Pain Loc --      Pain Education --      Exclude from Growth Chart --     Most recent vital signs: Vitals:   01/14/24 1525 01/14/24 1529  BP:  (!) 130/97  Pulse: (!) 120   Resp: 16   Temp: 98.7 F (37.1 C)   SpO2: 100%     General: Awake, no distress.  CV:  Good peripheral perfusion.  Resp:  Normal effort.  Abd:  No distention. Soft. No rebound tenderness. Other:  No CVA tenderness.    ED Results / Procedures / Treatments   Labs (all labs ordered are listed, but only abnormal results are displayed) Labs Reviewed  URINALYSIS, ROUTINE W REFLEX MICROSCOPIC - Abnormal; Notable for the following components:      Result Value   Color, Urine AMBER (*)    APPearance CLOUDY (*)    Hgb urine dipstick SMALL (*)    Protein, ur 100 (*)    Nitrite POSITIVE (*)    Leukocytes,Ua MODERATE (*)    Bacteria, UA MANY (*)    Non Squamous Epithelial PRESENT (*)    All other components within normal limits   COMPREHENSIVE METABOLIC PANEL WITH GFR - Abnormal; Notable for the following components:   Glucose, Bld 121 (*)    Total Protein 8.2 (*)    Total Bilirubin 1.4 (*)    All other components within normal limits  URINE CULTURE  CBC WITH DIFFERENTIAL/PLATELET  POC URINE PREG, ED     EKG  Not indicated.   RADIOLOGY  Not indicated.  PROCEDURES:  Critical Care performed: No  Procedures   MEDICATIONS ORDERED IN ED:  Medications  sodium chloride  0.9 % bolus 1,000 mL (0 mLs Intravenous Stopped 01/14/24 1839)  cefTRIAXone  (ROCEPHIN ) 1 g in sodium chloride  0.9 % 100 mL IVPB (0 g Intravenous Stopped 01/14/24 1839)  oxyCODONE  (Oxy IR/ROXICODONE ) immediate release tablet 5 mg (5 mg Oral Given 01/14/24 1754)     IMPRESSION / MDM / ASSESSMENT AND PLAN / ED COURSE   I have reviewed the triage note.  Differential diagnosis includes, but is not limited to, acute cystitis; pyelonephritis; kidney stone  Patient's presentation is most consistent with acute illness / injury with system symptoms.  53 year old female presenting to the emergency department for treatment and evaluation of suprapubic pain that radiates around into both sides of her back.  Similar symptoms in the past with pyelonephritis.  No known fever.  Vital signs indicate that she is slightly tachycardic at 120.  Heart rate rechecked at bedside by me and found to be a rate of 112.  Plan will be to get labs and give her some IV fluids and antibiotics. I do not suspect sepsis.  Urinalysis is consistent with acute cystitis/pyelonephritis.  She has many bacteria, greater than 50 white blood cells, moderate leukocytes, positive for nitrates, 100 of protein, white blood cell clumps.  Results discussed with the patient.  Plan will be to discharge her home with antibiotics and Pyridium .  Outpatient follow-up and ER return precautions were discussed.       FINAL CLINICAL IMPRESSION(S) / ED DIAGNOSES   Final diagnoses:   Pyelonephritis     Rx / DC Orders   ED Discharge Orders          Ordered    cefdinir (OMNICEF) 300 MG capsule  2 times daily        01/14/24 1742    phenazopyridine  (PYRIDIUM ) 100 MG tablet  3 times daily PRN        01/14/24 1742             Note:  This document was prepared using Dragon voice recognition software and may include unintentional dictation errors.   Sherryle Don, FNP 01/14/24 Dusty Gin    Arline Bennett, MD 01/14/24 [phone]

## 2024-01-14 NOTE — Discharge Instructions (Signed)
 Take the antibiotic as prescribed until finished.  The Pyridium  should help with your pain.  Be aware that it will make your urine turn bright orange and this is normal.  If you are not improving over the next 2 to 3 days, please follow-up with your primary care provider.  If your symptoms change or worsen despite taking medication, please return to the emergency department if you are unable to schedule an appointment with primary care.

## 2024-01-14 NOTE — ED Triage Notes (Signed)
 Pt c/o urinary frequency and burning X2 days

## 2024-01-16 LAB — URINE CULTURE: Culture: >=100000 — AB

## 2024-01-26 ENCOUNTER — Emergency Department
Admission: EM | Admit: 2024-01-26 | Discharge: 2024-01-26 | Disposition: A | Discharge status: Home / Self Care | Attending: Emergency Medicine | Admitting: Emergency Medicine

## 2024-01-26 ENCOUNTER — Emergency Department

## 2024-01-26 DIAGNOSIS — R0789 Other chest pain: Secondary | ICD-10-CM | POA: Insufficient documentation

## 2024-01-26 DIAGNOSIS — M25512 Pain in left shoulder: Secondary | ICD-10-CM | POA: Insufficient documentation

## 2024-01-26 DIAGNOSIS — R0602 Shortness of breath: Secondary | ICD-10-CM | POA: Insufficient documentation

## 2024-01-26 DIAGNOSIS — T148XXA Other injury of unspecified body region, initial encounter: Secondary | ICD-10-CM

## 2024-01-26 DIAGNOSIS — M545 Low back pain, unspecified: Secondary | ICD-10-CM | POA: Diagnosis not present

## 2024-01-26 DIAGNOSIS — S29011A Strain of muscle and tendon of front wall of thorax, initial encounter: Secondary | ICD-10-CM | POA: Diagnosis not present

## 2024-01-26 DIAGNOSIS — S46912A Strain of unspecified muscle, fascia and tendon at shoulder and upper arm level, left arm, initial encounter: Secondary | ICD-10-CM | POA: Diagnosis not present

## 2024-01-26 LAB — BASIC METABOLIC PANEL WITH GFR
Anion gap: 8 (ref 5–15)
BUN: 15 mg/dL (ref 6–20)
CO2: 28 mmol/L (ref 22–32)
Calcium: 9.5 mg/dL (ref 8.9–10.3)
Chloride: 102 mmol/L (ref 98–111)
Creatinine, Ser: 0.91 mg/dL (ref 0.44–1.00)
GFR, Estimated: >60 mL/min (ref >60)
Glucose, Bld: 97 mg/dL (ref 70–99)
Potassium: 3.7 mmol/L (ref 3.5–5.1)
Sodium: 138 mmol/L (ref 135–145)

## 2024-01-26 LAB — CBC
HCT: 38.3 % (ref 36.0–46.0)
Hemoglobin: 12.3 g/dL (ref 12.0–15.0)
MCH: 28.4 pg (ref 26.0–34.0)
MCHC: 32.1 g/dL (ref 30.0–36.0)
MCV: 88.5 fL (ref 80.0–100.0)
Platelets: 305 10*3/uL (ref 150–400)
RBC: 4.33 MIL/uL (ref 3.87–5.11)
RDW: 13.2 % (ref 11.5–15.5)
WBC: 5.2 10*3/uL (ref 4.0–10.5)
nRBC: 0 % (ref 0.0–0.2)

## 2024-01-26 LAB — TROPONIN I (HIGH SENSITIVITY): Troponin I (High Sensitivity): 4 ng/L (ref <18)

## 2024-01-26 MED ORDER — CYCLOBENZAPRINE HCL 10 MG PO TABS
10.0000 mg | ORAL_TABLET | Freq: Once | ORAL | Status: AC
  Administered 2024-01-26: 10 mg via ORAL
  Filled 2024-01-26: qty 1

## 2024-01-26 MED ORDER — CYCLOBENZAPRINE HCL 10 MG PO TABS: 10.0000 mg | ORAL_TABLET | Freq: Three times a day (TID) | ORAL | 0 refills | Status: DC | PRN

## 2024-01-26 MED ORDER — OXYCODONE-ACETAMINOPHEN 5-325 MG PO TABS
1.0000 | ORAL_TABLET | Freq: Once | ORAL | Status: AC
  Administered 2024-01-26: 1 via ORAL
  Filled 2024-01-26: qty 1

## 2024-01-26 MED ORDER — OXYCODONE-ACETAMINOPHEN 5-325 MG PO TABS: 1.0000 | ORAL_TABLET | ORAL | 0 refills | Status: AC | PRN

## 2024-01-26 NOTE — ED Provider Notes (Signed)
 [facility] Provider Note    Event Date/Time   First MD Initiated Contact with Patient 01/26/24 [phone]     (approximate)   History   Chest Pain   HPI  Karina Anderson is a 53 y.o. female with history of seizure disorder, chronic back pain, GERD, and bipolar disorder who presents with shoulder, back, and chest pain over the last 4 days.  The patient states that she believes she slept on her left shoulder wrong.  She started to have pain radiating down from the left shoulder to her lower back and then up to her chest.  Is associated with mild shortness of breath.  The patient denies feeling dizzy or lightheaded.  She has no nausea or vomiting.  She denies any urinary symptoms.  She denies any weakness or numbness in the arm.  She has no pain in the middle of her spine.  She denies any trauma or injury.  I the past medical records.  The patient was seen in the ED on 5/18 with pelvic and low back pain and urinary symptoms; she was diagnosed with UTI and treated with cefdinir .  Previously she was seen on 4/20 with back pain.  Her most recent documented outpatient encounter was with GI in November of last year for colonoscopy.   Physical Exam   Triage Vital Signs: ED Triage Vitals  Encounter Vitals Group     BP 01/26/24 0751 (!) 123/100     Systolic BP Percentile --      Diastolic BP Percentile --      Pulse Rate 01/26/24 0751 96     Resp 01/26/24 0751 18     Temp 01/26/24 0751 98 F (36.7 C)     Temp Source 01/26/24 0751 Oral     SpO2 01/26/24 0751 100 %     Weight 01/26/24 0752 143 lb 4.8 oz (65 kg)     Height 01/26/24 0752 5\' 5"  (1.651 m)     Head Circumference --      Peak Flow --      Pain Score 01/26/24 0751 10     Pain Loc --      Pain Education --      Exclude from Growth Chart --     Most recent vital signs: Vitals:   01/26/24 1000 01/26/24 1030  BP: (!) 132/114 (!) 124/113  Pulse:    Resp: (!) 21 16  Temp:    SpO2:       General: Alert,  well-appearing, no distress.  CV:  Good peripheral perfusion.  Normal heart sounds. Resp:  Normal effort.  Lungs CTAB. Abd:  No distention.  Other:  No midline spinal tenderness.  Left thoracic and lumbar paraspinal tenderness.  Muscle tenderness to the left trapezius, shoulder, and anterior chest wall.   ED Results / Procedures / Treatments   Labs (all labs ordered are listed, but only abnormal results are displayed) Labs Reviewed  BASIC METABOLIC PANEL WITH GFR  CBC  URINALYSIS, ROUTINE W REFLEX MICROSCOPIC  POC URINE PREG, ED  TROPONIN I (HIGH SENSITIVITY)     EKG  ED ECG REPORT I, Lind Repine, the attending physician, personally viewed and interpreted this ECG.  Date: 01/26/2024 EKG Time: 0752 Rate: 99 Rhythm: normal sinus rhythm QRS Axis: normal Intervals: normal ST/T Wave abnormalities: normal Narrative Interpretation: no evidence of acute ischemia    RADIOLOGY  Chest x-ray: I independently viewed and interpreted the images; there is no focal consolidation or edema  PROCEDURES:  Critical Care performed: No  Procedures   MEDICATIONS ORDERED IN ED: Medications  oxyCODONE -acetaminophen  (PERCOCET/ROXICET) 5-325 MG per tablet 1 tablet (1 tablet Oral Given 01/26/24 0844)  cyclobenzaprine  (FLEXERIL ) tablet 10 mg (10 mg Oral Given 01/26/24 0844)     IMPRESSION / MDM / ASSESSMENT AND PLAN / ED COURSE  I reviewed the triage vital signs and the nursing notes.  53 year old female with PMH as noted above presents with left shoulder, low back, and chest pain for the last 4 days.  On exam the vital signs are normal.  The patient is overall well-appearing.  She has muscular tenderness throughout the areas where she describes the pain.  Differential diagnosis includes, but is not limited to, muscle strain or spasm, radiculopathy, other neuropathic pain, less likely ACS.  There is no clinical evidence for PE.  We will obtain labs, chest x-ray, give analgesia, and  reassess.  Patient's presentation is most consistent with acute complicated illness / injury requiring diagnostic workup.  The patient is on the cardiac monitor to evaluate for evidence of arrhythmia and/or significant heart rate changes.  ----------------------------------------- 11:08 AM on 01/26/2024 -----------------------------------------  Chest x-ray is clear.  Lab workup is unremarkable.  BMP and CBC show no acute findings.  Troponin is negative.  Given the 4-day duration of the symptoms there is no indication for repeat troponin.  On reassessment the patient is feeling much better.  She is stable for discharge home at this time.  Return precautions given, and she expresses understanding.   FINAL CLINICAL IMPRESSION(S) / ED DIAGNOSES   Final diagnoses:  Acute pain of left shoulder  Atypical chest pain  Muscle strain     Rx / DC Orders   ED Discharge Orders          Ordered    oxyCODONE -acetaminophen  (PERCOCET) 5-325 MG tablet  Every 4 hours PRN        01/26/24 1108    cyclobenzaprine  (FLEXERIL ) 10 MG tablet  3 times daily PRN        01/26/24 1108             Note:  This document was prepared using Dragon voice recognition software and may include unintentional dictation errors.    Lind Repine, MD 01/26/24 1109

## 2024-01-26 NOTE — ED Triage Notes (Signed)
 Pt presents to the ED via POV from home for central chest pain and lower back pain that started two days ago. Pt denies cardiac hx. States that her father had his first MI at 48. Pt A&Ox4 at time of triage.

## 2024-02-04 ENCOUNTER — Emergency Department
Admission: EM | Admit: 2024-02-04 | Discharge: 2024-02-04 | Disposition: A | Discharge status: Home / Self Care | Attending: Emergency Medicine | Admitting: Emergency Medicine

## 2024-02-04 DIAGNOSIS — S46312A Strain of muscle, fascia and tendon of triceps, left arm, initial encounter: Secondary | ICD-10-CM | POA: Insufficient documentation

## 2024-02-04 DIAGNOSIS — Z720 Tobacco use: Secondary | ICD-10-CM | POA: Diagnosis not present

## 2024-02-04 DIAGNOSIS — M79622 Pain in left upper arm: Secondary | ICD-10-CM | POA: Diagnosis present

## 2024-02-04 DIAGNOSIS — X58XXXA Exposure to other specified factors, initial encounter: Secondary | ICD-10-CM | POA: Diagnosis not present

## 2024-02-04 DIAGNOSIS — S46319A Strain of muscle, fascia and tendon of triceps, unspecified arm, initial encounter: Secondary | ICD-10-CM

## 2024-02-04 MED ORDER — METHOCARBAMOL 500 MG PO TABS: 500.0000 mg | ORAL_TABLET | Freq: Four times a day (QID) | ORAL | 0 refills | Status: AC

## 2024-02-04 MED ORDER — PREDNISONE 10 MG (21) PO TBPK: ORAL_TABLET | ORAL | 0 refills | Status: AC

## 2024-02-04 NOTE — ED Provider Notes (Signed)
 [facility] Provider Note    Event Date/Time   First MD Initiated Contact with Patient 02/04/24 1402     (approximate)   History   Arm Injury   HPI  Karina Anderson is a 53 y.o. female presenting to the emergency department with pain in her left humerus region of her arm x 1 week.  Patient describes it as burning and worse with certain arm movements.  It does not extend into her neck or down to her hand.  Also reports she slept on her left shoulder wrong but denies any pain in her shoulder at this time. She denies chest pain, abdominal pain, neck pain, numbness, tingling, shortness of breath, and dyspnea. She did help a friend move some objects 2 weeks ago, but did not report pain at this time.  Pertinent past medical history includes seizures, bipolar disorder, chronic pain disorder, tobacco use.  Denies fall, injury.  She told me she has a hives rash with various NSAIDs; ketorolac , ibuprofen, naproxen  are listed in her chart.     I reviewed her past medical records and ED provider notes from Jan 26, 2024.  Patient was seen for shoulder, back, and chest pain; diagnosed with acute pain of left shoulder muscle strain and given Percocet and Flexeril .   Physical Exam   Triage Vital Signs: ED Triage Vitals  Encounter Vitals Group     BP 02/04/24 1354 (!) 124/90     Systolic BP Percentile --      Diastolic BP Percentile --      Pulse Rate 02/04/24 1354 (!) 105     Resp 02/04/24 1354 16     Temp 02/04/24 1354 98 F (36.7 C)     Temp Source 02/04/24 1354 Oral     SpO2 02/04/24 1354 98 %     Weight --      Height --      Head Circumference --      Peak Flow --      Pain Score 02/04/24 1353 10     Pain Loc --      Pain Education --      Exclude from Growth Chart --     Most recent vital signs: Vitals:   02/04/24 1354  BP: (!) 124/90  Pulse: (!) 105  Resp: 16  Temp: 98 F (36.7 C)  SpO2: 98%    General: Awake, no distress.  CV:  Good peripheral  perfusion. Resp:  Normal effort. CTAB. Abd:  No distention.  Other:  No obvious deformities or swelling of the left arm.  Patient is tender to palpation in her triceps region of her left arm.  She has normal ROM and 5/5 strength, but pain increases with left elbow extension. She is able to perform all neck and shoulder motions with ease.  Good sensation to b/l upper extremities. Radial pulses 2+ bilaterally.    ED Results / Procedures / Treatments   Labs (all labs ordered are listed, but only abnormal results are displayed) Labs Reviewed - No data to display   EKG     RADIOLOGY    PROCEDURES:  Critical Care performed: No  Procedures   MEDICATIONS ORDERED IN ED: Medications - No data to display   IMPRESSION / MDM / ASSESSMENT AND PLAN / ED COURSE  I reviewed the triage vital signs and the nursing notes.  Differential diagnosis includes, but is not limited to, tricep strain, cervical radiculopathy, shoulder strain, brachial plexus injury  Patient's presentation is most consistent with acute, uncomplicated illness.  Patient is a 53 year old female that presented today with left humerus pain.  Her pain was worse with elbow extension.  She had good full range of motion and 5/5 strength on bilateral upper extremities as well as good sensation. Pain was reproducible with certain movements. No fall or injury or obvious deformity, less concerned for fracture and did not think an x-ray was warranted at the time.  Top differential diagnosis includes tricep strain versus a cervical radiculopathy.  Patient's allergies include various NSAIDs, all hives/rash.  She was prescribed methocarbamol  and prednisone  to help with her pain.  I did discuss with her that if this is a musculoskeletal strain, it will take up to 4 to 6 weeks to heal fully.  She can follow-up with her primary care provider as needed or for any new or concerning symptoms.  Emergency department  return precautions were discussed with the patient.  Patient is in agreement to the treatment plan.  Patient is stable for discharge.    FINAL CLINICAL IMPRESSION(S) / ED DIAGNOSES   Final diagnoses:  Triceps strain, initial encounter     Rx / DC Orders   ED Discharge Orders          Ordered    predniSONE  (STERAPRED UNI-PAK 21 TAB) 10 MG (21) TBPK tablet        02/04/24 1455    methocarbamol  (ROBAXIN ) 500 MG tablet  4 times daily        02/04/24 1455             Note:  This document was prepared using Dragon voice recognition software and may include unintentional dictation errors.    Thomasenia Flesher, PA-C 02/04/24 1524    Bryson Carbine, MD 02/04/24 [phone]

## 2024-02-04 NOTE — ED Triage Notes (Signed)
 Pt c/o L humeral pain, similar to pain she was seen for approximately one week ago. Pt denies new injury.

## 2024-02-04 NOTE — Discharge Instructions (Addendum)
 We believe that your symptoms are caused by musculoskeletal strain.  Please read through the included information about additional care such as heating pads, over-the-counter pain medicine.  If you were provided a prescription please use it only as needed and as instructed.  Remember that early mobility and using the affected part of your body is actually better than keeping it immobile. This injury may take 4-6 weeks to fully heal.  Follow-up with the doctor listed as recommended or return to the emergency department with new or worsening symptoms that concern you.

## 2024-03-22 ENCOUNTER — Emergency Department
Admission: EM | Admit: 2024-03-22 | Discharge: 2024-03-22 | Disposition: A | Discharge status: Home / Self Care | Attending: Emergency Medicine | Admitting: Emergency Medicine

## 2024-03-22 ENCOUNTER — Other Ambulatory Visit: Payer: Self-pay

## 2024-03-22 DIAGNOSIS — R1030 Lower abdominal pain, unspecified: Secondary | ICD-10-CM

## 2024-03-22 DIAGNOSIS — N390 Urinary tract infection, site not specified: Secondary | ICD-10-CM | POA: Diagnosis not present

## 2024-03-22 LAB — COMPREHENSIVE METABOLIC PANEL WITH GFR
ALT: 6 U/L (ref 0–44)
AST: 13 U/L — ABNORMAL LOW (ref 15–41)
Albumin: 3.4 g/dL — ABNORMAL LOW (ref 3.5–5.0)
Alkaline Phosphatase: 65 U/L (ref 38–126)
Anion gap: 11 (ref 5–15)
BUN: 13 mg/dL (ref 6–20)
CO2: 28 mmol/L (ref 22–32)
Calcium: 9.8 mg/dL (ref 8.9–10.3)
Chloride: 98 mmol/L (ref 98–111)
Creatinine, Ser: 0.91 mg/dL (ref 0.44–1.00)
GFR, Estimated: >60 mL/min (ref >60)
Glucose, Bld: 134 mg/dL — ABNORMAL HIGH (ref 70–99)
Potassium: 3.3 mmol/L — ABNORMAL LOW (ref 3.5–5.1)
Sodium: 137 mmol/L (ref 135–145)
Total Bilirubin: 0.7 mg/dL (ref 0.0–1.2)
Total Protein: 7.6 g/dL (ref 6.5–8.1)

## 2024-03-22 LAB — URINALYSIS, ROUTINE W REFLEX MICROSCOPIC
Bilirubin Urine: NEGATIVE
Glucose, UA: NEGATIVE mg/dL
Hgb urine dipstick: NEGATIVE
Ketones, ur: 5 mg/dL — AB
Nitrite: POSITIVE — AB
Protein, ur: 30 mg/dL — AB
Specific Gravity, Urine: 1.016 (ref 1.005–1.030)
WBC, UA: >50 WBC/hpf (ref 0–5)
pH: 5 (ref 5.0–8.0)

## 2024-03-22 LAB — CBC
HCT: 38 % (ref 36.0–46.0)
Hemoglobin: 12.6 g/dL (ref 12.0–15.0)
MCH: 27.9 pg (ref 26.0–34.0)
MCHC: 33.2 g/dL (ref 30.0–36.0)
MCV: 84.3 fL (ref 80.0–100.0)
Platelets: 317 K/uL (ref 150–400)
RBC: 4.51 MIL/uL (ref 3.87–5.11)
RDW: 14 % (ref 11.5–15.5)
WBC: 7.1 K/uL (ref 4.0–10.5)
nRBC: 0 % (ref 0.0–0.2)

## 2024-03-22 LAB — LIPASE, BLOOD: Lipase: 21 U/L (ref 11–51)

## 2024-03-22 MED ORDER — OXYCODONE-ACETAMINOPHEN 5-325 MG PO TABS
1.0000 | ORAL_TABLET | Freq: Once | ORAL | Status: AC
  Administered 2024-03-22: 1 via ORAL
  Filled 2024-03-22: qty 1

## 2024-03-22 MED ORDER — ONDANSETRON 4 MG PO TBDP: 4.0000 mg | ORAL_TABLET | Freq: Three times a day (TID) | ORAL | 0 refills | Status: DC | PRN

## 2024-03-22 MED ORDER — CEPHALEXIN 500 MG PO CAPS: 500.0000 mg | ORAL_CAPSULE | Freq: Four times a day (QID) | ORAL | 0 refills | Status: AC

## 2024-03-22 MED ORDER — CEPHALEXIN 500 MG PO CAPS
500.0000 mg | ORAL_CAPSULE | Freq: Once | ORAL | Status: AC
  Administered 2024-03-22: 500 mg via ORAL
  Filled 2024-03-22: qty 1

## 2024-03-22 NOTE — ED Triage Notes (Signed)
 C/O lower abd pain and  lower back pain.  Last BM yesterday. Laxative taken yesterday.

## 2024-03-22 NOTE — ED Provider Notes (Signed)
 [facility] Provider Note    Event Date/Time   First MD Initiated Contact with Patient 03/22/24 2016     (approximate)   History   Chief Complaint Abdominal Pain   HPI  Karina Anderson is a 53 y.o. female with past medical history of hyperlipidemia, seizures, bipolar disorder, and chronic back pain who presents to the ED complaining of abdominal pain.  Patient reports that she has had 3 days of worsening pain in her suprapubic area.  She denies any associated fevers or flank pain, has been feeling nauseous with constipation, but denies any vomiting.  She denies any dysuria, hematuria, vaginal bleeding, or discharge.  She is postmenopausal.     Physical Exam   Triage Vital Signs: ED Triage Vitals [03/22/24 1727]  Encounter Vitals Group     BP 111/86     Girls Systolic BP Percentile      Girls Diastolic BP Percentile      Boys Systolic BP Percentile      Boys Diastolic BP Percentile      Pulse Rate (!) 127     Resp 16     Temp 99.2 F (37.3 C)     Temp Source Oral     SpO2 100 %     Weight      Height      Head Circumference      Peak Flow      Pain Score      Pain Loc      Pain Education      Exclude from Growth Chart     Most recent vital signs: Vitals:   03/22/24 1727 03/22/24 2040  BP: 111/86 90/75  Pulse: (!) 127 (!) 113  Resp: 16 16  Temp: 99.2 F (37.3 C) 98.6 F (37 C)  SpO2: 100% 100%    Constitutional: Alert and oriented. Eyes: Conjunctivae are normal. Head: Atraumatic. Nose: No congestion/rhinnorhea. Mouth/Throat: Mucous membranes are moist.  Cardiovascular: Normal rate, regular rhythm. Grossly normal heart sounds.  2+ radial pulses bilaterally. Respiratory: Normal respiratory effort.  No retractions. Lungs CTAB. Gastrointestinal: Soft and mildly tender to palpation in the suprapubic area. No distention. Musculoskeletal: No lower extremity tenderness nor edema.  Neurologic:  Normal speech and language. No gross focal  neurologic deficits are appreciated.    ED Results / Procedures / Treatments   Labs (all labs ordered are listed, but only abnormal results are displayed) Labs Reviewed  COMPREHENSIVE METABOLIC PANEL WITH GFR - Abnormal; Notable for the following components:      Result Value   Potassium 3.3 (*)    Glucose, Bld 134 (*)    Albumin 3.4 (*)    AST 13 (*)    All other components within normal limits  URINALYSIS, ROUTINE W REFLEX MICROSCOPIC - Abnormal; Notable for the following components:   Color, Urine AMBER (*)    APPearance HAZY (*)    Ketones, ur 5 (*)    Protein, ur 30 (*)    Nitrite POSITIVE (*)    Leukocytes,Ua SMALL (*)    Bacteria, UA FEW (*)    All other components within normal limits  URINE CULTURE  LIPASE, BLOOD  CBC    PROCEDURES:  Critical Care performed: No  Procedures   MEDICATIONS ORDERED IN ED: Medications  cephALEXin  (KEFLEX ) capsule 500 mg (500 mg Oral Given 03/22/24 2039)  oxyCODONE -acetaminophen  (PERCOCET/ROXICET) 5-325 MG per tablet 1 tablet (1 tablet Oral Given 03/22/24 2039)     IMPRESSION / MDM /  ASSESSMENT AND PLAN / ED COURSE  I reviewed the triage vital signs and the nursing notes.                              53 y.o. female with past medical history of hyperlipidemia, seizures, bipolar disorder, and chronic back pain who presents to the ED complaining of suprapubic discomfort worsening over the past 3 days.  Patient's presentation is most consistent with acute presentation with potential threat to life or bodily function.  Differential diagnosis includes, but is not limited to, UTI, kidney stone, colitis, diverticulitis, appendicitis.  Patient nontoxic-appearing and in no acute distress, vital signs are unremarkable.  Patient's abdomen is soft with some mild tenderness to palpation in the suprapubic area.  Labs without significant anemia, leukocytosis, electrolyte abnormality, or AKI.  LFTs and lipase are unremarkable, urinalysis does  appear concerning for UTI.  We will send urine for culture and start patient on Keflex .  She is appropriate for discharge home with outpatient follow-up, was counseled to return to the ED for new or worsening symptoms.  Patient agrees with plan.      FINAL CLINICAL IMPRESSION(S) / ED DIAGNOSES   Final diagnoses:  Lower abdominal pain  Lower urinary tract infectious disease     Rx / DC Orders   ED Discharge Orders          Ordered    cephALEXin  (KEFLEX ) 500 MG capsule  4 times daily        03/22/24 2043    ondansetron  (ZOFRAN -ODT) 4 MG disintegrating tablet  Every 8 hours PRN        03/22/24 2043             Note:  This document was prepared using Dragon voice recognition software and may include unintentional dictation errors.   Willo Dunnings, MD 03/22/24 2059

## 2024-03-24 LAB — URINE CULTURE: Culture: >=100000 — AB

## 2024-04-20 ENCOUNTER — Emergency Department
Admission: EM | Admit: 2024-04-20 | Discharge: 2024-04-20 | Disposition: A | Discharge status: Home / Self Care | Attending: Emergency Medicine | Admitting: Emergency Medicine

## 2024-04-20 ENCOUNTER — Emergency Department

## 2024-04-20 ENCOUNTER — Other Ambulatory Visit: Payer: Self-pay

## 2024-04-20 DIAGNOSIS — M5459 Other low back pain: Secondary | ICD-10-CM | POA: Diagnosis not present

## 2024-04-20 DIAGNOSIS — M545 Low back pain, unspecified: Secondary | ICD-10-CM | POA: Insufficient documentation

## 2024-04-20 DIAGNOSIS — G8929 Other chronic pain: Secondary | ICD-10-CM | POA: Insufficient documentation

## 2024-04-20 LAB — URINALYSIS, ROUTINE W REFLEX MICROSCOPIC
Bilirubin Urine: NEGATIVE
Glucose, UA: NEGATIVE mg/dL
Hgb urine dipstick: NEGATIVE
Ketones, ur: NEGATIVE mg/dL
Leukocytes,Ua: NEGATIVE
Nitrite: NEGATIVE
Protein, ur: NEGATIVE mg/dL
Specific Gravity, Urine: 1.021 (ref 1.005–1.030)
pH: 5 (ref 5.0–8.0)

## 2024-04-20 LAB — POC URINE PREG, ED: Preg Test, Ur: NEGATIVE

## 2024-04-20 MED ORDER — METHOCARBAMOL 500 MG PO TABS
500.0000 mg | ORAL_TABLET | Freq: Once | ORAL | Status: AC
  Administered 2024-04-20: 500 mg via ORAL
  Filled 2024-04-20: qty 1

## 2024-04-20 MED ORDER — OXYCODONE-ACETAMINOPHEN 7.5-325 MG PO TABS: 1.0000 | ORAL_TABLET | ORAL | 0 refills | Status: DC | PRN

## 2024-04-20 MED ORDER — METHOCARBAMOL 500 MG PO TABS: 500.0000 mg | ORAL_TABLET | Freq: Four times a day (QID) | ORAL | 0 refills | Status: DC

## 2024-04-20 MED ORDER — OXYCODONE HCL 5 MG PO TABS
5.0000 mg | ORAL_TABLET | Freq: Once | ORAL | Status: AC
  Administered 2024-04-20: 5 mg via ORAL
  Filled 2024-04-20: qty 1

## 2024-04-20 MED ORDER — LIDOCAINE 5 % EX PTCH
1.0000 | MEDICATED_PATCH | CUTANEOUS | Status: DC
  Administered 2024-04-20: 1 via TRANSDERMAL
  Filled 2024-04-20: qty 1

## 2024-04-20 NOTE — ED Triage Notes (Signed)
 Pt to ED for lower back pain x1 week. Reports has had this problem over 15 years

## 2024-04-20 NOTE — Discharge Instructions (Addendum)
 Your evaluated in the ED for acute on chronic lower back pain.  Your x-ray screening is normal.  Your urinalysis is normal.  Your physical exam findings are reassuring.  Follow up with your primary care provider. A referral has been sent for you.  Also, consider following up with pain management for ongoing treatment and pain control

## 2024-04-20 NOTE — ED Notes (Signed)
 Patient is not in the room at this time. Patient told PA that her daughter brought her food and she went out to get it.

## 2024-04-20 NOTE — ED Provider Notes (Signed)
 [facility] Emergency Department Provider Note     Event Date/Time   First MD Initiated Contact with Patient 04/20/24 1110     (approximate)   History   Back Pain   HPI  Karina Anderson is a 53 y.o. female with a past medical history of chronic back pain, seizures, bipolar affective disorder, GERD presents to the ED for evaluation of acute on chronic nontraumatic lower back pain with some radiation to her right lower back.  Denies injury or falls.  Denies leg weakness or numbness.  Patient reports she is having a flareup.  Denies loss of bladder control or saddle anesthesia.  Denies fever, urinary symptoms, chest pain or shortness of breath.     Physical Exam   Triage Vital Signs: ED Triage Vitals [04/20/24 1053]  Encounter Vitals Group     BP (!) 132/100     Girls Systolic BP Percentile      Girls Diastolic BP Percentile      Boys Systolic BP Percentile      Boys Diastolic BP Percentile      Pulse Rate (!) 115     Resp 16     Temp 98.1 F (36.7 C)     Temp src      SpO2 100 %     Weight 135 lb (61.2 kg)     Height 5' 5 (1.651 m)     Head Circumference      Peak Flow      Pain Score 10     Pain Loc      Pain Education      Exclude from Growth Chart     Most recent vital signs: Vitals:   04/20/24 1053  BP: (!) 132/100  Pulse: (!) 115  Resp: 16  Temp: 98.1 F (36.7 C)  SpO2: 100%    General: Well appearing and comfortable. Alert and oriented. INAD.  Skin:  Warm, dry and intact. No rashes or lesions noted.     Head:  NCAT.  Eyes:  PERRLA. EOMI.  Neck:   No cervical spine tenderness to palpation. Full ROM without difficulty.  CV:  Good peripheral perfusion. RRR.  RESP:  Normal effort. LCTAB. No retractions.  ABD:  No distention. Soft, Non tender. No CVA tenderness bilaterally.  BACK:  Spinous process is midline without deformity. Very mild midline tenderness to lumbar region and right paraspinal muscles. MSK:   Full ROM in all  joints. No swelling, deformity or tenderness.  NEURO: Cranial nerves intact. No focal deficits. Speech clear. Sensation and motor function intact. Normal muscle strength of UE & LE. Gait is steady.   ED Results / Procedures / Treatments   Labs (all labs ordered are listed, but only abnormal results are displayed) Labs Reviewed  URINALYSIS, ROUTINE W REFLEX MICROSCOPIC - Abnormal; Notable for the following components:      Result Value   Color, Urine YELLOW (*)    APPearance CLEAR (*)    All other components within normal limits  POC URINE PREG, ED    RADIOLOGY  I personally viewed and evaluated these images as part of my medical decision making, as well as reviewing the written report by the radiologist.  ED Provider Interpretation: No acute fracture of lumbar spine EXTR  DG Lumbar Spine Complete Result Date: 04/20/2024 CLINICAL DATA:  Low back pain. EXAM: LUMBAR SPINE - COMPLETE 4+ VIEW COMPARISON:  Lumbar spine radiograph dated 04/08/2017. FINDINGS: Six lumbar type vertebra with a transitional  S1. No acute fracture or subluxation of the lumbar spine. The vertebral body heights are maintained. The visualized posterior elements are intact. The soft tissues are unremarkable. Bilateral tubal ligation clips. IMPRESSION: No acute findings. Electronically Signed   By: Vanetta Chou M.D.   On: 04/20/2024 12:35    PROCEDURES:  Critical Care performed: No  Procedures  MEDICATIONS ORDERED IN ED: Medications  lidocaine  (LIDODERM ) 5 % 1 patch (1 patch Transdermal Patch Applied 04/20/24 1330)  oxyCODONE  (Oxy IR/ROXICODONE ) immediate release tablet 5 mg (5 mg Oral Given 04/20/24 1156)  methocarbamol  (ROBAXIN ) tablet 500 mg (500 mg Oral Given 04/20/24 1330)   IMPRESSION / MDM / ASSESSMENT AND PLAN / ED COURSE  I reviewed the triage vital signs and the nursing notes.                               53 y.o. female presents to the emergency department for evaluation and treatment of acute on  chronic nontraumatic back pain. See HPI for further details.   Differential diagnosis includes, but is not limited to chronic pain syndrome, radiculopathy, strain, UTI, nephrolithiasis, fracture less likely  Patient's presentation is most consistent with acute complicated illness / injury requiring diagnostic workup.  Patient is alert and oriented.  Noted tachycardic at 115 with elevated BP of 132/100.  This is consistent with patient's trend.  She is well-appearing on physical exam and in no acute distress.  Physical exam findings are as stated above.  Lumbar x-ray obtained to screen for midline tenderness.  X-ray reassuring.  Urinalysis is reassuring.  ED pain management with oxycodone  as patient has extensive allergy list of pain medications.  Methocarbamol  also administered and on reassessment provided patient with some relief.  I have advised her to follow-up with the primary care provider to establish care.  A referral has been placed.  Also advised follow-up with pain management for chronic pain patient verbalized understanding.  She is in stable condition for discharge home.  ED return precaution discussed.   FINAL CLINICAL IMPRESSION(S) / ED DIAGNOSES   Final diagnoses:  Acute on chronic back pain     Rx / DC Orders   ED Discharge Orders          Ordered    methocarbamol  (ROBAXIN ) 500 MG tablet  4 times daily        04/20/24 1348    oxyCODONE -acetaminophen  (PERCOCET) 7.5-325 MG tablet  Every 4 hours PRN        04/20/24 1348    Ambulatory Referral to Primary Care (Establish Care)        04/20/24 1349             Note:  This document was prepared using Dragon voice recognition software and may include unintentional dictation errors.    Margrette, Avalynne Diver A, PA-C 04/20/24 1738    Dorothyann Drivers, MD 04/20/24 [phone]

## 2024-05-09 ENCOUNTER — Telehealth: Payer: Self-pay

## 2024-05-09 DIAGNOSIS — G8929 Other chronic pain: Secondary | ICD-10-CM

## 2024-05-09 NOTE — Progress Notes (Signed)
 Complex Care Management Note Care Guide Note  05/09/2024 Name: Karina Anderson MRN: [id number] DOB: 1970/11/17   Complex Care Management Outreach Attempts: An unsuccessful telephone outreach was attempted today to offer the patient information about available complex care management services.  Follow Up Plan:  Additional outreach attempts will be made to offer the patient complex care management information and services.   Encounter Outcome:  No Answer  Dreama [facility]  [facility], [facility] VBCI Assistant Direct Dial: [phone]  Fax: [phone]

## 2024-05-10 ENCOUNTER — Emergency Department
Admission: EM | Admit: 2024-05-10 | Discharge: 2024-05-10 | Disposition: A | Discharge status: Home / Self Care | Attending: Emergency Medicine | Admitting: Emergency Medicine

## 2024-05-10 ENCOUNTER — Encounter: Payer: Self-pay | Admitting: Intensive Care

## 2024-05-10 ENCOUNTER — Other Ambulatory Visit: Payer: Self-pay

## 2024-05-10 DIAGNOSIS — G8929 Other chronic pain: Secondary | ICD-10-CM | POA: Diagnosis not present

## 2024-05-10 DIAGNOSIS — M545 Low back pain, unspecified: Secondary | ICD-10-CM | POA: Insufficient documentation

## 2024-05-10 DIAGNOSIS — M5459 Other low back pain: Secondary | ICD-10-CM | POA: Diagnosis not present

## 2024-05-10 MED ORDER — PREDNISONE 20 MG PO TABS
60.0000 mg | ORAL_TABLET | Freq: Once | ORAL | Status: AC
  Administered 2024-05-10: 60 mg via ORAL
  Filled 2024-05-10: qty 3

## 2024-05-10 MED ORDER — LIDOCAINE 5 % EX PTCH
1.0000 | MEDICATED_PATCH | CUTANEOUS | Status: DC
  Administered 2024-05-10: 1 via TRANSDERMAL
  Filled 2024-05-10: qty 1

## 2024-05-10 MED ORDER — CYCLOBENZAPRINE HCL 10 MG PO TABS
10.0000 mg | ORAL_TABLET | Freq: Once | ORAL | Status: AC
  Administered 2024-05-10: 10 mg via ORAL
  Filled 2024-05-10: qty 1

## 2024-05-10 MED ORDER — GABAPENTIN 300 MG PO CAPS
300.0000 mg | ORAL_CAPSULE | Freq: Once | ORAL | Status: DC
  Filled 2024-05-10: qty 1

## 2024-05-10 MED ORDER — CYCLOBENZAPRINE HCL 10 MG PO TABS: 10.0000 mg | ORAL_TABLET | Freq: Three times a day (TID) | ORAL | 0 refills | Status: DC | PRN

## 2024-05-10 MED ORDER — PREDNISONE 10 MG (21) PO TBPK
ORAL_TABLET | ORAL | 0 refills | Status: DC
Start: 2024-05-10 — End: 2024-06-28

## 2024-05-10 MED ORDER — LIDOCAINE 5 % EX PTCH: 1.0000 | MEDICATED_PATCH | CUTANEOUS | 0 refills | Status: AC

## 2024-05-10 NOTE — ED Triage Notes (Signed)
 Patient c/o lower back pain. Denies recent injury. Reports having MRI in the past

## 2024-05-10 NOTE — ED Notes (Signed)
 Patient given warm blanket.

## 2024-05-10 NOTE — ED Provider Notes (Signed)
 [facility] Provider Note    Event Date/Time   First MD Initiated Contact with Patient 05/10/24 1420     (approximate)   History   Back Pain   HPI  Karina Anderson is a 53 y.o. female presents to the emergency department today because of concerns for continued low back pain.  This is a chronic issue for the patient.  Located in her left lower back she denies any radiation down her legs.  Denies any difficulty with urination or defecation.  No fevers or chills.  Per chart review patient was seen a few weeks ago for the same concern.     Physical Exam   Triage Vital Signs: ED Triage Vitals  Encounter Vitals Group     BP 05/10/24 1331 (!) 151/101     Girls Systolic BP Percentile --      Girls Diastolic BP Percentile --      Boys Systolic BP Percentile --      Boys Diastolic BP Percentile --      Pulse Rate 05/10/24 1331 (!) 102     Resp 05/10/24 1331 18     Temp 05/10/24 1331 98.3 F (36.8 C)     Temp Source 05/10/24 1331 Oral     SpO2 05/10/24 1331 100 %     Weight 05/10/24 1329 130 lb (59 kg)     Height 05/10/24 1329 5' 5 (1.651 m)     Head Circumference --      Peak Flow --      Pain Score 05/10/24 1329 10     Pain Loc --      Pain Education --      Exclude from Growth Chart --     Most recent vital signs: Vitals:   05/10/24 1331  BP: (!) 151/101  Pulse: (!) 102  Resp: 18  Temp: 98.3 F (36.8 C)  SpO2: 100%   General: Awake, alert, oriented. CV:  Good peripheral perfusion. Tachycardia. Resp:  Normal effort. Lungs clear. Abd:  No distention.  Other:  Minimal tenderness to left lower back.   ED Results / Procedures / Treatments   Labs (all labs ordered are listed, but only abnormal results are displayed) Labs Reviewed - No data to display   EKG  None   RADIOLOGY None   PROCEDURES:  Critical Care performed: No  MEDICATIONS ORDERED IN ED: Medications - No data to display   IMPRESSION / MDM / ASSESSMENT AND PLAN  / ED COURSE  I reviewed the triage vital signs and the nursing notes.                              Differential diagnosis includes, but is not limited to, chronic back pain, sciatica, pulled muscle  Patient's presentation is most consistent with acute presentation with potential threat to life or bodily function.  Patient presented to the emergency department today with her chronic back pain.  On exam patient without any midline tenderness.  Patient denies any fevers or difficulty with urination or defecation.  At this time I do think likely chronic back pain.  Patient was given medication here in the emergency department.  Will discharge with prescriptions for medications to help with pain.      FINAL CLINICAL IMPRESSION(S) / ED DIAGNOSES   Final diagnoses:  Chronic low back pain without sciatica, unspecified back pain laterality     Note:  This document was  prepared using Conservation officer, historic buildings and may include unintentional dictation errors.    Floy Roberts, MD 05/10/24 [phone]

## 2024-05-10 NOTE — ED Notes (Signed)
 Patient given saltines, peanut butter and apple juice per request.

## 2024-05-13 NOTE — Progress Notes (Unsigned)
 Complex Care Management Note Care Guide Note  05/13/2024 Name: Karina Anderson MRN: [id number] DOB: 06-15-1971   Complex Care Management Outreach Attempts: A second unsuccessful outreach was attempted today to offer the patient with information about available complex care management services.  Follow Up Plan:  Additional outreach attempts will be made to offer the patient complex care management information and services.   Encounter Outcome:  No Answer  Dreama [facility]  [facility], [facility] VBCI Assistant Direct Dial: [phone]  Fax: [phone]

## 2024-05-15 NOTE — Progress Notes (Signed)
 Complex Care Management Note Care Guide Note  05/15/2024 Name: Karina Anderson MRN: [id number] DOB: 21-Oct-1970   Complex Care Management Outreach Attempts: A third unsuccessful outreach was attempted today to offer the patient with information about available complex care management services.  Follow Up Plan:  No further outreach attempts will be made at this time. We have been unable to contact the patient to offer or enroll patient in complex care management services.  Encounter Outcome:  No Answer  [facility]reama [facility]  [facility], [facility] VBCI Assistant Direct Dial: [phone]  Fax: [phone]

## 2024-06-28 ENCOUNTER — Encounter: Payer: Self-pay | Admitting: Emergency Medicine

## 2024-06-28 ENCOUNTER — Emergency Department
Admission: EM | Admit: 2024-06-28 | Discharge: 2024-06-28 | Disposition: A | Discharge status: Home / Self Care | Attending: Emergency Medicine | Admitting: Emergency Medicine

## 2024-06-28 ENCOUNTER — Other Ambulatory Visit: Payer: Self-pay

## 2024-06-28 DIAGNOSIS — M545 Low back pain, unspecified: Secondary | ICD-10-CM | POA: Diagnosis present

## 2024-06-28 DIAGNOSIS — G8929 Other chronic pain: Secondary | ICD-10-CM | POA: Diagnosis not present

## 2024-06-28 DIAGNOSIS — I10 Essential (primary) hypertension: Secondary | ICD-10-CM | POA: Diagnosis not present

## 2024-06-28 DIAGNOSIS — M5459 Other low back pain: Secondary | ICD-10-CM | POA: Diagnosis not present

## 2024-06-28 DIAGNOSIS — R030 Elevated blood-pressure reading, without diagnosis of hypertension: Secondary | ICD-10-CM | POA: Diagnosis not present

## 2024-06-28 MED ORDER — OXYCODONE HCL 5 MG PO TABS
5.0000 mg | ORAL_TABLET | Freq: Once | ORAL | Status: AC
  Administered 2024-06-28: 5 mg via ORAL
  Filled 2024-06-28: qty 1

## 2024-06-28 MED ORDER — PREDNISONE 10 MG PO TABS: ORAL_TABLET | ORAL | 0 refills | Status: DC

## 2024-06-28 MED ORDER — LIDOCAINE 5 % EX PTCH: 1.0000 | MEDICATED_PATCH | CUTANEOUS | 0 refills | Status: AC

## 2024-06-28 MED ORDER — METHOCARBAMOL 500 MG PO TABS
500.0000 mg | ORAL_TABLET | Freq: Once | ORAL | Status: AC
  Administered 2024-06-28: 500 mg via ORAL
  Filled 2024-06-28: qty 1

## 2024-06-28 MED ORDER — METHOCARBAMOL 500 MG PO TABS: ORAL_TABLET | ORAL | 0 refills | Status: AC

## 2024-06-28 MED ORDER — LIDOCAINE 5 % EX PTCH
1.0000 | MEDICATED_PATCH | CUTANEOUS | Status: DC
  Administered 2024-06-28: 1 via TRANSDERMAL
  Filled 2024-06-28: qty 1

## 2024-06-28 NOTE — ED Triage Notes (Signed)
 Patient to ED via POV for lower back pain and right upper side pain- states she had her sweat glands removed 12 years ago. States the side pain is a soreness. States her lower back pain ongoing x12 years- worse in cold weather. States only thing to help the back pain is percocet. No new pain per pt.

## 2024-06-28 NOTE — ED Notes (Signed)
 Patient declined discharge vital signs.

## 2024-06-28 NOTE — ED Provider Notes (Signed)
 [facility] Provider Note    Event Date/Time   First MD Initiated Contact with Patient 06/28/24 [phone]     (approximate)   History   Back Pain   HPI  Karina Anderson is a 53 y.o. female that presents to the ED with complaint of lower back pain and also right upper side pain.  Patient has history of low back pain ongoing for approximately 12 years which is worse in cold weather.  She has been seen by [facility] for her back pain.  Currently she denies any incontinence of bowel or bladder, saddle anesthesias.  She states she was given exercises for her back which have not helped.  She reports that only Percocet helps with her back pain.  Patient also reports that she had a history hidradenitis and and had surgery for this 12 years ago.  Patient reports that she does not have a PCP and does not follow-up or do mammograms as she has moved around a great deal and is not sure if her previous PCP has been sending her any referrals.  Patient has history of chronic back pain, bipolar affective disorder, GERD, seizures.     Physical Exam   Triage Vital Signs: ED Triage Vitals  Encounter Vitals Group     BP 06/28/24 0909 (!) 150/106     Girls Systolic BP Percentile --      Girls Diastolic BP Percentile --      Boys Systolic BP Percentile --      Boys Diastolic BP Percentile --      Pulse Rate 06/28/24 0909 (!) 106     Resp 06/28/24 0909 18     Temp 06/28/24 0909 98.1 F (36.7 C)     Temp Source 06/28/24 0909 Oral     SpO2 06/28/24 0909 100 %     Weight 06/28/24 0911 145 lb (65.8 kg)     Height 06/28/24 0911 5' 5 (1.651 m)     Head Circumference --      Peak Flow --      Pain Score 06/28/24 0910 10     Pain Loc --      Pain Education --      Exclude from Growth Chart --     Most recent vital signs: Vitals:   06/28/24 0909  BP: (!) 150/106  Pulse: (!) 106  Resp: 18  Temp: 98.1 F (36.7 C)  SpO2: 100%     General: Awake, no distress.  CV:  Good  peripheral perfusion.  Resp:  Normal effort.  Abd:  No distention.  Other:  L4-L5 and L5-S1 area is tender to mild palpation without step-offs.  Paravertebral muscles bilaterally are tender.  Good muscle strength bilaterally.  Patient is able to stand and ambulate without any assistance.   ED Results / Procedures / Treatments   Labs (all labs ordered are listed, but only abnormal results are displayed) Labs Reviewed - No data to display   PROCEDURES:  Critical Care performed:   Procedures   MEDICATIONS ORDERED IN ED: Medications  lidocaine  (LIDODERM ) 5 % 1 patch (1 patch Transdermal Patch Applied 06/28/24 1122)  oxyCODONE  (Oxy IR/ROXICODONE ) immediate release tablet 5 mg (5 mg Oral Given 06/28/24 1120)  methocarbamol  (ROBAXIN ) tablet 500 mg (500 mg Oral Given 06/28/24 1120)     IMPRESSION / MDM / ASSESSMENT AND PLAN / ED COURSE  I reviewed the triage vital signs and the nursing notes.   Differential diagnosis includes, but is not  limited to, acute on chronic low back pain, chronic back pain with/without sciatica, degenerative disc disease, herniation, cauda equina considered but unlikely.  53 year old female presents to the ED with complaint of low back pain ongoing for approximately 12 years.  She states that she has been to [facility] and has also been to a pain clinic that prescribed exercises.  She denies any recent injury.  Physical exam is reassuring and patient is able to stand and ambulate without any assistance.  Elevated blood pressure was noted however patient states that her blood pressure is always elevated when she is in pain.  Past medical records were reviewed.  While in the ED she was given oxycodone  5 mg, methocarbamol  500 mg and a Lidoderm  patch.  Patient also got a prescription for prednisone  6-day taper, methocarbamol  1 or 2 every 6 hours as needed for muscle spasms and a prescription for Lidoderm  patches.  Patient is to follow-up with [facility] for further  evaluation and I encouraged her to continue with the exercises that she was given by physical therapy to see if this helps and for information on being referred to pain management.  A referral was made for the hospital for evaluation of her elevated blood pressure she was also given a list of clinics in the area for a primary care provider.      Patient's presentation is most consistent with exacerbation of chronic illness.  FINAL CLINICAL IMPRESSION(S) / ED DIAGNOSES   Final diagnoses:  Acute on chronic low back pain  Elevated blood pressure reading     Rx / DC Orders   ED Discharge Orders          Ordered    Ambulatory Referral to Primary Care (Establish Care)       Comments: Hypertension management   06/28/24 1118    predniSONE  (DELTASONE ) 10 MG tablet        06/28/24 1126    methocarbamol  (ROBAXIN ) 500 MG tablet        06/28/24 1126    lidocaine  (LIDODERM ) 5 %  Every 24 hours        06/28/24 1126             Note:  This document was prepared using Dragon voice recognition software and may include unintentional dictation errors.   Saunders Shona CROME, PA-C 06/28/24 1421    Dorothyann Drivers, MD 06/28/24 [phone]

## 2024-06-28 NOTE — Discharge Instructions (Signed)
 Follow-up with [facility] since you have seen them for the back pain in the past.  Continue following their recommendations.  A referral was made through the hospital for a primary care provider.  Prescriptions for a tapering dose of prednisone , methocarbamol  and lidocaine  patches were sent to the pharmacy.  The emergency department does not prescribe long-term narcotic pain medication for chronic back pain.  I encourage you to continue follow-up with the pain management clinic that you started with to see if they are able to do injections if the exercises prescribed are not working.  Also a list of primary care providers are listed on your discharge papers for you to call as well while you are waiting for information from the hospital.  Please go to the following website to schedule new (and existing) patient appointments:   [email or web address]   The following is a list of primary care offices in the area who are accepting new patients at this time.  Please reach out to one of them directly and let them know you would like to schedule an appointment to follow up on an Emergency Department visit, and/or to establish a new primary care provider (PCP).  There are likely other primary care clinics in the are who are accepting new patients, but this is an excellent place to start:  [facility] Lead physician: Dr Jon Eva [address] [place], [place] [address] [phone]  [facility] Lead Physician: Dr Dorette Loron [address], [place], [place] [address] [phone]  [facility]  Lead Physician: Dr Duwaine Louder [address] [place], [place] [address] [phone]  [facility] Lead Physician: Dr Marolyn Officer [address], [place], [place] [address] [phone]  [facility] Lead Physician: Dr Leita Adie [address]  [place], [place], [place] [address] [phone]

## 2024-07-27 ENCOUNTER — Emergency Department

## 2024-07-27 ENCOUNTER — Emergency Department
Admission: EM | Admit: 2024-07-27 | Discharge: 2024-07-27 | Disposition: A | Discharge status: Home / Self Care | Attending: Emergency Medicine | Admitting: Emergency Medicine

## 2024-07-27 DIAGNOSIS — M5459 Other low back pain: Secondary | ICD-10-CM | POA: Diagnosis not present

## 2024-07-27 DIAGNOSIS — M545 Low back pain, unspecified: Secondary | ICD-10-CM | POA: Diagnosis not present

## 2024-07-27 DIAGNOSIS — M549 Dorsalgia, unspecified: Secondary | ICD-10-CM | POA: Diagnosis not present

## 2024-07-27 MED ORDER — PREDNISONE 10 MG (21) PO TBPK: ORAL_TABLET | ORAL | 0 refills | Status: DC

## 2024-07-27 MED ORDER — ACETAMINOPHEN 325 MG PO TABS
650.0000 mg | ORAL_TABLET | Freq: Once | ORAL | Status: AC
  Administered 2024-07-27: 650 mg via ORAL
  Filled 2024-07-27: qty 2

## 2024-07-27 MED ORDER — OXYCODONE HCL 5 MG PO TABS
5.0000 mg | ORAL_TABLET | Freq: Once | ORAL | Status: AC
  Administered 2024-07-27: 5 mg via ORAL
  Filled 2024-07-27: qty 1

## 2024-07-27 MED ORDER — LIDOCAINE 5 % EX PTCH
1.0000 | MEDICATED_PATCH | CUTANEOUS | Status: DC
  Administered 2024-07-27: 1 via TRANSDERMAL
  Filled 2024-07-27: qty 1

## 2024-07-27 MED ORDER — LIDOCAINE 5 % EX PTCH: 1.0000 | MEDICATED_PATCH | Freq: Two times a day (BID) | CUTANEOUS | 0 refills | Status: AC

## 2024-07-27 NOTE — ED Triage Notes (Signed)
 Pt to ED via POV from home. Pt reports continued lower back pain. Pt reports had MRI 56yrs ago that showed deterioration of lower back. Pt reports her for continued to

## 2024-07-27 NOTE — ED Provider Notes (Signed)
 [facility] Provider Note    Event Date/Time   First MD Initiated Contact with Patient 07/27/24 1053     (approximate)   History   Back Pain   HPI  Karina Anderson is a 53 y.o. female with a past medical history of chronic back pain who presents today for evaluation of back pain.  Patient reports that this has been ongoing for the past 4 months.  She is requesting Percocet.  She denies any new or different pain from her baseline.  She denies any radiation of pain into her legs.  No urinary or fecal incontinence or retention.  No saddle anesthesia.  No chest pain or shortness of breath.  No abdominal pain. Normal gait.  Patient Active Problem List   Diagnosis Date Noted   GERD (gastroesophageal reflux disease) 06/29/2023   Mixed hyperlipidemia 06/29/2023   Chronic back pain 11/08/2021   Bipolar affective disorder, manic, severe, with psychotic behavior (HCC) 03/12/2016   Seizures (HCC) 03/11/2016          Physical Exam   Triage Vital Signs: ED Triage Vitals  Encounter Vitals Group     BP 07/27/24 1022 (!) 152/114     Girls Systolic BP Percentile --      Girls Diastolic BP Percentile --      Boys Systolic BP Percentile --      Boys Diastolic BP Percentile --      Pulse Rate 07/27/24 1022 99     Resp 07/27/24 1022 16     Temp 07/27/24 1022 98.2 F (36.8 C)     Temp Source 07/27/24 1022 Oral     SpO2 07/27/24 1022 98 %     Weight 07/27/24 1022 145 lb (65.8 kg)     Height 07/27/24 1022 5' 5 (1.651 m)     Head Circumference --      Peak Flow --      Pain Score 07/27/24 1026 10     Pain Loc --      Pain Education --      Exclude from Growth Chart --     Most recent vital signs: Vitals:   07/27/24 1022 07/27/24 1055  BP: (!) 152/114   Pulse: 99   Resp: 16   Temp: 98.2 F (36.8 C)   SpO2: 98% 98%    Physical Exam Vitals and nursing note reviewed.  Constitutional:      General: Awake and alert. No acute distress.    Appearance:  Normal appearance. The patient is normal weight.  HENT:     Head: Normocephalic and atraumatic.     Mouth: Mucous membranes are moist.  Eyes:     General: PERRL. Normal EOMs        Right eye: No discharge.        Left eye: No discharge.     Conjunctiva/sclera: Conjunctivae normal.  Cardiovascular:     Rate and Rhythm: Normal rate and regular rhythm.     Pulses: Normal pulses.  Pulmonary:     Effort: Pulmonary effort is normal. No respiratory distress.     Breath sounds: Normal breath sounds.  Abdominal:     Abdomen is soft. There is no abdominal tenderness. No rebound or guarding. No distention. Musculoskeletal:        General: No swelling. Normal range of motion.     Cervical back: Normal range of motion and neck supple.  Back: Tenderness across her lumbar area. Strength and sensation 5/5 to bilateral  lower extremities. Normal great toe extension against resistance. Normal sensation throughout feet. Normal patellar reflexes. Negative SLR and opposite SLR bilaterally. Negative FABER test Skin:    General: Skin is warm and dry.     Capillary Refill: Capillary refill takes less than 2 seconds.     Findings: No rash.  Neurological:     Mental Status: The patient is awake and alert.      ED Results / Procedures / Treatments   Labs (all labs ordered are listed, but only abnormal results are displayed) Labs Reviewed - No data to display   EKG     RADIOLOGY I independently reviewed and interpreted imaging and agree with radiologists findings.     PROCEDURES:  Critical Care performed:   Procedures   MEDICATIONS ORDERED IN ED: Medications  lidocaine  (LIDODERM ) 5 % 1 patch (1 patch Transdermal Patch Applied 07/27/24 1110)  oxyCODONE  (Oxy IR/ROXICODONE ) immediate release tablet 5 mg (5 mg Oral Given 07/27/24 1109)  acetaminophen  (TYLENOL ) tablet 650 mg (650 mg Oral Given 07/27/24 1109)     IMPRESSION / MDM / ASSESSMENT AND PLAN / ED COURSE  I reviewed the triage  vital signs and the nursing notes.   Differential diagnosis includes, but is not limited to, acute on chronic back pain, muscle spasm, muscle strain, degenerative disc disease.  I reviewed the patient's chart.  Patient was seen in the emergency department on 06/28/2024 with the same complaint.  Patient was also seen on 9/12 and 8/23 with the same complaint.  This is a 53 year old female with a history of back pain who presents with back pain.  She has 5 out of 5 strength with intact sensation to extensor hallucis dorsiflexion and plantarflexion of bilateral lower extremities with normal patellar reflexes bilaterally. Most likely etiology at this point is muscle strain vs degenerative disc disease. No red flags to indicate patient is at risk for more auspicious process that would require urgent/emergent spinal imaging or subspecialty evaluation at this time. No major trauma, no midline tenderness, no history or physical exam findings to suggest cauda equina syndrome or spinal cord compression. No focal neurological deficits on exam. No constitutional symptoms or history of immunosuppression or IVDA to suggest potential for epidural abscess. Not anticoagulated, no history of bleeding diastasis to suggest risk for epidural hematoma. No chronic steroid use or advanced age or history of malignancy to suggest proclivity towards pathological fracture.  No abdominal pain or flank pain to suggest kidney stone, no history of kidney stone.  No fever or dysuria or CVAT to suggest pyelonephritis.  No chest pain, back pain, shortness of breath, neurological deficits, to suggest vascular catastrophe, and pulses are equal in all 4 extremities.  X-ray obtained in triage is normal.  She was treated symptomatically with good effect.  She has an allergy to NSAIDs, therefore she was given a prescription for Lidoderm  patch and prednisone  as she reports that this has worked for her in the past.  Discussed care instructions and  return precautions with patient. Recommended close outpatient follow-up for re-evaluation. Patient agrees with plan of care.  She was discharged in stable condition.  Patient's presentation is most consistent with acute complicated illness / injury requiring diagnostic workup.      FINAL CLINICAL IMPRESSION(S) / ED DIAGNOSES   Final diagnoses:  Acute bilateral low back pain without sciatica     Rx / DC Orders   ED Discharge Orders          Ordered  lidocaine  (LIDODERM ) 5 %  Every 12 hours        07/27/24 1130    predniSONE  (STERAPRED UNI-PAK 21 TAB) 10 MG (21) TBPK tablet        07/27/24 1130             Note:  This document was prepared using Dragon voice recognition software and may include unintentional dictation errors.   Loucile Posner E, PA-C 07/27/24 1154    Dorothyann Drivers, MD 07/27/24 1443

## 2024-07-27 NOTE — Discharge Instructions (Signed)
 Please follow-up with your outpatient providers for continued care.  Please return for any new, worsening, or changing symptoms or other concerns.  It was a pleasure caring for you today.

## 2024-07-31 ENCOUNTER — Encounter: Payer: Self-pay | Admitting: Surgery

## 2024-08-10 ENCOUNTER — Emergency Department
Admission: EM | Admit: 2024-08-10 | Discharge: 2024-08-10 | Disposition: A | Discharge status: Home / Self Care | Attending: Emergency Medicine | Admitting: Emergency Medicine

## 2024-08-10 ENCOUNTER — Other Ambulatory Visit: Payer: Self-pay

## 2024-08-10 DIAGNOSIS — G8929 Other chronic pain: Secondary | ICD-10-CM | POA: Insufficient documentation

## 2024-08-10 DIAGNOSIS — M545 Low back pain, unspecified: Secondary | ICD-10-CM | POA: Insufficient documentation

## 2024-08-10 DIAGNOSIS — M5459 Other low back pain: Secondary | ICD-10-CM | POA: Diagnosis not present

## 2024-08-10 LAB — URINALYSIS, ROUTINE W REFLEX MICROSCOPIC
Bilirubin Urine: NEGATIVE
Glucose, UA: NEGATIVE mg/dL
Hgb urine dipstick: NEGATIVE
Ketones, ur: NEGATIVE mg/dL
Leukocytes,Ua: NEGATIVE
Nitrite: NEGATIVE
Protein, ur: NEGATIVE mg/dL
Specific Gravity, Urine: 1.018 (ref 1.005–1.030)
pH: 5 (ref 5.0–8.0)

## 2024-08-10 MED ORDER — OXYCODONE HCL 5 MG PO TABS: 5.0000 mg | ORAL_TABLET | Freq: Three times a day (TID) | ORAL | 0 refills | Status: DC | PRN

## 2024-08-10 MED ORDER — OXYCODONE HCL 5 MG PO TABS
5.0000 mg | ORAL_TABLET | Freq: Once | ORAL | Status: AC
  Administered 2024-08-10: 5 mg via ORAL
  Filled 2024-08-10: qty 1

## 2024-08-10 NOTE — ED Triage Notes (Signed)
 Pt to ED for lower back pain since >1 month. States was seen here for same 2 weeks ago and decided not to pick up prescribed meds because she's already tried those and they don't work. Pt is ambulatory with steady gait.

## 2024-08-10 NOTE — ED Provider Notes (Signed)
 [facility] Provider Note    Event Date/Time   First MD Initiated Contact with Patient 08/10/24 [phone]     (approximate)   History   Back Pain   HPI  Karina Anderson is a 53 y.o. female presents to the ED with complaint of low back pain with a history of chronic back pain has been seen in the ED multiple times for the same.  Patient states that there has been no new changes with her back pain or injuries.  She denies any urinary symptoms.  She was seen in the ED on 07/27/2024 at which time she was prescribed prednisone  which she states she did not get filled as that and the lidocaine  patches do not help her.  She has been seen at her PCPs office in the past and has never been referred to anyone else for her back pain.  No saddle anesthesias, incontinence of bowel or bladder or radiculopathy noted.  Patient has history of chronic low back pain, GERD, bipolar disorder, kidney stones.     Physical Exam   Triage Vital Signs: ED Triage Vitals  Encounter Vitals Group     BP 08/10/24 0838 (!) 129/107     Girls Systolic BP Percentile --      Girls Diastolic BP Percentile --      Boys Systolic BP Percentile --      Boys Diastolic BP Percentile --      Pulse Rate 08/10/24 0838 99     Resp 08/10/24 0838 18     Temp 08/10/24 0838 98.3 F (36.8 C)     Temp Source 08/10/24 0838 Oral     SpO2 08/10/24 0838 97 %     Weight 08/10/24 0836 144 lb 13.5 oz (65.7 kg)     Height 08/10/24 0836 5' 5 (1.651 m)     Head Circumference --      Peak Flow --      Pain Score 08/10/24 0835 10     Pain Loc --      Pain Education --      Exclude from Growth Chart --     Most recent vital signs: Vitals:   08/10/24 0838  BP: (!) 129/107  Pulse: 99  Resp: 18  Temp: 98.3 F (36.8 C)  SpO2: 97%     General: Awake, no distress.  Alert, cooperative, ambulatory without any assistance. CV:  Good peripheral perfusion.  Resp:  Normal effort.  Abd:  No distention.   Other:  Moderate tenderness on palpation of the L4-L5, L5-S1 and paravertebral muscles bilaterally.  Range of motion is slow and guarded.  Straight leg raises are negative.  Good muscle strength bilaterally lower extremities.  Patient is able to stand and ambulate without any assistance.  Reflexes are 2+ bilaterally.   ED Results / Procedures / Treatments   Labs (all labs ordered are listed, but only abnormal results are displayed) Labs Reviewed  URINALYSIS, ROUTINE W REFLEX MICROSCOPIC - Abnormal; Notable for the following components:      Result Value   Color, Urine YELLOW (*)    APPearance CLOUDY (*)    All other components within normal limits      PROCEDURES:  Critical Care performed:   Procedures   MEDICATIONS ORDERED IN ED: Medications  oxyCODONE  (Oxy IR/ROXICODONE ) immediate release tablet 5 mg (5 mg Oral Given 08/10/24 0924)     IMPRESSION / MDM / ASSESSMENT AND PLAN / ED COURSE  I reviewed the triage  vital signs and the nursing notes.   Differential diagnosis includes, but is not limited to, chronic low back pain, lumbosacral strain, chronic pain syndrome  53 year old female presents to the ED with complaint of low back pain ongoing for several years.  Patient denies any new symptoms and reports that she did not get the prescription for the steroids after being seen in November.  Patient was made aware that she has been referred to the orthopedist for evaluation of her back pain and possible physical therapy.  She was given oxycodone  while in the ED and a prescription for 10 tablets was sent to the pharmacy.  Patient is encouraged to use ice or heat to her back as needed for discomfort and to make an appointment      Patient's presentation is most consistent with exacerbation of chronic illness.  FINAL CLINICAL IMPRESSION(S) / ED DIAGNOSES   Final diagnoses:  Chronic midline low back pain without sciatica     Rx / DC Orders   ED Discharge Orders           Ordered    oxyCODONE  (OXY IR/ROXICODONE ) 5 MG immediate release tablet  Every 8 hours PRN        08/10/24 0941             Note:  This document was prepared using Dragon voice recognition software and may include unintentional dictation errors.   Saunders Shona CROME, PA-C 08/10/24 1139    Arlander Charleston, MD 08/10/24 [phone]

## 2024-08-10 NOTE — Discharge Instructions (Signed)
 Call make an with the orthopedist listed on your discharge papers.  Use moist heat or ice to your back as needed for discomfort.  Take the pain medication only as directed.

## 2024-08-10 NOTE — ED Notes (Signed)
 Pt stated she was referred to outpatient specialist for back pain but never received call from them or was given a number to call for appointment

## 2024-08-16 DIAGNOSIS — M5459 Other low back pain: Secondary | ICD-10-CM | POA: Diagnosis not present

## 2024-08-16 DIAGNOSIS — G8929 Other chronic pain: Secondary | ICD-10-CM | POA: Diagnosis not present

## 2024-08-23 ENCOUNTER — Emergency Department

## 2024-08-23 ENCOUNTER — Emergency Department
Admission: EM | Admit: 2024-08-23 | Discharge: 2024-08-23 | Disposition: A | Discharge status: Home / Self Care | Attending: Emergency Medicine | Admitting: Emergency Medicine

## 2024-08-23 ENCOUNTER — Other Ambulatory Visit: Payer: Self-pay

## 2024-08-23 DIAGNOSIS — R1013 Epigastric pain: Secondary | ICD-10-CM | POA: Diagnosis present

## 2024-08-23 DIAGNOSIS — K2970 Gastritis, unspecified, without bleeding: Secondary | ICD-10-CM | POA: Insufficient documentation

## 2024-08-23 LAB — URINALYSIS, ROUTINE W REFLEX MICROSCOPIC
Bilirubin Urine: NEGATIVE
Glucose, UA: NEGATIVE mg/dL
Hgb urine dipstick: NEGATIVE
Ketones, ur: NEGATIVE mg/dL
Leukocytes,Ua: NEGATIVE
Nitrite: NEGATIVE
Protein, ur: NEGATIVE mg/dL
Specific Gravity, Urine: 1.019 (ref 1.005–1.030)
pH: 8 (ref 5.0–8.0)

## 2024-08-23 LAB — COMPREHENSIVE METABOLIC PANEL WITH GFR
ALT: 8 U/L (ref 0–44)
AST: 20 U/L (ref 15–41)
Albumin: 4.2 g/dL (ref 3.5–5.0)
Alkaline Phosphatase: 79 U/L (ref 38–126)
Anion gap: 10 (ref 5–15)
BUN: 18 mg/dL (ref 6–20)
CO2: 29 mmol/L (ref 22–32)
Calcium: 9.6 mg/dL (ref 8.9–10.3)
Chloride: 104 mmol/L (ref 98–111)
Creatinine, Ser: 0.85 mg/dL (ref 0.44–1.00)
GFR, Estimated: >60 mL/min
Glucose, Bld: 99 mg/dL (ref 70–99)
Potassium: 3.8 mmol/L (ref 3.5–5.1)
Sodium: 144 mmol/L (ref 135–145)
Total Bilirubin: 0.4 mg/dL (ref 0.0–1.2)
Total Protein: 7.1 g/dL (ref 6.5–8.1)

## 2024-08-23 LAB — CBC
HCT: 37.2 % (ref 36.0–46.0)
Hemoglobin: 11.9 g/dL — ABNORMAL LOW (ref 12.0–15.0)
MCH: 28.3 pg (ref 26.0–34.0)
MCHC: 32 g/dL (ref 30.0–36.0)
MCV: 88.4 fL (ref 80.0–100.0)
Platelets: 270 K/uL (ref 150–400)
RBC: 4.21 MIL/uL (ref 3.87–5.11)
RDW: 13.8 % (ref 11.5–15.5)
WBC: 5.5 K/uL (ref 4.0–10.5)
nRBC: 0 % (ref 0.0–0.2)

## 2024-08-23 LAB — LIPASE, BLOOD: Lipase: 16 U/L (ref 11–51)

## 2024-08-23 LAB — TROPONIN T, HIGH SENSITIVITY
Troponin T High Sensitivity: <15 ng/L (ref 0–19)
Troponin T High Sensitivity: <15 ng/L (ref 0–19)

## 2024-08-23 MED ORDER — MORPHINE SULFATE (PF) 2 MG/ML IV SOLN
2.0000 mg | Freq: Once | INTRAVENOUS | Status: AC
  Administered 2024-08-23: 2 mg via INTRAVENOUS
  Filled 2024-08-23: qty 1

## 2024-08-23 MED ORDER — ALUM & MAG HYDROXIDE-SIMETH 200-200-20 MG/5ML PO SUSP
30.0000 mL | Freq: Once | ORAL | Status: AC
  Administered 2024-08-23: 30 mL via ORAL
  Filled 2024-08-23: qty 30

## 2024-08-23 MED ORDER — LIDOCAINE VISCOUS HCL 2 % MT SOLN
15.0000 mL | Freq: Once | OROMUCOSAL | Status: AC
  Administered 2024-08-23: 15 mL via ORAL
  Filled 2024-08-23: qty 15

## 2024-08-23 MED ORDER — SUCRALFATE 1 G PO TABS: 1.0000 g | ORAL_TABLET | Freq: Four times a day (QID) | ORAL | 1 refills | Status: AC

## 2024-08-23 MED ORDER — PANTOPRAZOLE SODIUM 20 MG PO TBEC: 20.0000 mg | DELAYED_RELEASE_TABLET | Freq: Every day | ORAL | 1 refills | Status: AC

## 2024-08-23 MED ORDER — FENTANYL CITRATE (PF) 50 MCG/ML IJ SOSY
50.0000 ug | PREFILLED_SYRINGE | Freq: Once | INTRAMUSCULAR | Status: DC
  Filled 2024-08-23: qty 1

## 2024-08-23 MED ORDER — SODIUM CHLORIDE 0.9 % IV BOLUS
500.0000 mL | Freq: Once | INTRAVENOUS | Status: AC
  Administered 2024-08-23: 500 mL via INTRAVENOUS

## 2024-08-23 MED ORDER — LIDOCAINE VISCOUS HCL 2 % MT SOLN: 15.0000 mL | OROMUCOSAL | 0 refills | Status: AC | PRN

## 2024-08-23 NOTE — ED Triage Notes (Signed)
 Pt reports upper abd pain for 3 days, states that alka seltzer only works for a little bit, states that it reminds her of when she had to have her gallbladder out a year ago, also reports no bm in 3 days and is having lower back pain

## 2024-08-23 NOTE — ED Notes (Signed)
 Pt refused the fentanyl  stating no, no I'm not taking that. Its going to make me sick This RN informed pt that this is a small dose. Pt still declined. Asked for morphine . MD made aware. See [facility]

## 2024-08-23 NOTE — ED Provider Notes (Signed)
 "  [facility] Provider Note    Event Date/Time   First MD Initiated Contact with Patient 08/23/24 1700     (approximate)   History   Abdominal Pain   HPI  Karina Anderson is a 53 y.o. female who presents to the emergency department today because of concerns for epigastric pain.  The pain has been present for roughly 3 days.  She describes it as sharp.  It has been severe.  In addition she states that she has not had a bowel movement in the past 3 days.  No significant nausea or vomiting.  Denies any fevers or chills.  States it does remind her of the pain she was having prior to her cholecystectomy.     Physical Exam   Triage Vital Signs: ED Triage Vitals  Encounter Vitals Group     BP 08/23/24 1533 (!) 128/116     Girls Systolic BP Percentile --      Girls Diastolic BP Percentile --      Boys Systolic BP Percentile --      Boys Diastolic BP Percentile --      Pulse Rate 08/23/24 1533 (!) 103     Resp 08/23/24 1533 18     Temp 08/23/24 1533 98.6 F (37 C)     Temp Source 08/23/24 1533 Oral     SpO2 08/23/24 1533 95 %     Weight 08/23/24 1534 144 lb 13.5 oz (65.7 kg)     Height 08/23/24 1534 5' 5 (1.651 m)     Head Circumference --      Peak Flow --      Pain Score 08/23/24 1534 10     Pain Loc --      Pain Education --      Exclude from Growth Chart --     Most recent vital signs: Vitals:   08/23/24 1533  BP: (!) 128/116  Pulse: (!) 103  Resp: 18  Temp: 98.6 F (37 C)  SpO2: 95%   General: Awake, alert, oriented. CV:  Good peripheral perfusion. Regular rate and rhythm. Resp:  Normal effort. Lungs clear. Abd:  No distention. Tender to palpation in the epigastric region.   ED Results / Procedures / Treatments   Labs (all labs ordered are listed, but only abnormal results are displayed) Labs Reviewed  CBC - Abnormal; Notable for the following components:      Result Value   Hemoglobin 11.9 (*)    All other components within  normal limits  URINALYSIS, ROUTINE W REFLEX MICROSCOPIC - Abnormal; Notable for the following components:   Color, Urine YELLOW (*)    APPearance HAZY (*)    All other components within normal limits  LIPASE, BLOOD  COMPREHENSIVE METABOLIC PANEL WITH GFR  TROPONIN T, HIGH SENSITIVITY  TROPONIN T, HIGH SENSITIVITY     EKG  I, Guadalupe Eagles, attending physician, personally viewed and interpreted this EKG  EKG Time: 1540 Rate: 106 Rhythm: sinus tachycardia Axis: normal Intervals: qtc 462 QRS: narrow ST changes: no st elevation Impression: abnormal ekg  RADIOLOGY I independently interpreted and visualized the ct abd/pel. My interpretation: No free air. No inflammation Radiology read: IMPRESSION:  1. No acute findings in the abdomen or pelvis.       PROCEDURES:  Critical Care performed: No  MEDICATIONS ORDERED IN ED: Medications - No data to display   IMPRESSION / MDM / ASSESSMENT AND PLAN / ED COURSE  I reviewed the triage vital signs  and the nursing notes.                              Differential diagnosis includes, but is not limited to, pancreatitis, gastritis, SBO  Patient's presentation is most consistent with acute presentation with potential threat to life or bodily function.   Patient presented to the emergency department today because of concerns for epigastric pain.  On exam she is minimally tender in the epigastric region.  Blood work without significant lipase elevation or leukocytosis.  Given reported history of not having a bowel movement in 3 days did obtain a CT scan to make sure there was no obstruction.  CT did not show any concerning acute findings.  Patient did feel better after GI cocktail.  At this time I think gastritis likely.  Will discharge with further medications to help with her symptoms.  Will give dietary guidelines.     FINAL CLINICAL IMPRESSION(S) / ED DIAGNOSES   Final diagnoses:  Gastritis, presence of bleeding unspecified,  unspecified chronicity, unspecified gastritis type      Note:  This document was prepared using Dragon voice recognition software and may include unintentional dictation errors.    Floy Roberts, MD 08/23/24 [phone]  "

## 2024-08-23 NOTE — ED Notes (Signed)
 Attempted to medicate pt at this time. Pt is requesting crackers, peanut butter and apple juice prior to medication administration. All snacks/beverages provided per pt request. Pt and family member state no further needs at this time.

## 2024-09-05 ENCOUNTER — Other Ambulatory Visit: Payer: Self-pay

## 2024-09-05 ENCOUNTER — Emergency Department
Admission: EM | Admit: 2024-09-05 | Discharge: 2024-09-05 | Disposition: A | Discharge status: Home / Self Care | Attending: Emergency Medicine | Admitting: Emergency Medicine

## 2024-09-05 ENCOUNTER — Encounter: Payer: Self-pay | Admitting: Emergency Medicine

## 2024-09-05 DIAGNOSIS — G8929 Other chronic pain: Secondary | ICD-10-CM | POA: Diagnosis not present

## 2024-09-05 DIAGNOSIS — M545 Low back pain, unspecified: Secondary | ICD-10-CM | POA: Insufficient documentation

## 2024-09-05 MED ORDER — OXYCODONE-ACETAMINOPHEN 5-325 MG PO TABS: 1.0000 | ORAL_TABLET | Freq: Once | ORAL | Status: DC

## 2024-09-05 MED ORDER — OXYCODONE-ACETAMINOPHEN 5-325 MG PO TABS: 1.0000 | ORAL_TABLET | Freq: Four times a day (QID) | ORAL | 0 refills | Status: AC | PRN

## 2024-09-05 NOTE — ED Triage Notes (Signed)
 Pt to ED via POV c/o lower back pain. Pt states that she has been seen by the back specialist but nothing is helping her pain. Pt is in NAD. Ambulatory to triage

## 2024-09-05 NOTE — ED Provider Notes (Signed)
 "  [facility] Provider Note    Event Date/Time   First MD Initiated Contact with Patient 09/05/24 1122     (approximate)   History   Back Pain   HPI  Karina Anderson is a 54 y.o. female with PMH of chronic back pain, seizures, bipolar disorder presents for evaluation of lower back pain.  Patient has been seen by this ED several times for the same complaint.  Patient denies any new injuries, falls or trauma.  Patient reports that the medications she has been prescribed by the orthopedic specialist are not helping.  She is waiting for an MRI to be scheduled.  She denies fevers, loss of bladder or bowel control, numbness/weakness/tingling in the legs.      Physical Exam   Triage Vital Signs: ED Triage Vitals  Encounter Vitals Group     BP 09/05/24 1057 (!) 153/93     Girls Systolic BP Percentile --      Girls Diastolic BP Percentile --      Boys Systolic BP Percentile --      Boys Diastolic BP Percentile --      Pulse Rate 09/05/24 1057 87     Resp 09/05/24 1057 16     Temp 09/05/24 1057 98.2 F (36.8 C)     Temp Source 09/05/24 1057 Oral     SpO2 09/05/24 1057 98 %     Weight 09/05/24 1059 144 lb 13.5 oz (65.7 kg)     Height 09/05/24 1059 5' 5 (1.651 m)     Head Circumference --      Peak Flow --      Pain Score 09/05/24 1058 10     Pain Loc --      Pain Education --      Exclude from Growth Chart --     Most recent vital signs: Vitals:   09/05/24 1057  BP: (!) 153/93  Pulse: 87  Resp: 16  Temp: 98.2 F (36.8 C)  SpO2: 98%   General: Awake, no distress.  CV:  Good peripheral perfusion.  RRR. Resp:  Normal effort.  CTAB. Abd:  No distention.  Other:  Mild tenderness to palpation over the lumbar spine and paraspinal muscles, dorsalis pedis pulses 2+ and regular bilaterally, sensation maintained in bilateral lower extremities, patellar tendon reflexes 2+.  Negative straight leg raise bilaterally.  Strength is equal in bilateral lower  extremities.   ED Results / Procedures / Treatments   Labs (all labs ordered are listed, but only abnormal results are displayed) Labs Reviewed - No data to display    PROCEDURES:  Critical Care performed: No  Procedures   MEDICATIONS ORDERED IN ED: Medications  oxyCODONE -acetaminophen  (PERCOCET/ROXICET) 5-325 MG per tablet 1 tablet (has no administration in time range)     IMPRESSION / MDM / ASSESSMENT AND PLAN / ED COURSE  I reviewed the triage vital signs and the nursing notes.                             54 year old female presents for evaluation of chronic lower back pain.  Vital signs are stable aside from elevated blood pressure which I suspect is secondary to pain.  Vital signs stable otherwise.  Patient NAD on exam.  Differential diagnosis includes, but is not limited to, chronic low back pain, arthritis, spinal stenosis, lumbar radiculopathy, muscle strain  Patient's presentation is most consistent with acute, uncomplicated illness.  Considered  x-ray but patient had a negative x-ray at the end of November.  She has not had any falls or trauma since then so I feel this would be low yield.  Considered need for emergent MRI but patient does not present with any signs or symptoms concerning for acute spinal cord compression.  She does not have any objective weakness or sensation loss.  Patient's exam is reassuring and that she has equal pulses, reflexes, strength and sensation in her lower extremities.  Suspect that this is an exacerbation of patient's chronic back pain.  She has been evaluated by orthopedics who recommended MRI with likely need for epidural steroid referral.  Will give patient a course of pain medication.  I did advise her to continue taking the previously prescribed medications by the orthopedic specialist.  She did not need a note for work.  She voiced understanding, all questions were answered and she was stable at discharge.     FINAL CLINICAL  IMPRESSION(S) / ED DIAGNOSES   Final diagnoses:  Chronic bilateral low back pain without sciatica     Rx / DC Orders   ED Discharge Orders          Ordered    oxyCODONE -acetaminophen  (PERCOCET) 5-325 MG tablet  Every 6 hours PRN        09/05/24 1152             Note:  This document was prepared using Dragon voice recognition software and may include unintentional dictation errors.   Cleaster Tinnie LABOR, PA-C 09/05/24 1153    Ernest Ronal BRAVO, MD 09/06/24 1527  "

## 2024-09-05 NOTE — Discharge Instructions (Signed)
 Continue to take the previously prescribed medications by the orthopedic provider.  You can take 650 mg of Tylenol  every 6 hours as needed for pain. You can use ice, heat, muscle creams and other topical pain relievers as well.  I have sent a strong pain medication called Percocet to the pharmacy.  This medication can be taken every 4-6 hours as needed for severe or breakthrough pain.  This medication can cause dependency so only take if you are unable to control your pain with other medications.  It will make you sleepy so do not drive or operate heavy machinery after taking it.  Do not drink alcohol while taking this medication.  This medication can also cause constipation so please take an over-the-counter stool softener like MiraLAX  or Colace while taking it.  This medication contains both oxycodone  and acetaminophen .  Do not take Tylenol  at the same time.  You can take the Percocet or Tylenol  but not both.

## 2024-09-12 ENCOUNTER — Telehealth: Payer: Self-pay | Admitting: *Deleted

## 2024-09-12 DIAGNOSIS — E782 Mixed hyperlipidemia: Secondary | ICD-10-CM

## 2024-09-12 NOTE — Progress Notes (Signed)
 Complex Care Management Note Care Guide Note  09/12/2024 Name: Shatonya Passon MRN: [id number] DOB: 1970/12/22   Complex Care Management Outreach Attempts: An unsuccessful telephone outreach was attempted today to offer the patient information about available complex care management services.  Follow Up Plan:  Additional outreach attempts will be made to offer the patient complex care management information and services.   Encounter Outcome:  No Answer  Harlene Satterfield  [facility]  [facility], [facility] Guide  Direct Dial: [phone]  Fax [phone]

## 2024-09-13 NOTE — Progress Notes (Unsigned)
 Complex Care Management Note Care Guide Note  09/13/2024 Name: Karina Anderson MRN: [id number] DOB: 01/03/1971   Complex Care Management Outreach Attempts: A second unsuccessful outreach was attempted today to offer the patient with information about available complex care management services.  Follow Up Plan:  Additional outreach attempts will be made to offer the patient complex care management information and services.   Encounter Outcome:  No Answer  Harlene Satterfield  [facility]  [facility], [facility] Guide  Direct Dial: [phone]  Fax [phone]

## 2024-09-16 NOTE — Progress Notes (Signed)
 Complex Care Management Note Care Guide Note  09/16/2024 Name: Karina Anderson MRN: [id number] DOB: [birth year]   Complex Care Management Outreach Attempts: A third unsuccessful outreach was attempted today to offer the patient with information about available complex care management services.  Follow Up Plan:  No further outreach attempts will be made at this time. We have been unable to contact the patient to offer or enroll patient in complex care management services.  Encounter Outcome:  No Answer  Harlene Satterfield  [facility]  [facility], [facility] Guide  Direct Dial: [phone]  Fax [phone]

## 2024-10-01 ENCOUNTER — Other Ambulatory Visit: Payer: Self-pay

## 2024-10-01 ENCOUNTER — Emergency Department

## 2024-10-01 ENCOUNTER — Emergency Department: Admission: EM | Admit: 2024-10-01 | Discharge: 2024-10-01 | Disposition: A | Discharge status: Home / Self Care

## 2024-10-01 DIAGNOSIS — R519 Headache, unspecified: Secondary | ICD-10-CM | POA: Insufficient documentation

## 2024-10-01 DIAGNOSIS — H538 Other visual disturbances: Secondary | ICD-10-CM | POA: Insufficient documentation

## 2024-10-01 DIAGNOSIS — R42 Dizziness and giddiness: Secondary | ICD-10-CM | POA: Insufficient documentation

## 2024-10-01 DIAGNOSIS — R112 Nausea with vomiting, unspecified: Secondary | ICD-10-CM | POA: Insufficient documentation

## 2024-10-01 DIAGNOSIS — Y9301 Activity, walking, marching and hiking: Secondary | ICD-10-CM | POA: Insufficient documentation

## 2024-10-01 DIAGNOSIS — Y92524 Gas station as the place of occurrence of the external cause: Secondary | ICD-10-CM | POA: Insufficient documentation

## 2024-10-01 DIAGNOSIS — W19XXXA Unspecified fall, initial encounter: Secondary | ICD-10-CM

## 2024-10-01 DIAGNOSIS — W01198A Fall on same level from slipping, tripping and stumbling with subsequent striking against other object, initial encounter: Secondary | ICD-10-CM | POA: Insufficient documentation

## 2024-10-01 LAB — CBC WITH DIFFERENTIAL/PLATELET
Abs Immature Granulocytes: 0.01 10*3/uL (ref 0.00–0.07)
Basophils Absolute: 0 10*3/uL (ref 0.0–0.1)
Basophils Relative: 0 %
Eosinophils Absolute: 0.1 10*3/uL (ref 0.0–0.5)
Eosinophils Relative: 3 %
HCT: 37 % (ref 36.0–46.0)
Hemoglobin: 11.8 g/dL — ABNORMAL LOW (ref 12.0–15.0)
Immature Granulocytes: 0 %
Lymphocytes Relative: 37 %
Lymphs Abs: 1.7 10*3/uL (ref 0.7–4.0)
MCH: 28.6 pg (ref 26.0–34.0)
MCHC: 31.9 g/dL (ref 30.0–36.0)
MCV: 89.6 fL (ref 80.0–100.0)
Monocytes Absolute: 0.4 10*3/uL (ref 0.1–1.0)
Monocytes Relative: 9 %
Neutro Abs: 2.4 10*3/uL (ref 1.7–7.7)
Neutrophils Relative %: 51 %
Platelets: 293 10*3/uL (ref 150–400)
RBC: 4.13 MIL/uL (ref 3.87–5.11)
RDW: 13.6 % (ref 11.5–15.5)
WBC: 4.7 10*3/uL (ref 4.0–10.5)
nRBC: 0 % (ref 0.0–0.2)

## 2024-10-01 LAB — COMPREHENSIVE METABOLIC PANEL WITH GFR
ALT: 8 U/L (ref 0–44)
AST: 19 U/L (ref 15–41)
Albumin: 4.4 g/dL (ref 3.5–5.0)
Alkaline Phosphatase: 78 U/L (ref 38–126)
Anion gap: 10 (ref 5–15)
BUN: 20 mg/dL (ref 6–20)
CO2: 31 mmol/L (ref 22–32)
Calcium: 9.9 mg/dL (ref 8.9–10.3)
Chloride: 106 mmol/L (ref 98–111)
Creatinine, Ser: 0.84 mg/dL (ref 0.44–1.00)
GFR, Estimated: >60 mL/min
Glucose, Bld: 91 mg/dL (ref 70–99)
Potassium: 3.5 mmol/L (ref 3.5–5.1)
Sodium: 147 mmol/L — ABNORMAL HIGH (ref 135–145)
Total Bilirubin: 0.3 mg/dL (ref 0.0–1.2)
Total Protein: 7.4 g/dL (ref 6.5–8.1)

## 2024-10-01 LAB — TROPONIN T, HIGH SENSITIVITY: Troponin T High Sensitivity: <6 ng/L (ref 0–19)

## 2024-10-01 MED ORDER — SODIUM CHLORIDE 0.9 % IV BOLUS
1000.0000 mL | Freq: Once | INTRAVENOUS | Status: AC
  Administered 2024-10-01: 1000 mL via INTRAVENOUS

## 2024-10-01 MED ORDER — DIPHENHYDRAMINE HCL 50 MG/ML IJ SOLN
12.5000 mg | Freq: Once | INTRAMUSCULAR | Status: AC
  Administered 2024-10-01: 12.5 mg via INTRAVENOUS
  Filled 2024-10-01: qty 1

## 2024-10-01 MED ORDER — ONDANSETRON 4 MG PO TBDP: 4.0000 mg | ORAL_TABLET | Freq: Three times a day (TID) | ORAL | 0 refills | Status: AC | PRN

## 2024-10-01 MED ORDER — ACETAMINOPHEN 325 MG PO TABS
650.0000 mg | ORAL_TABLET | Freq: Once | ORAL | Status: AC
  Administered 2024-10-01: 650 mg via ORAL
  Filled 2024-10-01: qty 2

## 2024-10-01 MED ORDER — METOCLOPRAMIDE HCL 5 MG/ML IJ SOLN
10.0000 mg | Freq: Once | INTRAMUSCULAR | Status: AC
  Administered 2024-10-01: 10 mg via INTRAVENOUS
  Filled 2024-10-01: qty 2

## 2024-10-01 NOTE — ED Provider Notes (Signed)
 "  [facility] Provider Note    Event Date/Time   First MD Initiated Contact with Patient 10/01/24 1504     (approximate)   History   Fall   HPI  Karina Anderson is a 54 y.o. female with PMH of seizures, bipolar disorder, chronic pain disorder, chronic back pain, arthritis presents for evaluation after she fell 3 days ago.  Patient states she was walking to the gas station when she suddenly collapsed.  She did not feel dizzy or lightheaded before the fall.  Patient reports that she landed on her hands and knees and hit her head on the ground.  Patient endorses ongoing headache, intermittent blurred vision, nausea, vomiting and dizziness.  Patient states she has thrown up a few times over the past couple days.  Patient denies any chest pain, heart racing and shortness of breath.      Physical Exam   Triage Vital Signs: ED Triage Vitals [10/01/24 1320]  Encounter Vitals Group     BP (!) 141/84     Girls Systolic BP Percentile      Girls Diastolic BP Percentile      Boys Systolic BP Percentile      Boys Diastolic BP Percentile      Pulse Rate 90     Resp 18     Temp 98 F (36.7 C)     Temp Source Oral     SpO2 98 %     Weight      Height      Head Circumference      Peak Flow      Pain Score 10     Pain Loc      Pain Education      Exclude from Growth Chart     Most recent vital signs: Vitals:   10/01/24 1320 10/01/24 1723  BP: (!) 141/84 129/74  Pulse: 90 84  Resp: 18 18  Temp: 98 F (36.7 C) 98 F (36.7 C)  SpO2: 98% 98%    General: Awake, no distress.  CV:  Good peripheral perfusion.  RRR. Resp:  Normal effort.  CTAB. Abd:  No distention.  Other:  PERRL, EOM intact, no ataxia on finger-nose, no pronator drift, symmetric facial movements.   ED Results / Procedures / Treatments   Labs (all labs ordered are listed, but only abnormal results are displayed) Labs Reviewed  COMPREHENSIVE METABOLIC PANEL WITH GFR - Abnormal; Notable  for the following components:      Result Value   Sodium 147 (*)    All other components within normal limits  CBC WITH DIFFERENTIAL/PLATELET - Abnormal; Notable for the following components:   Hemoglobin 11.8 (*)    All other components within normal limits  TROPONIN T, HIGH SENSITIVITY     EKG  ED provider interpretation: NSR   RADIOLOGY  CT head and CT cervical spine obtained, interpreted the images as well as reviewed radiologist report.  Images were negative for acute abnormalities.  PROCEDURES:  Critical Care performed: No  Procedures   MEDICATIONS ORDERED IN ED: Medications  sodium chloride  0.9 % bolus 1,000 mL (0 mLs Intravenous Stopped 10/01/24 1714)  acetaminophen  (TYLENOL ) tablet 650 mg (650 mg Oral Given 10/01/24 1559)  metoCLOPramide  (REGLAN ) injection 10 mg (10 mg Intravenous Given 10/01/24 1602)  diphenhydrAMINE  (BENADRYL ) injection 12.5 mg (12.5 mg Intravenous Given 10/01/24 1600)     IMPRESSION / MDM / ASSESSMENT AND PLAN / ED COURSE  I reviewed the triage vital signs  and the nursing notes.                             54 year old female presents for evaluation after a fall.  Blood pressure is little bit elevated which patient attributes to her chronic back pain, vital signs stable otherwise.  Patient uncomfortable appearing on exam.  Differential diagnosis includes, but is not limited to, closed head injury, intracranial bleed, muscle strain, migraine headache, tension headache, electrolyte abnormality, dehydration, cardiac arrhythmia.  Patient's presentation is most consistent with acute complicated illness / injury requiring diagnostic workup.  Physical exam is reassuring and that I do not note any focal neurodeficits.  Given that patient did hit her head and has ongoing symptoms will obtain CT head and CT cervical spine for further evaluation.  Since this does not sound like a mechanical fall we will do further workup including EKG and labs.  If workup is  reassuring feel the patient would be stable for discharge.  Will have nursing staff start IV and give patient IV fluids, Tylenol , Reglan  and Benadryl  for treatment of her headache.  Patient reports an allergy to NSAIDs.  Workup overall reassuring feel that patient is stable for discharge.  Clinical Course as of 10/01/24 1751  Tue Oct 01, 2024  1617 CBC with Differential(!) Mild anemia, otherwise unremarkable. [LD]  1617 ED EKG Normal sinus rhythm. [LD]  1624 Comprehensive metabolic panel(!) Mild hypernatremia, otherwise unremarkable. Suspect this is due to patient's recent vomiting. [LD]  1649 Troponin T, High Sensitivity Not elevated, do not suspect NSTEMI as a cause of patient's collapse. [LD]  1747 Patient reassessed and reports her headache has improved. She states she is ready to walk out of here. Will get patient's discharge papers for her. [LD]    Clinical Course User Index [LD] Cleaster Tinnie LABOR, PA-C     FINAL CLINICAL IMPRESSION(S) / ED DIAGNOSES   Final diagnoses:  Fall, initial encounter  Acute nonintractable headache, unspecified headache type     Rx / DC Orders   ED Discharge Orders          Ordered    ondansetron  (ZOFRAN -ODT) 4 MG disintegrating tablet  Every 8 hours PRN        10/01/24 1750             Note:  This document was prepared using Dragon voice recognition software and may include unintentional dictation errors.   Cleaster Tinnie LABOR, PA-C 10/01/24 1759    Fernand Rossie HERO, MD 10/01/24 2252  "

## 2024-10-01 NOTE — ED Notes (Addendum)
 Pt verbalized understanding of discharge instructions. Opportunity for questions provided.   Pt given apple juice, peanut butter x4 and graham crackers x2 packages at discharge.

## 2024-10-01 NOTE — ED Triage Notes (Signed)
 Pt to ED via POV from home. Pt reports fell 3 days and fell onto hands, knees and hit head on ground. Pt was not seen after fall. Pt reports continued head pain. Intermittent blurred vision.
